# Patient Record
Sex: Female | Born: 1935 | Race: White | Hispanic: No | Smoking: Never smoker
Health system: Southern US, Community
[De-identification: ages and names within clinical notes are randomized; demographics above are authoritative.]

## PROBLEM LIST (undated history)

## (undated) DIAGNOSIS — I499 Cardiac arrhythmia, unspecified: Secondary | ICD-10-CM

## (undated) DIAGNOSIS — R778 Other specified abnormalities of plasma proteins: Secondary | ICD-10-CM

## (undated) DIAGNOSIS — Z87442 Personal history of urinary calculi: Secondary | ICD-10-CM

## (undated) DIAGNOSIS — I509 Heart failure, unspecified: Secondary | ICD-10-CM

## (undated) DIAGNOSIS — I4891 Unspecified atrial fibrillation: Principal | ICD-10-CM

## (undated) DIAGNOSIS — N289 Disorder of kidney and ureter, unspecified: Secondary | ICD-10-CM

## (undated) DIAGNOSIS — E119 Type 2 diabetes mellitus without complications: Secondary | ICD-10-CM

## (undated) DIAGNOSIS — I10 Essential (primary) hypertension: Secondary | ICD-10-CM

## (undated) DIAGNOSIS — M1990 Unspecified osteoarthritis, unspecified site: Secondary | ICD-10-CM

## (undated) DIAGNOSIS — G709 Myoneural disorder, unspecified: Secondary | ICD-10-CM

## (undated) DIAGNOSIS — R7989 Other specified abnormal findings of blood chemistry: Secondary | ICD-10-CM

## (undated) HISTORY — PX: LITHOTRIPSY: SUR834

## (undated) HISTORY — PX: PARTIAL HYSTERECTOMY: SHX80

## (undated) HISTORY — PX: CYSTOSCOPY: SHX5120

---
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
## 1995-03-23 HISTORY — PX: BACK SURGERY: SHX140

## 2000-06-20 ENCOUNTER — Inpatient Hospital Stay (HOSPITAL_COMMUNITY): Admission: EM | Admit: 2000-06-20 | Discharge: 2000-06-22 | Payer: Self-pay | Admitting: Emergency Medicine

## 2000-06-20 ENCOUNTER — Encounter: Payer: Self-pay | Admitting: Emergency Medicine

## 2000-06-22 ENCOUNTER — Encounter: Payer: Self-pay | Admitting: Internal Medicine

## 2000-07-06 ENCOUNTER — Ambulatory Visit (HOSPITAL_COMMUNITY): Admission: RE | Admit: 2000-07-06 | Discharge: 2000-07-06 | Payer: Self-pay | Admitting: Urology

## 2000-07-06 ENCOUNTER — Encounter: Payer: Self-pay | Admitting: Urology

## 2000-07-15 ENCOUNTER — Encounter: Payer: Self-pay | Admitting: Urology

## 2000-07-15 ENCOUNTER — Ambulatory Visit (HOSPITAL_COMMUNITY): Admission: RE | Admit: 2000-07-15 | Discharge: 2000-07-15 | Payer: Self-pay | Admitting: Urology

## 2000-11-22 ENCOUNTER — Ambulatory Visit (HOSPITAL_COMMUNITY): Admission: RE | Admit: 2000-11-22 | Discharge: 2000-11-22 | Payer: Self-pay | Admitting: Pulmonary Disease

## 2000-11-22 ENCOUNTER — Encounter: Payer: Self-pay | Admitting: Pulmonary Disease

## 2000-11-30 ENCOUNTER — Ambulatory Visit (HOSPITAL_COMMUNITY): Admission: RE | Admit: 2000-11-30 | Discharge: 2000-11-30 | Payer: Self-pay | Admitting: Pulmonary Disease

## 2000-11-30 ENCOUNTER — Encounter: Payer: Self-pay | Admitting: Pulmonary Disease

## 2000-12-02 ENCOUNTER — Encounter: Payer: Self-pay | Admitting: Urology

## 2000-12-02 ENCOUNTER — Encounter: Admission: RE | Admit: 2000-12-02 | Discharge: 2000-12-02 | Payer: Self-pay | Admitting: Urology

## 2000-12-07 ENCOUNTER — Ambulatory Visit (HOSPITAL_COMMUNITY): Admission: RE | Admit: 2000-12-07 | Discharge: 2000-12-07 | Payer: Self-pay | Admitting: Urology

## 2001-01-11 ENCOUNTER — Encounter: Payer: Self-pay | Admitting: Urology

## 2001-01-11 ENCOUNTER — Ambulatory Visit (HOSPITAL_COMMUNITY): Admission: RE | Admit: 2001-01-11 | Discharge: 2001-01-11 | Payer: Self-pay | Admitting: Urology

## 2001-03-28 ENCOUNTER — Encounter: Admission: RE | Admit: 2001-03-28 | Discharge: 2001-03-28 | Payer: Self-pay | Admitting: Urology

## 2001-03-28 ENCOUNTER — Encounter: Payer: Self-pay | Admitting: Urology

## 2001-04-10 ENCOUNTER — Ambulatory Visit (HOSPITAL_COMMUNITY): Admission: RE | Admit: 2001-04-10 | Discharge: 2001-04-10 | Payer: Self-pay | Admitting: Pulmonary Disease

## 2001-04-10 ENCOUNTER — Encounter: Payer: Self-pay | Admitting: Pulmonary Disease

## 2001-07-24 ENCOUNTER — Ambulatory Visit (HOSPITAL_COMMUNITY): Admission: RE | Admit: 2001-07-24 | Discharge: 2001-07-24 | Payer: Self-pay | Admitting: Pulmonary Disease

## 2001-07-24 ENCOUNTER — Encounter: Payer: Self-pay | Admitting: Pulmonary Disease

## 2002-01-22 ENCOUNTER — Ambulatory Visit (HOSPITAL_COMMUNITY): Admission: RE | Admit: 2002-01-22 | Discharge: 2002-01-22 | Payer: Self-pay | Admitting: Pulmonary Disease

## 2002-01-22 ENCOUNTER — Encounter: Payer: Self-pay | Admitting: Pulmonary Disease

## 2002-07-02 ENCOUNTER — Ambulatory Visit (HOSPITAL_COMMUNITY): Admission: RE | Admit: 2002-07-02 | Discharge: 2002-07-02 | Payer: Self-pay | Admitting: Pulmonary Disease

## 2002-07-02 ENCOUNTER — Encounter: Payer: Self-pay | Admitting: Pulmonary Disease

## 2005-01-13 ENCOUNTER — Ambulatory Visit: Payer: Self-pay | Admitting: Cardiology

## 2005-02-08 ENCOUNTER — Ambulatory Visit: Payer: Self-pay | Admitting: Cardiology

## 2005-06-11 ENCOUNTER — Ambulatory Visit (HOSPITAL_COMMUNITY): Admission: RE | Admit: 2005-06-11 | Discharge: 2005-06-11 | Payer: Self-pay | Admitting: Internal Medicine

## 2005-06-11 ENCOUNTER — Ambulatory Visit: Payer: Self-pay | Admitting: Internal Medicine

## 2010-07-29 ENCOUNTER — Encounter (HOSPITAL_BASED_OUTPATIENT_CLINIC_OR_DEPARTMENT_OTHER): Payer: Medicare Other | Admitting: Internal Medicine

## 2010-07-29 ENCOUNTER — Ambulatory Visit (HOSPITAL_COMMUNITY)
Admission: RE | Admit: 2010-07-29 | Discharge: 2010-07-29 | Disposition: A | Discharge status: Home / Self Care | Payer: Medicare Other | Source: Ambulatory Visit | Attending: Internal Medicine | Admitting: Internal Medicine

## 2010-07-29 ENCOUNTER — Other Ambulatory Visit (INDEPENDENT_AMBULATORY_CARE_PROVIDER_SITE_OTHER): Payer: Self-pay | Admitting: Internal Medicine

## 2010-07-29 DIAGNOSIS — D128 Benign neoplasm of rectum: Secondary | ICD-10-CM

## 2010-07-29 DIAGNOSIS — D126 Benign neoplasm of colon, unspecified: Secondary | ICD-10-CM | POA: Insufficient documentation

## 2010-07-29 DIAGNOSIS — I10 Essential (primary) hypertension: Secondary | ICD-10-CM | POA: Insufficient documentation

## 2010-07-29 DIAGNOSIS — Z79899 Other long term (current) drug therapy: Secondary | ICD-10-CM | POA: Insufficient documentation

## 2010-07-29 DIAGNOSIS — Z86010 Personal history of colonic polyps: Secondary | ICD-10-CM

## 2010-07-29 DIAGNOSIS — Z1211 Encounter for screening for malignant neoplasm of colon: Secondary | ICD-10-CM | POA: Insufficient documentation

## 2010-07-29 DIAGNOSIS — Z800 Family history of malignant neoplasm of digestive organs: Secondary | ICD-10-CM

## 2010-07-29 DIAGNOSIS — K648 Other hemorrhoids: Secondary | ICD-10-CM | POA: Insufficient documentation

## 2010-07-29 DIAGNOSIS — K5730 Diverticulosis of large intestine without perforation or abscess without bleeding: Secondary | ICD-10-CM | POA: Insufficient documentation

## 2010-07-29 DIAGNOSIS — E119 Type 2 diabetes mellitus without complications: Secondary | ICD-10-CM | POA: Insufficient documentation

## 2010-08-17 NOTE — Op Note (Signed)
  NAMETARICA, VANALST              ACCOUNT NO.:  [id number]  MEDICAL RECORD NO.:  [id number]           PATIENT TYPE:  O  LOCATION:  DAYP                          FACILITY:  [facility]  PHYSICIAN:  Hildred Laser, M.D.    DATE OF BIRTH:  1930/09/03  DATE OF PROCEDURE:  07/29/2010 DATE OF DISCHARGE:                              OPERATIVE REPORT   PROCEDURE:  Colonoscopy with snare polypectomy.  INDICATIONS:  Miranda Garcia is a 77 year old Caucasian Garcia who is undergoing high-risk screening colonoscopy.  Her last exam was just over 5 years ago.  Family history is significant for colon carcinoma in a brother who had surgery in his early 62s and died of unrelated causes at age 54. Procedure risks were reviewed with the patient and informed consent was obtained.  MEDICATIONS FOR CONSCIOUS SEDATION:  Demerol 50 mg IV, Versed 4 mg IV.  FINDINGS:  Procedure was performed in an endoscopy suite.  The patient's vital signs and O2 sats were monitored during the procedure and remained stable.  The patient was placed in the left lateral recumbent position. Rectal examination was performed.  No abnormalities were noted on external or digital exam.  Pentax videoscope was placed through the rectum and advanced under direct vision into sigmoid colon beyond. Preparation was excellent.  She had few scattered diverticula at sigmoid colon.  The scope was passed through the cecum which was identified by appendiceal orifice.  The area behind the valve was difficult to see and mucosa pulled with forceps in order to see this area.  Appendiceal orifice well seen and was normal.  As the scope was withdrawn, colonic mucosa was carefully examined.  There was a 7-mm sessile polyp at the sigmoid colon which elevated with saline injection and snared.  Residual polyp was coagulated with snare tip.  These pieces snared were retrieved for histologic examination.  Mucosa rest of the colon was normal. Rectal mucosa similarly was  normal.  Scope was retroflexed to examine anorectal junction and hemorrhoids noted above the dentate line with erythema.  Pictures taken for the record and the endoscope was withdrawn.  Withdrawal time was 12 minutes.  The patient tolerated the procedure well.  FINAL DIAGNOSES: 1. Examination performed to cecum. 2. A 7-mm sessile polyp snared from sigmoid colon. 3. Few scattered diverticula at sigmoid colon. 4. Internal/external hemorrhoids with inflammation.  RECOMMENDATIONS: 1. Standard instructions given.  No aspirin for 1 week. 2. High-fiber diet. 3. Anusol-HC Suppository 1 per rectum at bedtime daily for 2 weeks.  I will be contacting the patient with the results of biopsy.          ______________________________ Hildred Laser, M.D.     NR/MEDQ  D:  07/29/2010  T:  07/29/2010  Job:  [id number]  cc:   Dr. Woody Seller  Electronically Signed by Hildred Laser M.D. on 08/17/2010 [id number] PM

## 2011-01-05 ENCOUNTER — Encounter (HOSPITAL_COMMUNITY)
Admission: RE | Admit: 2011-01-05 | Discharge: 2011-01-05 | Disposition: A | Discharge status: Home / Self Care | Payer: Medicare Other | Source: Ambulatory Visit | Attending: Ophthalmology | Admitting: Ophthalmology

## 2011-01-05 ENCOUNTER — Encounter (HOSPITAL_COMMUNITY): Payer: Self-pay

## 2011-01-05 ENCOUNTER — Other Ambulatory Visit: Payer: Self-pay

## 2011-01-05 HISTORY — DX: Type 2 diabetes mellitus without complications: E11.9

## 2011-01-05 HISTORY — DX: Essential (primary) hypertension: I10

## 2011-01-05 HISTORY — DX: Unspecified osteoarthritis, unspecified site: M19.90

## 2011-01-05 LAB — BASIC METABOLIC PANEL
CO2: 26 mEq/L (ref 19–32)
Glucose, Bld: 68 mg/dL — ABNORMAL LOW (ref 70–99)
Potassium: 4.5 mEq/L (ref 3.5–5.1)
Sodium: 139 mEq/L (ref 135–145)

## 2011-01-05 LAB — CBC
HCT: 33.8 % — ABNORMAL LOW (ref 36.0–46.0)
Hemoglobin: 11 g/dL — ABNORMAL LOW (ref 12.0–15.0)
MCH: 30.6 pg (ref 26.0–34.0)
MCV: 94.2 fL (ref 78.0–100.0)
RBC: 3.59 MIL/uL — ABNORMAL LOW (ref 3.87–5.11)

## 2011-01-05 NOTE — Patient Instructions (Addendum)
20 Miranda Garcia  01/05/2011   Your procedure is scheduled on:  01/11/11  Report to [facility] at 07:30 AM.  Call this number if you have problems the morning of surgery: [phone]   Remember:   Do not eat food:After Midnight.  Do not drink clear liquids: After Midnight.  Take these medicines the morning of surgery with A SIP OF WATER: metoprolol   Do not wear jewelry, make-up or nail polish.  Do not wear lotions, powders, or perfumes. You may wear deodorant.  Do not shave 48 hours prior to surgery.  Do not bring valuables to the hospital.  Contacts, dentures or bridgework may not be worn into surgery.  Leave suitcase in the car. After surgery it may be brought to your room.  For patients admitted to the hospital, checkout time is 11:00 AM the day of discharge.   Patients discharged the day of surgery will not be allowed to drive home.  Name and phone number of your driver: driver  Special Instructions: Use eye drops as instructed.   Please read over the following fact sheets that you were given: Anesthesia Post-op Instructions   Monitored Anesthesia Care (MAC)  MAC stands for monitored anesthesia care. MAC usually means a tube is not put in your trachea (windpipe). MAC may also be called moderate sedation. MAC usually involves giving intravenous anesthetic drugs, oxygen, watching vital signs and standard patient monitoring procedures similar to those used during a general anesthetic. MAC can be done without going to the operating room. MAC is typically used for small procedures that cannot be done with only local anesthesia. MAC usually means lower doses of anesthetic drugs. The recovery period tends to be shorter. The drugs used cause a lower level of awareness. This means you are partially awake and your reflexes are intact. You may hear what is being said and feel some pressure, but should not feel pain. The drugs used may affect your ability to remember the procedure. If you have  depressed consciousness and lose some protective reflexes, this is called deep sedation. If you become unconscious and fall completely asleep, this is general anesthesia. In both deep sedation and general anesthesia, the caregivers must make sure that your airway remains open. During MAC, the sedation-trained caregivers will:  Give medications which may include:   Sedatives.   Analgesics.   Hypnotics.   Other medications which are needed to keep you comfortable, safe and secure.   Give local anesthetic to numb the procedural site.   Monitor your level of consciousness.   Monitor your blood pressure.   Monitor your heart rate and rhythm.   Monitor your respirations and oxygen levels.   Monitor your airway.   Monitor your level of pain.   Evaluate and treat problems which may occur.  Some of this information is from the [employer]. Document Released: 12/02/2004 Document Re-Released: 03/30/2009 [facility] Patient Information 2011 [place].

## 2011-01-11 ENCOUNTER — Ambulatory Visit (HOSPITAL_COMMUNITY): Payer: Medicare Other | Admitting: Anesthesiology

## 2011-01-11 ENCOUNTER — Encounter (HOSPITAL_COMMUNITY): Payer: Self-pay | Admitting: Ophthalmology

## 2011-01-11 ENCOUNTER — Encounter (HOSPITAL_COMMUNITY): Payer: Self-pay | Admitting: Anesthesiology

## 2011-01-11 ENCOUNTER — Encounter (HOSPITAL_COMMUNITY)
Admission: RE | Disposition: A | Discharge status: Home / Self Care | Payer: Self-pay | Source: Ambulatory Visit | Attending: Ophthalmology

## 2011-01-11 ENCOUNTER — Ambulatory Visit (HOSPITAL_COMMUNITY)
Admission: RE | Admit: 2011-01-11 | Discharge: 2011-01-11 | Disposition: A | Discharge status: Home / Self Care | Payer: Medicare Other | Source: Ambulatory Visit | Attending: Ophthalmology | Admitting: Ophthalmology

## 2011-01-11 DIAGNOSIS — E119 Type 2 diabetes mellitus without complications: Secondary | ICD-10-CM | POA: Insufficient documentation

## 2011-01-11 DIAGNOSIS — Z01810 Encounter for preprocedural cardiovascular examination: Secondary | ICD-10-CM | POA: Insufficient documentation

## 2011-01-11 DIAGNOSIS — I10 Essential (primary) hypertension: Secondary | ICD-10-CM | POA: Insufficient documentation

## 2011-01-11 DIAGNOSIS — Z01812 Encounter for preprocedural laboratory examination: Secondary | ICD-10-CM | POA: Insufficient documentation

## 2011-01-11 DIAGNOSIS — Z79899 Other long term (current) drug therapy: Secondary | ICD-10-CM | POA: Insufficient documentation

## 2011-01-11 DIAGNOSIS — H2589 Other age-related cataract: Secondary | ICD-10-CM | POA: Insufficient documentation

## 2011-01-11 HISTORY — PX: CATARACT EXTRACTION W/PHACO: SHX586

## 2011-01-11 SURGERY — PHACOEMULSIFICATION, CATARACT, WITH IOL INSERTION
Anesthesia: Monitor Anesthesia Care | Site: Eye | Laterality: Right | Wound class: Clean

## 2011-01-11 MED ORDER — LIDOCAINE HCL (PF) 1 % IJ SOLN
INTRAMUSCULAR | Status: DC | PRN
  Administered 2011-01-11: .5 mL

## 2011-01-11 MED ORDER — POVIDONE-IODINE 5 % OP SOLN
OPHTHALMIC | Status: DC | PRN
  Administered 2011-01-11: 1 via OPHTHALMIC

## 2011-01-11 MED ORDER — LIDOCAINE HCL 3.5 % OP GEL
1.0000 "application " | Freq: Once | OPHTHALMIC | Status: AC
  Administered 2011-01-11: 1 via OPHTHALMIC

## 2011-01-11 MED ORDER — MIDAZOLAM HCL 2 MG/2ML IJ SOLN
INTRAMUSCULAR | Status: AC
  Filled 2011-01-11: qty 2

## 2011-01-11 MED ORDER — NEOMYCIN-POLYMYXIN-DEXAMETH 0.1 % OP OINT
TOPICAL_OINTMENT | OPHTHALMIC | Status: DC | PRN
  Administered 2011-01-11: 1 via OPHTHALMIC

## 2011-01-11 MED ORDER — CYCLOPENTOLATE-PHENYLEPHRINE 0.2-1 % OP SOLN
OPHTHALMIC | Status: AC
  Administered 2011-01-11: 1 [drp] via OPHTHALMIC
  Filled 2011-01-11: qty 2

## 2011-01-11 MED ORDER — TETRACAINE HCL 0.5 % OP SOLN
OPHTHALMIC | Status: AC
  Administered 2011-01-11: 1 [drp] via OPHTHALMIC
  Filled 2011-01-11: qty 2

## 2011-01-11 MED ORDER — LACTATED RINGERS IV SOLN: INTRAVENOUS | Status: DC

## 2011-01-11 MED ORDER — PHENYLEPHRINE HCL 2.5 % OP SOLN
OPHTHALMIC | Status: AC
  Administered 2011-01-11: 1 [drp] via OPHTHALMIC
  Filled 2011-01-11: qty 2

## 2011-01-11 MED ORDER — EPINEPHRINE HCL 1 MG/ML IJ SOLN
INTRAOCULAR | Status: DC | PRN
  Administered 2011-01-11: 09:00:00

## 2011-01-11 MED ORDER — LACTATED RINGERS IV SOLN
INTRAVENOUS | Status: DC
  Administered 2011-01-11: 08:00:00 via INTRAVENOUS

## 2011-01-11 MED ORDER — BSS IO SOLN
INTRAOCULAR | Status: DC | PRN
  Administered 2011-01-11: 15 mL via OPHTHALMIC

## 2011-01-11 MED ORDER — LIDOCAINE HCL (PF) 1 % IJ SOLN
INTRAMUSCULAR | Status: AC
  Filled 2011-01-11: qty 2

## 2011-01-11 MED ORDER — CYCLOPENTOLATE-PHENYLEPHRINE 0.2-1 % OP SOLN
1.0000 [drp] | OPHTHALMIC | Status: AC
  Administered 2011-01-11 (×3): 1 [drp] via OPHTHALMIC

## 2011-01-11 MED ORDER — PHENYLEPHRINE HCL 2.5 % OP SOLN
1.0000 [drp] | OPHTHALMIC | Status: AC
  Administered 2011-01-11 (×3): 1 [drp] via OPHTHALMIC

## 2011-01-11 MED ORDER — TETRACAINE HCL 0.5 % OP SOLN
1.0000 [drp] | OPHTHALMIC | Status: AC
  Administered 2011-01-11 (×3): 1 [drp] via OPHTHALMIC

## 2011-01-11 MED ORDER — LIDOCAINE 3.5 % OP GEL OPTIME - NO CHARGE
OPHTHALMIC | Status: DC | PRN
  Administered 2011-01-11: 1 [drp] via OPHTHALMIC

## 2011-01-11 MED ORDER — PROVISC 10 MG/ML IO SOLN
INTRAOCULAR | Status: DC | PRN
  Administered 2011-01-11: 8.5 mg via OPHTHALMIC

## 2011-01-11 MED ORDER — MIDAZOLAM HCL 2 MG/2ML IJ SOLN
1.0000 mg | INTRAMUSCULAR | Status: DC | PRN
  Administered 2011-01-11: 2 mg via INTRAVENOUS

## 2011-01-11 MED ORDER — EPINEPHRINE HCL 1 MG/ML IJ SOLN
INTRAMUSCULAR | Status: AC
  Filled 2011-01-11: qty 1

## 2011-01-11 MED ORDER — LIDOCAINE HCL 3.5 % OP GEL
OPHTHALMIC | Status: AC
  Administered 2011-01-11: 1 via OPHTHALMIC
  Filled 2011-01-11: qty 5

## 2011-01-11 MED ORDER — NEOMYCIN-POLYMYXIN-DEXAMETH 3.5-10000-0.1 OP OINT
TOPICAL_OINTMENT | OPHTHALMIC | Status: AC
  Filled 2011-01-11: qty 3.5

## 2011-01-11 MED ORDER — FENTANYL CITRATE 0.05 MG/ML IJ SOLN: 25.0000 ug | INTRAMUSCULAR | Status: DC | PRN

## 2011-01-11 MED ORDER — ONDANSETRON HCL 4 MG/2ML IJ SOLN: 4.0000 mg | Freq: Once | INTRAMUSCULAR | Status: DC | PRN

## 2011-01-11 SURGICAL SUPPLY — 34 items
CAPSULAR TENSION RING-AMO (OPHTHALMIC RELATED) IMPLANT
CLOTH BEACON ORANGE TIMEOUT ST (SAFETY) ×1 IMPLANT
DUOVISC SYSTEM (INTRAOCULAR LENS)
EYE SHIELD UNIVERSAL CLEAR (GAUZE/BANDAGES/DRESSINGS) ×1 IMPLANT
GLOVE BIO SURGEON STRL SZ 6.5 (GLOVE) IMPLANT
GLOVE BIOGEL PI IND STRL 6.5 (GLOVE) IMPLANT
GLOVE BIOGEL PI IND STRL 7.0 (GLOVE) IMPLANT
GLOVE BIOGEL PI IND STRL 7.5 (GLOVE) IMPLANT
GLOVE BIOGEL PI INDICATOR 6.5 (GLOVE)
GLOVE BIOGEL PI INDICATOR 7.0 (GLOVE) ×1
GLOVE BIOGEL PI INDICATOR 7.5 (GLOVE)
GLOVE ECLIPSE 6.5 STRL STRAW (GLOVE) IMPLANT
GLOVE ECLIPSE 7.0 STRL STRAW (GLOVE) IMPLANT
GLOVE ECLIPSE 7.5 STRL STRAW (GLOVE) IMPLANT
GLOVE EXAM NITRILE LRG STRL (GLOVE) ×1 IMPLANT
GLOVE EXAM NITRILE MD LF STRL (GLOVE) IMPLANT
GLOVE SKINSENSE NS SZ6.5 (GLOVE)
GLOVE SKINSENSE NS SZ7.0 (GLOVE)
GLOVE SKINSENSE STRL SZ6.5 (GLOVE) IMPLANT
GLOVE SKINSENSE STRL SZ7.0 (GLOVE) IMPLANT
KIT VITRECTOMY (OPHTHALMIC RELATED) IMPLANT
PAD ARMBOARD 7.5X6 YLW CONV (MISCELLANEOUS) ×1 IMPLANT
PROC W NO LENS (INTRAOCULAR LENS)
PROC W SPEC LENS (INTRAOCULAR LENS)
PROCESS W NO LENS (INTRAOCULAR LENS) IMPLANT
PROCESS W SPEC LENS (INTRAOCULAR LENS) IMPLANT
RING MALYGIN (MISCELLANEOUS) IMPLANT
SIGHTPATH CAT PROC W REG LENS (Ophthalmic Related) ×2 IMPLANT
SYR TB 1ML LL NO SAFETY (SYRINGE) ×1 IMPLANT
SYSTEM DUOVISC (INTRAOCULAR LENS) IMPLANT
TAPE SURG TRANSPORE 1 IN (GAUZE/BANDAGES/DRESSINGS) IMPLANT
TAPE SURGICAL TRANSPORE 1 IN (GAUZE/BANDAGES/DRESSINGS) ×1
VISCOELASTIC ADDITIONAL (OPHTHALMIC RELATED) IMPLANT
WATER STERILE IRR 250ML POUR (IV SOLUTION) ×1 IMPLANT

## 2011-01-11 NOTE — H&P (Signed)
I have reviewed the H&P, the patient was re-examined, and I have identified no interval changes in medical condition and plan of care since the history and physical of record  

## 2011-01-11 NOTE — Anesthesia Preprocedure Evaluation (Addendum)
Anesthesia Evaluation  Patient identified by MRN, date of birth, ID band Patient awake  General Assessment Comment  Reviewed: Allergy & Precautions, H&P , NPO status , Patient's Chart, lab work & pertinent test results, reviewed documented beta blocker date and time   Airway Mallampati: III TM Distance: <3 FB     Dental  (+) Teeth Intact   Pulmonary  clear to auscultation        Cardiovascular hypertension, Pt. on medications and Pt. on home beta blockers Regular     Neuro/Psych    GI/Hepatic   Endo/Other  Diabetes mellitus-, Well Controlled, Type 2, Oral Hypoglycemic Agents  Renal/GU      Musculoskeletal   Abdominal   Peds  Hematology   Anesthesia Other Findings   Reproductive/Obstetrics                          Anesthesia Physical Anesthesia Plan  ASA: II  Anesthesia Plan: MAC   Post-op Pain Management:    Induction:   Airway Management Planned: Nasal Cannula  Additional Equipment:   Intra-op Plan:   Post-operative Plan:   Informed Consent: I have reviewed the patients History and Physical, chart, labs and discussed the procedure including the risks, benefits and alternatives for the proposed anesthesia with the patient or authorized representative who has indicated his/her understanding and acceptance.     Plan Discussed with:   Anesthesia Plan Comments:         Anesthesia Quick Evaluation

## 2011-01-11 NOTE — Op Note (Signed)
NAMEWANDY, READ NO.:  [id number]  MEDICAL RECORD NO.:  [id number]  LOCATION:  APPO                          FACILITY:  [facility]  PHYSICIAN:  Richardo Hanks, MD       DATE OF BIRTH:  [birth year]  DATE OF PROCEDURE:  01/11/2011 DATE OF DISCHARGE:  01/11/2011                              OPERATIVE REPORT   PREOPERATIVE DIAGNOSIS:  Combined cataract, right eye.  Diagnosis code 366.19.  POSTOPERATIVE DIAGNOSIS:  Combined cataract, right eye.  Diagnosis code 366.19.  OPERATION PERFORMED:  Phacoemulsification with posterior chamber intraocular lens implantation, right eye.  SURGEON:  Franky Macho. Raelie Lohr, MD  ANESTHESIA:  General endotracheal anesthesia.  OPERATIVE SUMMARY:  In the preoperative area, dilating drops were placed into the right eye.  The patient was then brought into the operating room where she was placed under general anesthesia.  The eye was then prepped and draped.  Beginning with a 75 blade, a paracentesis port was made at the surgeon's 2 o'clock position.  The anterior chamber was then filled with a 1% nonpreserved lidocaine solution with epinephrine.  This was followed by Viscoat to deepen the chamber.  A small fornix-based peritomy was performed superiorly.  Next, a single iris hook was placed through the limbus superiorly.  A 2.4-mm keratome blade was then used to make a clear corneal incision over the iris hook.  A bent cystotome needle and Utrata forceps were used to create a continuous tear capsulotomy.  Hydrodissection was performed using balanced salt solution on a fine cannula.  The lens nucleus was then removed using phacoemulsification in a quadrant cracking technique.  The cortical material was then removed with irrigation and aspiration.  The capsular bag and anterior chamber were refilled with Provisc.  The wound was widened to approximately 3 mm and a posterior chamber intraocular lens was placed into the capsular bag without  difficulty using an [employer] lens injecting system.  A single 10-0 nylon suture was then used to close the incision as well as stromal hydration.  The Provisc was removed from the anterior chamber and capsular bag with irrigation and aspiration.  At this point, the wounds were tested for leak, which were negative.  The anterior chamber remained deep and stable.  The patient tolerated the procedure well.  There were no operative complications, and she awoke from general anesthesia without problem.  No surgical specimens.  Prosthetic device used is a Lenstec posterior chamber lens, model Softec HD, power of 25.0, serial number is [id number].          ______________________________ Richardo Hanks, MD     KEH/MEDQ  D:  01/11/2011  T:  01/11/2011  Job:  [id number]

## 2011-01-11 NOTE — Anesthesia Postprocedure Evaluation (Signed)
  Anesthesia Post-op Note  Patient: Miranda Garcia  Procedure(s) Performed:  CATARACT EXTRACTION PHACO AND INTRAOCULAR LENS PLACEMENT (IOC) - CDE 19.32  Patient Location: PACU and Short Stay  Anesthesia Type: MAC  Level of Consciousness: awake  Airway and Oxygen Therapy: Patient Spontanous Breathing  Post-op Pain: none  Post-op Assessment: Post-op Vital signs reviewed  Post-op Vital Signs: Reviewed and stable  Complications: No apparent anesthesia complications

## 2011-01-11 NOTE — Brief Op Note (Signed)
Pre-Op Dx: Cataract OD Post-Op Dx: Cataract OD Surgeon: Hani Patnode Anesthesia: Topical with MAC Implant: Lenstec, Model Softec HD Blood Loss: None Specimen: None Complications: None 

## 2011-01-11 NOTE — Transfer of Care (Signed)
Immediate Anesthesia Transfer of Care Note  Patient: Miranda Garcia  Procedure(s) Performed:  CATARACT EXTRACTION PHACO AND INTRAOCULAR LENS PLACEMENT (IOC) - CDE 19.32  Patient Location: PACU and Short Stay  Anesthesia Type: MAC  Level of Consciousness: awake  Airway & Oxygen Therapy: Patient Spontanous Breathing  Post-op Assessment: Report given to PACU RN  Post vital signs: Reviewed and stable  Complications: No apparent anesthesia complications

## 2011-01-15 ENCOUNTER — Encounter (HOSPITAL_COMMUNITY): Payer: Self-pay | Admitting: Ophthalmology

## 2011-01-20 ENCOUNTER — Encounter (HOSPITAL_COMMUNITY)
Admission: RE | Admit: 2011-01-20 | Discharge: 2011-01-20 | Disposition: A | Discharge status: Home / Self Care | Payer: Medicare Other | Source: Ambulatory Visit | Attending: Ophthalmology | Admitting: Ophthalmology

## 2011-01-21 ENCOUNTER — Encounter (HOSPITAL_COMMUNITY): Payer: Self-pay | Admitting: Anesthesiology

## 2011-01-21 ENCOUNTER — Ambulatory Visit (HOSPITAL_COMMUNITY)
Admission: RE | Admit: 2011-01-21 | Discharge: 2011-01-21 | Disposition: A | Discharge status: Home / Self Care | Payer: Medicare Other | Source: Ambulatory Visit | Attending: Ophthalmology | Admitting: Ophthalmology

## 2011-01-21 ENCOUNTER — Encounter (HOSPITAL_COMMUNITY): Payer: Self-pay | Admitting: *Deleted

## 2011-01-21 ENCOUNTER — Ambulatory Visit (HOSPITAL_COMMUNITY): Payer: Medicare Other | Admitting: Anesthesiology

## 2011-01-21 ENCOUNTER — Encounter (HOSPITAL_COMMUNITY)
Admission: RE | Disposition: A | Discharge status: Home / Self Care | Payer: Self-pay | Source: Ambulatory Visit | Attending: Ophthalmology

## 2011-01-21 DIAGNOSIS — E119 Type 2 diabetes mellitus without complications: Secondary | ICD-10-CM | POA: Insufficient documentation

## 2011-01-21 DIAGNOSIS — H2589 Other age-related cataract: Secondary | ICD-10-CM | POA: Insufficient documentation

## 2011-01-21 DIAGNOSIS — I10 Essential (primary) hypertension: Secondary | ICD-10-CM | POA: Insufficient documentation

## 2011-01-21 DIAGNOSIS — Z79899 Other long term (current) drug therapy: Secondary | ICD-10-CM | POA: Insufficient documentation

## 2011-01-21 DIAGNOSIS — Z01812 Encounter for preprocedural laboratory examination: Secondary | ICD-10-CM | POA: Insufficient documentation

## 2011-01-21 HISTORY — PX: CATARACT EXTRACTION W/PHACO: SHX586

## 2011-01-21 LAB — GLUCOSE, CAPILLARY

## 2011-01-21 SURGERY — PHACOEMULSIFICATION, CATARACT, WITH IOL INSERTION
Anesthesia: Monitor Anesthesia Care | Site: Eye | Laterality: Left | Wound class: Clean

## 2011-01-21 MED ORDER — LIDOCAINE HCL 1 % IJ SOLN
INTRAMUSCULAR | Status: DC | PRN
  Administered 2011-01-21 (×2): 25 mg via INTRADERMAL

## 2011-01-21 MED ORDER — BSS IO SOLN
INTRAOCULAR | Status: DC | PRN
  Administered 2011-01-21: 15 mL via INTRAOCULAR

## 2011-01-21 MED ORDER — PHENYLEPHRINE HCL 2.5 % OP SOLN
1.0000 [drp] | OPHTHALMIC | Status: AC
  Administered 2011-01-21 (×3): 1 [drp] via OPHTHALMIC

## 2011-01-21 MED ORDER — PHENYLEPHRINE HCL 2.5 % OP SOLN
OPHTHALMIC | Status: AC
  Administered 2011-01-21: 1 [drp] via OPHTHALMIC
  Filled 2011-01-21: qty 2

## 2011-01-21 MED ORDER — EPINEPHRINE HCL 1 MG/ML IJ SOLN
INTRAMUSCULAR | Status: AC
  Filled 2011-01-21: qty 1

## 2011-01-21 MED ORDER — MIDAZOLAM HCL 2 MG/2ML IJ SOLN
INTRAMUSCULAR | Status: AC
  Administered 2011-01-21: 2 mg via INTRAVENOUS
  Filled 2011-01-21: qty 2

## 2011-01-21 MED ORDER — LIDOCAINE HCL 3.5 % OP GEL
OPHTHALMIC | Status: AC
  Administered 2011-01-21: 1 via OPHTHALMIC
  Filled 2011-01-21: qty 5

## 2011-01-21 MED ORDER — NEOMYCIN-POLYMYXIN-DEXAMETH 3.5-10000-0.1 OP OINT
TOPICAL_OINTMENT | OPHTHALMIC | Status: AC
  Filled 2011-01-21: qty 3.5

## 2011-01-21 MED ORDER — CYCLOPENTOLATE-PHENYLEPHRINE 0.2-1 % OP SOLN
OPHTHALMIC | Status: AC
  Administered 2011-01-21: 1 [drp] via OPHTHALMIC
  Filled 2011-01-21: qty 2

## 2011-01-21 MED ORDER — LACTATED RINGERS IV SOLN
INTRAVENOUS | Status: DC
  Administered 2011-01-21: 14:00:00 via INTRAVENOUS

## 2011-01-21 MED ORDER — LIDOCAINE HCL (PF) 1 % IJ SOLN
INTRAMUSCULAR | Status: AC
  Filled 2011-01-21: qty 2

## 2011-01-21 MED ORDER — MIDAZOLAM HCL 2 MG/2ML IJ SOLN
INTRAMUSCULAR | Status: AC
  Filled 2011-01-21: qty 2

## 2011-01-21 MED ORDER — PROVISC 10 MG/ML IO SOLN
INTRAOCULAR | Status: DC | PRN
  Administered 2011-01-21: 8.5 mg via INTRAOCULAR

## 2011-01-21 MED ORDER — TETRACAINE HCL 0.5 % OP SOLN
OPHTHALMIC | Status: AC
  Administered 2011-01-21: 1 [drp] via OPHTHALMIC
  Filled 2011-01-21: qty 2

## 2011-01-21 MED ORDER — TETRACAINE HCL 0.5 % OP SOLN
1.0000 [drp] | OPHTHALMIC | Status: AC
  Administered 2011-01-21 (×3): 1 [drp] via OPHTHALMIC

## 2011-01-21 MED ORDER — LIDOCAINE 3.5 % OP GEL OPTIME - NO CHARGE
OPHTHALMIC | Status: DC | PRN
Start: 2011-01-21 — End: 2011-01-21
  Administered 2011-01-21: 1 [drp] via OPHTHALMIC

## 2011-01-21 MED ORDER — LIDOCAINE HCL (PF) 1 % IJ SOLN
INTRAMUSCULAR | Status: AC
  Filled 2011-01-21: qty 5

## 2011-01-21 MED ORDER — MIDAZOLAM HCL 2 MG/2ML IJ SOLN
1.0000 mg | INTRAMUSCULAR | Status: DC | PRN
  Administered 2011-01-21 (×2): 2 mg via INTRAVENOUS

## 2011-01-21 MED ORDER — LIDOCAINE HCL (PF) 1 % IJ SOLN
INTRAMUSCULAR | Status: DC | PRN
Start: 2011-01-21 — End: 2011-01-21
  Administered 2011-01-21: .3 mL

## 2011-01-21 MED ORDER — EPINEPHRINE HCL 1 MG/ML IJ SOLN
INTRAOCULAR | Status: DC | PRN
  Administered 2011-01-21: 14:00:00

## 2011-01-21 MED ORDER — LIDOCAINE HCL 3.5 % OP GEL
1.0000 "application " | Freq: Once | OPHTHALMIC | Status: AC
  Administered 2011-01-21: 1 via OPHTHALMIC

## 2011-01-21 MED ORDER — CYCLOPENTOLATE-PHENYLEPHRINE 0.2-1 % OP SOLN
1.0000 [drp] | OPHTHALMIC | Status: AC
  Administered 2011-01-21 (×3): 1 [drp] via OPHTHALMIC

## 2011-01-21 MED ORDER — POVIDONE-IODINE 5 % OP SOLN
OPHTHALMIC | Status: DC | PRN
  Administered 2011-01-21: 1 via OPHTHALMIC

## 2011-01-21 SURGICAL SUPPLY — 34 items
CAPSULAR TENSION RING-AMO (OPHTHALMIC RELATED) IMPLANT
CLOTH BEACON ORANGE TIMEOUT ST (SAFETY) ×1 IMPLANT
DUOVISC SYSTEM (INTRAOCULAR LENS)
EYE SHIELD UNIVERSAL CLEAR (GAUZE/BANDAGES/DRESSINGS) ×1 IMPLANT
GLOVE BIO SURGEON STRL SZ 6.5 (GLOVE) IMPLANT
GLOVE BIOGEL PI IND STRL 6.5 (GLOVE) IMPLANT
GLOVE BIOGEL PI IND STRL 7.0 (GLOVE) IMPLANT
GLOVE BIOGEL PI IND STRL 7.5 (GLOVE) IMPLANT
GLOVE BIOGEL PI INDICATOR 6.5 (GLOVE) ×1
GLOVE BIOGEL PI INDICATOR 7.0 (GLOVE)
GLOVE BIOGEL PI INDICATOR 7.5 (GLOVE)
GLOVE ECLIPSE 6.5 STRL STRAW (GLOVE) IMPLANT
GLOVE ECLIPSE 7.0 STRL STRAW (GLOVE) IMPLANT
GLOVE ECLIPSE 7.5 STRL STRAW (GLOVE) IMPLANT
GLOVE EXAM NITRILE LRG STRL (GLOVE) ×1 IMPLANT
GLOVE EXAM NITRILE MD LF STRL (GLOVE) IMPLANT
GLOVE SKINSENSE NS SZ6.5 (GLOVE)
GLOVE SKINSENSE NS SZ7.0 (GLOVE)
GLOVE SKINSENSE STRL SZ6.5 (GLOVE) IMPLANT
GLOVE SKINSENSE STRL SZ7.0 (GLOVE) IMPLANT
KIT VITRECTOMY (OPHTHALMIC RELATED) IMPLANT
PAD ARMBOARD 7.5X6 YLW CONV (MISCELLANEOUS) ×1 IMPLANT
PROC W NO LENS (INTRAOCULAR LENS)
PROC W SPEC LENS (INTRAOCULAR LENS)
PROCESS W NO LENS (INTRAOCULAR LENS) IMPLANT
PROCESS W SPEC LENS (INTRAOCULAR LENS) IMPLANT
RING MALYGIN (MISCELLANEOUS) IMPLANT
SIGHTPATH CAT PROC W REG LENS (Ophthalmic Related) ×2 IMPLANT
SYR TB 1ML LL NO SAFETY (SYRINGE) ×1 IMPLANT
SYSTEM DUOVISC (INTRAOCULAR LENS) IMPLANT
TAPE SURG TRANSPORE 1 IN (GAUZE/BANDAGES/DRESSINGS) IMPLANT
TAPE SURGICAL TRANSPORE 1 IN (GAUZE/BANDAGES/DRESSINGS) ×1
VISCOELASTIC ADDITIONAL (OPHTHALMIC RELATED) IMPLANT
WATER STERILE IRR 250ML POUR (IV SOLUTION) ×1 IMPLANT

## 2011-01-21 NOTE — Anesthesia Postprocedure Evaluation (Signed)
  Anesthesia Post-op Note  Patient: Miranda Garcia  Procedure(s) Performed:  CATARACT EXTRACTION PHACO AND INTRAOCULAR LENS PLACEMENT (IOC) - CDE: 21.19  Patient Location: PACU and Short Stay  Anesthesia Type: MAC  Level of Consciousness: awake, alert , oriented and patient cooperative  Airway and Oxygen Therapy: Patient Spontanous Breathing  Post-op Pain: none  Post-op Assessment: Post-op Vital signs reviewed, Patient's Cardiovascular Status Stable, Respiratory Function Stable, Patent Airway and No signs of Nausea or vomiting  Post-op Vital Signs: Reviewed and stable  Complications: No apparent anesthesia complications

## 2011-01-21 NOTE — Anesthesia Preprocedure Evaluation (Addendum)
Anesthesia Evaluation  Patient identified by MRN, date of birth, ID band Patient awake  General Assessment Comment  Reviewed: Allergy & Precautions, H&P , NPO status , Patient's Chart, lab work & pertinent test results, reviewed documented beta blocker date and time   Airway Mallampati: III TM Distance: <3 FB     Dental  (+) Teeth Intact   Pulmonary  clear to auscultation        Cardiovascular hypertension, Pt. on medications and Pt. on home beta blockers Regular     Neuro/Psych    GI/Hepatic   Endo/Other  Diabetes mellitus-, Well Controlled, Type 2, Oral Hypoglycemic Agents  Renal/GU      Musculoskeletal   Abdominal   Peds  Hematology   Anesthesia Other Findings   Reproductive/Obstetrics                           Anesthesia Physical Anesthesia Plan  ASA: II  Anesthesia Plan: MAC   Post-op Pain Management:    Induction:   Airway Management Planned: Nasal Cannula  Additional Equipment:   Intra-op Plan:   Post-operative Plan:   Informed Consent: I have reviewed the patients History and Physical, chart, labs and discussed the procedure including the risks, benefits and alternatives for the proposed anesthesia with the patient or authorized representative who has indicated his/her understanding and acceptance.     Plan Discussed with:   Anesthesia Plan Comments:         Anesthesia Quick Evaluation

## 2011-01-21 NOTE — H&P (Signed)
I have reviewed the H&P, the patient was re-examined, and I have identified no interval changes in medical condition and plan of care since the history and physical of record  

## 2011-01-21 NOTE — Transfer of Care (Signed)
Immediate Anesthesia Transfer of Care Note  Patient: Miranda Garcia  Procedure(s) Performed:  CATARACT EXTRACTION PHACO AND INTRAOCULAR LENS PLACEMENT (IOC) - CDE: 21.19  Patient Location: PACU and Short Stay  Anesthesia Type: MAC  Level of Consciousness: awake, alert , oriented and patient cooperative  Airway & Oxygen Therapy: Patient Spontanous Breathing  Post-op Assessment: Report given to PACU RN, Post -op Vital signs reviewed and stable and Patient moving all extremities  Post vital signs: Reviewed and stable  Complications: No apparent anesthesia complications

## 2011-01-21 NOTE — Brief Op Note (Signed)
Pre-Op Dx: Cataract OS Post-Op Dx: Cataract OS Surgeon: Tonny Branch Anesthesia: Topical with MAC Implant: Lenstec, Model Softec HD Specimen: None Complications: None

## 2011-01-22 MED ORDER — NEOMYCIN-POLYMYXIN-DEXAMETH 0.1 % OP OINT
TOPICAL_OINTMENT | OPHTHALMIC | Status: AC | PRN
  Administered 2011-01-21: 1 via OPHTHALMIC

## 2011-01-22 NOTE — Op Note (Signed)
[name], Miranda Garcia NO.:  [id number]  MEDICAL RECORD NO.:  [id number]  LOCATION:  APPO                          FACILITY:  [facility]  PHYSICIAN:  Richardo Hanks, MD       DATE OF BIRTH:  December 05, 1930  DATE OF PROCEDURE:  01/21/2011 DATE OF DISCHARGE:  01/21/2011                              OPERATIVE REPORT   PREOPERATIVE DIAGNOSIS:  Combined cataract, left eye.  Diagnosis code 366.19.  POSTOPERATIVE DIAGNOSIS:  Combined cataract, left eye.  Diagnosis code 366.19.  OPERATION PERFORMED:  Phacoemulsification with posterior chamber intraocular lens implantation, left eye.  SURGEON:  Richardo Hanks, MD.  ANESTHESIA:  General endotracheal anesthesia.  OPERATIVE SUMMARY:  In the preoperative area, dilating drops were placed into the left eye.  The patient was then brought into the operating room where she was placed under general anesthesia.  The eye was then prepped and draped.  Beginning with a 75 blade, a paracentesis port was made at the surgeon's 2 o'clock position.  The anterior chamber was then filled with a 1% nonpreserved lidocaine solution with epinephrine.  This was followed by Viscoat to deepen the chamber.  A small fornix-based peritomy was performed superiorly.  Next, a single iris hook was placed through the limbus superiorly.  A 2.4-mm keratome blade was then used to make a clear corneal incision over the iris hook.  A bent cystotome needle and Utrata forceps were used to create a continuous tear capsulotomy.  Hydrodissection was performed using balanced salt solution on a fine cannula.  The lens nucleus was then removed using phacoemulsification in a quadrant cracking technique.  The cortical material was then removed with irrigation and aspiration.  The capsular bag and anterior chamber were refilled with Provisc.  The wound was widened to approximately 3 mm and a posterior chamber intraocular lens was placed into the capsular bag without difficulty  using an [employer] lens injecting system.  A single 10-0 nylon suture was then used to close the incision as well as stromal hydration.  The Provisc was removed from the anterior chamber and capsular bag with irrigation and aspiration.  At this point, the wounds were tested for leak, which were negative.  The anterior chamber remained deep and stable.  The patient tolerated the procedure well.  There were no operative complications, and she awoke from general anesthesia without problem.  No surgical specimens.  Prosthetic device used is a Lenstec posterior chamber lens, model Softec HD, power of 25.5, serial number is [id number].          ______________________________ Richardo Hanks, MD     KEH/MEDQ  D:  01/21/2011  T:  01/22/2011  Job:  [id number]

## 2011-01-27 ENCOUNTER — Encounter (HOSPITAL_COMMUNITY): Payer: Self-pay | Admitting: Ophthalmology

## 2012-02-25 ENCOUNTER — Inpatient Hospital Stay (HOSPITAL_COMMUNITY)
Admission: EM | Admit: 2012-02-25 | Discharge: 2012-02-29 | DRG: 281 | Disposition: A | Discharge status: Home / Self Care | Payer: Medicare Other | Attending: Internal Medicine | Admitting: Internal Medicine

## 2012-02-25 ENCOUNTER — Emergency Department (HOSPITAL_COMMUNITY): Payer: Medicare Other

## 2012-02-25 ENCOUNTER — Encounter (HOSPITAL_COMMUNITY): Payer: Self-pay | Admitting: Emergency Medicine

## 2012-02-25 DIAGNOSIS — N179 Acute kidney failure, unspecified: Secondary | ICD-10-CM

## 2012-02-25 DIAGNOSIS — R7989 Other specified abnormal findings of blood chemistry: Secondary | ICD-10-CM

## 2012-02-25 DIAGNOSIS — E119 Type 2 diabetes mellitus without complications: Secondary | ICD-10-CM

## 2012-02-25 DIAGNOSIS — R079 Chest pain, unspecified: Secondary | ICD-10-CM

## 2012-02-25 DIAGNOSIS — I5032 Chronic diastolic (congestive) heart failure: Secondary | ICD-10-CM

## 2012-02-25 DIAGNOSIS — I509 Heart failure, unspecified: Secondary | ICD-10-CM

## 2012-02-25 DIAGNOSIS — D72829 Elevated white blood cell count, unspecified: Secondary | ICD-10-CM

## 2012-02-25 DIAGNOSIS — I214 Non-ST elevation (NSTEMI) myocardial infarction: Secondary | ICD-10-CM | POA: Diagnosis present

## 2012-02-25 DIAGNOSIS — I4891 Unspecified atrial fibrillation: Principal | ICD-10-CM

## 2012-02-25 DIAGNOSIS — D649 Anemia, unspecified: Secondary | ICD-10-CM | POA: Diagnosis present

## 2012-02-25 DIAGNOSIS — N183 Chronic kidney disease, stage 3 unspecified: Secondary | ICD-10-CM

## 2012-02-25 DIAGNOSIS — N189 Chronic kidney disease, unspecified: Secondary | ICD-10-CM

## 2012-02-25 DIAGNOSIS — D638 Anemia in other chronic diseases classified elsewhere: Secondary | ICD-10-CM

## 2012-02-25 DIAGNOSIS — N184 Chronic kidney disease, stage 4 (severe): Secondary | ICD-10-CM | POA: Diagnosis present

## 2012-02-25 HISTORY — DX: Other specified abnormalities of plasma proteins: R77.8

## 2012-02-25 HISTORY — DX: Other specified abnormal findings of blood chemistry: R79.89

## 2012-02-25 HISTORY — DX: Unspecified atrial fibrillation: I48.91

## 2012-02-25 LAB — CBC WITH DIFFERENTIAL/PLATELET
Eosinophils Relative: 0 % (ref 0–5)
HCT: 32.7 % — ABNORMAL LOW (ref 36.0–46.0)
Hemoglobin: 11 g/dL — ABNORMAL LOW (ref 12.0–15.0)
Lymphocytes Relative: 12 % (ref 12–46)
MCHC: 33.6 g/dL (ref 30.0–36.0)
MCV: 92.1 fL (ref 78.0–100.0)
Monocytes Absolute: 1.2 10*3/uL — ABNORMAL HIGH (ref 0.1–1.0)
Monocytes Relative: 10 % (ref 3–12)
Neutro Abs: 9.4 10*3/uL — ABNORMAL HIGH (ref 1.7–7.7)
RDW: 13.6 % (ref 11.5–15.5)
WBC: 12 10*3/uL — ABNORMAL HIGH (ref 4.0–10.5)

## 2012-02-25 LAB — BASIC METABOLIC PANEL
BUN: 32 mg/dL — ABNORMAL HIGH (ref 6–23)
CO2: 23 mEq/L (ref 19–32)
Chloride: 100 mEq/L (ref 96–112)
Creatinine, Ser: 1.64 mg/dL — ABNORMAL HIGH (ref 0.50–1.10)
GFR calc Af Amer: 33 mL/min — ABNORMAL LOW (ref >90)
Potassium: 4.3 mEq/L (ref 3.5–5.1)

## 2012-02-25 MED ORDER — METOPROLOL TARTRATE 50 MG PO TABS
50.0000 mg | ORAL_TABLET | Freq: Two times a day (BID) | ORAL | Status: DC
  Administered 2012-02-26 – 2012-02-29 (×8): 50 mg via ORAL
  Filled 2012-02-25 (×10): qty 1

## 2012-02-25 MED ORDER — HEPARIN BOLUS VIA INFUSION
4000.0000 [IU] | Freq: Once | INTRAVENOUS | Status: AC
  Administered 2012-02-25: 4000 [IU] via INTRAVENOUS

## 2012-02-25 MED ORDER — HEPARIN (PORCINE) IN NACL 100-0.45 UNIT/ML-% IJ SOLN
1450.0000 [IU]/h | INTRAMUSCULAR | Status: DC
  Administered 2012-02-25: 1550 [IU]/h via INTRAVENOUS
  Administered 2012-02-26 – 2012-02-29 (×5): 1450 [IU]/h via INTRAVENOUS
  Filled 2012-02-25 (×8): qty 250

## 2012-02-25 MED ORDER — HYDROCODONE-ACETAMINOPHEN 5-325 MG PO TABS: 1.0000 | ORAL_TABLET | ORAL | Status: DC | PRN

## 2012-02-25 MED ORDER — ONDANSETRON HCL 4 MG/2ML IJ SOLN: 4.0000 mg | Freq: Four times a day (QID) | INTRAMUSCULAR | Status: DC | PRN

## 2012-02-25 MED ORDER — SODIUM CHLORIDE 0.9 % IV SOLN
INTRAVENOUS | Status: AC
  Administered 2012-02-26: via INTRAVENOUS

## 2012-02-25 MED ORDER — DILTIAZEM HCL 25 MG/5ML IV SOLN
20.0000 mg | Freq: Once | INTRAVENOUS | Status: AC
  Administered 2012-02-25: 20 mg via INTRAVENOUS
  Filled 2012-02-25: qty 5

## 2012-02-25 MED ORDER — ONDANSETRON HCL 4 MG PO TABS: 4.0000 mg | ORAL_TABLET | Freq: Four times a day (QID) | ORAL | Status: DC | PRN

## 2012-02-25 MED ORDER — LINAGLIPTIN 5 MG PO TABS
5.0000 mg | ORAL_TABLET | Freq: Every day | ORAL | Status: DC
  Administered 2012-02-26 – 2012-02-29 (×4): 5 mg via ORAL
  Filled 2012-02-25 (×4): qty 1

## 2012-02-25 MED ORDER — ONDANSETRON HCL 4 MG/2ML IJ SOLN: 4.0000 mg | Freq: Three times a day (TID) | INTRAMUSCULAR | Status: DC | PRN

## 2012-02-25 MED ORDER — SODIUM CHLORIDE 0.9 % IV BOLUS (SEPSIS)
1000.0000 mL | Freq: Once | INTRAVENOUS | Status: AC
  Administered 2012-02-25: 1000 mL via INTRAVENOUS

## 2012-02-25 MED ORDER — GLIMEPIRIDE 4 MG PO TABS
4.0000 mg | ORAL_TABLET | Freq: Two times a day (BID) | ORAL | Status: DC
  Administered 2012-02-26 – 2012-02-29 (×8): 4 mg via ORAL
  Filled 2012-02-25 (×10): qty 1

## 2012-02-25 MED ORDER — DILTIAZEM HCL 100 MG IV SOLR
5.0000 mg/h | Freq: Once | INTRAVENOUS | Status: AC
  Administered 2012-02-25: 5 mg/h via INTRAVENOUS
  Filled 2012-02-25: qty 100

## 2012-02-25 MED ORDER — PIOGLITAZONE HCL 15 MG PO TABS
15.0000 mg | ORAL_TABLET | Freq: Every day | ORAL | Status: DC
  Administered 2012-02-26 – 2012-02-28 (×3): 15 mg via ORAL
  Filled 2012-02-25 (×3): qty 1

## 2012-02-25 MED ORDER — SODIUM CHLORIDE 0.9 % IJ SOLN: 3.0000 mL | Freq: Two times a day (BID) | INTRAMUSCULAR | Status: DC

## 2012-02-25 NOTE — ED Notes (Signed)
Dr. Nanavati at bedside 

## 2012-02-25 NOTE — Progress Notes (Signed)
ANTICOAGULATION CONSULT NOTE - Initial Consult  Pharmacy Consult for Heparin  Indication: atrial fibrillation  Allergies  Allergen Reactions  . Penicillins Rash    Patient Measurements: Height: 5\' 3"  (160 cm) Weight: 208 lb (94.348 kg) IBW/kg (Calculated) : 52.4  Heparin Dosing Weight  Vital Signs: Temp: 98.7 F (37.1 C) (12/06 2140) Temp src: Oral (12/06 2140) BP: 123/61 mmHg (12/06 2215) Pulse Rate: 96  (12/06 2215)  Labs:  [facility] 02/25/12 2130  HGB 11.0*  HCT 32.7*  PLT 144*  APTT --  LABPROT --  INR --  HEPARINUNFRC --  CREATININE 1.64*  CKTOTAL --  CKMB --  TROPONINI --    Estimated Creatinine Clearance: 30.4 ml/min (by C-G formula based on Cr of 1.64).   Medical History: Past Medical History  Diagnosis Date  . Diabetes mellitus type II, controlled   . Hypertension   . Arthritis   . Hemorrhoids    Assessment: Miranda Garcia is a 78 year old female presenting to the hospital in atrial fibrillation to start heparin. Her cbc consistent with anemia and plts slightly low at 144. Patient is not on any anticoagulant medications prior to admission.   Goal of Therapy:  Heparin level 0.3-0.7 units/ml Monitor platelets by anticoagulation protocol: Yes   Plan:  Heparin 4000 unit IV bolus  Then begin heparin drip at 1550 units/hr F/u with 8 hour HL, timed collect Daily HL and CBC  Thank you,  Francesca Jewett, PharmD, BCPS 02/25/2012 10:34 PM

## 2012-02-25 NOTE — ED Notes (Signed)
Per EMS: pt was at doctors office today and was sent home; when she got home she made it inside and started to have central chest pain that was stabbing pain. Pt states she ate too much at dinner last night and was nausea and vomited once. Pt has no hx of a fib. Pt became hypertensive walking to stretcher. Pt thought indigestion and took tums, pain was getting worse. Pt not in any distress upon arrival. Elevated HR upon arrival. Hx of cath. Diabetes controlled with PO meds.

## 2012-02-25 NOTE — ED Notes (Signed)
Dr. Kathrynn Humble given EKG and told about pts HR and chest pain level.

## 2012-02-25 NOTE — ED Notes (Signed)
Dr. Kathrynn Humble notified of pts HR after bolus of dlitiazem and drip beginning. Pt denies nausea currently.

## 2012-02-25 NOTE — ED Notes (Signed)
Report given to floor nurse, Floy Sabina, RN. Nurse has no further questions upon report. Pt being prepared for transport to floor.

## 2012-02-25 NOTE — ED Notes (Signed)
Heparin verified with Eme, RN

## 2012-02-25 NOTE — ED Provider Notes (Addendum)
History     CSN: [id number]  Arrival date & time 02/25/12  2120   First MD Initiated Contact with Patient 02/25/12 2124      Chief Complaint  Patient presents with  . Atrial Fibrillation  . Chest Pain    (Consider location/radiation/quality/duration/timing/severity/associated sxs/prior treatment) HPI Comments: Pt with hx of HTN, DM comes in with cc of irregular heart beat. Pt started having some palpitations earlier this afternoon. Pt has no sob, but she does have slight mid sternal chest discomfort. No hx of afib, CHF, and she had no syncope, or near syncope like sx. Denies any new meds, denies taking any stimulants.   Patient is a 78 y.o. female presenting with atrial fibrillation and chest pain. The history is provided by the patient.  Atrial Fibrillation Associated symptoms include chest pain. Pertinent negatives include no abdominal pain, no headaches and no shortness of breath.  Chest Pain Pertinent negatives for primary symptoms include no fever, no fatigue, no shortness of breath, no cough, no wheezing, no abdominal pain, no nausea and no vomiting.     Past Medical History  Diagnosis Date  . Diabetes mellitus type II, controlled   . Hypertension   . Arthritis   . Hemorrhoids     Past Surgical History  Procedure Date  . Partial hysterectomy   . Back surgery 1997    [facility]  . Cystoscopy     [name]  . Lithotripsy     [facility]  . Cataract extraction w/phaco 01/11/2011    Procedure: CATARACT EXTRACTION PHACO AND INTRAOCULAR LENS PLACEMENT (IOC);  Surgeon: Tonny Branch;  Location: [facility];  Service: Ophthalmology;  Laterality: Right;  CDE 19.32  . Cataract extraction w/phaco 01/21/2011    Procedure: CATARACT EXTRACTION PHACO AND INTRAOCULAR LENS PLACEMENT (IOC);  Surgeon: Tonny Branch;  Location: [facility];  Service: Ophthalmology;  Laterality: Left;  CDE: 21.19    History reviewed. No pertinent family history.  History  Substance Use Topics  . Smoking status: Never  Smoker   . Smokeless tobacco: Not on file  . Alcohol Use: No    OB History    Grav Para Term Preterm Abortions TAB SAB Ect Mult Living                  Review of Systems  Constitutional: Positive for activity change. Negative for fever and fatigue.  HENT: Negative for facial swelling and neck pain.   Respiratory: Negative for cough, shortness of breath and wheezing.   Cardiovascular: Positive for chest pain.  Gastrointestinal: Negative for nausea, vomiting, abdominal pain, diarrhea, constipation, blood in stool and abdominal distention.  Genitourinary: Negative for dysuria, hematuria and difficulty urinating.  Skin: Negative for color change.  Neurological: Negative for speech difficulty and headaches.  Hematological: Does not bruise/bleed easily.  Psychiatric/Behavioral: Negative for confusion.    Allergies  Penicillins  Home Medications   Current Outpatient Rx  Name  Route  Sig  Dispense  Refill  . CALCIUM CITRATE-VITAMIN D 315-200 MG-UNIT PO TABS   Oral   Take 1 tablet by mouth 2 (two) times daily.           Marland Kitchen GLIMEPIRIDE 4 MG PO TABS   Oral   Take 4 mg by mouth 2 (two) times daily.           Marland Kitchen GLUCOSAMINE HCL 1000 MG PO TABS   Oral   Take 2 tablets by mouth daily.           Marland Kitchen  METOPROLOL TARTRATE 50 MG PO TABS   Oral   Take 50 mg by mouth 2 (two) times daily.           . ADULT MULTIVITAMIN W/MINERALS CH   Oral   Take 1 tablet by mouth daily.         Marland Kitchen PIOGLITAZONE HCL 15 MG PO TABS   Oral   Take 15 mg by mouth daily.           Marland Kitchen SAXAGLIPTIN HCL 5 MG PO TABS   Oral   Take 5 mg by mouth daily.           Marland Kitchen VITAMIN E PO   Oral   Take 1 capsule by mouth daily.           BP 132/100  Pulse 115  Temp 98.7 F (37.1 C) (Oral)  Resp 31  Ht 5\' 3"  (1.6 m)  Wt 208 lb (94.348 kg)  BMI 36.85 kg/m2  SpO2 98%  Physical Exam  Nursing note and vitals reviewed. Constitutional: She is oriented to person, place, and time. She appears  well-developed and well-nourished.  HENT:  Head: Normocephalic and atraumatic.  Eyes: EOM are normal. Pupils are equal, round, and reactive to light.  Neck: Neck supple. No JVD present.  Cardiovascular: Normal heart sounds.   No murmur heard.      Irregularly irregular, tachycardia  Pulmonary/Chest: Effort normal. No respiratory distress.  Abdominal: Soft. She exhibits no distension. There is no tenderness. There is no rebound and no guarding.  Musculoskeletal: Normal range of motion. She exhibits no edema.  Neurological: She is alert and oriented to person, place, and time.  Skin: Skin is warm and dry.    ED Course  Procedures (including critical care time)  Labs Reviewed  BASIC METABOLIC PANEL - Abnormal; Notable for the following:    Glucose, Bld 215 (*)     BUN 32 (*)     Creatinine, Ser 1.64 (*)     GFR calc non Af Amer 29 (*)     GFR calc Af Amer 33 (*)     All other components within normal limits  CBC WITH DIFFERENTIAL - Abnormal; Notable for the following:    WBC 12.0 (*)     RBC 3.55 (*)     Hemoglobin 11.0 (*)     HCT 32.7 (*)     Platelets 144 (*)     Neutrophils Relative 78 (*)     Neutro Abs 9.4 (*)     Monocytes Absolute 1.2 (*)     All other components within normal limits  POCT I-STAT TROPONIN I  HEPARIN LEVEL (UNFRACTIONATED)  PHOSPHORUS  MAGNESIUM  TSH  BASIC METABOLIC PANEL   Dg Chest Portable 1 View  02/25/2012  *RADIOLOGY REPORT*  Clinical Data: Chest pain, hypertension.  PORTABLE CHEST - 1 VIEW  Comparison: 09/08/2011  Findings: Mild cardiomegaly.  Low lung volumes.  No confluent airspace opacities or effusions.  No overt edema.  No acute bony abnormality.  IMPRESSION: Cardiomegaly.  Low lung volumes.  No acute findings.   Original Report Authenticated By: Rolm Baptise, M.D.      1. Atrial fibrillation with RVR   2. Chest pain   3. Acute on chronic renal failure   4. Anemia due to chronic illness   5. Atrial fibrillation       MDM    Date: 02/25/2012  Rate: 161  Rhythm: atrial fibrillation  QRS Axis: left  Intervals: normal  ST/T Wave abnormalities: nonspecific ST/T changes  Conduction Disutrbances:none  Narrative Interpretation:   Old EKG Reviewed: changes noted - new afib Pt has ST changes over the AvR - elevation and depression in v5, v6.   Date: 02/25/2012 - post diltiazem  Rate:111  Rhythm: atrial fibrillation  QRS Axis: left  Intervals: normal  ST/T Wave abnormalities: nonspecific ST/T changes  Conduction Disutrbances:none  Narrative Interpretation:   Old EKG Reviewed: changes noted - no ST changes in avr as before  Pt comes in with palpitations, noted to be in afib with rvr. Hemodynamically stable. She has some chest pain - but i dont think we need to do electric cardioversion at this time.  No known etiology for the afib with rvr - as hx not indicative of infections, stimulant intake or CHF, COPD. Will initiate rate control. Her arrival EKG has some ST changes to avR and lateral leads, but we will get a repeat EKG - if the findings persists, will fax the email to cards as non definite EKG.     Varney Biles, MD 02/25/12 2346  11:46 PM Repeat EKG is better. Has mild chest discomfort. ASA given, will give SL nitro. Goal of < 110 achieved with intact BP.  CRITICAL CARE Performed by: Varney Biles   Total critical care time: 45 minutes  Critical care time was exclusive of separately billable procedures and treating other patients.  Critical care was necessary to treat or prevent imminent or life-threatening deterioration.  Critical care was time spent personally by me on the following activities: development of treatment plan with patient and/or surrogate as well as nursing, discussions with consultants, evaluation of patient's response to treatment, examination of patient, obtaining history from patient or surrogate, ordering and performing treatments and interventions, ordering and review  of laboratory studies, ordering and review of radiographic studies, pulse oximetry and re-evaluation of patient's condition.   Varney Biles, MD 02/25/12 [phone]

## 2012-02-25 NOTE — ED Notes (Signed)
Chest Xray at bedside 

## 2012-02-25 NOTE — ED Notes (Signed)
Pt denies nausea currently; pt denies feeling fluttering in chest anymore; pt denies shortness of breath; pt mentating appropriately.

## 2012-02-25 NOTE — ED Notes (Signed)
Dr. Myers at bedside.

## 2012-02-25 NOTE — ED Notes (Signed)
500cc bolus completed pr Dr. Andree Elk order, starting diltiazem

## 2012-02-25 NOTE — H&P (Signed)
[facility] History and Physical  Miranda Garcia [id number] DOB: [birth year] DOA: 02/25/2012  Referring physician: ED physician PCP: Glenda Chroman., MD   Chief Complaint: Chest pain  HPI:  Pt is 77 yo female who presents to [facility] ED with main concern of sudden onset chest discomfort that started several hours prior to admission, dull and pressure like in nature, intermittent and 7/10 in severity when present, non radiating but associated with palpitations. Pt denies fevers, chills, abdominal or urinary concerns, no similar events in the past, no shortness of breath. Pt also denies specific focal weakness, dizziness, or visual changes.   ED EVENTS: pt found to be in atrial fibrillation and was started on Heparin drip as CHADS score is 4, pt reported pain improved with pain medications given in ED, pt was also given Diltiazem 20 mg IV x 1 dose  Assessment and Plan:  Principal Problem:  *Chest pain - in the setting of new onset a-fib - pt started on heparin drip per pharmacy and will need transition to Coumadin - follow up on cardiac enzymes, TSH - continue metoprolol 50 mg BID PO Active Problems:  Acute on chronic renal failure - last BMP in 2012 indicative of Cr ~ 1.8 and if that is the baseline than pt is actually much better at this point - will continue to monitor for now and will obtain BMP in AM  Leukocytosis - this is potentially related to acute stress and demargination in the setting of new Afib - I am not able to find clear infectious source as pt denies any urinary and abdominal symptoms, CXR unremarkable for infectious process - CBC in AM  Anemia due to chronic illness - Hg and Hct appear to be stable and at baseline - CBC in AM  Diabetes - will check A1C - continue Glipizide and Actos as per home medication regimen  Atrial fibrillation - new onset with CHADS 4 - will continue Heparin drip and will need transition to Coumadin - continue Metoprolol 50 mg BI PO  for now - follow up on cardiac enzymes and TSH  Code Status: Full Family Communication: Pt at bedside Disposition Plan:Admit to telemetry floor    Review of Systems:  Constitutional: Negative for fever, chills and malaise/fatigue. Negative for diaphoresis.  HENT: Negative for hearing loss, ear pain, nosebleeds, congestion, sore throat, neck pain, tinnitus and ear discharge.   Eyes: Negative for blurred vision, double vision, photophobia, pain, discharge and redness.  Respiratory: Negative for cough, hemoptysis, sputum production, shortness of breath, wheezing and stridor.   Cardiovascular: Positive for chest pain, palpitations, negative for orthopnea, claudication and leg swelling.  Gastrointestinal: Negative for nausea, vomiting and abdominal pain. Negative for heartburn, constipation, blood in stool and melena.  Genitourinary: Negative for dysuria, urgency, frequency, hematuria and flank pain.  Musculoskeletal: Negative for myalgias, back pain, joint pain and falls.  Skin: Negative for itching and rash.  Neurological: Negative for dizziness and weakness. Negative for tingling, tremors, sensory change, speech change, focal weakness, loss of consciousness and headaches.  Endo/Heme/Allergies: Negative for environmental allergies and polydipsia. Does not bruise/bleed easily.  Psychiatric/Behavioral: Negative for suicidal ideas. The patient is not nervous/anxious.      Past Medical History  Diagnosis Date  . Diabetes mellitus type II, controlled   . Hypertension   . Arthritis   . Hemorrhoids     Past Surgical History  Procedure Date  . Partial hysterectomy   . Back surgery 1997    [facility]  . Cystoscopy  [name]  . Lithotripsy     [facility]  . Cataract extraction w/phaco 01/11/2011    Procedure: CATARACT EXTRACTION PHACO AND INTRAOCULAR LENS PLACEMENT (IOC);  Surgeon: Tonny Branch;  Location: [facility];  Service: Ophthalmology;  Laterality: Right;  CDE 19.32  . Cataract extraction  w/phaco 01/21/2011    Procedure: CATARACT EXTRACTION PHACO AND INTRAOCULAR LENS PLACEMENT (IOC);  Surgeon: Tonny Branch;  Location: [facility];  Service: Ophthalmology;  Laterality: Left;  CDE: 21.19    Social History:  reports that she has never smoked. She does not have any smokeless tobacco history on file. She reports that she does not drink alcohol or use illicit drugs.  Allergies  Allergen Reactions  . Penicillins Rash    History reviewed. No pertinent family history.  Prior to Admission medications   Medication Sig Start Date End Date Taking? Authorizing Provider  calcium citrate-vitamin D (CITRACAL+D) 315-200 MG-UNIT per tablet Take 1 tablet by mouth 2 (two) times daily.     Yes Historical Provider, MD  glimepiride (AMARYL) 4 MG tablet Take 4 mg by mouth 2 (two) times daily.     Yes Historical Provider, MD  Glucosamine HCl 1000 MG TABS Take 2 tablets by mouth daily.     Yes Historical Provider, MD  metoprolol (LOPRESSOR) 50 MG tablet Take 50 mg by mouth 2 (two) times daily.     Yes Historical Provider, MD  Multiple Vitamin (MULTIVITAMIN WITH MINERALS) TABS Take 1 tablet by mouth daily.   Yes Historical Provider, MD  pioglitazone (ACTOS) 15 MG tablet Take 15 mg by mouth daily.     Yes Historical Provider, MD  saxagliptin HCl (ONGLYZA) 5 MG TABS tablet Take 5 mg by mouth daily.     Yes Historical Provider, MD  VITAMIN E PO Take 1 capsule by mouth daily.   Yes Historical Provider, MD    Physical Exam: Filed Vitals:   02/25/12 2200 02/25/12 2215 02/25/12 2217 02/25/12 2230  BP: 126/85 123/61  110/55  Pulse: 156 96  97  Temp:      TempSrc:      Resp: 21 20  21   Height:   5\' 3"  (1.6 m)   Weight:   94.348 kg (208 lb)   SpO2: 99% 98%  98%    Physical Exam  Constitutional: Appears well-developed and well-nourished. No distress.  HENT: Normocephalic. External right and left ear normal. Oropharynx is clear and moist.  Eyes: Conjunctivae and EOM are normal. PERRLA, no scleral icterus.   Neck: Normal ROM. Neck supple. No JVD. No tracheal deviation. No thyromegaly.  CVS: IRRR, no gallops, no carotid bruit.  Pulmonary: Effort and breath sounds normal, decreased breath sounds at bases Abdominal: Soft. BS +,  no distension, tenderness, rebound or guarding.  Musculoskeletal: Normal range of motion. No edema and no tenderness.  Lymphadenopathy: No lymphadenopathy noted, cervical, inguinal. Neuro: Alert. Normal reflexes, muscle tone coordination. No cranial nerve deficit. Skin: Skin is warm and dry. No rash noted. Not diaphoretic. No erythema. No pallor.  Psychiatric: Normal mood and affect. Behavior, judgment, thought content normal.   Labs on Admission:  Basic Metabolic Panel:  Lab 99991111 2130  NA 135  K 4.3  CL 100  CO2 23  GLUCOSE 215*  BUN 32*  CREATININE 1.64*  CALCIUM 10.4  MG --  PHOS --   Liver Function Tests: CBC:  Lab 02/25/12 2130  WBC 12.0*  NEUTROABS 9.4*  HGB 11.0*  HCT 32.7*  MCV 92.1  PLT 144*  Cardiac Enzymes:  Radiological Exams on Admission: Dg Chest Portable 1 View  02/25/2012  *RADIOLOGY REPORT*  Clinical Data: Chest pain, hypertension.  PORTABLE CHEST - 1 VIEW  Comparison: 09/08/2011  Findings: Mild cardiomegaly.  Low lung volumes.  No confluent airspace opacities or effusions.  No overt edema.  No acute bony abnormality.  IMPRESSION: Cardiomegaly.  Low lung volumes.  No acute findings.   Original Report Authenticated By: Rolm Baptise, M.D.     EKG: Normal sinus rhythm, no ST/T wave changes  Faye Ramsay, MD  [facility] Pager [phone]  If 7PM-7AM, please contact night-coverage [email or web address] Password [email or web address] 02/25/2012, 11:08 PM

## 2012-02-26 ENCOUNTER — Encounter (HOSPITAL_COMMUNITY): Payer: Self-pay | Admitting: Internal Medicine

## 2012-02-26 DIAGNOSIS — R079 Chest pain, unspecified: Secondary | ICD-10-CM

## 2012-02-26 DIAGNOSIS — I509 Heart failure, unspecified: Secondary | ICD-10-CM | POA: Insufficient documentation

## 2012-02-26 DIAGNOSIS — E119 Type 2 diabetes mellitus without complications: Secondary | ICD-10-CM

## 2012-02-26 DIAGNOSIS — I4891 Unspecified atrial fibrillation: Principal | ICD-10-CM

## 2012-02-26 DIAGNOSIS — R7989 Other specified abnormal findings of blood chemistry: Secondary | ICD-10-CM

## 2012-02-26 LAB — TSH: TSH: 0.671 u[IU]/mL (ref 0.350–4.500)

## 2012-02-26 LAB — HEPARIN LEVEL (UNFRACTIONATED): Heparin Unfractionated: 0.77 IU/mL — ABNORMAL HIGH (ref 0.30–0.70)

## 2012-02-26 LAB — MAGNESIUM: Magnesium: 1.2 mg/dL — ABNORMAL LOW (ref 1.5–2.5)

## 2012-02-26 LAB — BASIC METABOLIC PANEL
BUN: 33 mg/dL — ABNORMAL HIGH (ref 6–23)
CO2: 23 mEq/L (ref 19–32)
Calcium: 9.3 mg/dL (ref 8.4–10.5)
Creatinine, Ser: 1.67 mg/dL — ABNORMAL HIGH (ref 0.50–1.10)

## 2012-02-26 LAB — GLUCOSE, CAPILLARY
Glucose-Capillary: 88 mg/dL (ref 70–99)
Glucose-Capillary: 134 mg/dL — ABNORMAL HIGH (ref 70–99)

## 2012-02-26 LAB — TROPONIN I: Troponin I: <0.3 ng/mL (ref <0.30)

## 2012-02-26 MED ORDER — DEXTROSE 5 % IV SOLN
5.0000 mg/h | INTRAVENOUS | Status: DC
  Administered 2012-02-26: 10 mg/h via INTRAVENOUS
  Filled 2012-02-26: qty 100

## 2012-02-26 MED ORDER — MAGNESIUM SULFATE 40 MG/ML IJ SOLN
2.0000 g | Freq: Once | INTRAMUSCULAR | Status: AC
  Administered 2012-02-26: 2 g via INTRAVENOUS
  Filled 2012-02-26: qty 50

## 2012-02-26 MED ORDER — INSULIN ASPART 100 UNIT/ML ~~LOC~~ SOLN: 4.0000 [IU] | Freq: Three times a day (TID) | SUBCUTANEOUS | Status: DC

## 2012-02-26 MED ORDER — INSULIN ASPART 100 UNIT/ML ~~LOC~~ SOLN: 0.0000 [IU] | Freq: Three times a day (TID) | SUBCUTANEOUS | Status: DC

## 2012-02-26 MED ORDER — DILTIAZEM HCL 60 MG PO TABS
60.0000 mg | ORAL_TABLET | Freq: Three times a day (TID) | ORAL | Status: DC
  Administered 2012-02-26 – 2012-02-29 (×9): 60 mg via ORAL
  Filled 2012-02-26 (×12): qty 1

## 2012-02-26 MED ORDER — OFF THE BEAT BOOK
Freq: Once | Status: AC
  Administered 2012-02-26: 01:00:00
  Filled 2012-02-26: qty 1

## 2012-02-26 MED ORDER — FUROSEMIDE 10 MG/ML IJ SOLN
40.0000 mg | Freq: Two times a day (BID) | INTRAMUSCULAR | Status: AC
  Administered 2012-02-26 – 2012-02-27 (×2): 40 mg via INTRAVENOUS
  Filled 2012-02-26 (×2): qty 4

## 2012-02-26 NOTE — Progress Notes (Signed)
  Echocardiogram 2D Echocardiogram has been performed.  Keron Koffman 02/26/2012, 2:45 PM

## 2012-02-26 NOTE — Consult Note (Signed)
CARDIOLOGY CONSULT NOTE  Patient ID: Miranda Garcia, MRN: [id number], DOB/AGE: [birth year] 78 y.o. Admit date: 02/25/2012 Date of Consult: 02/26/2012  Primary Physician: Glenda Chroman., MD Primary Cardiologist: [email or web address]  Chief Complaint: atrial fibrillation    HPI Miranda Garcia is a 78 y.o. female  seen at the request of the hospitalists because of newly identified atrial fibrillation. She presented to the emergency room with chest discomfort she is found to be in atrial fibrillation. She received diltiazem and heparin. She resolved into sinus rhythm overnight.  She does not recall having similar palpitations before that she noted last night when the heart rate was very fast.  The chest discomfort she had last night she describes as a squeezing. It was associated with shortness of breath and accompanied by indigestion. She has noted this discomfort previously while walking and is relieved by rest. It has been increasing in frequency over recent months since its inception of about 12 months ago. The discomfort lasted only however was also accompanied by pain with deep breathing.  The night before she had been up to the [place] and then had nausea and vomiting. There is no accompanying chest discomfort.  CHADS2 score is 3 notable for hypertension age and diabetes. She denies prior stroke. Her CHADS-VASc score is 5  She says her hemoglobin A1c was 6.4 and her lipids were in good shape  She was noted to be markedly hypomagnesemic on arrival  Past Medical History  Diagnosis Date  . Diabetes mellitus type II, controlled   . Hypertension   . Arthritis   . Hemorrhoids       Surgical History:  Past Surgical History  Procedure Date  . Partial hysterectomy   . Back surgery 1997    [facility]  . Cystoscopy     [name]  . Lithotripsy     [facility]  . Cataract extraction w/phaco 01/11/2011    Procedure: CATARACT EXTRACTION PHACO AND INTRAOCULAR LENS PLACEMENT (IOC);  Surgeon: Tonny Branch;   Location: [facility];  Service: Ophthalmology;  Laterality: Right;  CDE 19.32  . Cataract extraction w/phaco 01/21/2011    Procedure: CATARACT EXTRACTION PHACO AND INTRAOCULAR LENS PLACEMENT (IOC);  Surgeon: Tonny Branch;  Location: [facility];  Service: Ophthalmology;  Laterality: Left;  CDE: 21.19     Home Meds: Prior to Admission medications   Medication Sig Start Date End Date Taking? Authorizing Provider  calcium citrate-vitamin D (CITRACAL+D) 315-200 MG-UNIT per tablet Take 1 tablet by mouth 2 (two) times daily.     Yes Historical Provider, MD  glimepiride (AMARYL) 4 MG tablet Take 4 mg by mouth 2 (two) times daily.     Yes Historical Provider, MD  Glucosamine HCl 1000 MG TABS Take 2 tablets by mouth daily.     Yes Historical Provider, MD  metoprolol (LOPRESSOR) 50 MG tablet Take 50 mg by mouth 2 (two) times daily.     Yes Historical Provider, MD  Multiple Vitamin (MULTIVITAMIN WITH MINERALS) TABS Take 1 tablet by mouth daily.   Yes Historical Provider, MD  pioglitazone (ACTOS) 15 MG tablet Take 15 mg by mouth daily.     Yes Historical Provider, MD  saxagliptin HCl (ONGLYZA) 5 MG TABS tablet Take 5 mg by mouth daily.     Yes Historical Provider, MD  VITAMIN E PO Take 1 capsule by mouth daily.   Yes Historical Provider, MD    Inpatient Medications:    . [COMPLETED] sodium chloride   Intravenous STAT  . [id number]  diltiazem (CARDIZEM) infusion  5-15 mg/hr Intravenous Once  . [COMPLETED] diltiazem  20 mg Intravenous Once  . diltiazem  60 mg Oral Q8H  . glimepiride  4 mg Oral BID  . [COMPLETED] heparin  4,000 Units Intravenous Once  . insulin aspart  0-15 Units Subcutaneous TID WC  . insulin aspart  4 Units Subcutaneous TID WC  . linagliptin  5 mg Oral Daily  . [COMPLETED] magnesium sulfate 1 - 4 g bolus IVPB  2 g Intravenous Once  . metoprolol  50 mg Oral BID  . [COMPLETED] off the beat book   Does not apply Once  . pioglitazone  15 mg Oral Daily  . [COMPLETED] sodium chloride  1,000 mL  Intravenous Once  . sodium chloride  3 mL Intravenous Q12H    Allergies:  Allergies  Allergen Reactions  . Penicillins Rash    History   Social History  . Marital Status: Widowed    Spouse Name: N/A    Number of Children: N/A  . Years of Education: N/A   Occupational History  . Not on file.   Social History Main Topics  . Smoking status: Never Smoker   . Smokeless tobacco: Not on file  . Alcohol Use: No  . Drug Use: No  . Sexually Active:    Other Topics Concern  . Not on file   Social History Narrative  . No narrative on file     History reviewed. No pertinent family history.   ROS:  Please see the history of present illness.   Negative except Nephrolithiasis with subsequent loss of function of the kidney, cataract surgery bilaterally, mild arthritis, some GE reflux disease  All other systems reviewed and negative.    Physical Exam:  Blood pressure 128/75, pulse 80, temperature 99 F (37.2 C), temperature source Oral, resp. rate 25, height 5\' 3"  (1.6 m), weight 205 lb 8 oz (93.214 kg), SpO2 98.00%. General: Well developed, well nourished obese Caucasian age appearing female in no acute distress. Head: Normocephalic, atraumatic, sclera non-icteric, no xanthomas, nares are without discharge. Lymph Nodes:  none Neck: Negative for carotid bruits. JVD GREATER than 10 cm  Back:without scoliosis kyphosis  Lungs: Clear bilaterally to auscultation without wheezes, rales, or rhonchi. Breathing is unlabored. Heart: RRR with S1 S2. No  murmur . No rubs, positive S4 Abdomen: Soft, non-tender, non-distended with normoactive bowel sounds. No hepatomegaly. No rebound/guarding. No obvious abdominal masses. Msk:  Strength and tone appear normal for age. Extremities: No clubbing or cyanosis. +2 edema.  Distal pedal pulses are 2+ and equal bilaterally. Skin: Warm and Dry Neuro: Alert and oriented X 3. CN III-XII intact Grossly normal sensory and motor function . Psych:  Responds to  questions appropriately with a normal affect.      Labs: Cardiac Enzymes  Basename 02/26/12 0449 02/26/12 0014  CKTOTAL -- --  CKMB -- --  TROPONINI 0.68* <0.30   CBC Lab Results  Component Value Date   WBC 12.0* 02/25/2012   HGB 11.0* 02/25/2012   HCT 32.7* 02/25/2012   MCV 92.1 02/25/2012   PLT 144* 02/25/2012   PROTIME: No results found for this basename: LABPROT:3,INR:3 in the last 72 hours Chemistry  Lab 02/26/12 0449  NA 137  K 4.2  CL 103  CO2 23  BUN 33*  CREATININE 1.67*  CALCIUM 9.3  PROT --  BILITOT --  ALKPHOS --  ALT --  AST --  GLUCOSE 207*   Lipids No results found for this  basename: CHOL, HDL, LDLCALC, TRIG   BNP No results found for this basename: probnp   Miscellaneous No results found for this basename: DDIMER    Radiology/Studies:  Dg Chest Portable 1 View  02/25/2012  *RADIOLOGY REPORT*  Clinical Data: Chest pain, hypertension.  PORTABLE CHEST - 1 VIEW  Comparison: 09/08/2011  Findings: Mild cardiomegaly.  Low lung volumes.  No confluent airspace opacities or effusions.  No overt edema.  No acute bony abnormality.  IMPRESSION: Cardiomegaly.  Low lung volumes.  No acute findings.   Original Report Authenticated By: Rolm Baptise, M.D.     EKG: 12/7 demonstrated sinus rhythm at 65 with normal intervals and no ST T changes  Echocardiogram with atrial fibrillation 12/6 demonstrated rate of 1:15 with minor ST segment changes Assessment and Plan:  Patient Active Hospital Problem List: Chest pain (02/25/2012)   Atrial fibrillation (02/25/2012)   CKD (chronic kidney disease) stage 3, GFR 30-59 ml/min (02/25/2012)   Leukocytosis (02/25/2012)   Anemia due to chronic illness (02/25/2012)   Diabetes (02/25/2012)   Hypomagnesemia (02/26/2012)   Elevated troponin (02/26/2012)  Congestive heart failure    The patient presents with chest pain consistent with angina previously identified with exertion. Troponins were elevated in the setting of atrial  fibrillation and could either represent a type II injury or the primary inciting event. With her antecedent symptoms, I suspect it is a latter. She also manifests congestive heart failure but currently we don't know whether she has normal systolic function or not.  She has reverted to sinus rhythm.  1) I would plan to discontinue her diltiazem. 2) I would continue her heparin. 3) anticipate catheterization on Monday; it would be coronaries only given her renal dysfunction and she'll need rehydration 4) gentle diuresis as of now 5) would anticipate the use of an ACE inhibitor for renal protection 6) will be long-term anticoagulation probably with a NOAC 7) would probably benefit from a sleep study note 8) Y. in the world was she still hypomagnesemic      Virl Axe

## 2012-02-26 NOTE — Progress Notes (Signed)
ANTICOAGULATION CONSULT NOTE - Initial Consult  Pharmacy Consult for Heparin  Indication: atrial fibrillation  Allergies  Allergen Reactions  . Penicillins Rash   Patient Measurements: Height: 5\' 3"  (160 cm) Weight: 205 lb 8 oz (93.214 kg) IBW/kg (Calculated) : 52.4  Heparin Dosing Weight = 64 kg  Vital Signs: Temp: 99 F (37.2 C) (12/07 0500) Temp src: Oral (12/07 0500) BP: 99/54 mmHg (12/07 0500) Pulse Rate: 78  (12/07 0500)  Labs:  Basename 02/26/12 0449 02/26/12 0014 02/25/12 2130  HGB -- -- 11.0*  HCT -- -- 32.7*  PLT -- -- 144*  APTT -- -- --  LABPROT -- -- --  INR -- -- --  HEPARINUNFRC 0.77* -- --  CREATININE 1.67* -- 1.64*  CKTOTAL -- -- --  CKMB -- -- --  TROPONINI 0.68* <0.30 --   Estimated Creatinine Clearance: 29.6 ml/min (by C-G formula based on Cr of 1.67).  Medical History: Past Medical History  Diagnosis Date  . Diabetes mellitus type II, controlled   . Hypertension   . Arthritis   . Hemorrhoids    Assessment: 78 year old female presenting to the hospital in atrial fibrillation.  She was started on IV heparin. Her troponin is elevated this morning at 0.68.  Her heparin level is slightly above goal range of 0.3-0.7 at 0.77 this morning.  She is without noted bleeding complications.  Her CBC was stable yesterday with platelets just below goal of > 150.   Spoke with nurse, he has not noted any bleeding with her anticoagulation.  She has converted to NSR per RN.  Will decrease rate slightly to maintain her within range.  Goal of Therapy:  Heparin level 0.3-0.7 units/ml Monitor platelets by anticoagulation protocol: Yes   Plan:   Decrease IV heparin drip to 1450 units/hr  F/U AM heparin level and adjust as needed.  Will she be started on Xarelto (15mg  daily) or Warfarin?  Rober Minion, PharmD., MS Clinical Pharmacist Pager:  [phone] Thank you for allowing pharmacy to be part of this patients care team. 02/26/2012 8:02 AM

## 2012-02-26 NOTE — Progress Notes (Signed)
Dr. Jerilee Hoh called and updated pt converting from A fib to SR. Also had updated MD on elevated troponin level at 0.68. No new orders received at this time. Ekg done as per protocol. Carlynn Spry RN

## 2012-02-26 NOTE — Progress Notes (Signed)
[facility]             Progress Note   Subjective: No complaints. Has returned to NSR.  Objective: Vital signs in last 24 hours: Temp:  [98.7 F (37.1 C)-99 F (37.2 C)] 99 F (37.2 C) (12/07 0500) Pulse Rate:  [78-167] 78  (12/07 0500) Resp:  [20-31] 25  (12/06 2315) BP: (99-132)/(54-100) 99/54 mmHg (12/07 0500) SpO2:  [98 %-99 %] 98 % (12/07 0500) Weight:  [93.214 kg (205 lb 8 oz)-94.348 kg (208 lb)] 93.214 kg (205 lb 8 oz) (12/06 2345) Weight change:  Last BM Date: 02/25/12  Intake/Output from previous day:       Physical Exam: General: Alert, awake, oriented x3, in no acute distress. HEENT: No bruits, no goiter. Heart: Regular rate and rhythm, without murmurs, rubs, gallops. Lungs: Clear to auscultation bilaterally. Abdomen: Soft, nontender, nondistended, positive bowel sounds. Extremities: 1+ bilateral pitting edema with positive pedal pulses. Neuro: Grossly intact, nonfocal.    Lab Results: Basic Metabolic Panel:  Basename 02/26/12 0449 02/25/12 2322 02/25/12 2130  NA 137 -- 135  K 4.2 -- 4.3  CL 103 -- 100  CO2 23 -- 23  GLUCOSE 207* -- 215*  BUN 33* -- 32*  CREATININE 1.67* -- 1.64*  CALCIUM 9.3 -- 10.4  MG -- 1.2* --  PHOS -- 3.3 --   CBC:  Basename 02/25/12 2130  WBC 12.0*  NEUTROABS 9.4*  HGB 11.0*  HCT 32.7*  MCV 92.1  PLT 144*   Cardiac Enzymes:  Basename 02/26/12 0449 02/26/12 0014  CKTOTAL -- --  CKMB -- --  CKMBINDEX -- --  TROPONINI 0.68* <0.30   CBG:  Basename 02/26/12 0003  GLUCAP 194*    Studies/Results: Dg Chest Portable 1 View  02/25/2012  *RADIOLOGY REPORT*  Clinical Data: Chest pain, hypertension.  PORTABLE CHEST - 1 VIEW  Comparison: 09/08/2011  Findings: Mild cardiomegaly.  Low lung volumes.  No confluent airspace opacities or effusions.  No overt edema.  No acute bony abnormality.  IMPRESSION: Cardiomegaly.  Low lung volumes.  No acute findings.   Original Report Authenticated By: Rolm Baptise,  M.D.     Medications: Scheduled Meds:    . sodium chloride   Intravenous STAT  . [COMPLETED] diltiazem (CARDIZEM) infusion  5-15 mg/hr Intravenous Once  . [COMPLETED] diltiazem  20 mg Intravenous Once  . glimepiride  4 mg Oral BID  . [COMPLETED] heparin  4,000 Units Intravenous Once  . linagliptin  5 mg Oral Daily  . magnesium sulfate 1 - 4 g bolus IVPB  2 g Intravenous Once  . metoprolol  50 mg Oral BID  . [COMPLETED] off the beat book   Does not apply Once  . pioglitazone  15 mg Oral Daily  . [COMPLETED] sodium chloride  1,000 mL Intravenous Once  . sodium chloride  3 mL Intravenous Q12H   Continuous Infusions:    . diltiazem (CARDIZEM) infusion 10 mg/hr (02/26/12 0653)  . heparin 1,450 Units/hr (02/26/12 0835)   PRN Meds:.HYDROcodone-acetaminophen, ondansetron (ZOFRAN) IV, ondansetron, [DISCONTINUED] ondansetron (ZOFRAN) IV  Assessment/Plan:  Principal Problem:  *Chest pain Active Problems:  Atrial fibrillation  CKD (chronic kidney disease) stage 3, GFR 30-59 ml/min  Leukocytosis  Anemia due to chronic illness  Diabetes  Hypomagnesemia  Elevated troponin   New Onset A FIB -Has converted back to NSR. -Will start PO cardizem and wean IV drip. -Has a CHADS score of at least 3. -Has been started on a heparin drip. -Will order 2D  ECHO. -If non-valvular afib then we may consider one of the new anticoagulants to avoid the hassles of coumadin dosing. -Mg is 1.2. Will replete. K ok.  Elevated Troponins -Could have been from the a fib. -By history she had CP before she felt the "fluttering" in her chest, so wonder if a cardiac event precipitated the a fib. -Troponin is 0.68 and had been negative previously. -She does have some CAD risk factors. -Has not had any workup that I can find in her chart. -Have asked [facility] cards to see her.  Hypomagnesemia -Replete IV.  DM II -CBGs elevated. -Start SSI.  DVT Prophylaxis -On heparin drip.  CKD Stage III -At baseline  of around 1.6.  Time spent coordinating care: 35 minutes.   LOS: 1 day   [facility] Pager: [phone] 02/26/2012, 10:26 AM

## 2012-02-27 LAB — GLUCOSE, CAPILLARY: Glucose-Capillary: 73 mg/dL (ref 70–99)

## 2012-02-27 LAB — BASIC METABOLIC PANEL
CO2: 26 mEq/L (ref 19–32)
Calcium: 8.6 mg/dL (ref 8.4–10.5)
GFR calc Af Amer: 29 mL/min — ABNORMAL LOW (ref >90)
GFR calc non Af Amer: 25 mL/min — ABNORMAL LOW (ref >90)
Sodium: 141 mEq/L (ref 135–145)

## 2012-02-27 LAB — CBC
Platelets: 127 10*3/uL — ABNORMAL LOW (ref 150–400)
RBC: 3.14 MIL/uL — ABNORMAL LOW (ref 3.87–5.11)
RDW: 14 % (ref 11.5–15.5)
WBC: 7.4 10*3/uL (ref 4.0–10.5)

## 2012-02-27 LAB — HEPARIN LEVEL (UNFRACTIONATED): Heparin Unfractionated: 0.6 IU/mL (ref 0.30–0.70)

## 2012-02-27 LAB — MAGNESIUM: Magnesium: 1.5 mg/dL (ref 1.5–2.5)

## 2012-02-27 NOTE — Progress Notes (Signed)
ANTICOAGULATION CONSULT NOTE - FOLLOW UP  Pharmacy Consult for Heparin  Indication: atrial fibrillation  Allergies  Allergen Reactions  . Penicillins Rash   Patient Measurements: Height: 5\' 3"  (160 cm) Weight: 208 lb 1.6 oz (94.394 kg) IBW/kg (Calculated) : 52.4  Heparin Dosing Weight = 64 kg  Vital Signs: Temp: 98.7 F (37.1 C) (12/08 0600) BP: 129/40 mmHg (12/08 0600) Pulse Rate: 70  (12/08 0600)  Labs:  Basename 02/27/12 0515 02/26/12 1232 02/26/12 0449 02/26/12 0014 02/25/12 2130  HGB 9.5* -- -- -- 11.0*  HCT 28.9* -- -- -- 32.7*  PLT 127* -- -- -- 144*  APTT -- -- -- -- --  LABPROT -- -- -- -- --  INR -- -- -- -- --  HEPARINUNFRC 0.60 -- 0.77* -- --  CREATININE 1.83* -- 1.67* -- 1.64*  CKTOTAL -- -- -- -- --  CKMB -- -- -- -- --  TROPONINI -- 0.65* 0.68* <0.30 --   Estimated Creatinine Clearance: 27.2 ml/min (by C-G formula based on Cr of 1.83).  Medical History: Past Medical History  Diagnosis Date  . Diabetes mellitus type II, controlled   . Hypertension   . Arthritis   . Hemorrhoids   . Atrial fibrillation   . Elevated troponin    Assessment: 78 year old female presenting to the hospital in atrial fibrillation.  She was started on IV heparin. Her troponin was elevated on admit to a high of 0.68.  Her heparin level is therapeutic at 0.6IU/ml on IV heparin rate of 1450 units/hr.  She is without noted bleeding complications.  Her CBC today reveals a drop in her H/H (11.0>>9.5) and platelets are trending down as well (144>>127).  She is now in NSR per EKG yesterday.  Plans for cardiac cath on Monday noted.  Goal of Therapy:  Heparin level 0.3-0.7 units/ml Monitor platelets by anticoagulation protocol: Yes   Plan:   Continue IV heparin at 1450 units/hr  F/U AM heparin level and adjust as needed.  Monitor CBC and s/s of bleeding complications with the drop in her H/H and platelets.  Rober Minion, PharmD., MS Clinical Pharmacist Pager:   [phone] Thank you for allowing pharmacy to be part of this patients care team. 02/27/2012 7:50 AM

## 2012-02-27 NOTE — Progress Notes (Signed)
Utilization review completed.  

## 2012-02-27 NOTE — Progress Notes (Signed)
  Patient Name: Miranda Garcia      SUBJECTIVE: seen yesterday for AFib assoc + tn but with antecedent history of exertional chest pain; CKD  Past Medical History  Diagnosis Date  . Diabetes mellitus type II, controlled   . Hypertension   . Arthritis   . Hemorrhoids   . Atrial fibrillation   . Elevated troponin     PHYSICAL EXAM Filed Vitals:   02/26/12 1106 02/26/12 1359 02/26/12 2100 02/27/12 0600  BP: 128/75 130/70 170/63 129/40  Pulse: 80  73 70  Temp:   98.2 F (36.8 C) 98.7 F (37.1 C)  TempSrc:      Resp:   22 16  Height:      Weight:    208 lb 1.6 oz (94.394 kg)  SpO2:   99% 98%    Well developed and nourished in no acute distress HENT normal Neck supple with JVP-flat Carotids brisk and full without bruits Clear Regular rate and rhythm, no murmurs or gallops Abd-soft with active BS without hepatomegaly No Clubbing cyanosis edema Skin-warm and dry A & Oriented  Grossly normal sensory and motor function   TELEMETRY: Reviewed telemetry pt in:NSR    Intake/Output Summary (Last 24 hours) at 02/27/12 0811 Last data filed at 02/27/12 0700  Gross per 24 hour  Intake      3 ml  Output   1850 ml  Net  -1847 ml    LABS: Basic Metabolic Panel:  Lab [id number] 0515 02/26/12 0449 02/25/12 2322 02/25/12 2130  NA 141 137 -- 135  K 3.5 4.2 -- 4.3  CL 106 103 -- 100  CO2 26 23 -- 23  GLUCOSE 88 207* -- 215*  BUN 36* 33* -- 32*  CREATININE 1.83* 1.67* -- 1.64*  CALCIUM 8.6 9.3 -- --  MG 1.5 -- 1.2* --  PHOS -- -- 3.3 --   Cardiac Enzymes:  Basename 02/26/12 1232 02/26/12 0449 02/26/12 0014  CKTOTAL -- -- --  CKMB -- -- --  CKMBINDEX -- -- --  TROPONINI 0.65* 0.68* <0.30   CBC:  Lab 02/27/12 0515 02/25/12 2130  WBC 7.4 12.0*  NEUTROABS -- 9.4*  HGB 9.5* 11.0*  HCT 28.9* 32.7*  MCV 92.0 92.1  PLT 127* 144*     ASSESSMENT AND PLAN:  Patient Active Hospital Problem List: Chest pain (02/25/2012)  Atrial fibrillation -paroxysmal  CKD  (chronic kidney disease) stage 4  -single kidney Diabetes (02/25/2012)  Hypomagnesemia (02/26/2012)  Elevated troponin (02/26/2012)  Congestive heart failure (02/26/2012)   With elevated( and climbing Cr) it may be safer in this older diabetic woman to undertake myoview to stratify risk as opposed to cath. Cr 1 yr ago was 1.88   Cr Cl < 30 so metformin not an option, actos has been assoc with edema and weight gain  Echo P  Her PPCP has been averse to using ACR/ARB  may be most effective to have renal weigh in-- i would  favor use of ACE for renal protection   Signed, Virl Axe MD  02/27/2012

## 2012-02-27 NOTE — Progress Notes (Signed)
[facility]             Progress Note   Subjective: No complaints. Has returned to NSR.  Objective: Vital signs in last 24 hours: Temp:  [98.2 F (36.8 C)-98.7 F (37.1 C)] 98.7 F (37.1 C) (12/08 0600) Pulse Rate:  [70-80] 70  (12/08 1003) Resp:  [16-22] 16  (12/08 0600) BP: (120-170)/(40-75) 120/70 mmHg (12/08 1003) SpO2:  [98 %-99 %] 98 % (12/08 0600) Weight:  [94.394 kg (208 lb 1.6 oz)] 94.394 kg (208 lb 1.6 oz) (12/08 0600) Weight change: 0.045 kg (1.6 oz) Last BM Date: 02/26/12  Intake/Output from previous day: 12/07 0701 - 12/08 0700 In: 3 [I.V.:3] Out: 1850 [Urine:1850]     Physical Exam: General: Alert, awake, oriented x3, in no acute distress. HEENT: No bruits, no goiter. Heart: Regular rate and rhythm, without murmurs, rubs, gallops. Lungs: Clear to auscultation bilaterally. Abdomen: Soft, nontender, nondistended, positive bowel sounds. Extremities: 1+ bilateral pitting edema with positive pedal pulses. Neuro: Grossly intact, nonfocal.    Lab Results: Basic Metabolic Panel:  Basename 02/27/12 0515 02/26/12 0449 02/25/12 2322  NA 141 137 --  K 3.5 4.2 --  CL 106 103 --  CO2 26 23 --  GLUCOSE 88 207* --  BUN 36* 33* --  CREATININE 1.83* 1.67* --  CALCIUM 8.6 9.3 --  MG 1.5 -- 1.2*  PHOS -- -- 3.3   CBC:  Basename 02/27/12 0515 02/25/12 2130  WBC 7.4 12.0*  NEUTROABS -- 9.4*  HGB 9.5* 11.0*  HCT 28.9* 32.7*  MCV 92.0 92.1  PLT 127* 144*   Cardiac Enzymes:  Basename 02/26/12 1232 02/26/12 0449 02/26/12 0014  CKTOTAL -- -- --  CKMB -- -- --  CKMBINDEX -- -- --  TROPONINI 0.65* 0.68* <0.30   CBG:  Basename 02/27/12 0738 02/26/12 2108 02/26/12 1712 02/26/12 0003  GLUCAP 73 134* 88 194*    Studies/Results: Dg Chest Portable 1 View  02/25/2012  *RADIOLOGY REPORT*  Clinical Data: Chest pain, hypertension.  PORTABLE CHEST - 1 VIEW  Comparison: 09/08/2011  Findings: Mild cardiomegaly.  Low lung volumes.  No confluent  airspace opacities or effusions.  No overt edema.  No acute bony abnormality.  IMPRESSION: Cardiomegaly.  Low lung volumes.  No acute findings.   Original Report Authenticated By: Rolm Baptise, M.D.     Medications: Scheduled Meds:    . [COMPLETED] sodium chloride   Intravenous STAT  . diltiazem  60 mg Oral Q8H  . [COMPLETED] furosemide  40 mg Intravenous BID  . glimepiride  4 mg Oral BID  . insulin aspart  0-15 Units Subcutaneous TID WC  . insulin aspart  4 Units Subcutaneous TID WC  . linagliptin  5 mg Oral Daily  . [COMPLETED] magnesium sulfate 1 - 4 g bolus IVPB  2 g Intravenous Once  . metoprolol  50 mg Oral BID  . pioglitazone  15 mg Oral Daily  . sodium chloride  3 mL Intravenous Q12H   Continuous Infusions:    . heparin 1,450 Units/hr (02/26/12 1423)  . [DISCONTINUED] diltiazem (CARDIZEM) infusion 10 mg/hr (02/26/12 0653)   PRN Meds:.HYDROcodone-acetaminophen, ondansetron (ZOFRAN) IV, ondansetron  Assessment/Plan:  Principal Problem:  *Chest pain Active Problems:  Atrial fibrillation  CKD (chronic kidney disease) stage 3, GFR 30-59 ml/min  Leukocytosis  Anemia due to chronic illness  Diabetes  Hypomagnesemia  Elevated troponin  Congestive heart failure   New Onset A FIB -Has converted back to NSR. -Has a CHADS score of at  least 3. -Has been started on a heparin drip. -Will order 2D ECHO. -If non-valvular afib then we may consider one of the new anticoagulants to avoid the hassles of coumadin dosing. -Mg is 1.2. Will replete. K ok.  Elevated Troponins -Cards is planning on a stress myoview for the morning.  Hypomagnesemia -Repleted.  DM II -Well controlled.  DVT Prophylaxis -On heparin drip.  CKD Stage III -At baseline of around 1.6-1.8. -Cr in 2012 was 1.88. -ACE-I/ARB are contraindicated with this degree of renal insufficiency.  Time spent coordinating care: 35 minutes.   LOS: 2 days   [facility] Pager:  [phone] 02/27/2012, 10:25 AM

## 2012-02-28 ENCOUNTER — Observation Stay (HOSPITAL_COMMUNITY): Payer: Medicare Other

## 2012-02-28 DIAGNOSIS — I5032 Chronic diastolic (congestive) heart failure: Secondary | ICD-10-CM

## 2012-02-28 DIAGNOSIS — I509 Heart failure, unspecified: Secondary | ICD-10-CM

## 2012-02-28 DIAGNOSIS — R072 Precordial pain: Secondary | ICD-10-CM

## 2012-02-28 LAB — GLUCOSE, CAPILLARY
Glucose-Capillary: 101 mg/dL — ABNORMAL HIGH (ref 70–99)
Glucose-Capillary: 135 mg/dL — ABNORMAL HIGH (ref 70–99)

## 2012-02-28 LAB — CBC
HCT: 33.8 % — ABNORMAL LOW (ref 36.0–46.0)
Hemoglobin: 11.2 g/dL — ABNORMAL LOW (ref 12.0–15.0)
MCV: 92.6 fL (ref 78.0–100.0)
RBC: 3.65 MIL/uL — ABNORMAL LOW (ref 3.87–5.11)
WBC: 9.1 10*3/uL (ref 4.0–10.5)

## 2012-02-28 LAB — BASIC METABOLIC PANEL
CO2: 28 mEq/L (ref 19–32)
Calcium: 8.4 mg/dL (ref 8.4–10.5)
Chloride: 102 mEq/L (ref 96–112)
Creatinine, Ser: 1.74 mg/dL — ABNORMAL HIGH (ref 0.50–1.10)
Glucose, Bld: 155 mg/dL — ABNORMAL HIGH (ref 70–99)

## 2012-02-28 MED ORDER — REGADENOSON 0.4 MG/5ML IV SOLN
INTRAVENOUS | Status: AC
  Administered 2012-02-28: 0.4 mg via INTRAVENOUS
  Filled 2012-02-28: qty 5

## 2012-02-28 MED ORDER — ATORVASTATIN CALCIUM 80 MG PO TABS
80.0000 mg | ORAL_TABLET | Freq: Every day | ORAL | Status: DC
  Filled 2012-02-28 (×2): qty 1

## 2012-02-28 MED ORDER — TECHNETIUM TC 99M SESTAMIBI GENERIC - CARDIOLITE
10.0000 | Freq: Once | INTRAVENOUS | Status: AC | PRN
  Administered 2012-02-28: 10 via INTRAVENOUS

## 2012-02-28 MED ORDER — REGADENOSON 0.4 MG/5ML IV SOLN
0.4000 mg | Freq: Once | INTRAVENOUS | Status: AC
  Administered 2012-02-28: 0.4 mg via INTRAVENOUS

## 2012-02-28 MED ORDER — TECHNETIUM TC 99M SESTAMIBI GENERIC - CARDIOLITE
30.0000 | Freq: Once | INTRAVENOUS | Status: AC | PRN
  Administered 2012-02-28: 30 via INTRAVENOUS

## 2012-02-28 MED ORDER — ASPIRIN 81 MG PO CHEW
81.0000 mg | CHEWABLE_TABLET | Freq: Every day | ORAL | Status: DC
  Administered 2012-02-28: 81 mg via ORAL
  Filled 2012-02-28 (×2): qty 1

## 2012-02-28 NOTE — Progress Notes (Addendum)
ANTICOAGULATION CONSULT NOTE - FOLLOW UP  Pharmacy Consult for Heparin  Indication: atrial fibrillation  Patient Measurements: Height: 5\' 3"  (160 cm) Weight: 208 lb 5.4 oz (94.5 kg) IBW/kg (Calculated) : 52.4  Heparin Dosing Weight = 64 kg  Vital Signs: Temp: 98.5 F (36.9 C) (12/09 0600) BP: 141/52 mmHg (12/09 0921) Pulse Rate: 75  (12/09 0600)  Labs:  Basename 02/28/12 1153 02/27/12 0515 02/26/12 1232 02/26/12 0449 02/26/12 0014 02/25/12 2130  HGB -- 9.5* -- -- -- 11.0*  HCT -- 28.9* -- -- -- 32.7*  PLT -- 127* -- -- -- 144*  APTT -- -- -- -- -- --  LABPROT -- -- -- -- -- --  INR -- -- -- -- -- --  HEPARINUNFRC 0.44 0.60 -- 0.77* -- --  CREATININE 1.74* 1.83* -- 1.67* -- --  CKTOTAL -- -- -- -- -- --  CKMB -- -- -- -- -- --  TROPONINI -- -- 0.65* 0.68* <0.30 --   Estimated Creatinine Clearance: 28.6 ml/min (by C-G formula based on Cr of 1.74).  Assessment: 78 yo F therapeutic on heparin for afib.  No CBC drawn this am.  No bleeding complications noted.  Goal of Therapy:  Heparin level 0.3-0.7 units/ml Monitor platelets by anticoagulation protocol: Yes   Plan - Continue IV heparin at 1450 units/hr - Check daily heparin level and CBC - Order stat CBC for now - F/u results of myoview, guiac stools, long-term AC plans  Kinta Martis L. Amada Jupiter, PharmD, [place] Clinical Pharmacist Pager: [phone] Pharmacy: [phone] 02/28/2012 1:15 PM    Addendum: CBC stable hgb: 11.2, plt 135.  F/u cbc in am.

## 2012-02-28 NOTE — Progress Notes (Signed)
Pt doesn't want to take Lipitor until explained reason, pt states had cholesterol checked at PCP last week and he told her it was fine and didn't need to be on anything. Carroll Kinds RN

## 2012-02-28 NOTE — Progress Notes (Signed)
Patient Name: Miranda Garcia Date of Encounter: 02/28/2012  Principal Problem:  *Chest pain Active Problems:  Atrial fibrillation  Diabetes  Elevated troponin  Chronic diastolic CHF (congestive heart failure)  CKD (chronic kidney disease) stage 3, GFR 30-59 ml/min  Leukocytosis  Anemia due to chronic illness  Hypomagnesemia   SUBJECTIVE  No chest pain or sob.  Maintaining sinus.  For MV today.  CURRENT MEDS    . diltiazem  60 mg Oral Q8H  . [COMPLETED] furosemide  40 mg Intravenous BID  . glimepiride  4 mg Oral BID  . insulin aspart  0-15 Units Subcutaneous TID WC  . insulin aspart  4 Units Subcutaneous TID WC  . linagliptin  5 mg Oral Daily  . metoprolol  50 mg Oral BID  . pioglitazone  15 mg Oral Daily  . sodium chloride  3 mL Intravenous Q12H   OBJECTIVE  Filed Vitals:   02/27/12 1003 02/27/12 1413 02/27/12 2100 02/28/12 0600  BP: 120/70 132/64 140/50 128/48  Pulse: 70 76 77 75  Temp:  98 F (36.7 C) 98.5 F (36.9 C) 98.5 F (36.9 C)  TempSrc:      Resp:  20 16 16   Height:      Weight:    208 lb 5.4 oz (94.5 kg)  SpO2:  98% 99% 97%    Intake/Output Summary (Last 24 hours) at 02/28/12 0754 Last data filed at 02/27/12 1300  Gross per 24 hour  Intake    240 ml  Output   1300 ml  Net  -1060 ml   Filed Weights   02/25/12 2345 02/27/12 0600 02/28/12 0600  Weight: 205 lb 8 oz (93.214 kg) 208 lb 1.6 oz (94.394 kg) 208 lb 5.4 oz (94.5 kg)    PHYSICAL EXAM  General: Pleasant, NAD. Neuro: Alert and oriented X 3. Moves all extremities spontaneously. Psych: Normal affect. HEENT:  Normal  Neck: Supple without bruits or JVD. Lungs:  Resp regular and unlabored, CTA. Heart: RRR no s3, s4, or murmurs. Abdomen: Soft, non-tender, non-distended, BS + x 4.  Extremities: No clubbing, cyanosis or trace to 1+ bilat LE edema. DP/PT/Radials 2+ and equal bilaterally.  Accessory Clinical Findings  CBC  Basename 02/27/12 0515 02/25/12 2130  WBC 7.4 12.0*    NEUTROABS -- 9.4*  HGB 9.5* 11.0*  HCT 28.9* 32.7*  MCV 92.0 92.1  PLT 127* 123456*   Basic Metabolic Panel  Basename [id number] 0515 02/26/12 0449 02/25/12 2322  NA 141 137 --  K 3.5 4.2 --  CL 106 103 --  CO2 26 23 --  GLUCOSE 88 207* --  BUN 36* 33* --  CREATININE 1.83* 1.67* --  CALCIUM 8.6 9.3 --  MG 1.5 -- 1.2*  PHOS -- -- 3.3   Cardiac Enzymes  Basename 02/26/12 1232 02/26/12 0449 02/26/12 0014  CKTOTAL -- -- --  CKMB -- -- --  CKMBINDEX -- -- --  TROPONINI 0.65* 0.68* <0.30   Thyroid Function Tests  Basename 02/25/12 2322  TSH 0.671  T4TOTAL --  T3FREE --  THYROIDAB --   TELE  rsr.  2D Echocardiogram 02/26/2012  Study Conclusions  - Left ventricle: The cavity size was normal. Wall thickness was increased in a pattern of mild LVH. Systolic function was normal. The estimated ejection fraction was in the range of 60% to 65%. Wall motion was normal; there were no regional wall motion abnormalities. Features are consistent with a pseudonormal left ventricular filling pattern, with concomitant abnormal relaxation and  increased filling pressure (grade 2 diastolic dysfunction). - Aortic valve: There was no stenosis. - Mitral valve: Moderately calcified annulus. Trivial regurgitation. - Left atrium: The atrium was moderately dilated. - Right ventricle: The cavity size was normal. Systolic function was normal. - Pulmonary arteries: No complete TR doppler jet so unable to estimate PA systolic pressure. - Systemic veins: IVC measured 2.3 cm with some respirophasic variation, suggesting RApressure 10 mmHg.   Radiology/Studies  Dg Chest Portable 1 View  02/25/2012  *RADIOLOGY REPORT*  Clinical Data: Chest pain, hypertension.  PORTABLE CHEST - 1 VIEW  Comparison: 09/08/2011  Findings: Mild cardiomegaly.  Low lung volumes.  No confluent airspace opacities or effusions.  No overt edema.  No acute bony abnormality.  IMPRESSION: Cardiomegaly.  Low lung volumes.   No acute findings.   Original Report Authenticated By: Rolm Baptise, M.D.    ASSESSMENT AND PLAN  1.  Acute Atrial fibrillation:  Resolved - maintaining sinus rhythm.  Cont bb/dilt (consolidate).  CHADS2 = 3-4 (pressures borderline).  Currently on heparin with plan for oral anticoagulation once ischemic w/u complete.  If NOAT chosen, would have to be renally dosed.  2.  Acute Type II NSTEMI:  In setting of afib rvr.  Given CKD III, plan for risk stratification today with myoview.  Would only pursue cath if high risk.  Echo shows nl LV fxn.  Cont bb.  Add asa and statin.  Check lipids/lft's.  3.  DM:  Per IM.  Consider switching off of actos given propensity to diast chf.  Pt says this was started by Dr. Woody Seller (PCP in [place]) and she gained 30 lbs shortly thereafter with significant edema.  He then placed her on HCTZ prn ankle swelling.  4.  Acute on chronic diastolic CHF:  Since being placed on actos, she reports 30 lb weight gain (wt previously in the 170's) and has had significant LEE.  She is 208 lbs today.  She does have ankle edema and has received lasix x 2.  Creat has been rising.  She would likely benefit from additional diuresis.  5.  Acute on chronic normocytic anemia:  H/H down slightly.  Guaiac stools.  Signed, Murray Hodgkins NP As above, patient seen and examined; no chest pain; for myoview today for elevated troponin; plan medical therapy (ASA, statin and lopressor) if normal or low risk given renal insufficieny. Patient remains in sinus; continue metoprolol and cardizem; if myoview negative, would anticoagulate (given renal insufficiency, would favor coumadin). Will leave therapy for DM to primary care but may be better to avoid actos. Aaron Edelman Crenshaw 10:14 AM

## 2012-02-28 NOTE — Progress Notes (Addendum)
[facility]             Progress Note   Subjective: No complaints. Has stress myoview earlier today.  Objective: Vital signs in last 24 hours: Temp:  [98 F (36.7 C)-98.5 F (36.9 C)] 98.5 F (36.9 C) (12/09 0600) Pulse Rate:  [75-77] 75  (12/09 0600) Resp:  [16-20] 16  (12/09 0600) BP: (128-154)/(37-64) 141/52 mmHg (12/09 0921) SpO2:  [97 %-99 %] 97 % (12/09 0600) Weight:  [94.5 kg (208 lb 5.4 oz)] 94.5 kg (208 lb 5.4 oz) (12/09 0600) Weight change: 0.107 kg (3.8 oz) Last BM Date: 02/26/12  Intake/Output from previous day: 12/08 0701 - 12/09 0700 In: 414 [P.O.:240; I.V.:174] Out: 2700 [Urine:2700]     Physical Exam: General: Alert, awake, oriented x3, in no acute distress. HEENT: No bruits, no goiter. Heart: Regular rate and rhythm, without murmurs, rubs, gallops. Lungs: Clear to auscultation bilaterally. Abdomen: Soft, nontender, nondistended, positive bowel sounds. Extremities: 1+ bilateral pitting edema with positive pedal pulses. Neuro: Grossly intact, nonfocal.    Lab Results: Basic Metabolic Panel:  Basename 02/28/12 1153 02/27/12 0515 02/25/12 2322  NA 139 141 --  K 3.6 3.5 --  CL 102 106 --  CO2 28 26 --  GLUCOSE 155* 88 --  BUN 33* 36* --  CREATININE 1.74* 1.83* --  CALCIUM 8.4 8.6 --  MG -- 1.5 1.2*  PHOS -- -- 3.3   CBC:  Basename 02/27/12 0515 02/25/12 2130  WBC 7.4 12.0*  NEUTROABS -- 9.4*  HGB 9.5* 11.0*  HCT 28.9* 32.7*  MCV 92.0 92.1  PLT 127* 144*   Cardiac Enzymes:  Basename 02/26/12 1232 02/26/12 0449 02/26/12 0014  CKTOTAL -- -- --  CKMB -- -- --  CKMBINDEX -- -- --  TROPONINI 0.65* 0.68* <0.30   CBG:  Basename 02/28/12 1128 02/28/12 0722 02/27/12 2049 02/27/12 1656 02/27/12 1136 02/27/12 0738  GLUCAP 154* 101* 177* 139* 168* 73    Studies/Results: No results found.  Medications: Scheduled Meds:    . aspirin  81 mg Oral Daily  . atorvastatin  80 mg Oral q1800  . diltiazem  60 mg Oral Q8H  .  glimepiride  4 mg Oral BID  . insulin aspart  0-15 Units Subcutaneous TID WC  . insulin aspart  4 Units Subcutaneous TID WC  . linagliptin  5 mg Oral Daily  . metoprolol  50 mg Oral BID  . pioglitazone  15 mg Oral Daily  . [COMPLETED] regadenoson  0.4 mg Intravenous Once  . sodium chloride  3 mL Intravenous Q12H   Continuous Infusions:    . heparin 1,450 Units/hr (02/27/12 1521)   PRN Meds:.HYDROcodone-acetaminophen, ondansetron (ZOFRAN) IV, ondansetron, [COMPLETED] technetium sestamibi generic, [COMPLETED] technetium sestamibi generic  Assessment/Plan:  Principal Problem:  *Chest pain Active Problems:  Atrial fibrillation  CKD (chronic kidney disease) stage 3, GFR 30-59 ml/min  Leukocytosis  Anemia due to chronic illness  Diabetes  Hypomagnesemia  Elevated troponin  Chronic diastolic CHF (congestive heart failure)   New Onset A FIB -Has converted back to NSR. -Has a CHADS score of at least 3. -Has been started on a heparin drip. -Will order 2D ECHO. -Will need long-term anticoagulation (await results of stress test, if no plan to proceed to cath, we can start coumadin). -Repleted.  Elevated Troponins -Await results of myoview.  Hypomagnesemia -Repleted.  DM II -Well controlled. -Will DC actos given history of fluid retention and significant diastolic dysfunction on ECHO (Black Box Warning).  DVT Prophylaxis -  On heparin drip.  CKD Stage III -At baseline of around 1.6-1.8. -Cr in 2012 was 1.88. -ACE-I/ARB are contraindicated with this degree of renal insufficiency.  Disposition -Await results of myoview. -If no plans to proceed with cath, will start coumadin and bridge with lovenox to facilitate DC home in am with close monitoring of INR.  Time spent coordinating care: 35 minutes.   LOS: 3 days   [facility] Pager: [phone] 02/28/2012, 1:25 PM

## 2012-02-29 LAB — COMPREHENSIVE METABOLIC PANEL
ALT: 12 U/L (ref 0–35)
AST: 19 U/L (ref 0–37)
CO2: 27 mEq/L (ref 19–32)
Chloride: 106 mEq/L (ref 96–112)
GFR calc non Af Amer: 30 mL/min — ABNORMAL LOW (ref >90)
Potassium: 4.2 mEq/L (ref 3.5–5.1)
Sodium: 141 mEq/L (ref 135–145)
Total Bilirubin: 0.5 mg/dL (ref 0.3–1.2)

## 2012-02-29 LAB — CBC
Hemoglobin: 10.5 g/dL — ABNORMAL LOW (ref 12.0–15.0)
MCH: 31 pg (ref 26.0–34.0)
MCHC: 33.4 g/dL (ref 30.0–36.0)
MCV: 92.6 fL (ref 78.0–100.0)
Platelets: 146 10*3/uL — ABNORMAL LOW (ref 150–400)

## 2012-02-29 LAB — LIPID PANEL
LDL Cholesterol: 74 mg/dL (ref 0–99)
Total CHOL/HDL Ratio: 3.3 RATIO

## 2012-02-29 LAB — GLUCOSE, CAPILLARY: Glucose-Capillary: 103 mg/dL — ABNORMAL HIGH (ref 70–99)

## 2012-02-29 LAB — HEPARIN LEVEL (UNFRACTIONATED): Heparin Unfractionated: 0.46 IU/mL (ref 0.30–0.70)

## 2012-02-29 MED ORDER — PATIENT'S GUIDE TO USING COUMADIN BOOK
Freq: Once | Status: DC
  Filled 2012-02-29: qty 1

## 2012-02-29 MED ORDER — DILTIAZEM HCL ER COATED BEADS 180 MG PO CP24
180.0000 mg | ORAL_CAPSULE | Freq: Every day | ORAL | Status: DC
  Administered 2012-02-29: 180 mg via ORAL
  Filled 2012-02-29: qty 1

## 2012-02-29 MED ORDER — DILTIAZEM HCL ER COATED BEADS 180 MG PO CP24: 180.0000 mg | ORAL_CAPSULE | Freq: Every day | ORAL | Status: DC

## 2012-02-29 MED ORDER — WARFARIN SODIUM 2.5 MG PO TABS: 2.5000 mg | ORAL_TABLET | Freq: Every day | ORAL | Status: DC

## 2012-02-29 MED ORDER — ATORVASTATIN CALCIUM 20 MG PO TABS
20.0000 mg | ORAL_TABLET | Freq: Every day | ORAL | Status: DC
  Administered 2012-02-29: 20 mg via ORAL
  Filled 2012-02-29: qty 1

## 2012-02-29 MED ORDER — WARFARIN VIDEO: Freq: Once | Status: DC

## 2012-02-29 MED ORDER — ATORVASTATIN CALCIUM 20 MG PO TABS: 20.0000 mg | ORAL_TABLET | Freq: Every day | ORAL | Status: DC

## 2012-02-29 MED ORDER — WARFARIN SODIUM 5 MG PO TABS
5.0000 mg | ORAL_TABLET | Freq: Once | ORAL | Status: DC
  Filled 2012-02-29: qty 1

## 2012-02-29 MED ORDER — WARFARIN SODIUM 2.5 MG PO TABS
2.5000 mg | ORAL_TABLET | Freq: Once | ORAL | Status: DC
  Filled 2012-02-29: qty 1

## 2012-02-29 MED ORDER — WARFARIN - PHARMACIST DOSING INPATIENT: Freq: Every day | Status: DC

## 2012-02-29 NOTE — Progress Notes (Addendum)
ANTICOAGULATION CONSULT NOTE - FOLLOW UP  Pharmacy Consult for Heparin  Indication: atrial fibrillation  Patient Measurements: Height: 5\' 3"  (160 cm) Weight: 208 lb 5.4 oz (94.5 kg) IBW/kg (Calculated) : 52.4  Heparin Dosing Weight = 64 kg  Vital Signs: Temp: 98.3 F (36.8 C) (12/10 0500) Temp src: Oral (12/10 0500) BP: 146/53 mmHg (12/10 0500) Pulse Rate: 71  (12/10 0500)  Labs:  Basename 02/29/12 0427 02/28/12 1406 02/28/12 1153 02/27/12 0515 02/26/12 1232  HGB 10.5* 11.2* -- -- --  HCT 31.4* 33.8* -- 28.9* --  PLT 146* 135* -- 127* --  APTT -- -- -- -- --  LABPROT -- -- -- -- --  INR -- -- -- -- --  HEPARINUNFRC 0.46 -- 0.44 0.60 --  CREATININE 1.60* -- 1.74* 1.83* --  CKTOTAL -- -- -- -- --  CKMB -- -- -- -- --  TROPONINI -- -- -- -- 0.65*   Estimated Creatinine Clearance: 31.1 ml/min (by C-G formula based on Cr of 1.6).  Assessment: 78 yo F therapeutic on heparin for afib.  No bleeding complications noted.  Noted orders to d/c heparin and start Coumadin for d/c today.  Goal of Therapy:  INR 2-3 Monitor platelets by anticoagulation protocol: Yes   Plan - Coumadin 2.5 mg po x1 today at 1800 (spoke with pt, will take this dose if here at 6pm today otherwise, will start with outpt rx tonight) - Daily INR while in patient - Suggest INR by end of week if d/c'd today - Provide Coumadin education  Bryson Ha L. Amada Jupiter, PharmD, [place] Clinical Pharmacist Pager: [phone] Pharmacy: [phone] 02/29/2012 10:44 AM

## 2012-02-29 NOTE — Progress Notes (Signed)
Patient Name: Miranda Garcia Date of Encounter: 02/29/2012  Principal Problem:  *Chest pain Active Problems:  Atrial fibrillation  CKD (chronic kidney disease) stage 3, GFR 30-59 ml/min  Leukocytosis  Anemia due to chronic illness  Diabetes  Hypomagnesemia  Elevated troponin  Chronic diastolic CHF (congestive heart failure)   SUBJECTIVE  No chest pain or sob.  Maintaining sinus.    CURRENT MEDS    . aspirin  81 mg Oral Daily  . atorvastatin  80 mg Oral q1800  . diltiazem  60 mg Oral Q8H  . glimepiride  4 mg Oral BID  . insulin aspart  0-15 Units Subcutaneous TID WC  . insulin aspart  4 Units Subcutaneous TID WC  . linagliptin  5 mg Oral Daily  . metoprolol  50 mg Oral BID  . sodium chloride  3 mL Intravenous Q12H  . [DISCONTINUED] pioglitazone  15 mg Oral Daily   OBJECTIVE  Filed Vitals:   02/28/12 0921 02/28/12 1401 02/28/12 2100 02/29/12 0500  BP: 141/52 130/70 143/44 146/53  Pulse:  72 72 71  Temp:  98.1 F (36.7 C) 98.3 F (36.8 C) 98.3 F (36.8 C)  TempSrc:  Oral Oral Oral  Resp:  20    Height:      Weight:      SpO2:  94% 98% 97%    Intake/Output Summary (Last 24 hours) at 02/29/12 0935 Last data filed at 02/29/12 0838  Gross per 24 hour  Intake    565 ml  Output   2050 ml  Net  -1485 ml   Filed Weights   02/25/12 2345 02/27/12 0600 02/28/12 0600  Weight: 205 lb 8 oz (93.214 kg) 208 lb 1.6 oz (94.394 kg) 208 lb 5.4 oz (94.5 kg)    PHYSICAL EXAM  General: Pleasant, NAD. Neuro: Alert and oriented X 3. Moves all extremities spontaneously. HEENT:  Normal  Neck: Supple  Lungs:  Resp regular and unlabored, CTA. Heart: RRR no s3, s4, or murmurs. Abdomen: Soft, non-tender, non-distended Extremities: No edema  Accessory Clinical Findings  CBC  Basename 02/29/12 0427 02/28/12 1406  WBC 7.1 9.1  NEUTROABS -- --  HGB 10.5* 11.2*  HCT 31.4* 33.8*  MCV 92.6 92.6  PLT 146* [id number]*   Basic Metabolic Panel  Basename 0000000 0427 02/28/12  1153 02/27/12 0515  NA 141 139 --  K 4.2 3.6 --  CL 106 102 --  CO2 27 28 --  GLUCOSE 95 155* --  BUN 30* 33* --  CREATININE 1.60* 1.74* --  CALCIUM 8.0* 8.4 --  MG -- -- 1.5  PHOS -- -- --   Cardiac Enzymes  Basename 02/26/12 1232  CKTOTAL --  CKMB --  CKMBINDEX --  TROPONINI 0.65*     2D Echocardiogram 02/26/2012  Study Conclusions  - Left ventricle: The cavity size was normal. Wall thickness was increased in a pattern of mild LVH. Systolic function was normal. The estimated ejection fraction was in the range of 60% to 65%. Wall motion was normal; there were no regional wall motion abnormalities. Features are consistent with a pseudonormal left ventricular filling pattern, with concomitant abnormal relaxation and increased filling pressure (grade 2 diastolic dysfunction). - Aortic valve: There was no stenosis. - Mitral valve: Moderately calcified annulus. Trivial regurgitation. - Left atrium: The atrium was moderately dilated. - Right ventricle: The cavity size was normal. Systolic function was normal. - Pulmonary arteries: No complete TR doppler jet so unable to estimate PA systolic pressure. - Systemic  veins: IVC measured 2.3 cm with some respirophasic variation, suggesting RApressure 10 mmHg.   Radiology/Studies  Dg Chest Portable 1 View  02/25/2012  *RADIOLOGY REPORT*  Clinical Data: Chest pain, hypertension.  PORTABLE CHEST - 1 VIEW  Comparison: 09/08/2011  Findings: Mild cardiomegaly.  Low lung volumes.  No confluent airspace opacities or effusions.  No overt edema.  No acute bony abnormality.  IMPRESSION: Cardiomegaly.  Low lung volumes.  No acute findings.   Original Report Authenticated By: Rolm Baptise, M.D.    ASSESSMENT AND PLAN  1.  Acute Atrial fibrillation:  Resolved - maintaining sinus rhythm.  Cont bb/dilt (change to CD).  CHADS2 = 3-4 (pressures borderline).  Given higher CHADS score, will need coumadin (will avoid NOAC given renal  insufficiency). Begin coumadin 2.5 mg po daily; check INR in [facility] office Friday 03/03/12. Will DC asa given need for coumadin.  2.  Acute Type II NSTEMI:  In setting of afib rvr. Echo shows nl LV fxn.  Myoview shows mild lateral ischemia. I do not think it is a high risk study. Given renal insufficiency and risk of cotrast nephropathy, plan medical therapy unless symptoms worsen. Cont bb and statin (change lipitor to 20 mg po daily and check lipids and liver in six weeks).    3.  DM:  Per IM.   4.  Acute on chronic normocytic anemia:  H/H down slightly.  Guaiac stools.  Patient will need fu in our [place] office in 4-6 weeks; check INR in [place] office on 03/03/12.  Kirk Ruths 9:35 AM

## 2012-02-29 NOTE — Discharge Summary (Addendum)
Physician Discharge Summary  Patient ID: Miranda Garcia MRN: [id number] DOB/AGE: 1930-04-05 78 y.o.  Admit date: 02/25/2012 Discharge date: 02/29/2012  Primary Care Physician:  Glenda Chroman., MD   Discharge Diagnoses:    Principal Problem:  *Chest pain Active Problems:  Atrial fibrillation  CKD (chronic kidney disease) stage 3, GFR 30-59 ml/min  Leukocytosis  Anemia due to chronic illness  Diabetes  Hypomagnesemia  Elevated troponin  Chronic diastolic CHF (congestive heart failure)      Medication List     As of 02/29/2012 11:00 AM    STOP taking these medications         pioglitazone 15 MG tablet   Commonly known as: ACTOS      TAKE these medications         atorvastatin 20 MG tablet   Commonly known as: LIPITOR   Take 1 tablet (20 mg total) by mouth daily at 6 PM.      calcium citrate-vitamin D 315-200 MG-UNIT per tablet   Commonly known as: CITRACAL+D   Take 1 tablet by mouth 2 (two) times daily.      diltiazem 180 MG 24 hr capsule   Commonly known as: CARDIZEM CD   Take 1 capsule (180 mg total) by mouth daily.      glimepiride 4 MG tablet   Commonly known as: AMARYL   Take 4 mg by mouth 2 (two) times daily.      Glucosamine HCl 1000 MG Tabs   Take 2 tablets by mouth daily.      metoprolol 50 MG tablet   Commonly known as: LOPRESSOR   Take 50 mg by mouth 2 (two) times daily.      multivitamin with minerals Tabs   Take 1 tablet by mouth daily.      ONGLYZA 5 MG Tabs tablet   Generic drug: saxagliptin HCl   Take 5 mg by mouth daily.      VITAMIN E PO   Take 1 capsule by mouth daily.      warfarin 2.5 MG tablet   Commonly known as: COUMADIN   Take 1 tablet (2.5 mg total) by mouth daily.          Disposition and Follow-up:  Will be discharged home today in stable and improved condition. Has been started on coumadin and should follow up with [facility] coumadin clinic in [place] on Friday 12/14 for her INR check and further coumadin  dosing.  Consults:  Cardiology, Dr. Stanford Breed.   Significant Diagnostic Studies:  Nm Myocar Multi W/spect W/wall Motion / Ef  02/28/2012  78 year old female with diabetes presenting with atrial fibrillation and chest discomfort.  This study is performed to exclude ischemia.  This is a same day rest stress protocol.  30 mCi of Myoview were used for the stress images and 10 mCi of Myoview were used for the rest images.  Lexiscan was administered in the typical fashion.  The patients resting heart rate was 67 and blood pressure 154/57. Following infusion the patient's heart rate was 83 with a blood pressure of 141/52.  There was no chest pain during the study. There were no ST changes.  The study was terminated per protocol.  Scintigraphic results: the images were reconstructed in the short axis as well as the vertical and horizontal long axis.  The stress images reveal a small defect in the distal anterior wall which was fixed.  There is also a defect in the inferolateral wall predominantly towards the base.  There is reversibility in this distribution.  The gated ejection fraction was 80% and the wall motion was normal.  End-systolic volume 16 ml.  End-diastolic volume 78 ml.  T I D - 0.96.  Final interpretation:  Abnormal Lexiscan Myoview with no diagnostic electrocardiographic changes.  The scintigraphic results show breast attenuation.  There is also mild ischemia in the mid and basal inferolateral wall.  The gated ejection fraction was 80% and the wall motion was normal.   Original Report Authenticated By: Kirk Ruths     Brief H and P: For complete details please refer to admission H and P, but in brief patient is a 78 yo female who presents to [facility] ED with main concern of sudden onset chest discomfort that started several hours prior to admission, dull and pressure like in nature, intermittent and 7/10 in severity when present, non radiating but associated with palpitations. Pt denies fevers, chills,  abdominal or urinary concerns, no similar events in the past, no shortness of breath. Pt also denies specific focal weakness, dizziness, or visual changes.  ED EVENTS: pt found to be in atrial fibrillation and was started on Heparin drip as CHADS score is 4, pt reported pain improved with pain medications given in ED, pt was also given Diltiazem 20 mg IV x 1 dose. We were asked to admit her for further evaluation and management.     Hospital Course:  Principal Problem:  *Chest pain Active Problems:  Atrial fibrillation  CKD (chronic kidney disease) stage 3, GFR 30-59 ml/min  Leukocytosis  Anemia due to chronic illness  Diabetes  Hypomagnesemia  Elevated troponin  Chronic diastolic CHF (congestive heart failure)   New Onset A FIB  -Has converted back to NSR.  -Has a CHADS score of at least 3.  -Has been started on a heparin drip while in the hospital.  -Will be started on coumadin as newer anticoagulants are contraindicated with her degree of renal insufficiency. -Will go on coumadin 2.5 mg daily until Friday when she will follow up for her INR check. -Does not need a heparin bridge. -Has also been started on cardizem.  NSTEMI -Myoview shows mild ischemia in the inferolateral wall. -Patient and cardiology have decided to do medical management. -On BB/statin. -We have discontinued ASA as we will start coumadin.  Hypomagnesemia  -Repleted.   DM II  -Well controlled.  -Will DC actos given history of fluid retention and significant diastolic dysfunction on ECHO (Black Box Warning).   Chronic Diastolic CHF -New diagnosis as per ECHO.  DVT Prophylaxis  -On heparin drip.   CKD Stage III  -At baseline of around 1.6-1.8.  -Cr in 2012 was 1.88.  -ACE-I/ARB are contraindicated with this degree of renal insufficiency.    Time spent on Discharge: Greater than 30 minutes.  SignedLelon Frohlich [facility] Pager: [phone] 02/29/2012, 11:00 AM

## 2012-02-29 NOTE — Care Management Note (Unsigned)
    Page 1 of 1   02/29/2012     10:15:06 AM   CARE MANAGEMENT NOTE 02/29/2012  Patient:  Miranda Garcia, Miranda Garcia   Account Number:  [id number]  Date Initiated:  02/29/2012  Documentation initiated by:  GRAVES-BIGELOW,Byron Tipping  Subjective/Objective Assessment:   Pt admitted with cp and found to be in afib.     Action/Plan:   CM will continue to monitor for disposition needs.   Anticipated DC Date:  03/02/2012   Anticipated DC Plan:  [place]  CM consult      Choice offered to / List presented to:             Status of service:  In process, will continue to follow Medicare Important Message given?   (If response is "NO", the following Medicare IM given date fields will be blank) Date Medicare IM given:   Date Additional Medicare IM given:    Discharge Disposition:    Per UR Regulation:  Reviewed for med. necessity/level of care/duration of stay  If discussed at [place] of Stay Meetings, dates discussed:    Comments:

## 2012-03-02 ENCOUNTER — Telehealth: Payer: Self-pay | Admitting: Internal Medicine

## 2012-03-02 NOTE — Telephone Encounter (Signed)
Advised pt that [facility] should be making an appt.for her at her coumadin appt tomorrow.

## 2012-03-02 NOTE — Telephone Encounter (Signed)
New problem:   Seen by Dr. Caryl Comes in the hospital . C/O heart racing last about an hour.

## 2012-03-03 ENCOUNTER — Ambulatory Visit (INDEPENDENT_AMBULATORY_CARE_PROVIDER_SITE_OTHER): Payer: Medicare Other | Admitting: *Deleted

## 2012-03-03 DIAGNOSIS — I4891 Unspecified atrial fibrillation: Secondary | ICD-10-CM

## 2012-03-03 DIAGNOSIS — Z7901 Long term (current) use of anticoagulants: Secondary | ICD-10-CM

## 2012-03-07 ENCOUNTER — Ambulatory Visit (INDEPENDENT_AMBULATORY_CARE_PROVIDER_SITE_OTHER): Payer: Medicare Other | Admitting: *Deleted

## 2012-03-07 DIAGNOSIS — Z7901 Long term (current) use of anticoagulants: Secondary | ICD-10-CM

## 2012-03-07 DIAGNOSIS — I4891 Unspecified atrial fibrillation: Secondary | ICD-10-CM

## 2012-03-07 LAB — POCT INR: INR: 1.3

## 2012-03-07 MED ORDER — WARFARIN SODIUM 5 MG PO TABS: 5.0000 mg | ORAL_TABLET | Freq: Every day | ORAL | Status: DC

## 2012-03-17 ENCOUNTER — Ambulatory Visit (INDEPENDENT_AMBULATORY_CARE_PROVIDER_SITE_OTHER): Payer: Medicare Other | Admitting: *Deleted

## 2012-03-17 DIAGNOSIS — I4891 Unspecified atrial fibrillation: Secondary | ICD-10-CM

## 2012-03-17 DIAGNOSIS — Z7901 Long term (current) use of anticoagulants: Secondary | ICD-10-CM

## 2012-03-31 ENCOUNTER — Ambulatory Visit (INDEPENDENT_AMBULATORY_CARE_PROVIDER_SITE_OTHER): Payer: Medicare Other | Admitting: *Deleted

## 2012-03-31 DIAGNOSIS — Z7901 Long term (current) use of anticoagulants: Secondary | ICD-10-CM

## 2012-03-31 DIAGNOSIS — I4891 Unspecified atrial fibrillation: Secondary | ICD-10-CM

## 2012-04-12 ENCOUNTER — Ambulatory Visit (INDEPENDENT_AMBULATORY_CARE_PROVIDER_SITE_OTHER): Payer: Medicare Other | Admitting: Cardiology

## 2012-04-12 ENCOUNTER — Encounter: Payer: Self-pay | Admitting: Cardiology

## 2012-04-12 VITALS — BP 152/81 | HR 73 | Ht 63.0 in | Wt 200.0 lb

## 2012-04-12 DIAGNOSIS — I509 Heart failure, unspecified: Secondary | ICD-10-CM

## 2012-04-12 DIAGNOSIS — E119 Type 2 diabetes mellitus without complications: Secondary | ICD-10-CM

## 2012-04-12 DIAGNOSIS — I5032 Chronic diastolic (congestive) heart failure: Secondary | ICD-10-CM

## 2012-04-12 DIAGNOSIS — I4891 Unspecified atrial fibrillation: Secondary | ICD-10-CM

## 2012-04-12 NOTE — Progress Notes (Signed)
Patient ID: Miranda Garcia, female   DOB: 01-30-1931, 79 y.o.   MRN: [id number] PCP: Dr. Woody Seller  79 yo with history of HTN and diabetes was admitted in 12/13 with chest pain for several hours along with dyspnea and tachypalpitations.  She was found to be in atrial fibrillation with RVR.  Mild increase in troponin to 0.68.  She spontaneously converted to NSR.  She was noted to be volume overloaded.  She was diuresed in the hospital.  Given mild TnI increased, Lexiscan Sestamibi was done.  This showed mild inferolateral ischemia.  It was overall a low risk study and catheterization was not undertaken given CKD.  Echo showed normal EF.    Since discharge, she has had no further chest pain.  No tachypalpitations.  She is in NSR today.  She is watching her diet and weight has been going down.  She was not sent home on Lasix.  She is mildly short of breath walking up a hill or up a long flight of steps but no dyspnea walking on flat ground or doing housework.  No orthopnea or PND.    ECG: NSR, incomplete LBBB, LAFB  Labs (12/13): K 4.2, creatinine 1.6, LDL 74, HDL 45  PMH: 1. HTN 2. Type II diabetes 3. Paroxysmal atrial fibrillation: CHADSVASc 5. First noted in 12/13.   4. Arthritis 5. H/o back surgery 6. CKD 7. Diastolic CHF: Echo (123456) with EF 60-65%, grade II diastolic dysfunction, normal RV size and systolic function.  Volume overload in setting of afib/RVR and Actos use.  8. Nephrolithiasis. 9. CAD: Lexiscan Sestamibi 12/13 with EF 80%, mild basal to mid inferolateral ischemia.  Low risk study, managed medically given CKD.   SH: Widow, lives in [place].  Nonsmoker.    FH: Multiple family member with cirrhosis.   ROS: All systems reviewed and negative except as per HPI.   Current Outpatient Prescriptions  Medication Sig Dispense Refill  . atorvastatin (LIPITOR) 20 MG tablet Take 1 tablet (20 mg total) by mouth daily at 6 PM.  30 tablet  1  . calcium citrate-vitamin D (CITRACAL+D)  315-200 MG-UNIT per tablet Take 1 tablet by mouth 2 (two) times daily.        . cholecalciferol (VITAMIN D) 1000 UNITS tablet Take 2,000 Units by mouth daily.      Marland Kitchen diltiazem (CARDIZEM CD) 180 MG 24 hr capsule Take 1 capsule (180 mg total) by mouth daily.  30 capsule  1  . glimepiride (AMARYL) 4 MG tablet Take 4 mg by mouth 2 (two) times daily.        . metoprolol (LOPRESSOR) 50 MG tablet Take 50 mg by mouth 2 (two) times daily.        . Multiple Vitamin (MULTIVITAMIN WITH MINERALS) TABS Take 1 tablet by mouth daily.      . saxagliptin HCl (ONGLYZA) 5 MG TABS tablet Take 5 mg by mouth daily.        Marland Kitchen warfarin (COUMADIN) 5 MG tablet Take 1 tablet (5 mg total) by mouth daily. Or as directed  45 tablet  3   No current facility-administered medications for this visit.   Facility-Administered Medications Ordered in Other Visits  Medication Dose Route Frequency Provider Last Rate Last Dose  . neomycin-polymyxin-dexameth (MAXITROL) 0.1 % ophth ointment    PRN Tonny Branch, MD   1 application at [id number]    BP 152/81  Pulse 73  Ht 5\' 3"  (1.6 m)  Wt 200 lb (90.719 kg)  BMI 35.43 kg/m2 General: NAD, obese Neck: JVP not elevated, no thyromegaly or thyroid nodule.  Lungs: Clear to auscultation bilaterally with normal respiratory effort. CV: Nondisplaced PMI.  Heart regular S1/S2, no S3/S4, no murmur.  1+ edema 1/3 up lower legs bilaterally.  No carotid bruit.  Normal pedal pulses.  Abdomen: Soft, nontender, no hepatosplenomegaly, no distention.  Neurologic: Alert and oriented x 3.  Psych: Normal affect. Extremities: No clubbing or cyanosis.   Assessment/Plan: 1. Atrial fibrillation: Patient remains in NSR.  No symptoms referable to atrial fibrillation since hospital discharge.   - Given CKD, would continue on coumadin rather than NOAC at this time.  Wants to have INR checks at PCP's office as less expensive, that would be fine.  - Continue metoprolol and diltiazem CD. 2. Diastolic CHF: She  does not appear volume overloaded on exam.  She was not sent home from the hospital on Lasix.  Hopefully, if she stays out of atrial fibrillation and off Actos, she can keep her volume stable without diuretic.   3. HTN: BP elevated today but has been lower when she has checked at home.  I will have her check her BP every other day at home and record.  We will call her in 2 wks to see what her numbers are running.  4. Diabetes: Would stay off Actos given its propensity for volume retention (had acute on chronic diastolic CHF when in the hospital in 12/13).   Loralie Champagne 04/12/2012 11:23 AM

## 2012-04-12 NOTE — Patient Instructions (Signed)
Continue all current medications. Please call office in 2 weeks with BP readings Follow up in  2 months

## 2012-04-14 ENCOUNTER — Ambulatory Visit (INDEPENDENT_AMBULATORY_CARE_PROVIDER_SITE_OTHER): Payer: Medicare Other | Admitting: *Deleted

## 2012-04-14 DIAGNOSIS — I4891 Unspecified atrial fibrillation: Secondary | ICD-10-CM

## 2012-04-14 DIAGNOSIS — Z7901 Long term (current) use of anticoagulants: Secondary | ICD-10-CM

## 2012-05-30 ENCOUNTER — Other Ambulatory Visit: Payer: Self-pay | Admitting: *Deleted

## 2012-05-30 MED ORDER — DILTIAZEM HCL ER COATED BEADS 180 MG PO CP24: 180.0000 mg | ORAL_CAPSULE | Freq: Every day | ORAL | Status: DC

## 2012-05-30 MED ORDER — ATORVASTATIN CALCIUM 20 MG PO TABS: 20.0000 mg | ORAL_TABLET | Freq: Every day | ORAL | Status: DC

## 2012-06-07 ENCOUNTER — Ambulatory Visit: Payer: Medicare Other | Admitting: Cardiology

## 2012-06-26 ENCOUNTER — Telehealth: Payer: Self-pay | Admitting: Cardiology

## 2012-06-26 NOTE — Telephone Encounter (Signed)
Verified information and spoke with [facility] Heart Failure Program Case Manager, Harl Bowie, RN. She will be faxing [place] a request for recent office notes.

## 2012-06-26 NOTE — Telephone Encounter (Signed)
New problem   Miranda Garcia/[facility] Failure [facility] called and want to confirm if pt has Heart Failure. Please call in concerning this matter.

## 2012-06-27 NOTE — Telephone Encounter (Signed)
This is a Nurse, mental health office patient.

## 2012-07-26 ENCOUNTER — Ambulatory Visit (INDEPENDENT_AMBULATORY_CARE_PROVIDER_SITE_OTHER): Payer: Medicare Other | Admitting: Cardiology

## 2012-07-26 ENCOUNTER — Encounter: Payer: Self-pay | Admitting: Cardiology

## 2012-07-26 VITALS — BP 138/71 | HR 67 | Ht 63.0 in | Wt 194.0 lb

## 2012-07-26 DIAGNOSIS — I4891 Unspecified atrial fibrillation: Secondary | ICD-10-CM

## 2012-07-26 DIAGNOSIS — I509 Heart failure, unspecified: Secondary | ICD-10-CM

## 2012-07-26 DIAGNOSIS — I5032 Chronic diastolic (congestive) heart failure: Secondary | ICD-10-CM

## 2012-07-26 DIAGNOSIS — N183 Chronic kidney disease, stage 3 unspecified: Secondary | ICD-10-CM

## 2012-07-26 NOTE — Patient Instructions (Signed)
Continue all current medications. Your physician wants you to follow up in: 6 months.  You will receive a reminder letter in the mail one-two months in advance.  If you don't receive a letter, please call our office to schedule the follow up appointment   

## 2012-07-28 ENCOUNTER — Other Ambulatory Visit: Payer: Self-pay | Admitting: *Deleted

## 2012-07-28 MED ORDER — ATORVASTATIN CALCIUM 20 MG PO TABS: 20.0000 mg | ORAL_TABLET | Freq: Every day | ORAL | Status: DC

## 2012-07-28 MED ORDER — DILTIAZEM HCL ER COATED BEADS 180 MG PO CP24: 180.0000 mg | ORAL_CAPSULE | Freq: Every day | ORAL | Status: DC

## 2012-07-28 NOTE — Progress Notes (Signed)
Patient ID: Miranda Garcia, female   DOB: June 05, 1930, 79 y.o.   MRN: [id number] PCP: Dr. Woody Seller  79 yo with history of HTN and diabetes was admitted in 12/13 with chest pain for several hours along with dyspnea and tachypalpitations.  She was found to be in atrial fibrillation with RVR.  Mild increase in troponin to 0.68.  She spontaneously converted to NSR.  She was noted to be volume overloaded.  She was diuresed in the hospital.  Given mild TnI increased, Lexiscan Sestamibi was done.  This showed mild inferolateral ischemia.  It was overall a low risk study and catheterization was not undertaken given CKD.  Echo showed normal EF.    Since discharge, she has had no further chest pain.  No tachypalpitations.  She is in NSR today.  She is watching her diet and weight has been going down (she has lost another 6 lbs).  She was not sent home on Lasix.  She is mildly short of breath walking up a hill or up a long flight of steps but no dyspnea walking on flat ground or doing housework.  She has been walking for exercise.  No orthopnea or PND.    ECG: NSR, LAFB  Labs (12/13): K 4.2, creatinine 1.6, LDL 74, HDL 45  PMH: 1. HTN 2. Type II diabetes 3. Paroxysmal atrial fibrillation: CHADSVASc 5. First noted in 12/13.   4. Arthritis 5. H/o back surgery 6. CKD 7. Diastolic CHF: Echo (123456) with EF 60-65%, grade II diastolic dysfunction, normal RV size and systolic function.  Volume overload in setting of afib/RVR and Actos use.  8. Nephrolithiasis. 9. CAD: Lexiscan Sestamibi 12/13 with EF 80%, mild basal to mid inferolateral ischemia.  Low risk study, managed medically given CKD.   SH: Widow, lives in [place].  Nonsmoker.    FH: Multiple family member with cirrhosis.   Current Outpatient Prescriptions  Medication Sig Dispense Refill  . calcium citrate-vitamin D (CITRACAL+D) 315-200 MG-UNIT per tablet Take 1 tablet by mouth 2 (two) times daily.        . cholecalciferol (VITAMIN D) 1000 UNITS  tablet Take 2,000 Units by mouth daily.      Marland Kitchen glimepiride (AMARYL) 4 MG tablet Take 4 mg by mouth 2 (two) times daily.        . metoprolol (LOPRESSOR) 50 MG tablet Take 50 mg by mouth 2 (two) times daily.        . Multiple Vitamin (MULTIVITAMIN WITH MINERALS) TABS Take 1 tablet by mouth daily.      . saxagliptin HCl (ONGLYZA) 5 MG TABS tablet Take 5 mg by mouth daily.        Marland Kitchen warfarin (COUMADIN) 5 MG tablet Take 1 tablet (5 mg total) by mouth daily. Or as directed  45 tablet  3  . atorvastatin (LIPITOR) 20 MG tablet Take 1 tablet (20 mg total) by mouth daily at 6 PM.  30 tablet  6  . diltiazem (CARDIZEM CD) 180 MG 24 hr capsule Take 1 capsule (180 mg total) by mouth daily.  30 capsule  6   No current facility-administered medications for this visit.   Facility-Administered Medications Ordered in Other Visits  Medication Dose Route Frequency Provider Last Rate Last Dose  . neomycin-polymyxin-dexameth (MAXITROL) 0.1 % ophth ointment    PRN Tonny Branch, MD   1 application at [id number]    BP 138/71  Pulse 67  Ht 5\' 3"  (1.6 m)  Wt 194 lb (87.998 kg)  BMI  34.37 kg/m2  SpO2 97% General: NAD, obese Neck: JVP not elevated, no thyromegaly or thyroid nodule.  Lungs: Clear to auscultation bilaterally with normal respiratory effort. CV: Nondisplaced PMI.  Heart regular S1/S2, no S3/S4, no murmur.  Trace ankle edema bilaterally.  No carotid bruit.  Normal pedal pulses.  Abdomen: Soft, nontender, no hepatosplenomegaly, no distention.  Neurologic: Alert and oriented x 3.  Psych: Normal affect. Extremities: No clubbing or cyanosis.   Assessment/Plan: 1. Atrial fibrillation: Patient remains in NSR.  No symptoms referable to atrial fibrillation since hospital discharge.   - Given CKD, would continue on coumadin rather than NOAC at this time.  - Continue metoprolol and diltiazem CD. 2. Diastolic CHF: She does not appear volume overloaded on exam.  She was not sent home from the hospital on Lasix.   Hopefully, if she stays out of atrial fibrillation, she can keep her volume stable without diuretic.   3. HTN: BP currently controlled. 4. Diabetes: Would stay off Actos given its propensity for volume retention (had acute on chronic diastolic CHF when in the hospital in 12/13).   Miranda Garcia 07/28/2012 10:57 AM

## 2012-08-10 ENCOUNTER — Telehealth: Payer: Self-pay | Admitting: Internal Medicine

## 2012-08-10 NOTE — Telephone Encounter (Signed)
Patient states that she is having swelling in her left foot.  Wants to know if she can be called in a fluid pill to [facility] in [place]. / tgs

## 2012-08-10 NOTE — Telephone Encounter (Signed)
Left message to return call 

## 2012-08-16 NOTE — Telephone Encounter (Signed)
Left message to return call 

## 2012-08-22 NOTE — Telephone Encounter (Signed)
No return call to date.

## 2013-01-26 ENCOUNTER — Ambulatory Visit: Payer: Medicare Other | Admitting: Cardiology

## 2013-02-06 ENCOUNTER — Ambulatory Visit (INDEPENDENT_AMBULATORY_CARE_PROVIDER_SITE_OTHER): Payer: Medicare Other | Admitting: Cardiology

## 2013-02-06 ENCOUNTER — Encounter: Payer: Self-pay | Admitting: Cardiology

## 2013-02-06 VITALS — BP 150/75 | HR 70 | Ht 63.0 in | Wt 193.0 lb

## 2013-02-06 DIAGNOSIS — I4891 Unspecified atrial fibrillation: Secondary | ICD-10-CM

## 2013-02-06 DIAGNOSIS — I10 Essential (primary) hypertension: Secondary | ICD-10-CM

## 2013-02-06 NOTE — Patient Instructions (Signed)
Your physician recommends that you schedule a follow-up appointment in: 1 year with Dr. Harl Bowie. You should receive a letter in the mail in 10 months. If you do not receive this letter by September 2015 call our office to schedule this appointment.   Your physician has recommended you make the following change in your medication:  Start: Aspirin 81 MG once daily.   Continue all other medications the same.

## 2013-02-06 NOTE — Progress Notes (Signed)
Clinical Summary Miranda Garcia is a 79 y.o.female last seen by Dr Aundra Dubin, this is our first visit. She was seen for the following problems.   1. Afib - denies any significant palpitations - on rate control with diltiazem and metoprolol, also on coumadin. No troubles on coumadin, INR followed by Dr Woody Seller  2. Chest pain - episode of chest pain during prior admission in 02/2012, Lexiscan showed mild inferolateral ischemia, low risk and managed medically. Did not have cath due to CKD.  - denies any significant chest pain recently. Does note some mild DOE walking up hill which is stable.  - compliant with meds: atorva, metoprolol,  - does mild aerobic excercises regularly without troubles.   3. HTN - checks at home occasionally, typically 110s/60s - compliant with meds. Not on ACE-I, I presume because of Stage IV CKD.  4. Hyperlipidemia - followed by Dr Woody Seller, compliant with statin - no recent panel in our system  Past Medical History  Diagnosis Date  . Diabetes mellitus type II, controlled   . Hypertension   . Arthritis   . Hemorrhoids   . Atrial fibrillation   . Elevated troponin      Allergies  Allergen Reactions  . Penicillins Rash     Current Outpatient Prescriptions  Medication Sig Dispense Refill  . atorvastatin (LIPITOR) 20 MG tablet Take 1 tablet (20 mg total) by mouth daily at 6 PM.  30 tablet  6  . calcium citrate-vitamin D (CITRACAL+D) 315-200 MG-UNIT per tablet Take 1 tablet by mouth 2 (two) times daily.        . cholecalciferol (VITAMIN D) 1000 UNITS tablet Take 2,000 Units by mouth daily.      Marland Kitchen diltiazem (CARDIZEM CD) 180 MG 24 hr capsule Take 1 capsule (180 mg total) by mouth daily.  30 capsule  6  . glimepiride (AMARYL) 4 MG tablet Take 4 mg by mouth 2 (two) times daily.        . metoprolol (LOPRESSOR) 50 MG tablet Take 50 mg by mouth 2 (two) times daily.        . Multiple Vitamin (MULTIVITAMIN WITH MINERALS) TABS Take 1 tablet by mouth daily.      .  saxagliptin HCl (ONGLYZA) 5 MG TABS tablet Take 5 mg by mouth daily.        Marland Kitchen warfarin (COUMADIN) 5 MG tablet Take 1 tablet (5 mg total) by mouth daily. Or as directed  45 tablet  3   No current facility-administered medications for this visit.   Facility-Administered Medications Ordered in Other Visits  Medication Dose Route Frequency Provider Last Rate Last Dose  . neomycin-polymyxin-dexameth (MAXITROL) 0.1 % ophth ointment    PRN Tonny Jess Toney, MD   1 application at [id number]     Past Surgical History  Procedure Laterality Date  . Partial hysterectomy    . Back surgery  1997    [facility]  . Cystoscopy      [name]  . Lithotripsy      [facility]  . Cataract extraction w/phaco  01/11/2011    Procedure: CATARACT EXTRACTION PHACO AND INTRAOCULAR LENS PLACEMENT (IOC);  Surgeon: Tonny Shakeitha Umbaugh;  Location: [facility];  Service: Ophthalmology;  Laterality: Right;  CDE 19.32  . Cataract extraction w/phaco  01/21/2011    Procedure: CATARACT EXTRACTION PHACO AND INTRAOCULAR LENS PLACEMENT (IOC);  Surgeon: Tonny Ryenn Howeth;  Location: [facility];  Service: Ophthalmology;  Laterality: Left;  CDE: 21.19     Allergies  Allergen  Reactions  . Penicillins Rash      No family history on file.   Social History Miranda Garcia reports that she has never smoked. She does not have any smokeless tobacco history on file. Miranda Garcia reports that she does not drink alcohol.   Review of Systems CONSTITUTIONAL: No weight loss, fever, chills, weakness or fatigue.  HEENT: Eyes: No visual loss, blurred vision, double vision or yellow sclerae.No hearing loss, sneezing, congestion, runny nose or sore throat.  SKIN: No rash or itching.  CARDIOVASCULAR: per HPI RESPIRATORY: per HPI  GASTROINTESTINAL: No anorexia, nausea, vomiting or diarrhea. No abdominal pain or blood.  GENITOURINARY: No burning on urination, no polyuria NEUROLOGICAL: No headache, dizziness, syncope, paralysis, ataxia, numbness or tingling in the  extremities. No change in bowel or bladder control.  MUSCULOSKELETAL: No muscle, back pain, joint pain or stiffness.  LYMPHATICS: No enlarged nodes. No history of splenectomy.  PSYCHIATRIC: No history of depression or anxiety.  ENDOCRINOLOGIC: No reports of sweating, cold or heat intolerance. No polyuria or polydipsia.  Marland Kitchen   Physical Examination p 70 bp 150/70 Wt 193 lbs BMI 34 Gen: resting comfortably, no acute distress HEENT: no scleral icterus, pupils equal round and reactive, no palptable cervical adenopathy,  CV: RRR, no m/r/g, no JVD, no carotid bruit Resp: Clear to auscultation bilaterally GI: abdomen is soft, non-tender, non-distended, normal bowel sounds, no hepatosplenomegaly MSK: extremities are warm, no edema.  Skin: warm, no rash Neuro:  no focal deficits Psych: appropriate affect   Diagnostic Studies 02/2012 Echo: LVEF 60-65%, mild LVH, no WMAs, grade II diastolic dysfunction, mode LAE.  02/2012 MPI:  Abnormal Lexiscan Myoview with no diagnostic electrocardiographic changes. The scintigraphic results show breast attenuation. There is also mild ischemia in the mid and basal inferolateral wall. The gated ejection fraction was 80% and the wall motion was normal.   Assessment and Plan  1. Afib - no current symptoms, continue current medications  2. Chest pain - prior abnormal MPI low risk, being medically managed. No current symptoms, continue current meds  3. HTN - elevated in clinic, home values are at goal - continue current meds  4. Hyperlipidemia - continue current statin, she will continue to follow with her PCP      Arnoldo Lenis, M.D., F.A.C.C.

## 2013-02-23 ENCOUNTER — Other Ambulatory Visit: Payer: Self-pay | Admitting: Cardiology

## 2013-02-23 MED ORDER — DILTIAZEM HCL ER COATED BEADS 180 MG PO CP24: 180.0000 mg | ORAL_CAPSULE | Freq: Every day | ORAL | Status: DC

## 2013-02-23 MED ORDER — ATORVASTATIN CALCIUM 20 MG PO TABS: 20.0000 mg | ORAL_TABLET | Freq: Every day | ORAL | Status: DC

## 2013-08-10 ENCOUNTER — Encounter (INDEPENDENT_AMBULATORY_CARE_PROVIDER_SITE_OTHER): Payer: Self-pay | Admitting: *Deleted

## 2013-08-22 ENCOUNTER — Other Ambulatory Visit (INDEPENDENT_AMBULATORY_CARE_PROVIDER_SITE_OTHER): Payer: Self-pay | Admitting: *Deleted

## 2013-08-22 ENCOUNTER — Ambulatory Visit (INDEPENDENT_AMBULATORY_CARE_PROVIDER_SITE_OTHER): Payer: Medicare HMO | Admitting: Internal Medicine

## 2013-08-22 ENCOUNTER — Encounter (INDEPENDENT_AMBULATORY_CARE_PROVIDER_SITE_OTHER): Payer: Self-pay | Admitting: Internal Medicine

## 2013-08-22 ENCOUNTER — Telehealth (INDEPENDENT_AMBULATORY_CARE_PROVIDER_SITE_OTHER): Payer: Self-pay | Admitting: *Deleted

## 2013-08-22 VITALS — BP 142/78 | HR 68 | Temp 97.8°F | Ht 63.0 in | Wt 197.7 lb

## 2013-08-22 DIAGNOSIS — R195 Other fecal abnormalities: Secondary | ICD-10-CM

## 2013-08-22 DIAGNOSIS — Z1211 Encounter for screening for malignant neoplasm of colon: Secondary | ICD-10-CM

## 2013-08-22 NOTE — Telephone Encounter (Signed)
Patient needs movi prep 

## 2013-08-22 NOTE — Patient Instructions (Signed)
Colonoscopy with Dr. [name]. The risks and benefits such as perforation, bleeding, and infection were reviewed with the patient and is agreeable. 

## 2013-08-22 NOTE — Progress Notes (Signed)
Subjective:     Patient ID: Miranda Garcia, female   DOB: 17-Sep-1930, 80 y.o.   MRN: [id number]  HPI Referred to our office by [facility] (Dr. Woody Seller) 3 positive stool cards. She had a physical  in May. Chronic coumadin therapy for atrial fib. She denies seeing any blood in her stools. Her stools are yellow to brown in color. She has not seen any melena. She has a BM daily and sometimes more than once. No change in her stools. Sometimes she has urgency. Appetite is good. No weight loss. No acid reflux. Her last colonoscopy 3 yrs ago by Dr. Laural Golden. 07/29/2010 Colonoscopy with polypectomy:  FINAL DIAGNOSES:  1. Examination performed to cecum.  2. A 7-mm sessile polyp snared from sigmoid colon.  3. Few scattered diverticula at sigmoid colon.  4. Internal/external hemorrhoids with inflammation. Biopsy: tubular adenoma. No high grade dysplasia or malignancy identified.   07/17/2013 H and H 12.0 and 35.8.   Review of Systems Past Medical History  Diagnosis Date  . Diabetes mellitus type II, controlled     x 15 yrs  . Hypertension   . Arthritis   . Hemorrhoids   . Atrial fibrillation   . Elevated troponin     Past Surgical History  Procedure Laterality Date  . Partial hysterectomy    . Back surgery  1997    [facility]  . Cystoscopy      [name]  . Lithotripsy      [facility]  . Cataract extraction w/phaco  01/11/2011    Procedure: CATARACT EXTRACTION PHACO AND INTRAOCULAR LENS PLACEMENT (IOC);  Surgeon: Tonny Branch;  Location: [facility];  Service: Ophthalmology;  Laterality: Right;  CDE 19.32  . Cataract extraction w/phaco  01/21/2011    Procedure: CATARACT EXTRACTION PHACO AND INTRAOCULAR LENS PLACEMENT (IOC);  Surgeon: Tonny Branch;  Location: [facility];  Service: Ophthalmology;  Laterality: Left;  CDE: 21.19    Allergies  Allergen Reactions  . Penicillins Rash    Current Outpatient Prescriptions on File Prior to Visit  Medication Sig Dispense Refill  . aspirin 81 MG tablet Take  81 mg by mouth daily.      Marland Kitchen atorvastatin (LIPITOR) 20 MG tablet Take 1 tablet (20 mg total) by mouth daily at 6 PM.  30 tablet  6  . calcium citrate-vitamin D (CITRACAL+D) 315-200 MG-UNIT per tablet Take 1 tablet by mouth 2 (two) times daily.        . cholecalciferol (VITAMIN D) 1000 UNITS tablet Take 1,000 Units by mouth daily.       Marland Kitchen diltiazem (CARDIZEM CD) 180 MG 24 hr capsule Take 1 capsule (180 mg total) by mouth daily.  30 capsule  6  . metoprolol (LOPRESSOR) 50 MG tablet Take 50 mg by mouth 2 (two) times daily.        . Multiple Vitamin (MULTIVITAMIN WITH MINERALS) TABS Take 1 tablet by mouth daily.      Marland Kitchen warfarin (COUMADIN) 5 MG tablet Take 1 tablet (5 mg total) by mouth daily. Or as directed  45 tablet  3   Current Facility-Administered Medications on File Prior to Visit  Medication Dose Route Frequency Provider Last Rate Last Dose  . neomycin-polymyxin-dexameth (MAXITROL) 0.1 % ophth ointment    PRN Tonny Branch, MD   1 application at [id number]        Objective:   Physical Exam  Filed Vitals:   08/22/13 1035  BP: 142/78  Pulse: 68  Temp: 97.8 F (  36.6 C)  Height: 5\' 3"  (1.6 m)  Weight: 197 lb 11.2 oz (89.676 kg)  Alert and oriented. Skin warm and dry. Oral mucosa is moist.   . Sclera anicteric, conjunctivae is pink. Thyroid not enlarged. No cervical lymphadenopathy. Lungs clear. Heart regular rate and rhythm.  Abdomen is soft. Bowel sounds are positive. No hepatomegaly. No abdominal masses felt. No tenderness.  No edema to lower extremities.        Assessment:    Positive stool cards x 3 . Colonic neoplasm needs to be ruled out.     Plan:   Colonoscopy

## 2013-08-23 DIAGNOSIS — R195 Other fecal abnormalities: Secondary | ICD-10-CM | POA: Insufficient documentation

## 2013-08-23 MED ORDER — PEG-KCL-NACL-NASULF-NA ASC-C 100 G PO SOLR: 1.0000 | Freq: Once | ORAL | Status: DC

## 2013-09-05 ENCOUNTER — Encounter (HOSPITAL_COMMUNITY): Payer: Self-pay | Admitting: Pharmacy Technician

## 2013-09-12 ENCOUNTER — Encounter (HOSPITAL_COMMUNITY): Payer: Self-pay | Admitting: *Deleted

## 2013-09-12 ENCOUNTER — Encounter (HOSPITAL_COMMUNITY)
Admission: RE | Disposition: A | Discharge status: Home / Self Care | Payer: Self-pay | Source: Ambulatory Visit | Attending: Internal Medicine

## 2013-09-12 ENCOUNTER — Ambulatory Visit (HOSPITAL_COMMUNITY)
Admission: RE | Admit: 2013-09-12 | Discharge: 2013-09-12 | Disposition: A | Discharge status: Home / Self Care | Payer: Medicare HMO | Source: Ambulatory Visit | Attending: Internal Medicine | Admitting: Internal Medicine

## 2013-09-12 DIAGNOSIS — I4891 Unspecified atrial fibrillation: Secondary | ICD-10-CM | POA: Insufficient documentation

## 2013-09-12 DIAGNOSIS — K5730 Diverticulosis of large intestine without perforation or abscess without bleeding: Secondary | ICD-10-CM | POA: Insufficient documentation

## 2013-09-12 DIAGNOSIS — K648 Other hemorrhoids: Secondary | ICD-10-CM | POA: Insufficient documentation

## 2013-09-12 DIAGNOSIS — E119 Type 2 diabetes mellitus without complications: Secondary | ICD-10-CM | POA: Insufficient documentation

## 2013-09-12 DIAGNOSIS — K644 Residual hemorrhoidal skin tags: Secondary | ICD-10-CM | POA: Insufficient documentation

## 2013-09-12 DIAGNOSIS — Z800 Family history of malignant neoplasm of digestive organs: Secondary | ICD-10-CM

## 2013-09-12 DIAGNOSIS — K5521 Angiodysplasia of colon with hemorrhage: Secondary | ICD-10-CM | POA: Insufficient documentation

## 2013-09-12 DIAGNOSIS — Z860100 Personal history of colon polyps, unspecified: Secondary | ICD-10-CM | POA: Insufficient documentation

## 2013-09-12 DIAGNOSIS — D126 Benign neoplasm of colon, unspecified: Secondary | ICD-10-CM | POA: Insufficient documentation

## 2013-09-12 DIAGNOSIS — R195 Other fecal abnormalities: Secondary | ICD-10-CM

## 2013-09-12 DIAGNOSIS — Z09 Encounter for follow-up examination after completed treatment for conditions other than malignant neoplasm: Secondary | ICD-10-CM | POA: Insufficient documentation

## 2013-09-12 DIAGNOSIS — Q2733 Arteriovenous malformation of digestive system vessel: Secondary | ICD-10-CM

## 2013-09-12 DIAGNOSIS — Z7901 Long term (current) use of anticoagulants: Secondary | ICD-10-CM | POA: Insufficient documentation

## 2013-09-12 DIAGNOSIS — I10 Essential (primary) hypertension: Secondary | ICD-10-CM | POA: Insufficient documentation

## 2013-09-12 DIAGNOSIS — Z7982 Long term (current) use of aspirin: Secondary | ICD-10-CM | POA: Insufficient documentation

## 2013-09-12 HISTORY — PX: COLONOSCOPY: SHX5424

## 2013-09-12 LAB — GLUCOSE, CAPILLARY: GLUCOSE-CAPILLARY: 189 mg/dL — AB (ref 70–99)

## 2013-09-12 SURGERY — COLONOSCOPY
Anesthesia: Moderate Sedation

## 2013-09-12 MED ORDER — SODIUM CHLORIDE 0.9 % IV SOLN
INTRAVENOUS | Status: DC
  Administered 2013-09-12: 13:00:00 via INTRAVENOUS

## 2013-09-12 MED ORDER — MIDAZOLAM HCL 5 MG/5ML IJ SOLN
INTRAMUSCULAR | Status: DC | PRN
  Administered 2013-09-12: 2 mg via INTRAVENOUS
  Administered 2013-09-12 (×2): 1 mg via INTRAVENOUS
  Administered 2013-09-12: 2 mg via INTRAVENOUS

## 2013-09-12 MED ORDER — MEPERIDINE HCL 50 MG/ML IJ SOLN
INTRAMUSCULAR | Status: DC | PRN
  Administered 2013-09-12 (×2): 25 mg via INTRAVENOUS

## 2013-09-12 MED ORDER — MEPERIDINE HCL 50 MG/ML IJ SOLN
INTRAMUSCULAR | Status: AC
  Filled 2013-09-12: qty 1

## 2013-09-12 MED ORDER — MIDAZOLAM HCL 5 MG/5ML IJ SOLN
INTRAMUSCULAR | Status: AC
  Filled 2013-09-12: qty 10

## 2013-09-12 NOTE — H&P (Signed)
Miranda Garcia is an 80 y.o. female.   Chief Complaint: Patient is here for colonoscopy. HPI: Patient is an 79 year old Caucasian female with history of colonic adenomas who was found to have 3 out of 3 Hemoccults on routine testing. Patient denies abdominal pain change in her bowel habits rectal bleeding or melena. She is on low-dose aspirin and warfarin. Nations hemoglobin was 12 on 07/17/2013. Family history is significant for CRC and brother who was diagnosed at age 72 and died of unrelated causes.  Past Medical History  Diagnosis Date  . Diabetes mellitus type II, controlled     x 15 yrs  . Hypertension   . Arthritis   . Hemorrhoids   . Atrial fibrillation   . Elevated troponin     Past Surgical History  Procedure Laterality Date  . Partial hysterectomy    . Back surgery  1997    [facility]  . Cystoscopy      [name]  . Lithotripsy      [facility]  . Cataract extraction w/phaco  01/11/2011    Procedure: CATARACT EXTRACTION PHACO AND INTRAOCULAR LENS PLACEMENT (IOC);  Surgeon: Tonny Branch;  Location: [facility];  Service: Ophthalmology;  Laterality: Right;  CDE 19.32  . Cataract extraction w/phaco  01/21/2011    Procedure: CATARACT EXTRACTION PHACO AND INTRAOCULAR LENS PLACEMENT (IOC);  Surgeon: Tonny Branch;  Location: [facility];  Service: Ophthalmology;  Laterality: Left;  CDE: 21.19    Family History  Problem Relation Age of Onset  . Colon cancer Brother    Social History:  reports that she has never smoked. She has never used smokeless tobacco. She reports that she does not drink alcohol or use illicit drugs.  Allergies:  Allergies  Allergen Reactions  . Penicillins Rash    Medications Prior to Admission  Medication Sig Dispense Refill  . aspirin 81 MG tablet Take 81 mg by mouth daily.      Marland Kitchen atorvastatin (LIPITOR) 20 MG tablet Take 1 tablet (20 mg total) by mouth daily at 6 PM.  30 tablet  6  . calcium citrate-vitamin D (CITRACAL+D) 315-200 MG-UNIT per tablet Take 1 tablet by  mouth 2 (two) times daily.        . cholecalciferol (VITAMIN D) 1000 UNITS tablet Take 1,000 Units by mouth daily.       Marland Kitchen diltiazem (CARDIZEM CD) 180 MG 24 hr capsule Take 1 capsule (180 mg total) by mouth daily.  30 capsule  6  . furosemide (LASIX) 20 MG tablet Take 20 mg by mouth.      . Insulin Aspart Prot & Aspart (NOVOLOG MIX 70/30 [place]) Inject into the skin. 30 in morning and 15 at night.      . metoprolol (LOPRESSOR) 50 MG tablet Take 50 mg by mouth 2 (two) times daily.        . Multiple Vitamin (MULTIVITAMIN WITH MINERALS) TABS Take 1 tablet by mouth daily.      Marland Kitchen warfarin (COUMADIN) 5 MG tablet Take 1 tablet (5 mg total) by mouth daily. Or as directed  45 tablet  3    Results for orders placed during the hospital encounter of 09/12/13 (from the past 48 hour(s))  GLUCOSE, CAPILLARY     Status: Abnormal   Collection Time    09/12/13 12:27 PM      Result Value Ref Range   Glucose-Capillary 189 (*) 70 - 99 mg/dL   No results found.  ROS  Blood pressure 152/67, pulse 76, temperature  98.3 F (36.8 C), temperature source Oral, resp. rate 18, height 5\' 3"  (1.6 m), weight 197 lb (89.359 kg), SpO2 98.00%. Physical Exam  Constitutional: She appears well-developed and well-nourished.  HENT:  Mouth/Throat: Oropharynx is clear and moist.  Eyes: Conjunctivae are normal. No scleral icterus.  Neck: No thyromegaly present.  Cardiovascular: Normal rate, regular rhythm and normal heart sounds.   No murmur heard. Respiratory: Effort normal and breath sounds normal.  GI: Soft. She exhibits no distension and no mass. There is no tenderness.  Musculoskeletal: She exhibits no edema.  Lymphadenopathy:    She has no cervical adenopathy.  Neurological: She is alert.  Skin: Skin is warm and dry.     Assessment/Plan Heme positive stools. History of colonic adenomas and family history of colon carcinoma in brother at age 26. Last colonoscopy was in May 2012. Diagnostic  colonoscopy.  Garcia,Miranda U 09/12/2013, 1:25 PM

## 2013-09-12 NOTE — Discharge Instructions (Signed)
Resume aspirin on 09/15/2013 and warfarin starting tomorrow evening. Resume other medications as before. High fiber diet. No driving for 24 hours. Physician was called with biopsy results. Please get INR checked after you have been on warfarin for 7-10 days.  Colonoscopy, Care After Refer to this sheet in the next few weeks. These instructions provide you with information on caring for yourself after your procedure. Your health care provider may also give you more specific instructions. Your treatment has been planned according to current medical practices, but problems sometimes occur. Call your health care provider if you have any problems or questions after your procedure. WHAT TO EXPECT AFTER THE PROCEDURE  After your procedure, it is typical to have the following:  A small amount of blood in your stool.  Moderate amounts of gas and mild abdominal cramping or bloating. HOME CARE INSTRUCTIONS  Do not drive, operate machinery, or sign important documents for 24 hours.  You may shower and resume your regular physical activities, but move at a slower pace for the first 24 hours.  Take frequent rest periods for the first 24 hours.  Walk around or put a warm pack on your abdomen to help reduce abdominal cramping and bloating.  Drink enough fluids to keep your urine clear or pale yellow.  You may resume your normal diet as instructed by your health care provider. Avoid heavy or fried foods that are hard to digest.  Avoid drinking alcohol for 24 hours or as instructed by your health care provider.  Only take over-the-counter or prescription medicines as directed by your health care provider.  If a tissue sample (biopsy) was taken during your procedure:  Do not take aspirin or blood thinners for 7 days, or as instructed by your health care provider.  Do not drink alcohol for 7 days, or as instructed by your health care provider.  Eat soft foods for the first 24 hours. SEEK MEDICAL  CARE IF: You have persistent spotting of blood in your stool 2-3 days after the procedure. SEEK IMMEDIATE MEDICAL CARE IF:  You have more than a small spotting of blood in your stool.  You pass large blood clots in your stool.  Your abdomen is swollen (distended).  You have nausea or vomiting.  You have a fever.  You have increasing abdominal pain that is not relieved with medicine.   High-Fiber Diet Fiber is found in fruits, vegetables, and grains. A high-fiber diet encourages the addition of more whole grains, legumes, fruits, and vegetables in your diet. The recommended amount of fiber for adult males is 38 g per day. For adult females, it is 25 g per day. Pregnant and lactating women should get 28 g of fiber per day. If you have a digestive or bowel problem, ask your caregiver for advice before adding high-fiber foods to your diet. Eat a variety of high-fiber foods instead of only a select few type of foods.  PURPOSE To increase stool bulk. To make bowel movements more regular to prevent constipation. To lower cholesterol. To prevent overeating. WHEN IS THIS DIET USED? It may be used if you have constipation and hemorrhoids. It may be used if you have uncomplicated diverticulosis (intestine condition) and irritable bowel syndrome. It may be used if you need help with weight management. It may be used if you want to add it to your diet as a protective measure against atherosclerosis, diabetes, and cancer. SOURCES OF FIBER Whole-grain breads and cereals. Fruits, such as apples, oranges, bananas, berries,  prunes, and pears. Vegetables, such as green peas, carrots, sweet potatoes, beets, broccoli, cabbage, spinach, and artichokes. Legumes, such split peas, soy, lentils. Almonds. FIBER CONTENT IN FOODS Starches and Grains / Dietary Fiber (g) Cheerios, 1 cup / 3 g Corn Flakes cereal, 1 cup / 0.7 g Rice crispy treat cereal, 1 cup / 0.3 g Instant oatmeal (cooked),  cup / 2  g Frosted wheat cereal, 1 cup / 5.1 g Brown, long-grain rice (cooked), 1 cup / 3.5 g White, long-grain rice (cooked), 1 cup / 0.6 g Enriched macaroni (cooked), 1 cup / 2.5 g Legumes / Dietary Fiber (g) Baked beans (canned, plain, or vegetarian),  cup / 5.2 g Kidney beans (canned),  cup / 6.8 g Pinto beans (cooked),  cup / 5.5 g Breads and Crackers / Dietary Fiber (g) Plain or honey graham crackers, 2 squares / 0.7 g Saltine crackers, 3 squares / 0.3 g Plain, salted pretzels, 10 pieces / 1.8 g Whole-wheat bread, 1 slice / 1.9 g White bread, 1 slice / 0.7 g Raisin bread, 1 slice / 1.2 g Plain bagel, 3 oz / 2 g Flour tortilla, 1 oz / 0.9 g Corn tortilla, 1 small / 1.5 g Hamburger or hotdog bun, 1 small / 0.9 g Fruits / Dietary Fiber (g) Apple with skin, 1 medium / 4.4 g Sweetened applesauce,  cup / 1.5 g Banana,  medium / 1.5 g Grapes, 10 grapes / 0.4 g Orange, 1 small / 2.3 g Raisin, 1.5 oz / 1.6 g Melon, 1 cup / 1.4 g Vegetables / Dietary Fiber (g) Green beans (canned),  cup / 1.3 g Carrots (cooked),  cup / 2.3 g Broccoli (cooked),  cup / 2.8 g Peas (cooked),  cup / 4.4 g Mashed potatoes,  cup / 1.6 g Lettuce, 1 cup / 0.5 g Corn (canned),  cup / 1.6 g Tomato,  cup / 1.1 g

## 2013-09-12 NOTE — Op Note (Signed)
COLONOSCOPY PROCEDURE REPORT  PATIENT:  Miranda Garcia  MR#:  [id number] Birthdate:  Jul 05, 1930, 80 y.o., female Endoscopist:  Dr. Rogene Houston, MD Referred By:  Dr. Glenda Chroman, MD Procedure Date: 09/12/2013  Procedure:   Colonoscopy  Indications:  Patient is a-year-old Caucasian female with history of colonic adenomas and family history of colon carcinoma in one sibling noted to have heme positive stools. She is on warfarin and aspirin held for this procedure. She has no GI symptoms.  Informed Consent:  The procedure and risks were reviewed with the patient and informed consent was obtained.  Medications:  Demerol 50 mg IV Versed 6 mg IV  Description of procedure:  After a digital rectal exam was performed, that colonoscope was advanced from the anus through the rectum and colon to the area of the cecum, ileocecal valve and appendiceal orifice. The cecum was deeply intubated. These structures were well-seen and photographed for the record. From the level of the cecum and ileocecal valve, the scope was slowly and cautiously withdrawn. The mucosal surfaces were carefully surveyed utilizing scope tip to flexion to facilitate fold flattening as needed. The scope was pulled down into the rectum where a thorough exam including retroflexion was performed. Immanent ileum was also examined.  Findings:   Prep excellent. Normal terminal ileum. Small cecal polyp located close to appendiceal orifice; it was ablated via cold biopsy. Streaks of fresh blood at cecum traced to four small AV malformations. All of these lesions were ablated with argon plasma coagulator. Scattered diverticula at descending and sigmoid colon. Normal rectal mucosa. Hemorrhoids above and below the dentate line. Patient also has soft anal skin tags.   Therapeutic/Diagnostic Maneuvers Performed:  See above  Complications:  None  Cecal Withdrawal Time:  14 minutes  Impression:  Normal mucosa of terminal  ileum. Four small cecal AVMs with stigmata of bleed(oozing). These lesions were ablated with argon plasma coagulator. 3 mm cecal polyp ablated via cold biopsy. Left-sided colonic diverticulosis. Prominent external hemorrhoids with small internal hemorrhoids.    Recommendations:  Standard instructions given. Patient will resume warfarin starting tomorrow evening and get INR checked in 10 days. Resume aspirin on 09/15/2013. I will contact patient with biopsy results and further recommendations.  Adia Crammer U  09/12/2013 2:15 PM  CC: Dr. Glenda Chroman., MD & Dr. Rayne Du ref. provider found

## 2013-09-13 ENCOUNTER — Encounter (INDEPENDENT_AMBULATORY_CARE_PROVIDER_SITE_OTHER): Payer: Self-pay

## 2013-09-14 ENCOUNTER — Encounter (HOSPITAL_COMMUNITY): Payer: Self-pay | Admitting: Internal Medicine

## 2013-09-21 ENCOUNTER — Other Ambulatory Visit: Payer: Self-pay | Admitting: Cardiology

## 2013-10-01 ENCOUNTER — Encounter (INDEPENDENT_AMBULATORY_CARE_PROVIDER_SITE_OTHER): Payer: Self-pay | Admitting: *Deleted

## 2014-01-15 ENCOUNTER — Other Ambulatory Visit: Payer: Self-pay | Admitting: Cardiology

## 2014-02-07 ENCOUNTER — Ambulatory Visit (INDEPENDENT_AMBULATORY_CARE_PROVIDER_SITE_OTHER): Payer: Medicare HMO | Admitting: Cardiology

## 2014-02-07 ENCOUNTER — Encounter: Payer: Self-pay | Admitting: Cardiology

## 2014-02-07 VITALS — BP 123/72 | HR 64 | Ht 63.0 in | Wt 206.0 lb

## 2014-02-07 DIAGNOSIS — I4891 Unspecified atrial fibrillation: Secondary | ICD-10-CM

## 2014-02-07 DIAGNOSIS — E785 Hyperlipidemia, unspecified: Secondary | ICD-10-CM

## 2014-02-07 DIAGNOSIS — I5032 Chronic diastolic (congestive) heart failure: Secondary | ICD-10-CM

## 2014-02-07 DIAGNOSIS — I10 Essential (primary) hypertension: Secondary | ICD-10-CM

## 2014-02-07 MED ORDER — FUROSEMIDE 20 MG PO TABS: 20.0000 mg | ORAL_TABLET | Freq: Every day | ORAL | Status: DC | PRN

## 2014-02-07 NOTE — Patient Instructions (Signed)
   Continue Lasix 20mg  daily as needed swelling - refill sent to pharm Continue all other medications.   Your physician wants you to follow up in: 6 months.  You will receive a reminder letter in the mail one-two months in advance.  If you don't receive a letter, please call our office to schedule the follow up appointment

## 2014-02-07 NOTE — Progress Notes (Signed)
Clinical Summary Miranda Garcia is a 80 y.o.female seen today for follow up of the following medical problems.   1. Afib - denies any significant palpitations - on rate control with diltiazem and metoprolol, also on coumadin. No troubles on coumadin, INR followed by Dr Woody Seller   2. HTN - checks at home occasionally, typically 120s/70s - compliant with meds. Not on ACE-I, I presume because of Stage IV CKD.  3. Hyperlipidemia - followed by Dr Woody Seller, compliant with statin - last lipid panel 06/2013 TC 106 TG 110 HDL 48 LDL 36  4. Chronic diastolic HF - has had some increased SOB over the last few months. DOE with walking up a flight of stairs.Has noted some increased LE edema. Takes lasix only prn.  Past Medical History  Diagnosis Date  . Diabetes mellitus type II, controlled     x 15 yrs  . Hypertension   . Arthritis   . Hemorrhoids   . Atrial fibrillation   . Elevated troponin      Allergies  Allergen Reactions  . Penicillins Rash     Current Outpatient Prescriptions  Medication Sig Dispense Refill  . atorvastatin (LIPITOR) 20 MG tablet TAKE 1 TABLET DAILY AT 6PM 30 tablet 4  . calcium citrate-vitamin D (CITRACAL+D) 315-200 MG-UNIT per tablet Take 1 tablet by mouth 2 (two) times daily.      . cholecalciferol (VITAMIN D) 1000 UNITS tablet Take 1,000 Units by mouth daily.     Marland Kitchen diltiazem (CARDIZEM CD) 180 MG 24 hr capsule TAKE ONE (1) CAPSULE EACH DAY 30 capsule 6  . furosemide (LASIX) 20 MG tablet Take 20 mg by mouth.    . Insulin Aspart Prot & Aspart (NOVOLOG MIX 70/30 [place]) Inject into the skin. 30 in morning and 15 at night.    . metoprolol (LOPRESSOR) 50 MG tablet Take 50 mg by mouth 2 (two) times daily.      . Multiple Vitamin (MULTIVITAMIN WITH MINERALS) TABS Take 1 tablet by mouth daily.    Marland Kitchen warfarin (COUMADIN) 5 MG tablet Take 1 tablet (5 mg total) by mouth daily. Or as directed 45 tablet 3   No current facility-administered medications for this visit.    Facility-Administered Medications Ordered in Other Visits  Medication Dose Route Frequency Provider Last Rate Last Dose  . neomycin-polymyxin-dexameth (MAXITROL) 0.1 % ophth ointment    PRN Tonny Yehudit Fulginiti, MD   1 application at [id number]     Past Surgical History  Procedure Laterality Date  . Partial hysterectomy    . Back surgery  1997    [facility]  . Cystoscopy      [name]  . Lithotripsy      [facility]  . Cataract extraction w/phaco  01/11/2011    Procedure: CATARACT EXTRACTION PHACO AND INTRAOCULAR LENS PLACEMENT (IOC);  Surgeon: Tonny Hussein Macdougal;  Location: [facility];  Service: Ophthalmology;  Laterality: Right;  CDE 19.32  . Cataract extraction w/phaco  01/21/2011    Procedure: CATARACT EXTRACTION PHACO AND INTRAOCULAR LENS PLACEMENT (IOC);  Surgeon: Tonny Antione Obar;  Location: [facility];  Service: Ophthalmology;  Laterality: Left;  CDE: 21.19  . Colonoscopy N/A 09/12/2013    Procedure: COLONOSCOPY;  Surgeon: Rogene Houston, MD;  Location: [facility];  Service: Endoscopy;  Laterality: N/A;  125     Allergies  Allergen Reactions  . Penicillins Rash      Family History  Problem Relation Age of Onset  . Colon cancer Brother  Social History [name] reports that she has never smoked. She has never used smokeless tobacco. [name] reports that she does not drink alcohol.   Review of Systems CONSTITUTIONAL: No weight loss, fever, chills, weakness or fatigue.  HEENT: Eyes: No visual loss, blurred vision, double vision or yellow sclerae.No hearing loss, sneezing, congestion, runny nose or sore throat.  SKIN: No rash or itching.  CARDIOVASCULAR: per HPI RESPIRATORY: No cough or sputum.  GASTROINTESTINAL: No anorexia, nausea, vomiting or diarrhea. No abdominal pain or blood.  GENITOURINARY: No burning on urination, no polyuria NEUROLOGICAL: No headache, dizziness, syncope, paralysis, ataxia, numbness or tingling in the extremities. No change in bowel or bladder control.   MUSCULOSKELETAL: No muscle, back pain, joint pain or stiffness.  LYMPHATICS: No enlarged nodes. No history of splenectomy.  PSYCHIATRIC: No history of depression or anxiety.  ENDOCRINOLOGIC: No reports of sweating, cold or heat intolerance. No polyuria or polydipsia.  Marland Kitchen   Physical Examination p 64 bp 123/72 Wt 206 lbs BMI 37 Gen: resting comfortably, no acute distress HEENT: no scleral icterus, pupils equal round and reactive, no palptable cervical adenopathy,  CV: RRR, 2/6 systolic murmur RUSB, no JVD, no carotid bruits Resp: Clear to auscultation bilaterally GI: abdomen is soft, non-tender, non-distended, normal bowel sounds, no hepatosplenomegaly MSK: extremities are warm, 1+ bilateral edema Skin: warm, no rash Neuro:  no focal deficits Psych: appropriate affect   Diagnostic Studies 02/2012 Echo: LVEF 60-65%, mild LVH, no WMAs, grade II diastolic dysfunction, mode LAE.  02/2012 MPI:  Abnormal Lexiscan Myoview with no diagnostic electrocardiographic changes. The scintigraphic results show breast attenuation. There is also mild ischemia in the mid and basal inferolateral wall. The gated ejection fraction was 80% and the wall motion was normal.    Assessment and Plan  1. Afib - no current symptoms, continue current medications  2. HTN - at goal, continue current meds  3. Hyperlipidemia - at goal, continue current statin  4. Chronic diastolic HF - reports some increased symptoms, encouraged to take her lasix more regularly, currently only taking every few days.     Arnoldo Lenis, M.D.

## 2014-02-22 ENCOUNTER — Other Ambulatory Visit: Payer: Self-pay | Admitting: Cardiology

## 2014-07-22 ENCOUNTER — Encounter: Payer: Self-pay | Admitting: *Deleted

## 2014-07-22 ENCOUNTER — Encounter: Payer: Self-pay | Admitting: Cardiology

## 2014-07-22 ENCOUNTER — Ambulatory Visit (INDEPENDENT_AMBULATORY_CARE_PROVIDER_SITE_OTHER): Payer: Medicare HMO | Admitting: Cardiology

## 2014-07-22 VITALS — BP 138/82 | HR 61 | Ht 63.0 in | Wt 203.0 lb

## 2014-07-22 DIAGNOSIS — I5032 Chronic diastolic (congestive) heart failure: Secondary | ICD-10-CM | POA: Diagnosis not present

## 2014-07-22 DIAGNOSIS — E785 Hyperlipidemia, unspecified: Secondary | ICD-10-CM | POA: Diagnosis not present

## 2014-07-22 DIAGNOSIS — I4891 Unspecified atrial fibrillation: Secondary | ICD-10-CM | POA: Diagnosis not present

## 2014-07-22 DIAGNOSIS — I10 Essential (primary) hypertension: Secondary | ICD-10-CM | POA: Diagnosis not present

## 2014-07-22 NOTE — Patient Instructions (Signed)
Your physician wants you to follow-up in: 6 months with Dr. Bryna Colander will receive a reminder letter in the mail two months in advance. If you don't receive a letter, please call our office to schedule the follow-up appointment.  Your physician recommends that you continue on your current medications as directed. Please refer to the Current Medication list given to you today.  WE WILL REQUEST LABS FROM DR. VYAS  Thank you for choosing [facility]!!

## 2014-07-22 NOTE — Progress Notes (Signed)
Clinical Summary Miranda Garcia is a 81 y.o.female seen today for follow up of the following medical problems.  1. Afib -one episode of palpitations since our last visit that occurred  this morning. BP 120/56 and heart rate 92. Lasted approximately 1 hour. Had not taken meds at that time.  - denies any bleeding issues on coumadin, compliant. INR followed by Dr Woody Seller. Was told by another provider she was not a candidate for NOACs due to renal function.   2. HTN - checks at home occasionally, typically 130s/70s - compliant with meds. Not on ACE-I, I presume because of Stage IV CKD.  3. Hyperlipidemia - followed by Dr Woody Seller, compliant with statin - last lipid panel 06/2013 TC 106 TG 110 HDL 48 LDL 36  4. Chronic diastolic HF - denies SOB, no DOE or LE edema   Past Medical History  Diagnosis Date  . Diabetes mellitus type II, controlled     x 15 yrs  . Hypertension   . Arthritis   . Hemorrhoids   . Atrial fibrillation   . Elevated troponin      Allergies  Allergen Reactions  . Penicillins Rash     Current Outpatient Prescriptions  Medication Sig Dispense Refill  . atorvastatin (LIPITOR) 20 MG tablet TAKE 1 TABLET DAILY AT 6PM 30 tablet 5  . calcium citrate-vitamin D (CITRACAL+D) 315-200 MG-UNIT per tablet Take 1 tablet by mouth 2 (two) times daily.      . cholecalciferol (VITAMIN D) 1000 UNITS tablet Take 1,000 Units by mouth daily.     Marland Kitchen diltiazem (CARDIZEM CD) 180 MG 24 hr capsule TAKE ONE (1) CAPSULE EACH DAY 30 capsule 6  . furosemide (LASIX) 20 MG tablet Take 1 tablet (20 mg total) by mouth daily as needed for edema (swelling). 30 tablet 6  . Insulin Aspart Prot & Aspart (NOVOLOG MIX 70/30 [place]) Inject into the skin. 30 in morning and 15 at night.    . metoprolol (LOPRESSOR) 50 MG tablet Take 50 mg by mouth 2 (two) times daily.      . Multiple Vitamin (MULTIVITAMIN WITH MINERALS) TABS Take 1 tablet by mouth daily.    Marland Kitchen warfarin (COUMADIN) 5 MG tablet Take 1 tablet  (5 mg total) by mouth daily. Or as directed 45 tablet 3   No current facility-administered medications for this visit.   Facility-Administered Medications Ordered in Other Visits  Medication Dose Route Frequency Provider Last Rate Last Dose  . neomycin-polymyxin-dexameth (MAXITROL) 0.1 % ophth ointment    PRN Tonny Branch, MD   1 application at [id number]     Past Surgical History  Procedure Laterality Date  . Partial hysterectomy    . Back surgery  1997    [facility]  . Cystoscopy      [name]  . Lithotripsy      [facility]  . Cataract extraction w/phaco  01/11/2011    Procedure: CATARACT EXTRACTION PHACO AND INTRAOCULAR LENS PLACEMENT (IOC);  Surgeon: Tonny Branch;  Location: [facility];  Service: Ophthalmology;  Laterality: Right;  CDE 19.32  . Cataract extraction w/phaco  01/21/2011    Procedure: CATARACT EXTRACTION PHACO AND INTRAOCULAR LENS PLACEMENT (IOC);  Surgeon: Tonny Branch;  Location: [facility];  Service: Ophthalmology;  Laterality: Left;  CDE: 21.19  . Colonoscopy N/A 09/12/2013    Procedure: COLONOSCOPY;  Surgeon: Rogene Houston, MD;  Location: [facility];  Service: Endoscopy;  Laterality: N/A;  125     Allergies  Allergen Reactions  .  Penicillins Rash      Family History  Problem Relation Age of Onset  . Colon cancer Brother      Social History Ms. Baz reports that she has never smoked. She has never used smokeless tobacco. Ms. Lizalde reports that she does not drink alcohol.   Review of Systems CONSTITUTIONAL: No weight loss, fever, chills, weakness or fatigue.  HEENT: Eyes: No visual loss, blurred vision, double vision or yellow sclerae.No hearing loss, sneezing, congestion, runny nose or sore throat.  SKIN: No rash or itching.  CARDIOVASCULAR: per HPI RESPIRATORY: No shortness of breath, cough or sputum.  GASTROINTESTINAL: No anorexia, nausea, vomiting or diarrhea. No abdominal pain or blood.  GENITOURINARY: No burning on urination, no  polyuria NEUROLOGICAL: No headache, dizziness, syncope, paralysis, ataxia, numbness or tingling in the extremities. No change in bowel or bladder control.  MUSCULOSKELETAL: No muscle, back pain, joint pain or stiffness.  LYMPHATICS: No enlarged nodes. No history of splenectomy.  PSYCHIATRIC: No history of depression or anxiety.  ENDOCRINOLOGIC: No reports of sweating, cold or heat intolerance. No polyuria or polydipsia.  Marland Kitchen   Physical Examination p 61 bp 138/82 Wt 203 lbs BMI 36  Gen: resting comfortably, no acute distress HEENT: no scleral icterus, pupils equal round and reactive, no palptable cervical adenopathy,  CV: RRR, no m/r/g, no JVD, no carotid bruits Resp: Clear to auscultation bilaterally GI: abdomen is soft, non-tender, non-distended, normal bowel sounds, no hepatosplenomegaly MSK: extremities are warm, no edema.  Skin: warm, no rash Neuro:  no focal deficits Psych: appropriate affect   Diagnostic Studies  02/2012 Echo: LVEF 60-65%, mild LVH, no WMAs, grade II diastolic dysfunction, mode LAE.  02/2012 MPI:  Abnormal Lexiscan Myoview with no diagnostic electrocardiographic changes. The scintigraphic results show breast attenuation. There is also mild ischemia in the mid and basal inferolateral wall. The gated ejection fraction was 80% and the wall motion was normal.   Assessment and Plan   1. Afib - one episode of palpitations since our last visit. Will continue to monitor, if progress will titrate up meds. Counseled to take extra 25mg  of lopressor is she is having symptoms. Continue coumadin, will request most recent labs from pcp. She may be a NOAC candidate pending renal function, she has some interest.   2. HTN - at goal, continue current meds  3. Hyperlipidemia - at goal, continue current statin  4. Chronic diastolic HF - appears euvolemic, continue current meds   F/u 6 months     Arnoldo Lenis, M.D.

## 2014-08-15 ENCOUNTER — Other Ambulatory Visit: Payer: Self-pay | Admitting: Cardiology

## 2014-08-22 ENCOUNTER — Other Ambulatory Visit: Payer: Self-pay | Admitting: Cardiology

## 2015-02-03 ENCOUNTER — Ambulatory Visit (INDEPENDENT_AMBULATORY_CARE_PROVIDER_SITE_OTHER): Payer: Medicare HMO | Admitting: Cardiology

## 2015-02-03 ENCOUNTER — Encounter: Payer: Self-pay | Admitting: Cardiology

## 2015-02-03 ENCOUNTER — Encounter: Payer: Self-pay | Admitting: *Deleted

## 2015-02-03 VITALS — BP 146/79 | HR 71 | Ht 63.0 in | Wt 210.0 lb

## 2015-02-03 DIAGNOSIS — I4891 Unspecified atrial fibrillation: Secondary | ICD-10-CM

## 2015-02-03 DIAGNOSIS — E785 Hyperlipidemia, unspecified: Secondary | ICD-10-CM

## 2015-02-03 DIAGNOSIS — I10 Essential (primary) hypertension: Secondary | ICD-10-CM

## 2015-02-03 NOTE — Progress Notes (Signed)
Patient ID: Miranda Garcia, female   DOB: [birth year], 81 y.o.   MRN: [id number]     Clinical Summary Ms. Miranda Garcia is a 81 y.o.female seen today for follow up of the following medical problems.   1. Afib - denies any palpitations. - compliant with coumadin  2. HTN - checks at home occasionally, typically 130s/70s - compliant with meds. Not on ACE-I, I presume because of Stage IV CKD.  3. Hyperlipidemia - followed by Dr Miranda Garcia, compliant with statin  4. Chronic diastolic HF - denies SOB, no DOE or LE edema  Past Medical History  Diagnosis Date  . Diabetes mellitus type II, controlled     x 15 yrs  . Hypertension   . Arthritis   . Hemorrhoids   . Atrial fibrillation   . Elevated troponin      Allergies  Allergen Reactions  . Penicillins Rash     Current Outpatient Prescriptions  Medication Sig Dispense Refill  . atorvastatin (LIPITOR) 20 MG tablet TAKE 1 TABLET DAILY AT 6PM 30 tablet 6  . calcium citrate-vitamin D (CITRACAL+D) 315-200 MG-UNIT per tablet Take 1 tablet by mouth 2 (two) times daily.      . cholecalciferol (VITAMIN D) 1000 UNITS tablet Take 5,000 Units by mouth daily.     Marland Kitchen diltiazem (CARDIZEM CD) 180 MG 24 hr capsule TAKE ONE (1) CAPSULE EACH DAY 30 capsule 6  . furosemide (LASIX) 20 MG tablet Take 1 tablet (20 mg total) by mouth daily as needed for edema (swelling). 30 tablet 6  . Insulin Aspart Prot & Aspart (NOVOLOG MIX 70/30 [place]) Inject into the skin. 30 in morning and 15 at night.    . metoprolol (LOPRESSOR) 50 MG tablet Take 50 mg by mouth 2 (two) times daily.      . Multiple Vitamin (MULTIVITAMIN WITH MINERALS) TABS Take 1 tablet by mouth daily.    Marland Kitchen warfarin (COUMADIN) 5 MG tablet Take 1 tablet (5 mg total) by mouth daily. Or as directed 45 tablet 3   No current facility-administered medications for this visit.   Facility-Administered Medications Ordered in Other Visits  Medication Dose Route Frequency Provider Last Rate Last Dose  .  neomycin-polymyxin-dexameth (MAXITROL) 0.1 % ophth ointment    PRN Miranda Rebekka Lobello, MD   1 application at [id number]     Past Surgical History  Procedure Laterality Date  . Partial hysterectomy    . Back surgery  1997    [facility]  . Cystoscopy      Miranda Garcia  . Lithotripsy      [facility]  . Cataract extraction w/phaco  01/11/2011    Procedure: CATARACT EXTRACTION PHACO AND INTRAOCULAR LENS PLACEMENT (IOC);  Surgeon: Miranda Garcia;  Location: [facility];  Service: Ophthalmology;  Laterality: Right;  CDE 19.32  . Cataract extraction w/phaco  01/21/2011    Procedure: CATARACT EXTRACTION PHACO AND INTRAOCULAR LENS PLACEMENT (IOC);  Surgeon: Miranda Garcia;  Location: [facility] OR[facility];  Service: Ophthalmology;  Laterality: Left;  CDE: 21.19  . Colonoscopy N/A 09/12/2013    Procedure: COLONOSCOPY;  Surgeon: Miranda Houston, MD;  Location: [facility];  Service: Endoscopy;  Laterality: N/A;  125     Allergies  Allergen Reactions  . Penicillins Rash      Family History  Problem Relation Age of Onset  . Colon cancer Brother      Social History Ms. Miranda Garcia reports that she has never smoked. She has never used smokeless tobacco. Ms. Miranda Garcia reports that she does  not drink alcohol.   Review of Systems CONSTITUTIONAL: No weight loss, fever, chills, weakness or fatigue.  HEENT: Eyes: No visual loss, blurred vision, double vision or yellow sclerae.No hearing loss, sneezing, congestion, runny nose or sore throat.  SKIN: No rash or itching.  CARDIOVASCULAR: per hpi RESPIRATORY: No shortness of breath, cough or sputum.  GASTROINTESTINAL: No anorexia, nausea, vomiting or diarrhea. No abdominal pain or blood.  GENITOURINARY: No burning on urination, no polyuria NEUROLOGICAL: No headache, dizziness, syncope, paralysis, ataxia, numbness or tingling in the extremities. No change in bowel or bladder control.  MUSCULOSKELETAL: No muscle, back pain, joint pain or stiffness.  LYMPHATICS: No enlarged nodes. No history of  splenectomy.  PSYCHIATRIC: No history of depression or anxiety.  ENDOCRINOLOGIC: No reports of sweating, cold or heat intolerance. No polyuria or polydipsia.  Marland Kitchen   Physical Examination Filed Vitals:   02/03/15 1111  BP: 146/79  Pulse: 71   Filed Vitals:   02/03/15 1111  Height: 5\' 3"  (1.6 m)  Weight: 210 lb (95.255 kg)    Gen: resting comfortably, no acute distress HEENT: no scleral icterus, pupils equal round and reactive, no palptable cervical adenopathy,  CV: RRR, no mrg, no jvd Resp: Clear to auscultation bilaterally GI: abdomen is soft, non-tender, non-distended, normal bowel sounds, no hepatosplenomegaly MSK: extremities are warm, no edema.  Skin: warm, no rash Neuro:  no focal deficits Psych: appropriate affect   Diagnostic Studies 02/2012 Echo: LVEF 60-65%, mild LVH, no WMAs, grade II diastolic dysfunction, mode LAE.  02/2012 MPI:  Abnormal Lexiscan Myoview with no diagnostic electrocardiographic changes. The scintigraphic results show breast attenuation. There is also mild ischemia in the mid and basal inferolateral wall. The gated ejection fraction was 80% and the wall motion was normal.    Assessment and Plan   1. Afib - no current symptoms - she is interested in changing to eliquis if the cost is ok. Will check with her pharmacy, will also need updated renal function prior to dosing   2. HTN - at goal, continue current meds  3. Hyperlipidemia - at goal, continue current statin  4. Chronic diastolic HF - appears euvolemic, continue current meds  F/u 6 months   Arnoldo Lenis, M.D.

## 2015-02-03 NOTE — Patient Instructions (Signed)

## 2015-03-14 ENCOUNTER — Other Ambulatory Visit: Payer: Self-pay | Admitting: Cardiology

## 2015-08-07 ENCOUNTER — Encounter: Payer: Self-pay | Admitting: Cardiology

## 2015-08-07 ENCOUNTER — Ambulatory Visit (INDEPENDENT_AMBULATORY_CARE_PROVIDER_SITE_OTHER): Payer: Medicare HMO | Admitting: Cardiology

## 2015-08-07 VITALS — BP 137/77 | HR 70 | Ht 63.0 in | Wt 208.0 lb

## 2015-08-07 DIAGNOSIS — E785 Hyperlipidemia, unspecified: Secondary | ICD-10-CM

## 2015-08-07 DIAGNOSIS — I5032 Chronic diastolic (congestive) heart failure: Secondary | ICD-10-CM

## 2015-08-07 DIAGNOSIS — I10 Essential (primary) hypertension: Secondary | ICD-10-CM | POA: Diagnosis not present

## 2015-08-07 DIAGNOSIS — I4891 Unspecified atrial fibrillation: Secondary | ICD-10-CM

## 2015-08-07 NOTE — Patient Instructions (Signed)
Your physician wants you to follow-up in: [place] [address] BRANCH You will receive a reminder letter in the mail two months in advance. If you don't receive a letter, please call our office to schedule the follow-up appointment.  Your physician has recommended you make the following change in your medication:   STOP ASPIRIN   Thank you for choosing [facility]!!

## 2015-08-07 NOTE — Progress Notes (Signed)
Patient ID: Miranda Garcia, female   DOB: [birth year], 82 y.o.   MRN: [id number]     Clinical Summary Miranda Garcia is a 82 y.o.female seen today for follow up of the following medical problems.  1. Afib - isolated episode of palpitations, otherwise doing well. No recent bleeding troubles on coumadin.    2. HTN - checks at home occasionally, typically 120-140s/60-70s - compliant with meds.  3. Hyperlipidemia - followed by Dr Woody Seller, compliant with statin - 07/2015: TC 114 TG 104 HDL 56 LDL 37   4. Chronic diastolic HF - no recent LE edema. No recent SOB or DOE.     SH: spends free time sewing, baking.  Past Medical History  Diagnosis Date  . Diabetes mellitus type II, controlled ([place])     x 15 yrs  . Hypertension   . Arthritis   . Hemorrhoids   . Atrial fibrillation ([place])   . Elevated troponin      Allergies  Allergen Reactions  . Penicillins Rash     Current Outpatient Prescriptions  Medication Sig Dispense Refill  . atorvastatin (LIPITOR) 20 MG tablet TAKE ONE (1) TABLET EACH DAY 6PM 30 tablet 6  . calcium citrate-vitamin D (CITRACAL+D) 315-200 MG-UNIT per tablet Take 1 tablet by mouth 2 (two) times daily.      . cholecalciferol (VITAMIN D) 1000 UNITS tablet Take 5,000 Units by mouth daily.     Marland Kitchen diltiazem (CARDIZEM CD) 180 MG 24 hr capsule TAKE ONE (1) CAPSULE EACH DAY 30 capsule 6  . furosemide (LASIX) 20 MG tablet Take 1 tablet (20 mg total) by mouth daily as needed for edema (swelling). 30 tablet 6  . Insulin Aspart Prot & Aspart (NOVOLOG MIX 70/30 [place]) Inject into the skin. 30 in morning and 15 at night.    . metoprolol (LOPRESSOR) 50 MG tablet Take 50 mg by mouth 2 (two) times daily.      . Multiple Vitamin (MULTIVITAMIN WITH MINERALS) TABS Take 1 tablet by mouth daily.    . Multiple Vitamins-Minerals (PRESERVISION AREDS) TABS Take 1 tablet by mouth 2 (two) times daily.    Marland Kitchen warfarin (COUMADIN) 5 MG tablet Take 1 tablet (5 mg total) by mouth daily. Or as  directed 45 tablet 3   No current facility-administered medications for this visit.   Facility-Administered Medications Ordered in Other Visits  Medication Dose Route Frequency Provider Last Rate Last Dose  . neomycin-polymyxin-dexameth (MAXITROL) 0.1 % ophth ointment    PRN Tonny Bodey Frizell, MD   1 application at [id number]     Past Surgical History  Procedure Laterality Date  . Partial hysterectomy    . Back surgery  1997    [facility]  . Cystoscopy      [name]  . Lithotripsy      [facility]  . Cataract extraction w/phaco  01/11/2011    Procedure: CATARACT EXTRACTION PHACO AND INTRAOCULAR LENS PLACEMENT (IOC);  Surgeon: Tonny Ocie Tino;  Location: [facility];  Service: Ophthalmology;  Laterality: Right;  CDE 19.32  . Cataract extraction w/phaco  01/21/2011    Procedure: CATARACT EXTRACTION PHACO AND INTRAOCULAR LENS PLACEMENT (IOC);  Surgeon: Tonny Daryan Cagley;  Location: [facility];  Service: Ophthalmology;  Laterality: Left;  CDE: 21.19  . Colonoscopy N/A 09/12/2013    Procedure: COLONOSCOPY;  Surgeon: Rogene Houston, MD;  Location: [facility];  Service: Endoscopy;  Laterality: N/A;  125     Allergies  Allergen Reactions  . Penicillins Rash  Family History  Problem Relation Age of Onset  . Colon cancer Brother      Social History Miranda Garcia reports that she has never smoked. She has never used smokeless tobacco. Miranda Garcia reports that she does not drink alcohol.   Review of Systems CONSTITUTIONAL: No weight loss, fever, chills, weakness or fatigue.  HEENT: Eyes: No visual loss, blurred vision, double vision or yellow sclerae.No hearing loss, sneezing, congestion, runny nose or sore throat.  SKIN: No rash or itching.  CARDIOVASCULAR: per HPI RESPIRATORY: No shortness of breath, cough or sputum.  GASTROINTESTINAL: No anorexia, nausea, vomiting or diarrhea. No abdominal pain or blood.  GENITOURINARY: No burning on urination, no polyuria NEUROLOGICAL: No headache, dizziness,  syncope, paralysis, ataxia, numbness or tingling in the extremities. No change in bowel or bladder control.  MUSCULOSKELETAL: No muscle, back pain, joint pain or stiffness.  LYMPHATICS: No enlarged nodes. No history of splenectomy.  PSYCHIATRIC: No history of depression or anxiety.  ENDOCRINOLOGIC: No reports of sweating, cold or heat intolerance. No polyuria or polydipsia.  Marland Kitchen   Physical Examination Filed Vitals:   08/07/15 1116  BP: 137/77  Pulse: 70   Filed Vitals:   08/07/15 1116  Height: 5\' 3"  (1.6 m)  Weight: 208 lb (94.348 kg)    Gen: resting comfortably, no acute distress HEENT: no scleral icterus, pupils equal round and reactive, no palptable cervical adenopathy,  CV: RRR, 2/6 systolic at apex, no jvd Resp: Clear to auscultation bilaterally GI: abdomen is soft, non-tender, non-distended, normal bowel sounds, no hepatosplenomegaly MSK: extremities are warm, no edema.  Skin: warm, no rash Neuro:  no focal deficits Psych: appropriate affect   Diagnostic Studies 02/2012 Echo: LVEF 60-65%, mild LVH, no WMAs, grade II diastolic dysfunction, mode LAE.  02/2012 MPI:  Abnormal Lexiscan Myoview with no diagnostic electrocardiographic changes. The scintigraphic results show breast attenuation. There is also mild ischemia in the mid and basal inferolateral wall. The gated ejection fraction was 80% and the wall motion was normal.    Assessment and Plan   1. Afib  - no current symptoms  - CHADS2VASC score is 4, continue coumadin. With heavy bruising we will stop ASA.   2. HTN  - at goal, we will continue her current meds   3. Hyperlipidemia  - at goal, we will continue current statin   4. Chronic diastolic HF  - appears euvolemic, no current symptoms - continue current meds   F/u 6 months   Arnoldo Lenis, M.D.

## 2015-08-20 ENCOUNTER — Telehealth: Payer: Self-pay | Admitting: Internal Medicine

## 2015-08-20 NOTE — Telephone Encounter (Signed)
I received a call from Miranda Garcia grandson who states that patient has not been feeling well since the morning. She has had elevated heart rates and blood pressure all throughout the morning. Her blood pressure is ranging from 140s to 160s and her heart rate has been in 110s. She has mild shortness of breath and generalized fatigue but otherwise feel well. She denies any chest pain, chest pressure, orthopnea. Reviewing her history, patient has history of paroxysmal atrial fibrillation. Her baseline heart rate is 70s 80s. I suspect she is likely in A. fib. Recommended extra dose of metoprolol tartrate 50 mg 1. Also stated to bring the patient over to the emergency department if she does not feel better.

## 2015-10-14 ENCOUNTER — Other Ambulatory Visit: Payer: Self-pay | Admitting: *Deleted

## 2015-10-14 MED ORDER — DILTIAZEM HCL ER COATED BEADS 180 MG PO CP24: ORAL_CAPSULE | ORAL | 6 refills | Status: DC

## 2015-12-14 ENCOUNTER — Telehealth: Payer: Self-pay | Admitting: Student

## 2015-12-14 NOTE — Telephone Encounter (Signed)
  Patient's grandson called regarding patient's HR being elevated. HR was 120 this AM. Rechecked and 114. Denies any chest discomfort, palpitations, or dyspnea.   BP is currently 158/94. Advised her to take an extra Cardizem CD 180mg . Continue with regular dosing of Lopressor and Coumadin.  If she develops symptoms or HR sustains > 130, advised to come to the ED.  He voiced understanding of this and was appreciative of the call.  Signed, Erma Heritage, PA-C 12/14/2015, 10:45 AM Pager: [phone]

## 2015-12-30 ENCOUNTER — Ambulatory Visit (INDEPENDENT_AMBULATORY_CARE_PROVIDER_SITE_OTHER): Payer: Medicare HMO | Admitting: *Deleted

## 2015-12-30 DIAGNOSIS — Z23 Encounter for immunization: Secondary | ICD-10-CM | POA: Diagnosis not present

## 2016-03-05 ENCOUNTER — Encounter: Payer: Self-pay | Admitting: *Deleted

## 2016-03-08 ENCOUNTER — Encounter: Payer: Self-pay | Admitting: Cardiology

## 2016-03-08 ENCOUNTER — Ambulatory Visit (INDEPENDENT_AMBULATORY_CARE_PROVIDER_SITE_OTHER): Payer: Medicare HMO | Admitting: Cardiology

## 2016-03-08 VITALS — BP 140/73 | HR 62 | Ht 63.0 in | Wt 214.0 lb

## 2016-03-08 DIAGNOSIS — I10 Essential (primary) hypertension: Secondary | ICD-10-CM | POA: Diagnosis not present

## 2016-03-08 DIAGNOSIS — E782 Mixed hyperlipidemia: Secondary | ICD-10-CM

## 2016-03-08 DIAGNOSIS — I4891 Unspecified atrial fibrillation: Secondary | ICD-10-CM

## 2016-03-08 DIAGNOSIS — I5033 Acute on chronic diastolic (congestive) heart failure: Secondary | ICD-10-CM | POA: Diagnosis not present

## 2016-03-08 MED ORDER — FUROSEMIDE 40 MG PO TABS: 40.0000 mg | ORAL_TABLET | Freq: Every day | ORAL | 6 refills | Status: DC | PRN

## 2016-03-08 NOTE — Patient Instructions (Signed)
Medication Instructions:   Increase Lasix to 40mg  daily as needed for edema / swelling.  Continue all other medications.    Labwork:  BMET, Magnesium - due in 2 weeks, around 03/22/2016 - orders given today.  Office will contact with results via phone or letter.    Testing/Procedures: none  Follow-Up: 1 month   Any Other Special Instructions Will Be Listed Below (If Applicable).  If you need a refill on your cardiac medications before your next appointment, please call your pharmacy.

## 2016-03-08 NOTE — Progress Notes (Signed)
Clinical Summary Miranda Garcia is a 82 y.o.female  seen today for follow up of the following medical problems.  1. Afib - no recent palpitations since last vsit. No bleeding issues on coumadin. Has not been interested NOACs.     2. HTN - checks at home occasionally, typically 140s/60-70s - compliant with meds.  3. Hyperlipidemia - compliant with statin - 07/2015: TC 114 TG 104 HDL 56 LDL 37   4. Chronic diastolic HF - increased LE edema, increased SOB. Taking lasix 20mg  daily. Weight is up 208 to 214 lbs.      SH: spends free time sewing, baking. Making 22 cakes over Christmas   Past Medical History:  Diagnosis Date  . Arthritis   . Atrial fibrillation ([place])   . Diabetes mellitus type II, controlled ([place])    x 15 yrs  . Elevated troponin   . Hemorrhoids   . Hypertension      Allergies  Allergen Reactions  . Penicillins Rash     Current Outpatient Prescriptions  Medication Sig Dispense Refill  . atorvastatin (LIPITOR) 20 MG tablet TAKE ONE (1) TABLET EACH DAY 6PM 30 tablet 6  . calcium citrate-vitamin D (CITRACAL+D) 315-200 MG-UNIT per tablet Take 1 tablet by mouth 2 (two) times daily.      . cholecalciferol (VITAMIN D) 1000 UNITS tablet Take 5,000 Units by mouth daily.     Marland Kitchen diltiazem (CARDIZEM CD) 180 MG 24 hr capsule TAKE ONE (1) CAPSULE EACH DAY 30 capsule 6  . furosemide (LASIX) 20 MG tablet Take 1 tablet (20 mg total) by mouth daily as needed for edema (swelling). 30 tablet 6  . Insulin Aspart Prot & Aspart (NOVOLOG MIX 70/30 [place]) Inject into the skin. 30 in morning and 15 at night.    . losartan (COZAAR) 50 MG tablet Take 1 tablet by mouth daily.    . metoprolol (LOPRESSOR) 50 MG tablet Take 50 mg by mouth 2 (two) times daily.      . Multiple Vitamin (MULTIVITAMIN WITH MINERALS) TABS Take 1 tablet by mouth daily.    . Multiple Vitamins-Minerals (PRESERVISION AREDS) TABS Take 1 tablet by mouth 2 (two) times daily.    Marland Kitchen warfarin (COUMADIN) 5  MG tablet Take 1 tablet (5 mg total) by mouth daily. Or as directed 45 tablet 3   No current facility-administered medications for this visit.    Facility-Administered Medications Ordered in Other Visits  Medication Dose Route Frequency Provider Last Rate Last Dose  . neomycin-polymyxin-dexameth (MAXITROL) 0.1 % ophth ointment    PRN Tonny Amayiah Gosnell, MD   1 application at 59/74/16 1427     Past Surgical History:  Procedure Laterality Date  . [place]   [facility]  . CATARACT EXTRACTION W/PHACO  01/11/2011   Procedure: CATARACT EXTRACTION PHACO AND INTRAOCULAR LENS PLACEMENT (IOC);  Surgeon: Tonny Quita Mcgrory;  Location: [facility];  Service: Ophthalmology;  Laterality: Right;  CDE 19.32  . CATARACT EXTRACTION W/PHACO  01/21/2011   Procedure: CATARACT EXTRACTION PHACO AND INTRAOCULAR LENS PLACEMENT (IOC);  Surgeon: Tonny Bellamy Judson;  Location: [facility];  Service: Ophthalmology;  Laterality: Left;  CDE: 21.19  . COLONOSCOPY N/A 09/12/2013   Procedure: COLONOSCOPY;  Surgeon: Rogene Houston, MD;  Location: [facility];  Service: Endoscopy;  Laterality: N/A;  125  . CYSTOSCOPY     [name]  . LITHOTRIPSY     [facility]  . PARTIAL HYSTERECTOMY       Allergies  Allergen Reactions  .  Penicillins Rash      Family History  Problem Relation Age of Onset  . Colon cancer Brother      Social History Miranda Garcia reports that she has never smoked. She has never used smokeless tobacco. Miranda Garcia reports that she does not drink alcohol.   Review of Systems CONSTITUTIONAL: No weight loss, fever, chills, weakness or fatigue.  HEENT: Eyes: No visual loss, blurred vision, double vision or yellow sclerae.No hearing loss, sneezing, congestion, runny nose or sore throat.  SKIN: No rash or itching.  CARDIOVASCULAR: per HPI RESPIRATORY: per HPI GASTROINTESTINAL: No anorexia, nausea, vomiting or diarrhea. No abdominal pain or blood.  GENITOURINARY: No burning on urination, no polyuria NEUROLOGICAL: No  headache, dizziness, syncope, paralysis, ataxia, numbness or tingling in the extremities. No change in bowel or bladder control.  MUSCULOSKELETAL: No muscle, back pain, joint pain or stiffness.  LYMPHATICS: No enlarged nodes. No history of splenectomy.  PSYCHIATRIC: No history of depression or anxiety.  ENDOCRINOLOGIC: No reports of sweating, cold or heat intolerance. No polyuria or polydipsia.  Marland Kitchen   Physical Examination Vitals:   03/08/16 1409  BP: 140/73  Pulse: 62   Vitals:   03/08/16 1409  Weight: 214 lb (97.1 kg)  Height: 5\' 3"  (1.6 m)    Gen: resting comfortably, no acute distress HEENT: no scleral icterus, pupils equal round and reactive, no palptable cervical adenopathy,  CV: RRR, no m/r/g, no jvd Resp: Clear to auscultation bilaterally GI: abdomen is soft, non-tender, non-distended, normal bowel sounds, no hepatosplenomegaly MSK: extremities are warm, no edema.  Skin: warm, no rash Neuro:  no focal deficits Psych: appropriate affect   Diagnostic Studies 02/2012 Echo: LVEF 60-65%, mild LVH, no WMAs, grade II diastolic dysfunction, mode LAE.  02/2012 MPI:  Abnormal Lexiscan Myoview with no diagnostic electrocardiographic changes. The scintigraphic results show breast attenuation. There is also mild ischemia in the mid and basal inferolateral wall. The gated ejection fraction was 80% and the wall motion was normal.    Assessment and Plan   1. Afib  - no current symptoms. EKG in clinic shows SR - CHADS2VASC score is 4, she will continue coumadin.   2. HTN  - she remains at goal, we will continue her current meds   3. Hyperlipidemia  - she is at goal, ontinue current statin   4. Acute on chronic diastolic HF  - recent weight gain and edema, increased SOB - she will increase lasix to 40mg  qday prn swelling, check BMET/Mg in 2 weeks.    F/u 3-4 weeks.      Arnoldo Lenis, M.D

## 2016-03-24 DIAGNOSIS — I5032 Chronic diastolic (congestive) heart failure: Secondary | ICD-10-CM | POA: Diagnosis not present

## 2016-03-31 ENCOUNTER — Telehealth: Payer: Self-pay | Admitting: *Deleted

## 2016-03-31 DIAGNOSIS — I5032 Chronic diastolic (congestive) heart failure: Secondary | ICD-10-CM

## 2016-03-31 NOTE — Telephone Encounter (Signed)
Pt says she is taking lasix 20 mg daily as needed (perDr Woody Seller) - developed cellulitis and was given keflex 500 mg tid (has now finished) - denies swelling in legs now - pt wanted to clarify if should remain on lasix 20 mg or resume 40 mg daily as needed - pt has f/u 1/30 - mailed pt lab orders

## 2016-03-31 NOTE — Telephone Encounter (Signed)
Pt aware - update medication list

## 2016-03-31 NOTE — Telephone Encounter (Signed)
I would stay on 20mg  as needed. If that does not work would be ok to take 40 mg for up to 2-3 days   Zandra Abts MD

## 2016-03-31 NOTE — Telephone Encounter (Signed)
-----   Message from Arnoldo Lenis, MD sent at 03/29/2016 11:07 AM EST ----- Mild decrease in kidney function compared to last check. Please clarify how often she has been taking her lasix. Repeat BMET/Mg in 1 month  Zandra Abts MD

## 2016-04-14 DIAGNOSIS — K589 Irritable bowel syndrome without diarrhea: Secondary | ICD-10-CM | POA: Diagnosis not present

## 2016-04-14 DIAGNOSIS — I4891 Unspecified atrial fibrillation: Secondary | ICD-10-CM | POA: Diagnosis not present

## 2016-04-20 ENCOUNTER — Encounter: Payer: Self-pay | Admitting: Cardiology

## 2016-04-20 ENCOUNTER — Ambulatory Visit (INDEPENDENT_AMBULATORY_CARE_PROVIDER_SITE_OTHER): Payer: Medicare HMO | Admitting: Cardiology

## 2016-04-20 VITALS — BP 115/66 | HR 66 | Ht 63.0 in | Wt 213.6 lb

## 2016-04-20 DIAGNOSIS — I5032 Chronic diastolic (congestive) heart failure: Secondary | ICD-10-CM

## 2016-04-20 NOTE — Progress Notes (Signed)
Clinical Summary Miranda Garcia is a 83 y.o.female seen today for follow up of the following medical problems. This is a focused visit on recent leg edema.   1. Chronic diastolic HF - last visit we increased her lasix to 40mg  prn. She took higher dose for several days, now back to 20mg  prn.  - swellng has now resolved. Reports recently treated for leg cellulitis, which signifincantly improved swelling.    SH: spends free time sewing, baking. Making 22 cakes over Christmas Past Medical History:  Diagnosis Date  . Arthritis   . Atrial fibrillation (Miranda Garcia)   . Diabetes mellitus type II, controlled ([place])    x 15 yrs  . Elevated troponin   . Hemorrhoids   . Hypertension      Allergies  Allergen Reactions  . Penicillins Rash     Current Outpatient Prescriptions  Medication Sig Dispense Refill  . atorvastatin (LIPITOR) 20 MG tablet TAKE ONE (1) TABLET EACH DAY 6PM 30 tablet 6  . calcium citrate-vitamin D (CITRACAL+D) 315-200 MG-UNIT per tablet Take 1 tablet by mouth daily.     Miranda Garcia diltiazem (CARDIZEM CD) 180 MG 24 hr capsule TAKE ONE (1) CAPSULE EACH DAY 30 capsule 6  . furosemide (LASIX) 20 MG tablet Take 20 mg by mouth daily as needed.    . Insulin Aspart Prot & Aspart (NOVOLOG MIX 70/30 [place]) Inject into the skin. 30 in morning and 15 at night.    . losartan (COZAAR) 100 MG tablet Take 1 tablet by mouth daily.    . metoprolol (LOPRESSOR) 50 MG tablet Take 50 mg by mouth 2 (two) times daily.      . Multiple Vitamin (MULTIVITAMIN WITH MINERALS) TABS Take 1 tablet by mouth daily.    . Multiple Vitamins-Minerals (PRESERVISION AREDS) TABS Take 1 tablet by mouth 2 (two) times daily.    . potassium chloride (K-DUR) 10 MEQ tablet Take 1 tablet by mouth daily as needed.    . vitamin E 400 UNIT capsule Take 400 Units by mouth daily.    Miranda Garcia warfarin (COUMADIN) 5 MG tablet Take 1 tablet (5 mg total) by mouth daily. Or as directed 45 tablet 3   No current facility-administered  medications for this visit.    Facility-Administered Medications Ordered in Other Visits  Medication Dose Route Frequency Provider Last Rate Last Dose  . neomycin-polymyxin-dexameth (MAXITROL) 0.1 % ophth ointment    PRN Miranda Reanna Scoggin, MD   1 application at 24/40/10 1427     Past Surgical History:  Procedure Laterality Date  . [place]   [facility]  . CATARACT EXTRACTION W/PHACO  01/11/2011   Procedure: CATARACT EXTRACTION PHACO AND INTRAOCULAR LENS PLACEMENT (IOC);  Surgeon: Miranda Garcia;  Location: [facility] ORS;  Service: Ophthalmology;  Laterality: Right;  CDE 19.32  . CATARACT EXTRACTION W/PHACO  01/21/2011   Procedure: CATARACT EXTRACTION PHACO AND INTRAOCULAR LENS PLACEMENT (IOC);  Surgeon: Miranda Garcia;  Location: [facility] ORS;  Service: Ophthalmology;  Laterality: Left;  CDE: 21.19  . COLONOSCOPY N/A 09/12/2013   Procedure: COLONOSCOPY;  Surgeon: Miranda Houston, MD;  Location: [facility] ENDO SUITE;  Service: Endoscopy;  Laterality: N/A;  125  . CYSTOSCOPY     Miranda Garcia  . LITHOTRIPSY     [facility]  . PARTIAL HYSTERECTOMY       Allergies  Allergen Reactions  . Penicillins Rash      Family History  Problem Relation Age of Onset  . Colon cancer Brother  Social History Miranda Garcia reports that she has never smoked. She has never used smokeless tobacco. Miranda Garcia reports that she does not drink alcohol.   Review of Systems CONSTITUTIONAL: No weight loss, fever, chills, weakness or fatigue.  HEENT: Eyes: No visual loss, blurred vision, double vision or yellow sclerae.No hearing loss, sneezing, congestion, runny nose or sore throat.  SKIN: No rash or itching.  CARDIOVASCULAR: per hpi RESPIRATORY: No shortness of breath, cough or sputum.  GASTROINTESTINAL: No anorexia, nausea, vomiting or diarrhea. No abdominal pain or blood.  GENITOURINARY: No burning on urination, no polyuria NEUROLOGICAL: No headache, dizziness, syncope, paralysis, ataxia, numbness or tingling in the  extremities. No change in bowel or bladder control.  MUSCULOSKELETAL: No muscle, back pain, joint pain or stiffness.  LYMPHATICS: No enlarged nodes. No history of splenectomy.  PSYCHIATRIC: No history of depression or anxiety.  ENDOCRINOLOGIC: No reports of sweating, cold or heat intolerance. No polyuria or polydipsia.  Miranda Garcia   Physical Examination Vitals:   04/20/16 1516  BP: 115/66  Pulse: 66   Vitals:   04/20/16 1516  Weight: 213 lb 9.6 oz (96.9 kg)  Height: 5\' 3"  (1.6 m)    Gen: resting comfortably, no acute distress HEENT: no scleral icterus, pupils equal round and reactive, no palptable cervical adenopathy,  CV: RRR, no m/r/g, no jvd Resp: Clear to auscultation bilaterally GI: abdomen is soft, non-tender, non-distended, normal bowel sounds, no hepatosplenomegaly MSK: extremities are warm, no edema.  Skin: warm, no rash Neuro:  no focal deficits Psych: appropriate affect   Diagnostic Studies  02/2012 Echo: LVEF 60-65%, mild LVH, no WMAs, grade II diastolic dysfunction, mode LAE.  02/2012 MPI:  Abnormal Lexiscan Myoview with no diagnostic electrocardiographic changes. The scintigraphic results show breast attenuation. There is also mild ischemia in the mid and basal inferolateral wall. The gated ejection fraction was 80% and the wall motion was normal.   Assessment and Plan   1. Chronic diastolic HF  - recent LE edema has resolved. She will continue her prn lasix.     Arnoldo Lenis, M.D.,

## 2016-04-20 NOTE — Patient Instructions (Signed)

## 2016-04-29 DIAGNOSIS — I10 Essential (primary) hypertension: Secondary | ICD-10-CM | POA: Diagnosis not present

## 2016-04-29 DIAGNOSIS — I4891 Unspecified atrial fibrillation: Secondary | ICD-10-CM | POA: Diagnosis not present

## 2016-04-29 DIAGNOSIS — I5032 Chronic diastolic (congestive) heart failure: Secondary | ICD-10-CM | POA: Diagnosis not present

## 2016-04-29 DIAGNOSIS — E119 Type 2 diabetes mellitus without complications: Secondary | ICD-10-CM | POA: Diagnosis not present

## 2016-05-05 ENCOUNTER — Telehealth: Payer: Self-pay | Admitting: *Deleted

## 2016-05-05 DIAGNOSIS — E7800 Pure hypercholesterolemia, unspecified: Secondary | ICD-10-CM | POA: Diagnosis not present

## 2016-05-05 DIAGNOSIS — N183 Chronic kidney disease, stage 3 unspecified: Secondary | ICD-10-CM

## 2016-05-05 DIAGNOSIS — E1122 Type 2 diabetes mellitus with diabetic chronic kidney disease: Secondary | ICD-10-CM | POA: Diagnosis not present

## 2016-05-05 DIAGNOSIS — I10 Essential (primary) hypertension: Secondary | ICD-10-CM | POA: Diagnosis not present

## 2016-05-05 DIAGNOSIS — Z299 Encounter for prophylactic measures, unspecified: Secondary | ICD-10-CM | POA: Diagnosis not present

## 2016-05-05 DIAGNOSIS — I4891 Unspecified atrial fibrillation: Secondary | ICD-10-CM | POA: Diagnosis not present

## 2016-05-05 NOTE — Telephone Encounter (Signed)
-----   Message from Arnoldo Lenis, MD sent at 05/03/2016  2:51 PM EST ----- Mild decrease in renal function. Clarify how often she is taking her lasix and what dose? Is she taking a lot of ibuprofen or motrin? Is she staying well hydrated, drinking lots of water daily (at least 4-6 glasses). For her water intake is ok, just want to limit her salt intake  J BrancH MD

## 2016-05-05 NOTE — Telephone Encounter (Signed)
Pt says she has been taking lasix 20 mg daily - says if she skips a day both feet swell - denies taking any ibuprofen - says she is taking in around 5 glasses of water daily and limiting salt intake - says she only has one kidney (was on yearly f/u with Dr. Rana Snare in [facility] nephrologist and was told her kidney function would always be decreased.

## 2016-05-05 NOTE — Telephone Encounter (Signed)
Pt voiced understanding - updated medication list and will mail pt lab orders

## 2016-05-05 NOTE — Addendum Note (Signed)
Addended by: Julian Hy T on: 05/05/2016 01:12 PM   Modules accepted: Orders

## 2016-05-05 NOTE — Telephone Encounter (Signed)
Her kidney function over the last few checks has gotten mildly worst. I'd like to lower her losartan to 50mg  daily and continue to monitor. Repeat BMET in 2 months   Carlyle Dolly MD

## 2016-05-12 ENCOUNTER — Other Ambulatory Visit: Payer: Self-pay | Admitting: Cardiology

## 2016-05-31 DIAGNOSIS — E119 Type 2 diabetes mellitus without complications: Secondary | ICD-10-CM | POA: Diagnosis not present

## 2016-05-31 DIAGNOSIS — I10 Essential (primary) hypertension: Secondary | ICD-10-CM | POA: Diagnosis not present

## 2016-05-31 DIAGNOSIS — I4891 Unspecified atrial fibrillation: Secondary | ICD-10-CM | POA: Diagnosis not present

## 2016-06-02 DIAGNOSIS — I4891 Unspecified atrial fibrillation: Secondary | ICD-10-CM | POA: Diagnosis not present

## 2016-06-02 DIAGNOSIS — G5600 Carpal tunnel syndrome, unspecified upper limb: Secondary | ICD-10-CM | POA: Diagnosis not present

## 2016-06-02 DIAGNOSIS — Z6837 Body mass index (BMI) 37.0-37.9, adult: Secondary | ICD-10-CM | POA: Diagnosis not present

## 2016-06-02 DIAGNOSIS — Z299 Encounter for prophylactic measures, unspecified: Secondary | ICD-10-CM | POA: Diagnosis not present

## 2016-06-02 DIAGNOSIS — Z713 Dietary counseling and surveillance: Secondary | ICD-10-CM | POA: Diagnosis not present

## 2016-06-02 DIAGNOSIS — E1122 Type 2 diabetes mellitus with diabetic chronic kidney disease: Secondary | ICD-10-CM | POA: Diagnosis not present

## 2016-06-10 ENCOUNTER — Other Ambulatory Visit: Payer: Self-pay | Admitting: Cardiology

## 2016-06-23 DIAGNOSIS — R69 Illness, unspecified: Secondary | ICD-10-CM | POA: Diagnosis not present

## 2016-06-30 DIAGNOSIS — Z6837 Body mass index (BMI) 37.0-37.9, adult: Secondary | ICD-10-CM | POA: Diagnosis not present

## 2016-06-30 DIAGNOSIS — Z713 Dietary counseling and surveillance: Secondary | ICD-10-CM | POA: Diagnosis not present

## 2016-06-30 DIAGNOSIS — N183 Chronic kidney disease, stage 3 (moderate): Secondary | ICD-10-CM | POA: Diagnosis not present

## 2016-06-30 DIAGNOSIS — E1122 Type 2 diabetes mellitus with diabetic chronic kidney disease: Secondary | ICD-10-CM | POA: Diagnosis not present

## 2016-06-30 DIAGNOSIS — Z299 Encounter for prophylactic measures, unspecified: Secondary | ICD-10-CM | POA: Diagnosis not present

## 2016-06-30 DIAGNOSIS — E7800 Pure hypercholesterolemia, unspecified: Secondary | ICD-10-CM | POA: Diagnosis not present

## 2016-06-30 DIAGNOSIS — G5600 Carpal tunnel syndrome, unspecified upper limb: Secondary | ICD-10-CM | POA: Diagnosis not present

## 2016-06-30 DIAGNOSIS — I4891 Unspecified atrial fibrillation: Secondary | ICD-10-CM | POA: Diagnosis not present

## 2016-06-30 DIAGNOSIS — I10 Essential (primary) hypertension: Secondary | ICD-10-CM | POA: Diagnosis not present

## 2016-07-30 DIAGNOSIS — E119 Type 2 diabetes mellitus without complications: Secondary | ICD-10-CM | POA: Diagnosis not present

## 2016-07-30 DIAGNOSIS — I4891 Unspecified atrial fibrillation: Secondary | ICD-10-CM | POA: Diagnosis not present

## 2016-07-30 DIAGNOSIS — I10 Essential (primary) hypertension: Secondary | ICD-10-CM | POA: Diagnosis not present

## 2016-08-02 DIAGNOSIS — Z1389 Encounter for screening for other disorder: Secondary | ICD-10-CM | POA: Diagnosis not present

## 2016-08-02 DIAGNOSIS — R710 Precipitous drop in hematocrit: Secondary | ICD-10-CM | POA: Diagnosis not present

## 2016-08-02 DIAGNOSIS — G5600 Carpal tunnel syndrome, unspecified upper limb: Secondary | ICD-10-CM | POA: Diagnosis not present

## 2016-08-02 DIAGNOSIS — Z1211 Encounter for screening for malignant neoplasm of colon: Secondary | ICD-10-CM | POA: Diagnosis not present

## 2016-08-02 DIAGNOSIS — E7800 Pure hypercholesterolemia, unspecified: Secondary | ICD-10-CM | POA: Diagnosis not present

## 2016-08-02 DIAGNOSIS — Z79899 Other long term (current) drug therapy: Secondary | ICD-10-CM | POA: Diagnosis not present

## 2016-08-02 DIAGNOSIS — Z299 Encounter for prophylactic measures, unspecified: Secondary | ICD-10-CM | POA: Diagnosis not present

## 2016-08-02 DIAGNOSIS — Z0000 Encounter for general adult medical examination without abnormal findings: Secondary | ICD-10-CM | POA: Diagnosis not present

## 2016-08-02 DIAGNOSIS — R718 Other abnormality of red blood cells: Secondary | ICD-10-CM | POA: Diagnosis not present

## 2016-08-02 DIAGNOSIS — N183 Chronic kidney disease, stage 3 (moderate): Secondary | ICD-10-CM | POA: Diagnosis not present

## 2016-08-02 DIAGNOSIS — Z7189 Other specified counseling: Secondary | ICD-10-CM | POA: Diagnosis not present

## 2016-08-02 DIAGNOSIS — I4891 Unspecified atrial fibrillation: Secondary | ICD-10-CM | POA: Diagnosis not present

## 2016-08-04 DIAGNOSIS — R69 Illness, unspecified: Secondary | ICD-10-CM | POA: Diagnosis not present

## 2016-08-05 ENCOUNTER — Other Ambulatory Visit: Payer: Self-pay | Admitting: Cardiology

## 2016-08-05 DIAGNOSIS — Z713 Dietary counseling and surveillance: Secondary | ICD-10-CM | POA: Diagnosis not present

## 2016-08-05 DIAGNOSIS — E1122 Type 2 diabetes mellitus with diabetic chronic kidney disease: Secondary | ICD-10-CM | POA: Diagnosis not present

## 2016-08-05 DIAGNOSIS — Z299 Encounter for prophylactic measures, unspecified: Secondary | ICD-10-CM | POA: Diagnosis not present

## 2016-08-05 DIAGNOSIS — I4891 Unspecified atrial fibrillation: Secondary | ICD-10-CM | POA: Diagnosis not present

## 2016-08-05 DIAGNOSIS — I10 Essential (primary) hypertension: Secondary | ICD-10-CM | POA: Diagnosis not present

## 2016-08-05 DIAGNOSIS — Z6837 Body mass index (BMI) 37.0-37.9, adult: Secondary | ICD-10-CM | POA: Diagnosis not present

## 2016-08-05 DIAGNOSIS — N184 Chronic kidney disease, stage 4 (severe): Secondary | ICD-10-CM | POA: Diagnosis not present

## 2016-08-05 DIAGNOSIS — G5600 Carpal tunnel syndrome, unspecified upper limb: Secondary | ICD-10-CM | POA: Diagnosis not present

## 2016-08-05 DIAGNOSIS — E7800 Pure hypercholesterolemia, unspecified: Secondary | ICD-10-CM | POA: Diagnosis not present

## 2016-08-17 DIAGNOSIS — I10 Essential (primary) hypertension: Secondary | ICD-10-CM | POA: Diagnosis not present

## 2016-08-17 DIAGNOSIS — I4891 Unspecified atrial fibrillation: Secondary | ICD-10-CM | POA: Diagnosis not present

## 2016-08-17 DIAGNOSIS — G5600 Carpal tunnel syndrome, unspecified upper limb: Secondary | ICD-10-CM | POA: Diagnosis not present

## 2016-08-17 DIAGNOSIS — Z6837 Body mass index (BMI) 37.0-37.9, adult: Secondary | ICD-10-CM | POA: Diagnosis not present

## 2016-08-17 DIAGNOSIS — E1122 Type 2 diabetes mellitus with diabetic chronic kidney disease: Secondary | ICD-10-CM | POA: Diagnosis not present

## 2016-08-17 DIAGNOSIS — E7800 Pure hypercholesterolemia, unspecified: Secondary | ICD-10-CM | POA: Diagnosis not present

## 2016-08-17 DIAGNOSIS — Z299 Encounter for prophylactic measures, unspecified: Secondary | ICD-10-CM | POA: Diagnosis not present

## 2016-08-17 DIAGNOSIS — N184 Chronic kidney disease, stage 4 (severe): Secondary | ICD-10-CM | POA: Diagnosis not present

## 2016-08-17 DIAGNOSIS — Z713 Dietary counseling and surveillance: Secondary | ICD-10-CM | POA: Diagnosis not present

## 2016-08-25 DIAGNOSIS — I10 Essential (primary) hypertension: Secondary | ICD-10-CM | POA: Diagnosis not present

## 2016-08-25 DIAGNOSIS — E119 Type 2 diabetes mellitus without complications: Secondary | ICD-10-CM | POA: Diagnosis not present

## 2016-08-25 DIAGNOSIS — I4891 Unspecified atrial fibrillation: Secondary | ICD-10-CM | POA: Diagnosis not present

## 2016-08-26 ENCOUNTER — Telehealth: Payer: Self-pay | Admitting: *Deleted

## 2016-08-26 DIAGNOSIS — Z79899 Other long term (current) drug therapy: Secondary | ICD-10-CM | POA: Diagnosis not present

## 2016-08-26 NOTE — Telephone Encounter (Signed)
-----   Message from Arnoldo Lenis, MD sent at 08/25/2016 10:59 AM EDT ----- Kidney function remains decreased, though fairly stable compared to prior visit. I would try to limit her fluid pill as much as she can, I would tolerate a little leg swelling. We will continue to monitor.   J BrancH MD

## 2016-08-26 NOTE — Telephone Encounter (Signed)
Pt voiced understanding - routed to pcp  

## 2016-09-10 DIAGNOSIS — H35371 Puckering of macula, right eye: Secondary | ICD-10-CM | POA: Diagnosis not present

## 2016-09-10 DIAGNOSIS — H00022 Hordeolum internum right lower eyelid: Secondary | ICD-10-CM | POA: Diagnosis not present

## 2016-09-10 DIAGNOSIS — N184 Chronic kidney disease, stage 4 (severe): Secondary | ICD-10-CM | POA: Diagnosis not present

## 2016-09-10 DIAGNOSIS — G5600 Carpal tunnel syndrome, unspecified upper limb: Secondary | ICD-10-CM | POA: Diagnosis not present

## 2016-09-10 DIAGNOSIS — Z299 Encounter for prophylactic measures, unspecified: Secondary | ICD-10-CM | POA: Diagnosis not present

## 2016-09-10 DIAGNOSIS — E1122 Type 2 diabetes mellitus with diabetic chronic kidney disease: Secondary | ICD-10-CM | POA: Diagnosis not present

## 2016-09-10 DIAGNOSIS — I10 Essential (primary) hypertension: Secondary | ICD-10-CM | POA: Diagnosis not present

## 2016-09-10 DIAGNOSIS — Z6837 Body mass index (BMI) 37.0-37.9, adult: Secondary | ICD-10-CM | POA: Diagnosis not present

## 2016-09-10 DIAGNOSIS — E781 Pure hyperglyceridemia: Secondary | ICD-10-CM | POA: Diagnosis not present

## 2016-09-10 DIAGNOSIS — R5383 Other fatigue: Secondary | ICD-10-CM | POA: Diagnosis not present

## 2016-09-10 DIAGNOSIS — H353131 Nonexudative age-related macular degeneration, bilateral, early dry stage: Secondary | ICD-10-CM | POA: Diagnosis not present

## 2016-09-10 DIAGNOSIS — I4891 Unspecified atrial fibrillation: Secondary | ICD-10-CM | POA: Diagnosis not present

## 2016-09-10 DIAGNOSIS — H00029 Hordeolum internum unspecified eye, unspecified eyelid: Secondary | ICD-10-CM | POA: Diagnosis not present

## 2016-09-10 DIAGNOSIS — Z713 Dietary counseling and surveillance: Secondary | ICD-10-CM | POA: Diagnosis not present

## 2016-09-27 DIAGNOSIS — I4891 Unspecified atrial fibrillation: Secondary | ICD-10-CM | POA: Diagnosis not present

## 2016-09-27 DIAGNOSIS — E119 Type 2 diabetes mellitus without complications: Secondary | ICD-10-CM | POA: Diagnosis not present

## 2016-09-27 DIAGNOSIS — I10 Essential (primary) hypertension: Secondary | ICD-10-CM | POA: Diagnosis not present

## 2016-10-02 DIAGNOSIS — M2242 Chondromalacia patellae, left knee: Secondary | ICD-10-CM | POA: Diagnosis not present

## 2016-10-08 ENCOUNTER — Telehealth: Payer: Self-pay | Admitting: Cardiology

## 2016-10-08 ENCOUNTER — Ambulatory Visit (INDEPENDENT_AMBULATORY_CARE_PROVIDER_SITE_OTHER): Payer: Medicare HMO | Admitting: Cardiology

## 2016-10-08 ENCOUNTER — Encounter: Payer: Self-pay | Admitting: Cardiology

## 2016-10-08 VITALS — BP 127/74 | HR 66 | Ht 63.0 in | Wt 206.0 lb

## 2016-10-08 DIAGNOSIS — R011 Cardiac murmur, unspecified: Secondary | ICD-10-CM

## 2016-10-08 DIAGNOSIS — I10 Essential (primary) hypertension: Secondary | ICD-10-CM

## 2016-10-08 DIAGNOSIS — I5032 Chronic diastolic (congestive) heart failure: Secondary | ICD-10-CM | POA: Diagnosis not present

## 2016-10-08 DIAGNOSIS — E782 Mixed hyperlipidemia: Secondary | ICD-10-CM

## 2016-10-08 DIAGNOSIS — N184 Chronic kidney disease, stage 4 (severe): Secondary | ICD-10-CM | POA: Diagnosis not present

## 2016-10-08 DIAGNOSIS — E1122 Type 2 diabetes mellitus with diabetic chronic kidney disease: Secondary | ICD-10-CM | POA: Diagnosis not present

## 2016-10-08 DIAGNOSIS — G5600 Carpal tunnel syndrome, unspecified upper limb: Secondary | ICD-10-CM | POA: Diagnosis not present

## 2016-10-08 DIAGNOSIS — R0989 Other specified symptoms and signs involving the circulatory and respiratory systems: Secondary | ICD-10-CM | POA: Diagnosis not present

## 2016-10-08 DIAGNOSIS — I4891 Unspecified atrial fibrillation: Secondary | ICD-10-CM | POA: Diagnosis not present

## 2016-10-08 DIAGNOSIS — Z6837 Body mass index (BMI) 37.0-37.9, adult: Secondary | ICD-10-CM | POA: Diagnosis not present

## 2016-10-08 DIAGNOSIS — E781 Pure hyperglyceridemia: Secondary | ICD-10-CM | POA: Diagnosis not present

## 2016-10-08 DIAGNOSIS — Z299 Encounter for prophylactic measures, unspecified: Secondary | ICD-10-CM | POA: Diagnosis not present

## 2016-10-08 NOTE — Progress Notes (Signed)
Clinical Summary Ms. Fregeau is a 83 y.o.female seen today for follow up of the following medical problems.   1. Chronic diastolic HF - working to limit diuretic due to her renal dysfunction - taking lasix 20mg  prn - limiting sodium intake. Has compression stockings.  2. Afib - no recent palpitations. No bleeding on coumadin, she has not had interest in NOACs   3. HTN - checks at home, typically around 120-140s/70s  4. Hyperlipidemia - compliant with statin - 07/2015: TC 114 TG 104 HDL 56 LDL 37  SH: spends free time sewing, baking. Past Medical History:  Diagnosis Date  . Arthritis   . Atrial fibrillation ([place])   . Diabetes mellitus type II, controlled ([place])    x 15 yrs  . Elevated troponin   . Hemorrhoids   . Hypertension      Allergies  Allergen Reactions  . Penicillins Rash     Current Outpatient Prescriptions  Medication Sig Dispense Refill  . atorvastatin (LIPITOR) 20 MG tablet TAKE ONE (1) TABLET EACH DAY 6PM 30 tablet 6  . calcium citrate-vitamin D (CITRACAL+D) 315-200 MG-UNIT per tablet Take 1 tablet by mouth daily.     Marland Kitchen diltiazem (CARDIZEM CD) 180 MG 24 hr capsule TAKE ONE (1) CAPSULE EACH DAY 90 capsule 3  . furosemide (LASIX) 20 MG tablet Take 20 mg by mouth daily as needed.    . furosemide (LASIX) 40 MG tablet TAKE 1 TABLET DAILY AS NEEDED FOR SWELLING 90 tablet 1  . Insulin Aspart Prot & Aspart (NOVOLOG MIX 70/30 [place]) Inject into the skin. 30 in morning and 15 at night.    . losartan (COZAAR) 50 MG tablet Take 50 mg by mouth daily.    . metoprolol (LOPRESSOR) 50 MG tablet Take 50 mg by mouth 2 (two) times daily.      . Multiple Vitamin (MULTIVITAMIN WITH MINERALS) TABS Take 1 tablet by mouth daily.    . Multiple Vitamins-Minerals (PRESERVISION AREDS) TABS Take 1 tablet by mouth 2 (two) times daily.    . potassium chloride (K-DUR) 10 MEQ tablet Take 1 tablet by mouth daily as needed.    . vitamin E 400 UNIT capsule Take 400 Units by  mouth daily.    Marland Kitchen warfarin (COUMADIN) 5 MG tablet Take 1 tablet (5 mg total) by mouth daily. Or as directed 45 tablet 3   No current facility-administered medications for this visit.    Facility-Administered Medications Ordered in Other Visits  Medication Dose Route Frequency Provider Last Rate Last Dose  . neomycin-polymyxin-dexameth (MAXITROL) 0.1 % ophth ointment    PRN Tonny Tarsha Blando, MD   1 application at 76/19/50 1427     Past Surgical History:  Procedure Laterality Date  . [place]   [facility]  . CATARACT EXTRACTION W/PHACO  01/11/2011   Procedure: CATARACT EXTRACTION PHACO AND INTRAOCULAR LENS PLACEMENT (IOC);  Surgeon: Tonny Danish Ruffins;  Location: [facility] OR[facility];  Service: Ophthalmology;  Laterality: Right;  CDE 19.32  . CATARACT EXTRACTION W/PHACO  01/21/2011   Procedure: CATARACT EXTRACTION PHACO AND INTRAOCULAR LENS PLACEMENT (IOC);  Surgeon: Tonny Addam Goeller;  Location: [facility] ORS;  Service: Ophthalmology;  Laterality: Left;  CDE: 21.19  . COLONOSCOPY N/A 09/12/2013   Procedure: COLONOSCOPY;  Surgeon: Rogene Houston, MD;  Location: [facility];  Service: Endoscopy;  Laterality: N/A;  125  . CYSTOSCOPY     [name]  . LITHOTRIPSY     [facility]  . PARTIAL HYSTERECTOMY  Allergies  Allergen Reactions  . Penicillins Rash      Family History  Problem Relation Age of Onset  . Colon cancer Brother      Social History Ms. Watters reports that she has never smoked. She has never used smokeless tobacco. Ms. Weichel reports that she does not drink alcohol.   Review of Systems CONSTITUTIONAL: No weight loss, fever, chills, weakness or fatigue.  HEENT: Eyes: No visual loss, blurred vision, double vision or yellow sclerae.No hearing loss, sneezing, congestion, runny nose or sore throat.  SKIN: No rash or itching.  CARDIOVASCULAR: per hpi RESPIRATORY: No shortness of breath, cough or sputum.  GASTROINTESTINAL: No anorexia, nausea, vomiting or diarrhea. No abdominal pain or blood.    GENITOURINARY: No burning on urination, no polyuria NEUROLOGICAL: No headache, dizziness, syncope, paralysis, ataxia, numbness or tingling in the extremities. No change in bowel or bladder control.  MUSCULOSKELETAL: No muscle, back pain, joint pain or stiffness.  LYMPHATICS: No enlarged nodes. No history of splenectomy.  PSYCHIATRIC: No history of depression or anxiety.  ENDOCRINOLOGIC: No reports of sweating, cold or heat intolerance. No polyuria or polydipsia.  Marland Kitchen   Physical Examination Vitals:   10/08/16 0955  BP: 127/74  Pulse: 66   Vitals:   10/08/16 0955  Weight: 206 lb (93.4 kg)  Height: 5\' 3"  (1.6 m)    Gen: resting comfortably, no acute distress HEENT: no scleral icterus, pupils equal round and reactive, no palptable cervical adenopathy,  CV: RRR, 2/6 systolic murmur at apex, no jvd. Bilateral carotid bruits Resp: Clear to auscultation bilaterally GI: abdomen is soft, non-tender, non-distended, normal bowel sounds, no hepatosplenomegaly MSK: extremities are warm, no edema.  Skin: warm, no rash Neuro:  no focal deficits Psych: appropriate affect   Diagnostic Studies 02/2012 Echo: LVEF 60-65%, mild LVH, no WMAs, grade II diastolic dysfunction, mode LAE.  02/2012 MPI:  Abnormal Lexiscan Myoview with no diagnostic electrocardiographic changes. The scintigraphic results show breast attenuation. There is also mild ischemia in the mid and basal inferolateral wall. The gated ejection fraction was 80% and the wall motion was normal.     Assessment and Plan  1. Chronic diastolic HF  - doing well on current diuretic, she will continue  2. Afib  - no current symptoms. - CHADS2VASC score is 4,  continue coumadin.   3. HTN  - bp is at goal, continue current meds  4. Hyperlipidemia  - lipids at goal, contineu statin. Request most recent labs from pcp  5. Heart murmur - obtain echo  6. Carotid bruits - obtain carotid US   F/u 6  months     Arnoldo Lenis, M.D., F.A.C.C.

## 2016-10-08 NOTE — Patient Instructions (Signed)
Your physician wants you to follow-up in: [place] will receive a reminder letter in the mail two months in advance. If you don't receive a letter, please call our office to schedule the follow-up appointment.  Your physician recommends that you continue on your current medications as directed. Please refer to the Current Medication list given to you today.  Your physician has requested that you have an echocardiogram. Echocardiography is a painless test that uses sound waves to create images of your heart. It provides your doctor with information about the size and shape of your heart and how well your heart's chambers and valves are working. This procedure takes approximately one hour. There are no restrictions for this procedure.  Your physician has requested that you have a carotid duplex. This test is an ultrasound of the carotid arteries in your neck. It looks at blood flow through these arteries that supply the brain with blood. Allow one hour for this exam. There are no restrictions or special instructions.  Thank you for choosing [facility]!!

## 2016-10-08 NOTE — Telephone Encounter (Signed)
Pre-cert Verification for the following procedure   Echo & Cartoid Studies scheduled for 11/10/16.

## 2016-11-10 ENCOUNTER — Other Ambulatory Visit: Payer: Self-pay

## 2016-11-10 ENCOUNTER — Ambulatory Visit (INDEPENDENT_AMBULATORY_CARE_PROVIDER_SITE_OTHER): Payer: Medicare HMO

## 2016-11-10 ENCOUNTER — Ambulatory Visit: Payer: Medicare HMO

## 2016-11-10 DIAGNOSIS — R011 Cardiac murmur, unspecified: Secondary | ICD-10-CM

## 2016-11-10 DIAGNOSIS — Z6837 Body mass index (BMI) 37.0-37.9, adult: Secondary | ICD-10-CM | POA: Diagnosis not present

## 2016-11-10 DIAGNOSIS — I4891 Unspecified atrial fibrillation: Secondary | ICD-10-CM | POA: Diagnosis not present

## 2016-11-10 DIAGNOSIS — Z299 Encounter for prophylactic measures, unspecified: Secondary | ICD-10-CM | POA: Diagnosis not present

## 2016-11-10 DIAGNOSIS — R0989 Other specified symptoms and signs involving the circulatory and respiratory systems: Secondary | ICD-10-CM | POA: Diagnosis not present

## 2016-11-10 DIAGNOSIS — Z713 Dietary counseling and surveillance: Secondary | ICD-10-CM | POA: Diagnosis not present

## 2016-11-10 LAB — VAS US CAROTID
LCCADDIAS: -20 cm/s
LEFT ECA DIAS: 0 cm/s
LEFT VERTEBRAL DIAS: -15 cm/s
Left CCA dist sys: -97 cm/s
Left CCA prox dias: 9 cm/s
Left CCA prox sys: 73 cm/s
Left ICA dist dias: -32 cm/s
Left ICA dist sys: -143 cm/s
Left ICA prox dias: 11 cm/s
Left ICA prox sys: 100 cm/s
RCCADSYS: -148 cm/s
RCCAPDIAS: 17 cm/s
RIGHT ECA DIAS: 0 cm/s
RIGHT VERTEBRAL DIAS: -11 cm/s
Right CCA prox sys: 77 cm/s

## 2016-11-17 ENCOUNTER — Telehealth: Payer: Self-pay | Admitting: *Deleted

## 2016-11-17 NOTE — Telephone Encounter (Signed)
-----   Message from Arnoldo Lenis, MD sent at 11/15/2016  3:53 PM EDT ----- Echo looks good, normal heart function  J BrancH MD

## 2016-11-17 NOTE — Telephone Encounter (Signed)
  Pt aware - routed to pcp      Carotid US shows only very mild blockags, we will continue to monitor, not a concern at this time   J BrancH MD

## 2016-11-23 DIAGNOSIS — E781 Pure hyperglyceridemia: Secondary | ICD-10-CM | POA: Diagnosis not present

## 2016-11-23 DIAGNOSIS — I10 Essential (primary) hypertension: Secondary | ICD-10-CM | POA: Diagnosis not present

## 2016-11-23 DIAGNOSIS — Z6837 Body mass index (BMI) 37.0-37.9, adult: Secondary | ICD-10-CM | POA: Diagnosis not present

## 2016-11-23 DIAGNOSIS — N184 Chronic kidney disease, stage 4 (severe): Secondary | ICD-10-CM | POA: Diagnosis not present

## 2016-11-23 DIAGNOSIS — Z789 Other specified health status: Secondary | ICD-10-CM | POA: Diagnosis not present

## 2016-11-23 DIAGNOSIS — Z299 Encounter for prophylactic measures, unspecified: Secondary | ICD-10-CM | POA: Diagnosis not present

## 2016-11-23 DIAGNOSIS — E1165 Type 2 diabetes mellitus with hyperglycemia: Secondary | ICD-10-CM | POA: Diagnosis not present

## 2016-11-23 DIAGNOSIS — E1122 Type 2 diabetes mellitus with diabetic chronic kidney disease: Secondary | ICD-10-CM | POA: Diagnosis not present

## 2016-11-23 DIAGNOSIS — E7800 Pure hypercholesterolemia, unspecified: Secondary | ICD-10-CM | POA: Diagnosis not present

## 2016-11-23 DIAGNOSIS — I4891 Unspecified atrial fibrillation: Secondary | ICD-10-CM | POA: Diagnosis not present

## 2016-11-24 DIAGNOSIS — I4891 Unspecified atrial fibrillation: Secondary | ICD-10-CM | POA: Diagnosis not present

## 2016-11-24 DIAGNOSIS — I10 Essential (primary) hypertension: Secondary | ICD-10-CM | POA: Diagnosis not present

## 2016-11-24 DIAGNOSIS — E119 Type 2 diabetes mellitus without complications: Secondary | ICD-10-CM | POA: Diagnosis not present

## 2016-12-13 DIAGNOSIS — Z6837 Body mass index (BMI) 37.0-37.9, adult: Secondary | ICD-10-CM | POA: Diagnosis not present

## 2016-12-13 DIAGNOSIS — I4891 Unspecified atrial fibrillation: Secondary | ICD-10-CM | POA: Diagnosis not present

## 2016-12-13 DIAGNOSIS — Z299 Encounter for prophylactic measures, unspecified: Secondary | ICD-10-CM | POA: Diagnosis not present

## 2016-12-13 DIAGNOSIS — Z713 Dietary counseling and surveillance: Secondary | ICD-10-CM | POA: Diagnosis not present

## 2016-12-22 DIAGNOSIS — I4891 Unspecified atrial fibrillation: Secondary | ICD-10-CM | POA: Diagnosis not present

## 2016-12-22 DIAGNOSIS — I10 Essential (primary) hypertension: Secondary | ICD-10-CM | POA: Diagnosis not present

## 2016-12-22 DIAGNOSIS — E119 Type 2 diabetes mellitus without complications: Secondary | ICD-10-CM | POA: Diagnosis not present

## 2017-01-10 DIAGNOSIS — N184 Chronic kidney disease, stage 4 (severe): Secondary | ICD-10-CM | POA: Diagnosis not present

## 2017-01-10 DIAGNOSIS — E1165 Type 2 diabetes mellitus with hyperglycemia: Secondary | ICD-10-CM | POA: Diagnosis not present

## 2017-01-10 DIAGNOSIS — N183 Chronic kidney disease, stage 3 (moderate): Secondary | ICD-10-CM | POA: Diagnosis not present

## 2017-01-10 DIAGNOSIS — Z299 Encounter for prophylactic measures, unspecified: Secondary | ICD-10-CM | POA: Diagnosis not present

## 2017-01-10 DIAGNOSIS — Z6837 Body mass index (BMI) 37.0-37.9, adult: Secondary | ICD-10-CM | POA: Diagnosis not present

## 2017-01-10 DIAGNOSIS — I4891 Unspecified atrial fibrillation: Secondary | ICD-10-CM | POA: Diagnosis not present

## 2017-01-10 DIAGNOSIS — E7800 Pure hypercholesterolemia, unspecified: Secondary | ICD-10-CM | POA: Diagnosis not present

## 2017-01-10 DIAGNOSIS — E1122 Type 2 diabetes mellitus with diabetic chronic kidney disease: Secondary | ICD-10-CM | POA: Diagnosis not present

## 2017-01-10 DIAGNOSIS — I10 Essential (primary) hypertension: Secondary | ICD-10-CM | POA: Diagnosis not present

## 2017-01-13 DIAGNOSIS — R69 Illness, unspecified: Secondary | ICD-10-CM | POA: Diagnosis not present

## 2017-01-18 DIAGNOSIS — R69 Illness, unspecified: Secondary | ICD-10-CM | POA: Diagnosis not present

## 2017-01-31 DIAGNOSIS — R69 Illness, unspecified: Secondary | ICD-10-CM | POA: Diagnosis not present

## 2017-02-07 DIAGNOSIS — E1165 Type 2 diabetes mellitus with hyperglycemia: Secondary | ICD-10-CM | POA: Diagnosis not present

## 2017-02-07 DIAGNOSIS — E1122 Type 2 diabetes mellitus with diabetic chronic kidney disease: Secondary | ICD-10-CM | POA: Diagnosis not present

## 2017-02-07 DIAGNOSIS — I4891 Unspecified atrial fibrillation: Secondary | ICD-10-CM | POA: Diagnosis not present

## 2017-02-07 DIAGNOSIS — Z6838 Body mass index (BMI) 38.0-38.9, adult: Secondary | ICD-10-CM | POA: Diagnosis not present

## 2017-02-07 DIAGNOSIS — Z299 Encounter for prophylactic measures, unspecified: Secondary | ICD-10-CM | POA: Diagnosis not present

## 2017-02-07 DIAGNOSIS — R69 Illness, unspecified: Secondary | ICD-10-CM | POA: Diagnosis not present

## 2017-02-09 DIAGNOSIS — E119 Type 2 diabetes mellitus without complications: Secondary | ICD-10-CM | POA: Diagnosis not present

## 2017-02-09 DIAGNOSIS — I10 Essential (primary) hypertension: Secondary | ICD-10-CM | POA: Diagnosis not present

## 2017-02-09 DIAGNOSIS — I4891 Unspecified atrial fibrillation: Secondary | ICD-10-CM | POA: Diagnosis not present

## 2017-02-24 DIAGNOSIS — Z6837 Body mass index (BMI) 37.0-37.9, adult: Secondary | ICD-10-CM | POA: Diagnosis not present

## 2017-02-24 DIAGNOSIS — Z713 Dietary counseling and surveillance: Secondary | ICD-10-CM | POA: Diagnosis not present

## 2017-02-24 DIAGNOSIS — Z789 Other specified health status: Secondary | ICD-10-CM | POA: Diagnosis not present

## 2017-02-24 DIAGNOSIS — J329 Chronic sinusitis, unspecified: Secondary | ICD-10-CM | POA: Diagnosis not present

## 2017-02-24 DIAGNOSIS — Z299 Encounter for prophylactic measures, unspecified: Secondary | ICD-10-CM | POA: Diagnosis not present

## 2017-03-03 DIAGNOSIS — I4891 Unspecified atrial fibrillation: Secondary | ICD-10-CM | POA: Diagnosis not present

## 2017-03-03 DIAGNOSIS — Z6836 Body mass index (BMI) 36.0-36.9, adult: Secondary | ICD-10-CM | POA: Diagnosis not present

## 2017-03-03 DIAGNOSIS — E1165 Type 2 diabetes mellitus with hyperglycemia: Secondary | ICD-10-CM | POA: Diagnosis not present

## 2017-03-03 DIAGNOSIS — N184 Chronic kidney disease, stage 4 (severe): Secondary | ICD-10-CM | POA: Diagnosis not present

## 2017-03-03 DIAGNOSIS — Z299 Encounter for prophylactic measures, unspecified: Secondary | ICD-10-CM | POA: Diagnosis not present

## 2017-03-03 DIAGNOSIS — E1122 Type 2 diabetes mellitus with diabetic chronic kidney disease: Secondary | ICD-10-CM | POA: Diagnosis not present

## 2017-03-07 DIAGNOSIS — I4891 Unspecified atrial fibrillation: Secondary | ICD-10-CM | POA: Diagnosis not present

## 2017-03-07 DIAGNOSIS — E119 Type 2 diabetes mellitus without complications: Secondary | ICD-10-CM | POA: Diagnosis not present

## 2017-03-07 DIAGNOSIS — I10 Essential (primary) hypertension: Secondary | ICD-10-CM | POA: Diagnosis not present

## 2017-03-10 DIAGNOSIS — R69 Illness, unspecified: Secondary | ICD-10-CM | POA: Diagnosis not present

## 2017-03-18 DIAGNOSIS — I10 Essential (primary) hypertension: Secondary | ICD-10-CM | POA: Diagnosis not present

## 2017-03-18 DIAGNOSIS — Z299 Encounter for prophylactic measures, unspecified: Secondary | ICD-10-CM | POA: Diagnosis not present

## 2017-03-18 DIAGNOSIS — R05 Cough: Secondary | ICD-10-CM | POA: Diagnosis not present

## 2017-03-18 DIAGNOSIS — J209 Acute bronchitis, unspecified: Secondary | ICD-10-CM | POA: Diagnosis not present

## 2017-03-18 DIAGNOSIS — I517 Cardiomegaly: Secondary | ICD-10-CM | POA: Diagnosis not present

## 2017-03-18 DIAGNOSIS — Z6836 Body mass index (BMI) 36.0-36.9, adult: Secondary | ICD-10-CM | POA: Diagnosis not present

## 2017-03-18 DIAGNOSIS — N184 Chronic kidney disease, stage 4 (severe): Secondary | ICD-10-CM | POA: Diagnosis not present

## 2017-03-18 DIAGNOSIS — I4891 Unspecified atrial fibrillation: Secondary | ICD-10-CM | POA: Diagnosis not present

## 2017-03-21 ENCOUNTER — Encounter (HOSPITAL_COMMUNITY): Payer: Self-pay | Admitting: Emergency Medicine

## 2017-03-21 ENCOUNTER — Emergency Department (HOSPITAL_COMMUNITY): Payer: Medicare HMO

## 2017-03-21 ENCOUNTER — Emergency Department (HOSPITAL_COMMUNITY)
Admission: EM | Admit: 2017-03-21 | Discharge: 2017-03-21 | Disposition: A | Discharge status: Home / Self Care | Payer: Medicare HMO | Attending: Emergency Medicine | Admitting: Emergency Medicine

## 2017-03-21 DIAGNOSIS — D631 Anemia in chronic kidney disease: Secondary | ICD-10-CM | POA: Insufficient documentation

## 2017-03-21 DIAGNOSIS — I4891 Unspecified atrial fibrillation: Secondary | ICD-10-CM | POA: Diagnosis not present

## 2017-03-21 DIAGNOSIS — N183 Chronic kidney disease, stage 3 (moderate): Secondary | ICD-10-CM | POA: Diagnosis not present

## 2017-03-21 DIAGNOSIS — J181 Lobar pneumonia, unspecified organism: Secondary | ICD-10-CM | POA: Diagnosis not present

## 2017-03-21 DIAGNOSIS — R062 Wheezing: Secondary | ICD-10-CM | POA: Diagnosis not present

## 2017-03-21 DIAGNOSIS — J189 Pneumonia, unspecified organism: Secondary | ICD-10-CM

## 2017-03-21 DIAGNOSIS — Z7901 Long term (current) use of anticoagulants: Secondary | ICD-10-CM | POA: Diagnosis not present

## 2017-03-21 DIAGNOSIS — E1122 Type 2 diabetes mellitus with diabetic chronic kidney disease: Secondary | ICD-10-CM | POA: Diagnosis not present

## 2017-03-21 DIAGNOSIS — Z79899 Other long term (current) drug therapy: Secondary | ICD-10-CM | POA: Diagnosis not present

## 2017-03-21 DIAGNOSIS — I5032 Chronic diastolic (congestive) heart failure: Secondary | ICD-10-CM | POA: Insufficient documentation

## 2017-03-21 DIAGNOSIS — I130 Hypertensive heart and chronic kidney disease with heart failure and stage 1 through stage 4 chronic kidney disease, or unspecified chronic kidney disease: Secondary | ICD-10-CM | POA: Diagnosis not present

## 2017-03-21 DIAGNOSIS — R05 Cough: Secondary | ICD-10-CM | POA: Diagnosis not present

## 2017-03-21 LAB — BASIC METABOLIC PANEL
Anion gap: 11 (ref 5–15)
BUN: 35 mg/dL — ABNORMAL HIGH (ref 6–20)
CALCIUM: 8.7 mg/dL — AB (ref 8.9–10.3)
CO2: 21 mmol/L — AB (ref 22–32)
CREATININE: 1.7 mg/dL — AB (ref 0.44–1.00)
Chloride: 106 mmol/L (ref 101–111)
GFR, EST AFRICAN AMERICAN: 31 mL/min — AB (ref >60)
GFR, EST NON AFRICAN AMERICAN: 26 mL/min — AB (ref >60)
GLUCOSE: 178 mg/dL — AB (ref 65–99)
Potassium: 4.7 mmol/L (ref 3.5–5.1)
Sodium: 138 mmol/L (ref 135–145)

## 2017-03-21 LAB — CBC
HCT: 37.6 % (ref 36.0–46.0)
Hemoglobin: 11.6 g/dL — ABNORMAL LOW (ref 12.0–15.0)
MCH: 29.3 pg (ref 26.0–34.0)
MCHC: 30.9 g/dL (ref 30.0–36.0)
MCV: 94.9 fL (ref 78.0–100.0)
PLATELETS: 138 10*3/uL — AB (ref 150–400)
RBC: 3.96 MIL/uL (ref 3.87–5.11)
RDW: 14.3 % (ref 11.5–15.5)
WBC: 5.4 10*3/uL (ref 4.0–10.5)

## 2017-03-21 LAB — TROPONIN I

## 2017-03-21 MED ORDER — ALBUTEROL SULFATE HFA 108 (90 BASE) MCG/ACT IN AERS
2.0000 | INHALATION_SPRAY | Freq: Once | RESPIRATORY_TRACT | Status: AC
  Administered 2017-03-21: 2 via RESPIRATORY_TRACT
  Filled 2017-03-21: qty 6.7

## 2017-03-21 MED ORDER — PREDNISONE 10 MG PO TABS: 10.0000 mg | ORAL_TABLET | Freq: Every day | ORAL | 0 refills | Status: DC

## 2017-03-21 MED ORDER — IPRATROPIUM-ALBUTEROL 0.5-2.5 (3) MG/3ML IN SOLN
3.0000 mL | Freq: Once | RESPIRATORY_TRACT | Status: AC
Start: 2017-03-21 — End: 2017-03-21
  Administered 2017-03-21: 3 mL via RESPIRATORY_TRACT
  Filled 2017-03-21: qty 3

## 2017-03-21 MED ORDER — METHYLPREDNISOLONE SODIUM SUCC 125 MG IJ SOLR
60.0000 mg | Freq: Once | INTRAMUSCULAR | Status: AC
  Administered 2017-03-21: 60 mg via INTRAVENOUS
  Filled 2017-03-21: qty 2

## 2017-03-21 MED ORDER — HYDROCOD POLST-CPM POLST ER 10-8 MG/5ML PO SUER: 5.0000 mL | Freq: Two times a day (BID) | ORAL | 0 refills | Status: DC | PRN

## 2017-03-21 MED ORDER — LEVOFLOXACIN 500 MG PO TABS: 500.0000 mg | ORAL_TABLET | Freq: Every day | ORAL | 0 refills | Status: DC

## 2017-03-21 MED ORDER — SODIUM CHLORIDE 0.9 % IV BOLUS (SEPSIS)
500.0000 mL | Freq: Once | INTRAVENOUS | Status: AC
  Administered 2017-03-21: 500 mL via INTRAVENOUS

## 2017-03-21 NOTE — ED Triage Notes (Signed)
Pt reports shortness of breath with productive cough worsening for 3 weeks.  Has taken 2 different antibiotics and on Friday saw pcp and was started on Levaquin and given a steroid.

## 2017-03-21 NOTE — ED Provider Notes (Signed)
[facility] EMERGENCY DEPARTMENT Provider Note   CSN: [id number] Arrival date & time: 03/21/17  1128     History   Chief Complaint Chief Complaint  Patient presents with  . Shortness of Breath  . Cough    HPI Miranda Garcia is a 83 y.o. female.  Persistent cough and wheezing c yellow sputum for approximately 3 weeks.  Patient has been seen by her primary care doctor on 2 different occasions and prescribed different antibiotics and IM steroids.  She is presently on Levaquin.  No anterior chest pain, fever, sweats, chills, rusty sputum.  Past medical history includes atrial fibrillation.  Severity of symptoms is moderate.  Exertion makes symptoms worse.      Past Medical History:  Diagnosis Date  . Arthritis   . Atrial fibrillation ([place])   . Diabetes mellitus type II, controlled ([place])    x 15 yrs  . Elevated troponin   . Hemorrhoids   . Hypertension     Patient Active Problem List   Diagnosis Date Noted  . Heme positive stool 08/23/2013  . Long term (current) use of anticoagulants 03/03/2012  . Chronic diastolic CHF (congestive heart failure) ([place]) 02/28/2012  . Hypomagnesemia 02/26/2012  . Elevated troponin 02/26/2012  . Congestive heart failure ([place]) 02/26/2012  . Atrial fibrillation ([place]) 02/25/2012  . Chest pain 02/25/2012  . CKD (chronic kidney disease) stage 3, GFR 30-59 ml/min ([facility]) 02/25/2012  . Leukocytosis 02/25/2012  . Anemia due to chronic illness 02/25/2012  . Diabetes ([place]) 02/25/2012    Past Surgical History:  Procedure Laterality Date  . [place]   [facility]  . CATARACT EXTRACTION W/PHACO  01/11/2011   Procedure: CATARACT EXTRACTION PHACO AND INTRAOCULAR LENS PLACEMENT (IOC);  Surgeon: Tonny Branch;  Location: [facility] ORS;  Service: Ophthalmology;  Laterality: Right;  CDE 19.32  . CATARACT EXTRACTION W/PHACO  01/21/2011   Procedure: CATARACT EXTRACTION PHACO AND INTRAOCULAR LENS PLACEMENT (IOC);  Surgeon: Tonny Branch;  Location: [facility] ORS;   Service: Ophthalmology;  Laterality: Left;  CDE: 21.19  . COLONOSCOPY N/A 09/12/2013   Procedure: COLONOSCOPY;  Surgeon: Rogene Houston, MD;  Location: [facility] ENDO SU[facility];  Service: Endoscopy;  Laterality: N/A;  125  . CYSTOSCOPY     [name]  . LITHOTRIPSY     [facility]  . PARTIAL HYSTERECTOMY      OB History    No data available       Home Medications    Prior to Admission medications   Medication Sig Start Date End Date Taking? Authorizing Provider  atorvastatin (LIPITOR) 20 MG tablet TAKE ONE (1) TABLET EACH DAY 6PM 03/14/15   Arnoldo Lenis, MD  calcium citrate-vitamin D (CITRACAL+D) 315-200 MG-UNIT per tablet Take 1 tablet by mouth daily.     [provider]  chlorpheniramine-HYDROcodone (TUSSIONEX PENNKINETIC ER) 10-8 MG/5ML SUER Take 5 mLs by mouth every 12 (twelve) hours as needed for cough. 03/21/17   Nat Christen, MD  diltiazem [facility] CD) 180 MG 24 hr capsule TAKE ONE (1) CAPSULE EACH DAY 06/10/16   Arnoldo Lenis, MD  furosemide (LASIX) 20 MG tablet Take 20 mg by mouth daily as needed.    [provider]  Insulin Aspart Prot & Aspart (NOVOLOG MIX 70/30 [place]) Inject into the skin. 30 in morning and 15 at night.    [provider]  levofloxacin (LEVAQUIN) 500 MG tablet Take 1 tablet (500 mg total) by mouth daily. 03/21/17   Nat Christen, MD  losartan (  COZAAR) 50 MG tablet Take 50 mg by mouth daily.    [provider]  metoprolol (LOPRESSOR) 50 MG tablet Take 50 mg by mouth 2 (two) times daily.      [provider]  Multiple Vitamin (MULTIVITAMIN WITH MINERALS) TABS Take 1 tablet by mouth daily.    [provider]  Multiple Vitamins-Minerals (PRESERVISION AREDS) TABS Take 1 tablet by mouth 2 (two) times daily.    [provider]  potassium chloride (K-DUR) 10 MEQ tablet Take 1 tablet by mouth daily as needed. 02/20/16   [provider]  predniSONE (DELTASONE) 10 MG tablet Take 1 tablet (10 mg total) by mouth  daily with breakfast. 2 tablets daily for 4 days; 1 tablet daily for 4 days 03/21/17   Nat Christen, MD  vitamin E 400 UNIT capsule Take 400 Units by mouth daily.    [provider]  warfarin (COUMADIN) 5 MG tablet Take 1 tablet (5 mg total) by mouth daily. Or as directed 03/07/12   Larey Dresser, MD    Family History Family History  Problem Relation Age of Onset  . Colon cancer Brother     Social History Social History   Tobacco Use  . Smoking status: Never Smoker  . Smokeless tobacco: Never Used  Substance Use Topics  . Alcohol use: No    Alcohol/week: 0.0 oz  . Drug use: No     Allergies   Penicillins   Review of Systems Review of Systems  All other systems reviewed and are negative.    Physical Exam Updated Vital Signs BP (!) 142/80   Pulse 85   Temp 98.7 F (37.1 C) (Oral)   Resp (!) 22   Ht 5\' 3"  (1.6 m)   Wt 92.5 kg (204 lb)   SpO2 95%   BMI 36.14 kg/m   Physical Exam  Constitutional: She is oriented to person, place, and time. She appears well-developed and well-nourished.  nad  HENT:  Head: Normocephalic and atraumatic.  Eyes: Conjunctivae are normal.  Neck: Neck supple.  Cardiovascular: Normal rate and regular rhythm.  Pulmonary/Chest: Effort normal.  Bilateral expiratory wheezes  Abdominal: Soft. Bowel sounds are normal.  Musculoskeletal: Normal range of motion.  Neurological: She is alert and oriented to person, place, and time.  Skin: Skin is warm and dry.  Psychiatric: She has a normal mood and affect. Her behavior is normal.  Nursing note and vitals reviewed.    ED Treatments / Results  Labs (all labs ordered are listed, but only abnormal results are displayed) Labs Reviewed  BASIC METABOLIC PANEL - Abnormal; Notable for the following components:      Result Value   CO2 21 (*)    Glucose, Bld 178 (*)    BUN 35 (*)    Creatinine, Ser 1.70 (*)    Calcium 8.7 (*)    GFR calc non Af Amer 26 (*)    GFR calc Af Amer 31  (*)    All other components within normal limits  CBC - Abnormal; Notable for the following components:   Hemoglobin 11.6 (*)    Platelets 138 (*)    All other components within normal limits  TROPONIN I    EKG  EKG Interpretation  Date/Time:  Monday March 21 2017 12:11:13 EST Ventricular Rate:  105 PR Interval:    QRS Duration: 94 QT Interval:  296 QTC Calculation: 391 R Axis:   -36 Text Interpretation:  Atrial fibrillation with rapid ventricular response  Left axis deviation Left ventricular hypertrophy Abnormal ECG Confirmed by Nat Christen [phone]) on 03/21/2017 1:39:56 PM       Radiology Dg Chest 2 View  Result Date: 03/21/2017 CLINICAL DATA:  Productive cough times several weeks. EXAM: CHEST  2 VIEW COMPARISON:  03/18/2017 FINDINGS: Stable cardiomegaly with aortic atherosclerosis at the arch. Peribronchial thickening with subtle airspace opacities are noted at the lung bases, more so on the right. Findings are suspicious for bronchitic change and potentially pneumonia. No effusion or pneumothorax. No acute osseous abnormality. IMPRESSION: 1. Peribronchial thickening especially at the right lung base with subtle airspace opacities suspicious for bronchitic change and potentially superimposed pneumonia. 2. Stable cardiomegaly and aortic atherosclerosis. Electronically Signed   By: Ashley Royalty M.D.   On: 03/21/2017 13:44    Procedures Procedures (including critical care time)  Medications Ordered in ED Medications  albuterol (PROVENTIL HFA;VENTOLIN HFA) 108 (90 Base) MCG/ACT inhaler 2 puff (not administered)  sodium chloride 0.9 % bolus 500 mL (500 mLs Intravenous New Bag/Given 03/21/17 1315)  ipratropium-albuterol (DUONEB) 0.5-2.5 (3) MG/3ML nebulizer solution 3 mL (3 mLs Nebulization Given 03/21/17 1300)  methylPREDNISolone sodium succinate (SOLU-MEDROL) 125 mg/2 mL injection 60 mg (60 mg Intravenous Given 03/21/17 1435)     Initial Impression / Assessment and Plan /  ED Course  I have reviewed the triage vital signs and the nursing notes.  Pertinent labs & imaging results that were available during my care of the patient were reviewed by me and considered in my medical decision making (see chart for details).     Patient is hemodynamically stable.  Chest x-ray suggests an early pneumonia in the right lung base.  Patient was given a nebulizer treatment and IV steroids with IV fluids.  She feels much better after the treatment.  We will continue the Levaquin as an outpatient.  Will also Rx albuterol inhaler, prednisone, Tussionex.  Final Clinical Impressions(s) / ED Diagnoses   Final diagnoses:  Community acquired pneumonia of right lower lobe of lung [facility])    ED Discharge Orders        Ordered    predniSONE (DELTASONE) 10 MG tablet  Daily with breakfast     03/21/17 1441    levofloxacin (LEVAQUIN) 500 MG tablet  Daily     03/21/17 1441    chlorpheniramine-HYDROcodone (TUSSIONEX PENNKINETIC ER) 10-8 MG/5ML SUER  Every 12 hours PRN     03/21/17 1441       Nat Christen, MD 03/21/17 1455

## 2017-03-21 NOTE — Discharge Instructions (Signed)
Chest x-ray showed a small amount of pneumonia in your right lung.  Continue your antibiotic.  Prescription for inhaler, cough syrup, prednisone, continuation of the antibiotic.  Follow-up your primary care doctor.

## 2017-03-24 DIAGNOSIS — Z713 Dietary counseling and surveillance: Secondary | ICD-10-CM | POA: Diagnosis not present

## 2017-03-24 DIAGNOSIS — J189 Pneumonia, unspecified organism: Secondary | ICD-10-CM | POA: Diagnosis not present

## 2017-03-24 DIAGNOSIS — Z299 Encounter for prophylactic measures, unspecified: Secondary | ICD-10-CM | POA: Diagnosis not present

## 2017-03-24 DIAGNOSIS — Z6837 Body mass index (BMI) 37.0-37.9, adult: Secondary | ICD-10-CM | POA: Diagnosis not present

## 2017-04-01 DIAGNOSIS — Z6836 Body mass index (BMI) 36.0-36.9, adult: Secondary | ICD-10-CM | POA: Diagnosis not present

## 2017-04-01 DIAGNOSIS — I4891 Unspecified atrial fibrillation: Secondary | ICD-10-CM | POA: Diagnosis not present

## 2017-04-01 DIAGNOSIS — N184 Chronic kidney disease, stage 4 (severe): Secondary | ICD-10-CM | POA: Diagnosis not present

## 2017-04-01 DIAGNOSIS — J209 Acute bronchitis, unspecified: Secondary | ICD-10-CM | POA: Diagnosis not present

## 2017-04-01 DIAGNOSIS — E1122 Type 2 diabetes mellitus with diabetic chronic kidney disease: Secondary | ICD-10-CM | POA: Diagnosis not present

## 2017-04-01 DIAGNOSIS — Z299 Encounter for prophylactic measures, unspecified: Secondary | ICD-10-CM | POA: Diagnosis not present

## 2017-04-05 DIAGNOSIS — I10 Essential (primary) hypertension: Secondary | ICD-10-CM | POA: Diagnosis not present

## 2017-04-05 DIAGNOSIS — E119 Type 2 diabetes mellitus without complications: Secondary | ICD-10-CM | POA: Diagnosis not present

## 2017-04-05 DIAGNOSIS — I4891 Unspecified atrial fibrillation: Secondary | ICD-10-CM | POA: Diagnosis not present

## 2017-04-07 ENCOUNTER — Ambulatory Visit: Payer: Medicare HMO | Admitting: Cardiology

## 2017-04-18 DIAGNOSIS — Z6836 Body mass index (BMI) 36.0-36.9, adult: Secondary | ICD-10-CM | POA: Diagnosis not present

## 2017-04-18 DIAGNOSIS — Z299 Encounter for prophylactic measures, unspecified: Secondary | ICD-10-CM | POA: Diagnosis not present

## 2017-04-18 DIAGNOSIS — Z713 Dietary counseling and surveillance: Secondary | ICD-10-CM | POA: Diagnosis not present

## 2017-04-18 DIAGNOSIS — I4891 Unspecified atrial fibrillation: Secondary | ICD-10-CM | POA: Diagnosis not present

## 2017-04-18 DIAGNOSIS — J189 Pneumonia, unspecified organism: Secondary | ICD-10-CM | POA: Diagnosis not present

## 2017-05-05 DIAGNOSIS — R0602 Shortness of breath: Secondary | ICD-10-CM | POA: Diagnosis not present

## 2017-05-05 DIAGNOSIS — R05 Cough: Secondary | ICD-10-CM | POA: Diagnosis not present

## 2017-05-09 DIAGNOSIS — I10 Essential (primary) hypertension: Secondary | ICD-10-CM | POA: Diagnosis not present

## 2017-05-09 DIAGNOSIS — I4891 Unspecified atrial fibrillation: Secondary | ICD-10-CM | POA: Diagnosis not present

## 2017-05-09 DIAGNOSIS — E119 Type 2 diabetes mellitus without complications: Secondary | ICD-10-CM | POA: Diagnosis not present

## 2017-05-10 ENCOUNTER — Encounter: Payer: Self-pay | Admitting: Cardiology

## 2017-05-10 ENCOUNTER — Encounter: Payer: Self-pay | Admitting: *Deleted

## 2017-05-10 ENCOUNTER — Ambulatory Visit: Payer: Medicare HMO | Admitting: Cardiology

## 2017-05-10 ENCOUNTER — Other Ambulatory Visit: Payer: Self-pay

## 2017-05-10 VITALS — BP 178/90 | HR 64 | Ht 63.0 in | Wt 204.0 lb

## 2017-05-10 DIAGNOSIS — E782 Mixed hyperlipidemia: Secondary | ICD-10-CM

## 2017-05-10 DIAGNOSIS — I5032 Chronic diastolic (congestive) heart failure: Secondary | ICD-10-CM | POA: Diagnosis not present

## 2017-05-10 DIAGNOSIS — E1122 Type 2 diabetes mellitus with diabetic chronic kidney disease: Secondary | ICD-10-CM | POA: Diagnosis not present

## 2017-05-10 DIAGNOSIS — N184 Chronic kidney disease, stage 4 (severe): Secondary | ICD-10-CM | POA: Diagnosis not present

## 2017-05-10 DIAGNOSIS — Z6837 Body mass index (BMI) 37.0-37.9, adult: Secondary | ICD-10-CM | POA: Diagnosis not present

## 2017-05-10 DIAGNOSIS — E7800 Pure hypercholesterolemia, unspecified: Secondary | ICD-10-CM | POA: Diagnosis not present

## 2017-05-10 DIAGNOSIS — I10 Essential (primary) hypertension: Secondary | ICD-10-CM | POA: Diagnosis not present

## 2017-05-10 DIAGNOSIS — I4891 Unspecified atrial fibrillation: Secondary | ICD-10-CM | POA: Diagnosis not present

## 2017-05-10 DIAGNOSIS — Z299 Encounter for prophylactic measures, unspecified: Secondary | ICD-10-CM | POA: Diagnosis not present

## 2017-05-10 NOTE — Progress Notes (Signed)
Clinical Summary Miranda Garcia is a 84 y.o.female  seen today for follow up of the following medical problems.   1. Chronic diastolic HF - working to limit diuretic due to her renal dysfunction  - baseline SOB is stable. No recent edema. Has not required prn lasix   2. Afib -she has not had interest in NOACs  - just occasional palpitations.  - no bleeding on anticoag.   3. HTN  - home bp's typically around 130-140s/70s. Earlier today at pcp 130s/70s  4. Hyperlipidemia - 07/2015: TC 114 TG 104 HDL 56 LDL 37 - she is complaint w meds  SH: spends free time sewing, baking.   Past Medical History:  Diagnosis Date  . Arthritis   . Atrial fibrillation ([place])   . Diabetes mellitus type II, controlled ([place])    x 15 yrs  . Elevated troponin   . Hemorrhoids   . Hypertension      Allergies  Allergen Reactions  . Penicillins Rash     Current Outpatient Medications  Medication Sig Dispense Refill  . atorvastatin (LIPITOR) 20 MG tablet TAKE ONE (1) TABLET EACH DAY 6PM 30 tablet 6  . calcium citrate-vitamin D (CITRACAL+D) 315-200 MG-UNIT per tablet Take 1 tablet by mouth daily.     . chlorpheniramine-HYDROcodone (TUSSIONEX PENNKINETIC ER) 10-8 MG/5ML SUER Take 5 mLs by mouth every 12 (twelve) hours as needed for cough. 140 mL 0  . diltiazem (CARDIZEM CD) 180 MG 24 hr capsule TAKE ONE (1) CAPSULE EACH DAY 90 capsule 3  . furosemide (LASIX) 20 MG tablet Take 20 mg by mouth daily as needed.    . Insulin Aspart Prot & Aspart (NOVOLOG MIX 70/30 [place]) Inject into the skin. 30 in morning and 15 at night.    Marland Kitchen levofloxacin (LEVAQUIN) 500 MG tablet Take 1 tablet (500 mg total) by mouth daily. 7 tablet 0  . losartan (COZAAR) 50 MG tablet Take 50 mg by mouth daily.    . metoprolol (LOPRESSOR) 50 MG tablet Take 50 mg by mouth 2 (two) times daily.      . Multiple Vitamin (MULTIVITAMIN WITH MINERALS) TABS Take 1 tablet by mouth daily.    . Multiple Vitamins-Minerals (PRESERVISION  AREDS) TABS Take 1 tablet by mouth 2 (two) times daily.    . potassium chloride (K-DUR) 10 MEQ tablet Take 1 tablet by mouth daily as needed.    . predniSONE (DELTASONE) 10 MG tablet Take 1 tablet (10 mg total) by mouth daily with breakfast. 2 tablets daily for 4 days; 1 tablet daily for 4 days 12 tablet 0  . vitamin E 400 UNIT capsule Take 400 Units by mouth daily.    Marland Kitchen warfarin (COUMADIN) 5 MG tablet Take 1 tablet (5 mg total) by mouth daily. Or as directed 45 tablet 3   No current facility-administered medications for this visit.    Facility-Administered Medications Ordered in Other Visits  Medication Dose Route Frequency Provider Last Rate Last Dose  . neomycin-polymyxin-dexameth (MAXITROL) 0.1 % ophth ointment    PRN Tonny Branch, MD   1 application at 41/93/79 1427     Past Surgical History:  Procedure Laterality Date  . [place]   [facility]  . CATARACT EXTRACTION W/PHACO  01/11/2011   Procedure: CATARACT EXTRACTION PHACO AND INTRAOCULAR LENS PLACEMENT (IOC);  Surgeon: Tonny Branch;  Location: [facility] ORS;  Service: Ophthalmology;  Laterality: Right;  CDE 19.32  . CATARACT EXTRACTION W/PHACO  01/21/2011   Procedure: CATARACT EXTRACTION  PHACO AND INTRAOCULAR LENS PLACEMENT (IOC);  Surgeon: Tonny Branch;  Location: [facility] ORS;  Service: Ophthalmology;  Laterality: Left;  CDE: 21.19  . COLONOSCOPY N/A 09/12/2013   Procedure: COLONOSCOPY;  Surgeon: Rogene Houston, MD;  Location: [facility] END[facility] SUITE;  Service: Endoscopy;  Laterality: N/A;  125  . CYSTOSCOPY     [name]  . LITHOTRIPSY     [facility]  . PARTIAL HYSTERECTOMY       Allergies  Allergen Reactions  . Penicillins Rash      Family History  Problem Relation Age of Onset  . Colon cancer Brother      Social History Miranda Garcia reports that  has never smoked. she has never used smokeless tobacco. Miranda Garcia reports that she does not drink alcohol.   Review of Systems CONSTITUTIONAL: No weight loss, fever, chills, weakness or  fatigue.  HEENT: Eyes: No visual loss, blurred vision, double vision or yellow sclerae.No hearing loss, sneezing, congestion, runny nose or sore throat.  SKIN: No rash or itching.  CARDIOVASCULAR: per hpi RESPIRATORY: No shortness of breath, cough or sputum.  GASTROINTESTINAL: No anorexia, nausea, vomiting or diarrhea. No abdominal pain or blood.  GENITOURINARY: No burning on urination, no polyuria NEUROLOGICAL: No headache, dizziness, syncope, paralysis, ataxia, numbness or tingling in the extremities. No change in bowel or bladder control.  MUSCULOSKELETAL: No muscle, back pain, joint pain or stiffness.  LYMPHATICS: No enlarged nodes. No history of splenectomy.  PSYCHIATRIC: No history of depression or anxiety.  ENDOCRINOLOGIC: No reports of sweating, cold or heat intolerance. No polyuria or polydipsia.  Marland Kitchen   Physical Examination  Gen: resting comfortably, no acute distress HEENT: no scleral icterus, pupils equal round and reactive, no palptable cervical adenopathy,  CV: RRR, 2/6 systolic murmur rusb, bilateral carotid bruits Resp: Clear to auscultation bilaterally GI: abdomen is soft, non-tender, non-distended, normal bowel sounds, no hepatosplenomegaly MSK: extremities are warm, no edema.  Skin: warm, no rash Neuro:  no focal deficits Psych: appropriate affect   Diagnostic Studies  02/2012 Echo: LVEF 60-65%, mild LVH, no WMAs, grade II diastolic dysfunction, mode LAE.  02/2012 MPI:  Abnormal Lexiscan Myoview with no diagnostic electrocardiographic changes. The scintigraphic results show breast attenuation. There is also mild ischemia in the mid and basal inferolateral wall. The gated ejection fraction was 80% and the wall motion was normal.  10/2016 carotid US 1-39% bilateral disease  10/2016 echo ------------------------------------------------------------------- Study Conclusions  - Left ventricle: The cavity size was normal. Wall thickness was   increased in a  pattern of mild LVH. Systolic function was   vigorous. The estimated ejection fraction was in the range of 65%   to 70%. Wall motion was normal; there were no regional wall   motion abnormalities. Doppler parameters are consistent with   abnormal left ventricular relaxation (grade 1 diastolic   dysfunction). Doppler parameters are consistent with high   ventricular filling pressure. - Aortic valve: Mildly calcified annulus. Trileaflet; mildly   thickened leaflets. Valve area (VTI): 1.56 cm^2. Valve area   (Vmax): 1.7 cm^2. - Mitral valve: Mildly calcified annulus. Mildly thickened leaflets   . - Left atrium: The atrium was moderately dilated. - Technically adequate study.   Assessment and Plan   1. Chronic diastolic HF  - no symptoms, euvolemic. Continue current meds  2. Afib  - overall doing well, limited palpitations. Continue current meds - CHADS2VASC score is 4,  continue coumadin.   3. HTN  - elevated here, but home numbers and at  recent pcp visit at goal. Continue current meds  4. Hyperlipidemia  - has been at goal, request labs from pcp - continue statin       Arnoldo Lenis, M.D.

## 2017-05-10 NOTE — Patient Instructions (Signed)

## 2017-05-11 ENCOUNTER — Encounter: Payer: Self-pay | Admitting: Cardiology

## 2017-05-13 DIAGNOSIS — R69 Illness, unspecified: Secondary | ICD-10-CM | POA: Diagnosis not present

## 2017-05-21 DIAGNOSIS — R69 Illness, unspecified: Secondary | ICD-10-CM | POA: Diagnosis not present

## 2017-06-01 DIAGNOSIS — Z299 Encounter for prophylactic measures, unspecified: Secondary | ICD-10-CM | POA: Diagnosis not present

## 2017-06-01 DIAGNOSIS — E1165 Type 2 diabetes mellitus with hyperglycemia: Secondary | ICD-10-CM | POA: Diagnosis not present

## 2017-06-01 DIAGNOSIS — N184 Chronic kidney disease, stage 4 (severe): Secondary | ICD-10-CM | POA: Diagnosis not present

## 2017-06-01 DIAGNOSIS — I4891 Unspecified atrial fibrillation: Secondary | ICD-10-CM | POA: Diagnosis not present

## 2017-06-01 DIAGNOSIS — E1122 Type 2 diabetes mellitus with diabetic chronic kidney disease: Secondary | ICD-10-CM | POA: Diagnosis not present

## 2017-06-01 DIAGNOSIS — Z6837 Body mass index (BMI) 37.0-37.9, adult: Secondary | ICD-10-CM | POA: Diagnosis not present

## 2017-06-08 ENCOUNTER — Other Ambulatory Visit: Payer: Self-pay | Admitting: Cardiology

## 2017-06-17 DIAGNOSIS — Z299 Encounter for prophylactic measures, unspecified: Secondary | ICD-10-CM | POA: Diagnosis not present

## 2017-06-17 DIAGNOSIS — N184 Chronic kidney disease, stage 4 (severe): Secondary | ICD-10-CM | POA: Diagnosis not present

## 2017-06-17 DIAGNOSIS — Z6837 Body mass index (BMI) 37.0-37.9, adult: Secondary | ICD-10-CM | POA: Diagnosis not present

## 2017-06-17 DIAGNOSIS — E1165 Type 2 diabetes mellitus with hyperglycemia: Secondary | ICD-10-CM | POA: Diagnosis not present

## 2017-06-17 DIAGNOSIS — I10 Essential (primary) hypertension: Secondary | ICD-10-CM | POA: Diagnosis not present

## 2017-06-17 DIAGNOSIS — I4891 Unspecified atrial fibrillation: Secondary | ICD-10-CM | POA: Diagnosis not present

## 2017-06-17 DIAGNOSIS — E1122 Type 2 diabetes mellitus with diabetic chronic kidney disease: Secondary | ICD-10-CM | POA: Diagnosis not present

## 2017-06-30 DIAGNOSIS — Z6837 Body mass index (BMI) 37.0-37.9, adult: Secondary | ICD-10-CM | POA: Diagnosis not present

## 2017-06-30 DIAGNOSIS — Z299 Encounter for prophylactic measures, unspecified: Secondary | ICD-10-CM | POA: Diagnosis not present

## 2017-06-30 DIAGNOSIS — Z713 Dietary counseling and surveillance: Secondary | ICD-10-CM | POA: Diagnosis not present

## 2017-06-30 DIAGNOSIS — I4891 Unspecified atrial fibrillation: Secondary | ICD-10-CM | POA: Diagnosis not present

## 2017-07-15 DIAGNOSIS — E119 Type 2 diabetes mellitus without complications: Secondary | ICD-10-CM | POA: Diagnosis not present

## 2017-07-15 DIAGNOSIS — I4891 Unspecified atrial fibrillation: Secondary | ICD-10-CM | POA: Diagnosis not present

## 2017-07-15 DIAGNOSIS — I10 Essential (primary) hypertension: Secondary | ICD-10-CM | POA: Diagnosis not present

## 2017-08-05 DIAGNOSIS — R69 Illness, unspecified: Secondary | ICD-10-CM | POA: Diagnosis not present

## 2017-08-08 ENCOUNTER — Encounter: Payer: Self-pay | Admitting: Cardiology

## 2017-08-08 DIAGNOSIS — Z79899 Other long term (current) drug therapy: Secondary | ICD-10-CM | POA: Diagnosis not present

## 2017-08-08 DIAGNOSIS — Z1331 Encounter for screening for depression: Secondary | ICD-10-CM | POA: Diagnosis not present

## 2017-08-08 DIAGNOSIS — E559 Vitamin D deficiency, unspecified: Secondary | ICD-10-CM | POA: Diagnosis not present

## 2017-08-08 DIAGNOSIS — I4891 Unspecified atrial fibrillation: Secondary | ICD-10-CM | POA: Diagnosis not present

## 2017-08-08 DIAGNOSIS — E7800 Pure hypercholesterolemia, unspecified: Secondary | ICD-10-CM | POA: Diagnosis not present

## 2017-08-08 DIAGNOSIS — Z1339 Encounter for screening examination for other mental health and behavioral disorders: Secondary | ICD-10-CM | POA: Diagnosis not present

## 2017-08-08 DIAGNOSIS — Z7189 Other specified counseling: Secondary | ICD-10-CM | POA: Diagnosis not present

## 2017-08-08 DIAGNOSIS — Z1211 Encounter for screening for malignant neoplasm of colon: Secondary | ICD-10-CM | POA: Diagnosis not present

## 2017-08-08 DIAGNOSIS — R5383 Other fatigue: Secondary | ICD-10-CM | POA: Diagnosis not present

## 2017-08-08 DIAGNOSIS — Z6838 Body mass index (BMI) 38.0-38.9, adult: Secondary | ICD-10-CM | POA: Diagnosis not present

## 2017-08-08 DIAGNOSIS — Z0000 Encounter for general adult medical examination without abnormal findings: Secondary | ICD-10-CM | POA: Diagnosis not present

## 2017-08-09 DIAGNOSIS — I10 Essential (primary) hypertension: Secondary | ICD-10-CM | POA: Diagnosis not present

## 2017-08-09 DIAGNOSIS — E119 Type 2 diabetes mellitus without complications: Secondary | ICD-10-CM | POA: Diagnosis not present

## 2017-08-09 DIAGNOSIS — I4891 Unspecified atrial fibrillation: Secondary | ICD-10-CM | POA: Diagnosis not present

## 2017-08-10 DIAGNOSIS — Z0100 Encounter for examination of eyes and vision without abnormal findings: Secondary | ICD-10-CM | POA: Diagnosis not present

## 2017-08-10 DIAGNOSIS — I10 Essential (primary) hypertension: Secondary | ICD-10-CM | POA: Diagnosis not present

## 2017-08-10 DIAGNOSIS — E109 Type 1 diabetes mellitus without complications: Secondary | ICD-10-CM | POA: Diagnosis not present

## 2017-08-14 DIAGNOSIS — L400 Psoriasis vulgaris: Secondary | ICD-10-CM | POA: Diagnosis not present

## 2017-08-16 DIAGNOSIS — R69 Illness, unspecified: Secondary | ICD-10-CM | POA: Diagnosis not present

## 2017-08-23 DIAGNOSIS — R69 Illness, unspecified: Secondary | ICD-10-CM | POA: Diagnosis not present

## 2017-09-05 DIAGNOSIS — Z6838 Body mass index (BMI) 38.0-38.9, adult: Secondary | ICD-10-CM | POA: Diagnosis not present

## 2017-09-05 DIAGNOSIS — Z299 Encounter for prophylactic measures, unspecified: Secondary | ICD-10-CM | POA: Diagnosis not present

## 2017-09-05 DIAGNOSIS — E875 Hyperkalemia: Secondary | ICD-10-CM | POA: Diagnosis not present

## 2017-09-05 DIAGNOSIS — N183 Chronic kidney disease, stage 3 (moderate): Secondary | ICD-10-CM | POA: Diagnosis not present

## 2017-09-05 DIAGNOSIS — I4891 Unspecified atrial fibrillation: Secondary | ICD-10-CM | POA: Diagnosis not present

## 2017-09-09 ENCOUNTER — Telehealth: Payer: Self-pay | Admitting: Cardiology

## 2017-09-09 NOTE — Telephone Encounter (Signed)
Patient called stating that she has been feeling bad for several weeks now.  States that she had physical 3 weeks ago with Dr. Woody Seller.  Her Potassium was too high. States that her heart has been fluttering. Dr. Woody Seller has made changes to her medications.  Please call [phone].

## 2017-09-12 ENCOUNTER — Encounter: Payer: Self-pay | Admitting: *Deleted

## 2017-09-14 NOTE — Telephone Encounter (Signed)
Please let me know once we have been able to contact her. Can we get a full history of any symptoms she has been having.   Zandra Abts MD

## 2017-09-14 NOTE — Telephone Encounter (Signed)
Received labs - potassium 5.5 - no answer no VM when tried to reach pt - will f/u and forward to provider as well

## 2017-09-15 NOTE — Telephone Encounter (Signed)
For the last several weeks c/o weakness/SOB - says HR increases when she gets up and has to lay back down - also having palpitations - says she gives out of breath just walking around her home - Dr Woody Seller had her stop Valsartan and will repeat labs on this coming Tuesday - pt has f/u with Dr Harl Bowie in September

## 2017-09-15 NOTE — Telephone Encounter (Signed)
Patient is returning telephone call to [facility]. Will forward

## 2017-09-15 NOTE — Telephone Encounter (Signed)
LM on VM and also on daughters Miranda Garcia) VM to return call

## 2017-09-16 DIAGNOSIS — L401 Generalized pustular psoriasis: Secondary | ICD-10-CM | POA: Diagnosis not present

## 2017-09-16 NOTE — Telephone Encounter (Signed)
I can see her Wed at [place]

## 2017-09-16 NOTE — Telephone Encounter (Signed)
Added to schedule.

## 2017-09-16 NOTE — Telephone Encounter (Signed)
Pt aware and agreeable.  

## 2017-09-20 DIAGNOSIS — Z6838 Body mass index (BMI) 38.0-38.9, adult: Secondary | ICD-10-CM | POA: Diagnosis not present

## 2017-09-20 DIAGNOSIS — Z299 Encounter for prophylactic measures, unspecified: Secondary | ICD-10-CM | POA: Diagnosis not present

## 2017-09-20 DIAGNOSIS — I4891 Unspecified atrial fibrillation: Secondary | ICD-10-CM | POA: Diagnosis not present

## 2017-09-20 DIAGNOSIS — E875 Hyperkalemia: Secondary | ICD-10-CM | POA: Diagnosis not present

## 2017-09-20 DIAGNOSIS — I10 Essential (primary) hypertension: Secondary | ICD-10-CM | POA: Diagnosis not present

## 2017-09-20 DIAGNOSIS — Z79899 Other long term (current) drug therapy: Secondary | ICD-10-CM | POA: Diagnosis not present

## 2017-09-21 ENCOUNTER — Other Ambulatory Visit: Payer: Self-pay

## 2017-09-21 ENCOUNTER — Encounter: Payer: Self-pay | Admitting: *Deleted

## 2017-09-21 ENCOUNTER — Ambulatory Visit (INDEPENDENT_AMBULATORY_CARE_PROVIDER_SITE_OTHER): Payer: Medicare HMO | Admitting: Cardiology

## 2017-09-21 ENCOUNTER — Encounter: Payer: Self-pay | Admitting: Cardiology

## 2017-09-21 VITALS — BP 142/70 | HR 83 | Ht 63.0 in | Wt 209.0 lb

## 2017-09-21 DIAGNOSIS — I5032 Chronic diastolic (congestive) heart failure: Secondary | ICD-10-CM | POA: Diagnosis not present

## 2017-09-21 DIAGNOSIS — E782 Mixed hyperlipidemia: Secondary | ICD-10-CM

## 2017-09-21 DIAGNOSIS — I10 Essential (primary) hypertension: Secondary | ICD-10-CM

## 2017-09-21 DIAGNOSIS — I4891 Unspecified atrial fibrillation: Secondary | ICD-10-CM

## 2017-09-21 NOTE — Progress Notes (Signed)
Yes

## 2017-09-21 NOTE — Progress Notes (Signed)
Clinical Summary Miranda Garcia is a 84 y.o.female seen today for follow up of the following medical problems.  1. Chronic diastolic HF - working to limit diuretic due to her renal dysfunction   - some recent SOB, has had some LE edema, abdominal distension.  - home weights around 208    2. Afib  - recent palpitations for 4-5 weeks - symptoms of palpitations, fatigue. SOB. - palpiations with activities. PCP increased lopressot to 75mg  bid recently      3. HTN - compliant with meds   4. Hyperlipidemia - compliant with meds  5.Hyperklaemia - recent elevated K, pcp had her stop her valsartan     SH: spends free time sewing, baking.    Past Medical History:  Diagnosis Date  . Arthritis   . Atrial fibrillation ([place])   . Diabetes mellitus type II, controlled ([place])    x 15 yrs  . Elevated troponin   . Hemorrhoids   . Hypertension      Allergies  Allergen Reactions  . Penicillins Rash     Current Outpatient Medications  Medication Sig Dispense Refill  . atorvastatin (LIPITOR) 20 MG tablet TAKE ONE (1) TABLET EACH DAY 6PM 30 tablet 6  . calcium citrate-vitamin D (CITRACAL+D) 315-200 MG-UNIT per tablet Take 1 tablet by mouth daily.     . chlorpheniramine-HYDROcodone (TUSSIONEX PENNKINETIC ER) 10-8 MG/5ML SUER Take 5 mLs by mouth every 12 (twelve) hours as needed for cough. 140 mL 0  . diltiazem (CARDIZEM CD) 180 MG 24 hr capsule TAKE ONE (1) CAPSULE EACH DAY 90 capsule 1  . furosemide (LASIX) 20 MG tablet Take 20 mg by mouth daily as needed.    . Insulin Aspart Prot & Aspart (NOVOLOG MIX 70/30 [place]) Inject into the skin. 30 in morning and 15 at night.    Marland Kitchen levofloxacin (LEVAQUIN) 500 MG tablet Take 1 tablet (500 mg total) by mouth daily. 7 tablet 0  . metoprolol (LOPRESSOR) 50 MG tablet Take 50 mg by mouth 2 (two) times daily.      . Multiple Vitamin (MULTIVITAMIN WITH MINERALS) TABS Take 1 tablet by mouth daily.    . Multiple Vitamins-Minerals  (PRESERVISION AREDS) TABS Take 1 tablet by mouth 2 (two) times daily.    . potassium chloride (K-DUR) 10 MEQ tablet Take 1 tablet by mouth daily as needed.    . predniSONE (DELTASONE) 10 MG tablet Take 1 tablet (10 mg total) by mouth daily with breakfast. 2 tablets daily for 4 days; 1 tablet daily for 4 days 12 tablet 0  . valsartan (DIOVAN) 160 MG tablet     . vitamin E 400 UNIT capsule Take 400 Units by mouth daily.    Marland Kitchen warfarin (COUMADIN) 5 MG tablet Take 1 tablet (5 mg total) by mouth daily. Or as directed 45 tablet 3   No current facility-administered medications for this visit.    Facility-Administered Medications Ordered in Other Visits  Medication Dose Route Frequency Provider Last Rate Last Dose  . neomycin-polymyxin-dexameth (MAXITROL) 0.1 % ophth ointment    PRN Tonny Bradely Rudin, MD   1 application at 19/62/22 1427     Past Surgical History:  Procedure Laterality Date  . [place]   [facility]  . CATARACT EXTRACTION W/PHACO  01/11/2011   Procedure: CATARACT EXTRACTION PHACO AND INTRAOCULAR LENS PLACEMENT (IOC);  Surgeon: Tonny Treshon Stannard;  Location: [facility];  Service: Ophthalmology;  Laterality: Right;  CDE 19.32  . CATARACT EXTRACTION W/PHACO  01/21/2011  Procedure: CATARACT EXTRACTION PHACO AND INTRAOCULAR LENS PLACEMENT (IOC);  Surgeon: Tonny Chantilly Linskey;  Location: [facility];  Service: Ophthalmology;  Laterality: Left;  CDE: 21.19  . COLONOSCOPY N/A 09/12/2013   Procedure: COLONOSCOPY;  Surgeon: Rogene Houston, MD;  Location: [facility];  Service: Endoscopy;  Laterality: N/A;  125  . CYSTOSCOPY     [name]  . LITHOTRIPSY     [facility]  . PARTIAL HYSTERECTOMY       Allergies  Allergen Reactions  . Penicillins Rash      Family History  Problem Relation Age of Onset  . Colon cancer Brother      Social History Miranda Garcia reports that she has never smoked. She has never used smokeless tobacco. Miranda Garcia reports that she does not drink alcohol.   Review of  Systems CONSTITUTIONAL: No weight loss, fever, chills, weakness or fatigue.  HEENT: Eyes: No visual loss, blurred vision, double vision or yellow sclerae.No hearing loss, sneezing, congestion, runny nose or sore throat.  SKIN: No rash or itching.  CARDIOVASCULAR: per hpi RESPIRATORY: No shortness of breath, cough or sputum.  GASTROINTESTINAL: No anorexia, nausea, vomiting or diarrhea. No abdominal pain or blood.  GENITOURINARY: No burning on urination, no polyuria NEUROLOGICAL: No headache, dizziness, syncope, paralysis, ataxia, numbness or tingling in the extremities. No change in bowel or bladder control.  MUSCULOSKELETAL: No muscle, back pain, joint pain or stiffness.  LYMPHATICS: No enlarged nodes. No history of splenectomy.  PSYCHIATRIC: No history of depression or anxiety.  ENDOCRINOLOGIC: No reports of sweating, cold or heat intolerance. No polyuria or polydipsia.  Marland Kitchen   Physical Examination Vitals:   09/21/17 1041  BP: (!) 142/70  Pulse: 83  SpO2: 97%   Vitals:   09/21/17 1041  Weight: 209 lb (94.8 kg)  Height: 5\' 3"  (1.6 m)    Gen: resting comfortably, no acute distress HEENT: no scleral icterus, pupils equal round and reactive, no palptable cervical adenopathy,  CV: RRR, no m/rg, no jvd Resp: Clear to auscultation bilaterally GI: abdomen is soft, non-tender, non-distended, normal bowel sounds, no hepatosplenomegaly MSK: extremities are warm, no edema.  Skin: warm, no rash Neuro:  no focal deficits Psych: appropriate affect   Diagnostic Studies 02/2012 Echo: LVEF 60-65%, mild LVH, no WMAs, grade II diastolic dysfunction, mode LAE.  02/2012 MPI:  Abnormal Lexiscan Myoview with no diagnostic electrocardiographic changes. The scintigraphic results show breast attenuation. There is also mild ischemia in the mid and basal inferolateral wall. The gated ejection fraction was 80% and the wall motion was normal.  10/2016 carotid US 1-39% bilateral disease  10/2016  echo ------------------------------------------------------------------- Study Conclusions  - Left ventricle: The cavity size was normal. Wall thickness was increased in a pattern of mild LVH. Systolic function was vigorous. The estimated ejection fraction was in the range of 65% to 70%. Wall motion was normal; there were no regional wall motion abnormalities. Doppler parameters are consistent with abnormal left ventricular relaxation (grade 1 diastolic dysfunction). Doppler parameters are consistent with high ventricular filling pressure. - Aortic valve: Mildly calcified annulus. Trileaflet; mildly thickened leaflets. Valve area (VTI): 1.56 cm^2. Valve area (Vmax): 1.7 cm^2. - Mitral valve: Mildly calcified annulus. Mildly thickened leaflets . - Left atrium: The atrium was moderately dilated. - Technically adequate study.      Assessment and Plan  1. Chronic diastolic HF  - some recent symptoms, she will take her lasix more regularly and follow symptoms.   2. Afib  - recent palpiutatins, pcp increased  beta blocker. Continue to monitor at this time.  - CHADS2VASC score is 4, continue coumadin.   3. HTN - elevated in clinic, home numbers at goal. conitnue current meds  4. Hyperlipidemia -request labs from pcp, continue current meds       Arnoldo Lenis, M.D.

## 2017-09-21 NOTE — Patient Instructions (Signed)
Your physician recommends that you schedule a follow-up appointment in: AS SCHEDULED IN September   Your physician recommends that you continue on your current medications as directed. Please refer to the Current Medication list given to you today.  Thank you for choosing [facility]!!

## 2017-09-30 DIAGNOSIS — L401 Generalized pustular psoriasis: Secondary | ICD-10-CM | POA: Diagnosis not present

## 2017-10-01 ENCOUNTER — Encounter: Payer: Self-pay | Admitting: Cardiology

## 2017-10-06 DIAGNOSIS — E1122 Type 2 diabetes mellitus with diabetic chronic kidney disease: Secondary | ICD-10-CM | POA: Diagnosis not present

## 2017-10-06 DIAGNOSIS — N183 Chronic kidney disease, stage 3 (moderate): Secondary | ICD-10-CM | POA: Diagnosis not present

## 2017-10-06 DIAGNOSIS — E1165 Type 2 diabetes mellitus with hyperglycemia: Secondary | ICD-10-CM | POA: Diagnosis not present

## 2017-10-06 DIAGNOSIS — Z299 Encounter for prophylactic measures, unspecified: Secondary | ICD-10-CM | POA: Diagnosis not present

## 2017-10-06 DIAGNOSIS — Z6838 Body mass index (BMI) 38.0-38.9, adult: Secondary | ICD-10-CM | POA: Diagnosis not present

## 2017-10-06 DIAGNOSIS — I4891 Unspecified atrial fibrillation: Secondary | ICD-10-CM | POA: Diagnosis not present

## 2017-10-07 DIAGNOSIS — R69 Illness, unspecified: Secondary | ICD-10-CM | POA: Diagnosis not present

## 2017-10-14 DIAGNOSIS — E119 Type 2 diabetes mellitus without complications: Secondary | ICD-10-CM | POA: Diagnosis not present

## 2017-10-14 DIAGNOSIS — I4891 Unspecified atrial fibrillation: Secondary | ICD-10-CM | POA: Diagnosis not present

## 2017-10-14 DIAGNOSIS — I10 Essential (primary) hypertension: Secondary | ICD-10-CM | POA: Diagnosis not present

## 2017-11-07 DIAGNOSIS — Z713 Dietary counseling and surveillance: Secondary | ICD-10-CM | POA: Diagnosis not present

## 2017-11-07 DIAGNOSIS — Z299 Encounter for prophylactic measures, unspecified: Secondary | ICD-10-CM | POA: Diagnosis not present

## 2017-11-07 DIAGNOSIS — I10 Essential (primary) hypertension: Secondary | ICD-10-CM | POA: Diagnosis not present

## 2017-11-07 DIAGNOSIS — I4891 Unspecified atrial fibrillation: Secondary | ICD-10-CM | POA: Diagnosis not present

## 2017-11-07 DIAGNOSIS — Z6837 Body mass index (BMI) 37.0-37.9, adult: Secondary | ICD-10-CM | POA: Diagnosis not present

## 2017-11-11 DIAGNOSIS — E119 Type 2 diabetes mellitus without complications: Secondary | ICD-10-CM | POA: Diagnosis not present

## 2017-11-11 DIAGNOSIS — I4891 Unspecified atrial fibrillation: Secondary | ICD-10-CM | POA: Diagnosis not present

## 2017-11-11 DIAGNOSIS — I10 Essential (primary) hypertension: Secondary | ICD-10-CM | POA: Diagnosis not present

## 2017-11-22 ENCOUNTER — Ambulatory Visit: Payer: Medicare HMO | Admitting: Cardiology

## 2017-11-25 DIAGNOSIS — Z6837 Body mass index (BMI) 37.0-37.9, adult: Secondary | ICD-10-CM | POA: Diagnosis not present

## 2017-11-25 DIAGNOSIS — Z299 Encounter for prophylactic measures, unspecified: Secondary | ICD-10-CM | POA: Diagnosis not present

## 2017-11-25 DIAGNOSIS — I10 Essential (primary) hypertension: Secondary | ICD-10-CM | POA: Diagnosis not present

## 2017-11-25 DIAGNOSIS — M7711 Lateral epicondylitis, right elbow: Secondary | ICD-10-CM | POA: Diagnosis not present

## 2017-12-05 DIAGNOSIS — I4891 Unspecified atrial fibrillation: Secondary | ICD-10-CM | POA: Diagnosis not present

## 2017-12-05 DIAGNOSIS — I10 Essential (primary) hypertension: Secondary | ICD-10-CM | POA: Diagnosis not present

## 2017-12-05 DIAGNOSIS — Z713 Dietary counseling and surveillance: Secondary | ICD-10-CM | POA: Diagnosis not present

## 2017-12-05 DIAGNOSIS — Z299 Encounter for prophylactic measures, unspecified: Secondary | ICD-10-CM | POA: Diagnosis not present

## 2017-12-05 DIAGNOSIS — Z6837 Body mass index (BMI) 37.0-37.9, adult: Secondary | ICD-10-CM | POA: Diagnosis not present

## 2017-12-07 ENCOUNTER — Other Ambulatory Visit: Payer: Self-pay | Admitting: Cardiology

## 2017-12-13 ENCOUNTER — Encounter: Payer: Self-pay | Admitting: *Deleted

## 2017-12-13 ENCOUNTER — Ambulatory Visit: Payer: Medicare HMO | Admitting: Cardiology

## 2017-12-13 ENCOUNTER — Encounter: Payer: Self-pay | Admitting: Cardiology

## 2017-12-13 ENCOUNTER — Encounter

## 2017-12-13 VITALS — BP 139/80 | HR 80 | Ht 63.0 in | Wt 204.6 lb

## 2017-12-13 DIAGNOSIS — I5032 Chronic diastolic (congestive) heart failure: Secondary | ICD-10-CM | POA: Diagnosis not present

## 2017-12-13 DIAGNOSIS — N183 Chronic kidney disease, stage 3 unspecified: Secondary | ICD-10-CM

## 2017-12-13 DIAGNOSIS — I4891 Unspecified atrial fibrillation: Secondary | ICD-10-CM

## 2017-12-13 NOTE — Patient Instructions (Signed)
Your physician wants you to follow-up in: 6 MONTHS WITH DR BRANCH You will receive a reminder letter in the mail two months in advance. If you don't receive a letter, please call our office to schedule the follow-up appointment.  Your physician recommends that you continue on your current medications as directed. Please refer to the Current Medication list given to you today.  Your physician recommends that you return for lab work BMP/MG  Thank you for choosing [facility]!!    

## 2017-12-13 NOTE — Progress Notes (Signed)
Clinical Summary Ms. Ferrante is a 84 y.o.female seen today for follow up of the following medical problems.  1. Chronic diastolic HF - working to limit diuretic due to her renal dysfunction   - last visit had some symptoms of SOB, LE edema. Weight at home up to 208 lbs.  She was to start taking her lasix more regularly.  -taking lasix more regulary. Home weights around 202, down from 208 lbs. Not much improving in breathing. Tolerates her regular activities well, moderate levels cause SOB/DOE.   2. Afib - no recent symptms.  - not interested NOACs due to cost.  - no bleeding on coumadin. INR followed by pcp Dr Woody Seller.    3. HTN - she is compliant with meds   4. Hyperlipidemia - compliant with statin.   5.Hyperklaemia - recent elevated K, pcp had her stop her valsartan     SH: spends free time sewing, baking.   Past Medical History:  Diagnosis Date  . Arthritis   . Atrial fibrillation ([place])   . Diabetes mellitus type II, controlled ([place])    x 15 yrs  . Elevated troponin   . Hemorrhoids   . Hypertension      Allergies  Allergen Reactions  . Penicillins Rash     Current Outpatient Medications  Medication Sig Dispense Refill  . atorvastatin (LIPITOR) 20 MG tablet TAKE ONE (1) TABLET EACH DAY 6PM 30 tablet 6  . calcium citrate-vitamin D (CITRACAL+D) 315-200 MG-UNIT per tablet Take 1 tablet by mouth daily.     Marland Kitchen diltiazem (CARDIZEM CD) 180 MG 24 hr capsule TAKE ONE (1) CAPSULE EACH DAY 90 capsule 1  . furosemide (LASIX) 20 MG tablet Take 20 mg by mouth daily as needed.    . Insulin Aspart Prot & Aspart (NOVOLOG MIX 70/30 [place]) Inject into the skin. 30 in morning and 15 at night.    . metoprolol tartrate (LOPRESSOR) 50 MG tablet Take 75 mg by mouth 2 (two) times daily.    . Multiple Vitamins-Minerals (PRESERVISION AREDS) TABS Take 1 tablet by mouth 2 (two) times daily.    Marland Kitchen warfarin (COUMADIN) 5 MG tablet Take 1 tablet (5 mg total) by mouth  daily. Or as directed 45 tablet 3   No current facility-administered medications for this visit.    Facility-Administered Medications Ordered in Other Visits  Medication Dose Route Frequency Provider Last Rate Last Dose  . neomycin-polymyxin-dexameth (MAXITROL) 0.1 % ophth ointment    PRN Tonny Anoop Hemmer, MD   1 application at 57/01/77 1427     Past Surgical History:  Procedure Laterality Date  . [place]   [facility]  . CATARACT EXTRACTION W/PHACO  01/11/2011   Procedure: CATARACT EXTRACTION PHACO AND INTRAOCULAR LENS PLACEMENT (IOC);  Surgeon: Tonny Toshiye Kever;  Location: [facility];  Service: Ophthalmology;  Laterality: Right;  CDE 19.32  . CATARACT EXTRACTION W/PHACO  01/21/2011   Procedure: CATARACT EXTRACTION PHACO AND INTRAOCULAR LENS PLACEMENT (IOC);  Surgeon: Tonny Shermika Balthaser;  Location: [facility];  Service: Ophthalmology;  Laterality: Left;  CDE: 21.19  . COLONOSCOPY N/A 09/12/2013   Procedure: COLONOSCOPY;  Surgeon: Rogene Houston, MD;  Location: [facility];  Service: Endoscopy;  Laterality: N/A;  125  . CYSTOSCOPY     [name]  . LITHOTRIPSY     [facility]  . PARTIAL HYSTERECTOMY       Allergies  Allergen Reactions  . Penicillins Rash      Family History  Problem Relation  Age of Onset  . Colon cancer Brother      Social History Ms. Petrucci reports that she has never smoked. She has never used smokeless tobacco. Ms. Volk reports that she does not drink alcohol.   Review of Systems CONSTITUTIONAL: No weight loss, fever, chills, weakness or fatigue.  HEENT: Eyes: No visual loss, blurred vision, double vision or yellow sclerae.No hearing loss, sneezing, congestion, runny nose or sore throat.  SKIN: No rash or itching.  CARDIOVASCULAR: per hpi RESPIRATORY: No shortness of breath, cough or sputum.  GASTROINTESTINAL: No anorexia, nausea, vomiting or diarrhea. No abdominal pain or blood.  GENITOURINARY: No burning on urination, no polyuria NEUROLOGICAL: No headache,  dizziness, syncope, paralysis, ataxia, numbness or tingling in the extremities. No change in bowel or bladder control.  MUSCULOSKELETAL: No muscle, back pain, joint pain or stiffness.  LYMPHATICS: No enlarged nodes. No history of splenectomy.  PSYCHIATRIC: No history of depression or anxiety.  ENDOCRINOLOGIC: No reports of sweating, cold or heat intolerance. No polyuria or polydipsia.  Marland Kitchen   Physical Examination Vitals:   12/13/17 1117  BP: 139/80  Pulse: 80   Vitals:   12/13/17 1117  Weight: 204 lb 9.6 oz (92.8 kg)  Height: 5\' 3"  (1.6 m)    Gen: resting comfortably, no acute distress HEENT: no scleral icterus, pupils equal round and reactive, no palptable cervical adenopathy,  CV: irreg, 2/6 systolic murmur rusb, no jvd Resp: Clear to auscultation bilaterally GI: abdomen is soft, non-tender, non-distended, normal bowel sounds, no hepatosplenomegaly MSK: extremities are warm, no edema.  Skin: warm, no rash Neuro:  no focal deficits Psych: appropriate affect   Diagnostic Studies 02/2012 Echo: LVEF 60-65%, mild LVH, no WMAs, grade II diastolic dysfunction, mode LAE.  02/2012 MPI:  Abnormal Lexiscan Myoview with no diagnostic electrocardiographic changes. The scintigraphic results show breast attenuation. There is also mild ischemia in the mid and basal inferolateral wall. The gated ejection fraction was 80% and the wall motion was normal.  10/2016 carotid US 1-39% bilateral disease  10/2016 echo ------------------------------------------------------------------- Study Conclusions  - Left ventricle: The cavity size was normal. Wall thickness was increased in a pattern of mild LVH. Systolic function was vigorous. The estimated ejection fraction was in the range of 65% to 70%. Wall motion was normal; there were no regional wall motion abnormalities. Doppler parameters are consistent with abnormal left ventricular relaxation (grade 1  diastolic dysfunction). Doppler parameters are consistent with high ventricular filling pressure. - Aortic valve: Mildly calcified annulus. Trileaflet; mildly thickened leaflets. Valve area (VTI): 1.56 cm^2. Valve area (Vmax): 1.7 cm^2. - Mitral valve: Mildly calcified annulus. Mildly thickened leaflets . - Left atrium: The atrium was moderately dilated. - Technically adequate study.     Assessment and Plan   1. Chronic diastolic HF - weight down about 5 lbs since taking lasix more regularly, LE edema has resolved.  - some ongoing SOB I think is more related to deconditioning - continue current diuretic regimen. Repeat BMET/Mg.   2. Afib  - CHADS2VASC score is 4, continue coumadin.  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      Arnoldo Lenis, M.D.

## 2017-12-16 ENCOUNTER — Telehealth: Payer: Self-pay | Admitting: *Deleted

## 2017-12-16 NOTE — Telephone Encounter (Signed)
Pt aware - routed to pcp  

## 2017-12-16 NOTE — Telephone Encounter (Signed)
-----   Message from Arnoldo Lenis, MD sent at 12/15/2017 12:33 PM EDT ----- Labs look good, kidney function remains decreased but improved from last check  Zandra Abts MD

## 2017-12-20 DIAGNOSIS — R69 Illness, unspecified: Secondary | ICD-10-CM | POA: Diagnosis not present

## 2018-01-02 DIAGNOSIS — I4891 Unspecified atrial fibrillation: Secondary | ICD-10-CM | POA: Diagnosis not present

## 2018-01-02 DIAGNOSIS — I10 Essential (primary) hypertension: Secondary | ICD-10-CM | POA: Diagnosis not present

## 2018-01-02 DIAGNOSIS — Z6837 Body mass index (BMI) 37.0-37.9, adult: Secondary | ICD-10-CM | POA: Diagnosis not present

## 2018-01-02 DIAGNOSIS — Z299 Encounter for prophylactic measures, unspecified: Secondary | ICD-10-CM | POA: Diagnosis not present

## 2018-01-02 DIAGNOSIS — R0602 Shortness of breath: Secondary | ICD-10-CM | POA: Diagnosis not present

## 2018-01-04 DIAGNOSIS — I10 Essential (primary) hypertension: Secondary | ICD-10-CM | POA: Diagnosis not present

## 2018-01-04 DIAGNOSIS — E119 Type 2 diabetes mellitus without complications: Secondary | ICD-10-CM | POA: Diagnosis not present

## 2018-01-04 DIAGNOSIS — I4891 Unspecified atrial fibrillation: Secondary | ICD-10-CM | POA: Diagnosis not present

## 2018-01-12 ENCOUNTER — Ambulatory Visit: Payer: Medicare HMO | Admitting: Cardiology

## 2018-01-13 DIAGNOSIS — Z299 Encounter for prophylactic measures, unspecified: Secondary | ICD-10-CM | POA: Diagnosis not present

## 2018-01-13 DIAGNOSIS — I10 Essential (primary) hypertension: Secondary | ICD-10-CM | POA: Diagnosis not present

## 2018-01-13 DIAGNOSIS — E1122 Type 2 diabetes mellitus with diabetic chronic kidney disease: Secondary | ICD-10-CM | POA: Diagnosis not present

## 2018-01-13 DIAGNOSIS — E1165 Type 2 diabetes mellitus with hyperglycemia: Secondary | ICD-10-CM | POA: Diagnosis not present

## 2018-01-13 DIAGNOSIS — Z6837 Body mass index (BMI) 37.0-37.9, adult: Secondary | ICD-10-CM | POA: Diagnosis not present

## 2018-01-13 DIAGNOSIS — N183 Chronic kidney disease, stage 3 (moderate): Secondary | ICD-10-CM | POA: Diagnosis not present

## 2018-01-26 DIAGNOSIS — R0602 Shortness of breath: Secondary | ICD-10-CM | POA: Diagnosis not present

## 2018-02-01 DIAGNOSIS — Z6837 Body mass index (BMI) 37.0-37.9, adult: Secondary | ICD-10-CM | POA: Diagnosis not present

## 2018-02-01 DIAGNOSIS — Z299 Encounter for prophylactic measures, unspecified: Secondary | ICD-10-CM | POA: Diagnosis not present

## 2018-02-01 DIAGNOSIS — I10 Essential (primary) hypertension: Secondary | ICD-10-CM | POA: Diagnosis not present

## 2018-02-01 DIAGNOSIS — I4891 Unspecified atrial fibrillation: Secondary | ICD-10-CM | POA: Diagnosis not present

## 2018-02-01 DIAGNOSIS — J449 Chronic obstructive pulmonary disease, unspecified: Secondary | ICD-10-CM | POA: Diagnosis not present

## 2018-02-22 DIAGNOSIS — R05 Cough: Secondary | ICD-10-CM | POA: Diagnosis not present

## 2018-02-22 DIAGNOSIS — J440 Chronic obstructive pulmonary disease with acute lower respiratory infection: Secondary | ICD-10-CM | POA: Diagnosis not present

## 2018-02-22 DIAGNOSIS — R0602 Shortness of breath: Secondary | ICD-10-CM | POA: Diagnosis not present

## 2018-02-22 DIAGNOSIS — J209 Acute bronchitis, unspecified: Secondary | ICD-10-CM | POA: Diagnosis not present

## 2018-02-22 DIAGNOSIS — Z6837 Body mass index (BMI) 37.0-37.9, adult: Secondary | ICD-10-CM | POA: Diagnosis not present

## 2018-02-22 DIAGNOSIS — I517 Cardiomegaly: Secondary | ICD-10-CM | POA: Diagnosis not present

## 2018-02-22 DIAGNOSIS — I10 Essential (primary) hypertension: Secondary | ICD-10-CM | POA: Diagnosis not present

## 2018-02-22 DIAGNOSIS — Z299 Encounter for prophylactic measures, unspecified: Secondary | ICD-10-CM | POA: Diagnosis not present

## 2018-02-22 DIAGNOSIS — J449 Chronic obstructive pulmonary disease, unspecified: Secondary | ICD-10-CM | POA: Diagnosis not present

## 2018-02-22 DIAGNOSIS — I4891 Unspecified atrial fibrillation: Secondary | ICD-10-CM | POA: Diagnosis not present

## 2018-02-24 DIAGNOSIS — R69 Illness, unspecified: Secondary | ICD-10-CM | POA: Diagnosis not present

## 2018-03-02 DIAGNOSIS — I4891 Unspecified atrial fibrillation: Secondary | ICD-10-CM | POA: Diagnosis not present

## 2018-03-02 DIAGNOSIS — Z299 Encounter for prophylactic measures, unspecified: Secondary | ICD-10-CM | POA: Diagnosis not present

## 2018-03-02 DIAGNOSIS — N184 Chronic kidney disease, stage 4 (severe): Secondary | ICD-10-CM | POA: Diagnosis not present

## 2018-03-02 DIAGNOSIS — Z6836 Body mass index (BMI) 36.0-36.9, adult: Secondary | ICD-10-CM | POA: Diagnosis not present

## 2018-03-02 DIAGNOSIS — E1122 Type 2 diabetes mellitus with diabetic chronic kidney disease: Secondary | ICD-10-CM | POA: Diagnosis not present

## 2018-03-02 DIAGNOSIS — I10 Essential (primary) hypertension: Secondary | ICD-10-CM | POA: Diagnosis not present

## 2018-03-02 DIAGNOSIS — E1165 Type 2 diabetes mellitus with hyperglycemia: Secondary | ICD-10-CM | POA: Diagnosis not present

## 2018-03-04 ENCOUNTER — Other Ambulatory Visit: Payer: Self-pay | Admitting: Cardiology

## 2018-03-10 DIAGNOSIS — I517 Cardiomegaly: Secondary | ICD-10-CM | POA: Diagnosis not present

## 2018-03-10 DIAGNOSIS — J449 Chronic obstructive pulmonary disease, unspecified: Secondary | ICD-10-CM | POA: Diagnosis not present

## 2018-03-10 DIAGNOSIS — R69 Illness, unspecified: Secondary | ICD-10-CM | POA: Diagnosis not present

## 2018-03-10 DIAGNOSIS — R918 Other nonspecific abnormal finding of lung field: Secondary | ICD-10-CM | POA: Diagnosis not present

## 2018-03-10 DIAGNOSIS — I700 Atherosclerosis of aorta: Secondary | ICD-10-CM | POA: Diagnosis not present

## 2018-03-10 DIAGNOSIS — J8410 Pulmonary fibrosis, unspecified: Secondary | ICD-10-CM | POA: Diagnosis not present

## 2018-03-16 DIAGNOSIS — I10 Essential (primary) hypertension: Secondary | ICD-10-CM | POA: Diagnosis not present

## 2018-03-16 DIAGNOSIS — Z299 Encounter for prophylactic measures, unspecified: Secondary | ICD-10-CM | POA: Diagnosis not present

## 2018-03-16 DIAGNOSIS — J8410 Pulmonary fibrosis, unspecified: Secondary | ICD-10-CM | POA: Diagnosis not present

## 2018-03-16 DIAGNOSIS — Z6836 Body mass index (BMI) 36.0-36.9, adult: Secondary | ICD-10-CM | POA: Diagnosis not present

## 2018-03-16 DIAGNOSIS — I4891 Unspecified atrial fibrillation: Secondary | ICD-10-CM | POA: Diagnosis not present

## 2018-03-16 DIAGNOSIS — E1165 Type 2 diabetes mellitus with hyperglycemia: Secondary | ICD-10-CM | POA: Diagnosis not present

## 2018-03-16 DIAGNOSIS — R918 Other nonspecific abnormal finding of lung field: Secondary | ICD-10-CM | POA: Diagnosis not present

## 2018-03-31 ENCOUNTER — Encounter: Payer: Self-pay | Admitting: Pulmonary Disease

## 2018-03-31 ENCOUNTER — Ambulatory Visit: Payer: Medicare HMO | Admitting: Pulmonary Disease

## 2018-03-31 VITALS — BP 124/80 | HR 91 | Ht 62.0 in | Wt 201.0 lb

## 2018-03-31 DIAGNOSIS — I2584 Coronary atherosclerosis due to calcified coronary lesion: Secondary | ICD-10-CM | POA: Diagnosis not present

## 2018-03-31 DIAGNOSIS — I2510 Atherosclerotic heart disease of native coronary artery without angina pectoris: Secondary | ICD-10-CM

## 2018-03-31 DIAGNOSIS — J9809 Other diseases of bronchus, not elsewhere classified: Secondary | ICD-10-CM | POA: Diagnosis not present

## 2018-03-31 DIAGNOSIS — R911 Solitary pulmonary nodule: Secondary | ICD-10-CM

## 2018-03-31 DIAGNOSIS — R0609 Other forms of dyspnea: Secondary | ICD-10-CM

## 2018-03-31 NOTE — Progress Notes (Signed)
Synopsis: Referred in January 2020 for left lower lobe lung nodule by Glenda Chroman, MD  Subjective:   PATIENT ID: Miranda Garcia GENDER: female DOB: 1930-05-29, MRN: [id number]  Chief Complaint  Patient presents with  . Consult    States she had PNE in Dec. 2018 and her SOB gradually has gotten worse. She recently got a CT and was made aware she has pulmonary nodules.     PMH of atrial fibrillation and DMII. She was told that she had abnormal office spirometry in the past.  Additionally she has a history of atrial fibrillation on Coumadin for the past several years.  She also has a history of kidney stones with a prior obstructed kidney requiring surgery for this.  Currently only has 1 functional kidney per the patient.  Prior CT imaging was completed in 2004 which revealed a 10 x 10 mm lung nodule in the left base.  Follow-up imaging in December evaluated for basilar changes in the chest x-ray that was completed due to shortness of breath by her primary care led to CT imaging in December 2019.  The CT scan completed this December revealed enlargement of this lower lobe nodule.  Now measuring 13 x 15 mm.  Patient presents today in the office with complaints of ongoing dyspnea.  Her dyspnea is predominantly associated with exertion.  She does not complain of any chest pain.  Of note her CT scan of the chest revealed significant coronary calcifications and evidence of atherosclerosis.   Past Medical History:  Diagnosis Date  . Arthritis   . Atrial fibrillation ([place])   . Diabetes mellitus type II, controlled ([place])    x 15 yrs  . Elevated troponin   . Hemorrhoids   . Hypertension      Family History  Problem Relation Age of Onset  . Colon cancer Brother      Past Surgical History:  Procedure Laterality Date  . [place]   [facility]  . CATARACT EXTRACTION W/PHACO  01/11/2011   Procedure: CATARACT EXTRACTION PHACO AND INTRAOCULAR LENS PLACEMENT (IOC);  Surgeon: Tonny Branch;   Location: [facility];  Service: Ophthalmology;  Laterality: Right;  CDE 19.32  . CATARACT EXTRACTION W/PHACO  01/21/2011   Procedure: CATARACT EXTRACTION PHACO AND INTRAOCULAR LENS PLACEMENT (IOC);  Surgeon: Tonny Branch;  Location: [facility];  Service: Ophthalmology;  Laterality: Left;  CDE: 21.19  . COLONOSCOPY N/A 09/12/2013   Procedure: COLONOSCOPY;  Surgeon: Rogene Houston, MD;  Location: [facility] SUITE;  Service: Endoscopy;  Laterality: N/A;  125  . CYSTOSCOPY     [name]  . LITHOTRIPSY     [facility]  . PARTIAL HYSTERECTOMY      Social History   Socioeconomic History  . Marital status: Widowed    Spouse name: Not on file  . Number of children: Not on file  . Years of education: Not on file  . Highest education level: Not on file  Occupational History  . Not on file  Social Needs  . Financial resource strain: Not on file  . Food insecurity:    Worry: Not on file    Inability: Not on file  . Transportation needs:    Medical: Not on file    Non-medical: Not on file  Tobacco Use  . Smoking status: Never Smoker  . Smokeless tobacco: Never Used  Substance and Sexual Activity  . Alcohol use: No    Alcohol/week: 0.0 standard drinks  . Drug use:  No  . Sexual activity: Not on file  Lifestyle  . Physical activity:    Days per week: Not on file    Minutes per session: Not on file  . Stress: Not on file  Relationships  . Social connections:    Talks on phone: Not on file    Gets together: Not on file    Attends religious service: Not on file    Active member of club or organization: Not on file    Attends meetings of clubs or organizations: Not on file    Relationship status: Not on file  . Intimate partner violence:    Fear of current or ex partner: Not on file    Emotionally abused: Not on file    Physically abused: Not on file    Forced sexual activity: Not on file  Other Topics Concern  . Not on file  Social History Narrative  . Not on file     Allergies  Allergen  Reactions  . Penicillins Rash     Outpatient Medications Prior to Visit  Medication Sig Dispense Refill  . atorvastatin (LIPITOR) 20 MG tablet TAKE ONE (1) TABLET EACH DAY 6PM 30 tablet 6  . calcium citrate-vitamin D (CITRACAL+D) 315-200 MG-UNIT per tablet Take 1 tablet by mouth daily.     . cholecalciferol (VITAMIN D) 400 units TABS tablet Take 400 Units by mouth daily.    Marland Kitchen diltiazem (CARDIZEM CD) 180 MG 24 hr capsule TAKE ONE (1) CAPSULE EACH DAY 90 capsule 1  . furosemide (LASIX) 40 MG tablet TAKE 1 TABLET DAILY AS NEEDED FOR SWELLING 90 tablet 1  . Insulin Aspart Prot & Aspart (NOVOLOG MIX 70/30 [place]) Inject into the skin. 25 in morning and 25 at night.    . metoprolol tartrate (LOPRESSOR) 50 MG tablet Take 75 mg by mouth 2 (two) times daily.    Marland Kitchen warfarin (COUMADIN) 5 MG tablet Take 1 tablet (5 mg total) by mouth daily. Or as directed 45 tablet 3  . furosemide (LASIX) 20 MG tablet Take 20 mg by mouth daily as needed.    . Multiple Vitamins-Minerals (PRESERVISION AREDS) TABS Take 1 tablet by mouth 2 (two) times daily.     Facility-Administered Medications Prior to Visit  Medication Dose Route Frequency Provider Last Rate Last Dose  . neomycin-polymyxin-dexameth (MAXITROL) 0.1 % ophth ointment    PRN Tonny Branch, MD   1 application at 57/32/20 1427    Review of Systems  Constitutional: Negative for chills, fever, malaise/fatigue and weight loss.  HENT: Negative for hearing loss, sore throat and tinnitus.   Eyes: Negative for blurred vision and double vision.  Respiratory: Positive for shortness of breath. Negative for cough, hemoptysis, sputum production, wheezing and stridor.        Dyspnea on exertion  Cardiovascular: Positive for orthopnea. Negative for chest pain, palpitations, leg swelling and PND.  Gastrointestinal: Negative for abdominal pain, constipation, diarrhea, heartburn, nausea and vomiting.  Genitourinary: Negative for dysuria, hematuria and urgency.  Musculoskeletal:  Negative for joint pain and myalgias.  Skin: Negative for itching and rash.  Neurological: Negative for dizziness, tingling, weakness and headaches.  Endo/Heme/Allergies: Negative for environmental allergies. Does not bruise/bleed easily.  Psychiatric/Behavioral: Negative for depression. The patient is not nervous/anxious and does not have insomnia.   All other systems reviewed and are negative.    Objective:  Physical Exam Vitals signs reviewed.  Constitutional:      General: She is not in acute distress.  Appearance: She is well-developed.  HENT:     Head: Normocephalic and atraumatic.  Eyes:     General: No scleral icterus.    Conjunctiva/sclera: Conjunctivae normal.     Pupils: Pupils are equal, round, and reactive to light.  Neck:     Musculoskeletal: Neck supple.     Vascular: No JVD.     Trachea: No tracheal deviation.  Cardiovascular:     Rate and Rhythm: Normal rate and regular rhythm.     Heart sounds: Normal heart sounds. No murmur.  Pulmonary:     Effort: Pulmonary effort is normal. No tachypnea, accessory muscle usage or respiratory distress.     Breath sounds: No stridor. No wheezing, rhonchi or rales.     Comments: Few faint basilar crackles. Abdominal:     General: Bowel sounds are normal. There is no distension.     Palpations: Abdomen is soft.     Tenderness: There is no abdominal tenderness.  Musculoskeletal:        General: No tenderness.  Lymphadenopathy:     Cervical: No cervical adenopathy.  Skin:    General: Skin is warm and dry.     Capillary Refill: Capillary refill takes less than 2 seconds.     Findings: No rash.  Neurological:     Mental Status: She is alert and oriented to person, place, and time.  Psychiatric:        Behavior: Behavior normal.      Vitals:   03/31/18 0934  Pulse: 91  SpO2: 99%  Weight: 201 lb (91.2 kg)  Height: 5\' 2"  (1.575 m)   99% on RA BMI Readings from Last 3 Encounters:  03/31/18 36.76 kg/m  12/13/17  36.24 kg/m  09/21/17 37.02 kg/m   Wt Readings from Last 3 Encounters:  03/31/18 201 lb (91.2 kg)  12/13/17 204 lb 9.6 oz (92.8 kg)  09/21/17 209 lb (94.8 kg)     CBC    Component Value Date/Time   WBC 5.4 03/21/2017 1248   RBC 3.96 03/21/2017 1248   HGB 11.6 (L) 03/21/2017 1248   HCT 37.6 03/21/2017 1248   PLT 138 (L) 03/21/2017 1248   MCV 94.9 03/21/2017 1248   MCH 29.3 03/21/2017 1248   MCHC 30.9 03/21/2017 1248   RDW 14.3 03/21/2017 1248   LYMPHSABS 1.4 02/25/2012 2130   MONOABS 1.2 (H) 02/25/2012 2130   EOSABS 0.0 02/25/2012 2130   BASOSABS 0.1 02/25/2012 2130    Chest Imaging: 03/10/2018: CT chest Review of the imaging reveals a small basilar peripheral groundglass changes to me likely represents atelectasis.  To see if this was to disappear patient would need prone imaging.  Pulmonary Functions Testing Results: No flowsheet data found.  FeNO: None   Pathology: None   Echocardiogram:   Study Conclusions  - Left ventricle: The cavity size was normal. Wall thickness was   increased in a pattern of mild LVH. Systolic function was   vigorous. The estimated ejection fraction was in the range of 65%   to 70%. Wall motion was normal; there were no regional wall   motion abnormalities. Doppler parameters are consistent with   abnormal left ventricular relaxation (grade 1 diastolic   dysfunction). Doppler parameters are consistent with high   ventricular filling pressure. - Aortic valve: Mildly calcified annulus. Trileaflet; mildly   thickened leaflets. Valve area (VTI): 1.56 cm^2. Valve area   (Vmax): 1.7 cm^2. - Mitral valve: Mildly calcified annulus. Mildly thickened leaflets   . - Left  atrium: The atrium was moderately dilated. - Technically adequate study.  Heart Catheterization: None    Assessment & Plan:   Solitary pulmonary nodule - Plan: Pulmonary function test, CT Chest Wo Contrast  DOE (dyspnea on exertion)  Coronary artery  calcification  Calcification of bronchial airway  Discussion:  This is a 85 year old female with a history of a left lower lobe lung nodule.  This has seen on previous CT imaging 15 years ago.  This has grown very slow over the past 15 years.  I suspect it is of benign etiology.  However due to its size and slow growth rate one must consider diseases such as carcinoid tumor.  Looking at the imaging and density on CT it does have some characteristics of benign etiology such as a hamartoma which may enlarge slowly over time.  No matter the case, the patient would not want to undergo any invasive procedure such as a thoracic surgery to have the lesion removed.  After discussions of risks, benefits and alternatives of proceeding with pet imaging versus follow-up noncontrast CT imaging.  The patient has decided she would like to have a follow-up CT image in 3 months.  After discussing the utility of pet imaging with lung nodules.  She said even if the PET was positive and concerning for malignancy she would not want to undergo surgery for removal as she does not believe she would recover well from a thoracic surgery at her age.  Therefore we have elected to complete a 49-month noncontrasted CT of the chest to see if there is any change.  Otherwise she would like to continue to observe.  She can follow-up with me in 3 months after her CT imaging.  As for the dyspnea on exertion she does have significant coronary calcification.  She has had an echocardiogram in 2018.  I would encourage her to follow-up with her cardiologist for further evaluation of this.  We will also obtain pulmonary function test to be completed within the next 1 to 2 weeks and the patient will follow-up with one of our nurse practitioners in clinic to discuss these results.  She does have some tracheobronchial calcifications.  This is likely consistent with her longstanding history of Coumadin which is a known cause.  Greater than 50%  of this patient 60-minute office visit with spent face-to-face discussing above recommendations and treatment plan.   Current Outpatient Medications:  .  atorvastatin (LIPITOR) 20 MG tablet, TAKE ONE (1) TABLET EACH DAY 6PM, Disp: 30 tablet, Rfl: 6 .  calcium citrate-vitamin D (CITRACAL+D) 315-200 MG-UNIT per tablet, Take 1 tablet by mouth daily. , Disp: , Rfl:  .  cholecalciferol (VITAMIN D) 400 units TABS tablet, Take 400 Units by mouth daily., Disp: , Rfl:  .  diltiazem (CARDIZEM CD) 180 MG 24 hr capsule, TAKE ONE (1) CAPSULE EACH DAY, Disp: 90 capsule, Rfl: 1 .  furosemide (LASIX) 40 MG tablet, TAKE 1 TABLET DAILY AS NEEDED FOR SWELLING, Disp: 90 tablet, Rfl: 1 .  Insulin Aspart Prot & Aspart (NOVOLOG MIX 70/30 [place]), Inject into the skin. 25 in morning and 25 at night., Disp: , Rfl:  .  metoprolol tartrate (LOPRESSOR) 50 MG tablet, Take 75 mg by mouth 2 (two) times daily., Disp: , Rfl:  .  warfarin (COUMADIN) 5 MG tablet, Take 1 tablet (5 mg total) by mouth daily. Or as directed, Disp: 45 tablet, Rfl: 3 No current facility-administered medications for this visit.   Facility-Administered Medications Ordered in Other Visits:  .  neomycin-polymyxin-dexameth (MAXITROL) 0.1 % ophth ointment, , , PRN, Tonny Branch, MD, 1 application at 31/54/00 [facility] 03/31/2018 9:57 AM

## 2018-03-31 NOTE — Patient Instructions (Addendum)
Thank you for visiting Dr. Valeta Harms at [facility].  Today we recommend the following: Orders Placed This Encounter  Procedures  . CT Chest Wo Contrast  . Pulmonary function test   Return in about 2 weeks (around 04/14/2018). with NP to review PFTs.   Also, schedule 3 month follow up with Octavio Graves Jacobey Gura, DO to review CT Scan.

## 2018-04-03 ENCOUNTER — Telehealth: Payer: Self-pay | Admitting: *Deleted

## 2018-04-03 NOTE — Telephone Encounter (Signed)
Can see her if February if there is an opening, can use a 940 slot or a 320 or 340 slot on a echo reading day   Zandra Abts MD

## 2018-04-03 NOTE — Telephone Encounter (Signed)
Message left on daughter's vm that an appointment has been scheduled for patient to see Dr. Harl Bowie on 04/28/2018 @2 :00 pm at the [facility] office.

## 2018-04-03 NOTE — Telephone Encounter (Signed)
Per daughter, patient was seen by her pulmonologist on Friday and was told to follow up with her cardiologist due to calcification on her aorta and the pulmonologist felt this is what is causing her SOB. Daughter is requesting an appointment.

## 2018-04-17 ENCOUNTER — Encounter: Payer: Self-pay | Admitting: Acute Care

## 2018-04-17 ENCOUNTER — Ambulatory Visit (INDEPENDENT_AMBULATORY_CARE_PROVIDER_SITE_OTHER): Payer: Medicare HMO | Admitting: Pulmonary Disease

## 2018-04-17 ENCOUNTER — Ambulatory Visit: Payer: Medicare HMO | Admitting: Acute Care

## 2018-04-17 DIAGNOSIS — R911 Solitary pulmonary nodule: Secondary | ICD-10-CM | POA: Diagnosis not present

## 2018-04-17 DIAGNOSIS — R918 Other nonspecific abnormal finding of lung field: Secondary | ICD-10-CM | POA: Diagnosis not present

## 2018-04-17 DIAGNOSIS — R0600 Dyspnea, unspecified: Secondary | ICD-10-CM | POA: Diagnosis not present

## 2018-04-17 DIAGNOSIS — Z299 Encounter for prophylactic measures, unspecified: Secondary | ICD-10-CM | POA: Diagnosis not present

## 2018-04-17 DIAGNOSIS — R933 Abnormal findings on diagnostic imaging of other parts of digestive tract: Secondary | ICD-10-CM

## 2018-04-17 DIAGNOSIS — I10 Essential (primary) hypertension: Secondary | ICD-10-CM | POA: Diagnosis not present

## 2018-04-17 DIAGNOSIS — Z6837 Body mass index (BMI) 37.0-37.9, adult: Secondary | ICD-10-CM | POA: Diagnosis not present

## 2018-04-17 DIAGNOSIS — I4891 Unspecified atrial fibrillation: Secondary | ICD-10-CM | POA: Diagnosis not present

## 2018-04-17 LAB — PULMONARY FUNCTION TEST
DL/VA % PRED: 111 %
DL/VA: 5.21 ml/min/mmHg/L
DLCO UNC % PRED: 62 %
DLCO unc: 14.27 ml/min/mmHg
FEF 25-75 POST: 1.13 L/s
FEF 25-75 PRE: 1 L/s
FEF2575-%Change-Post: 12 %
FEF2575-%PRED-POST: 103 %
FEF2575-%Pred-Pre: 91 %
FEV1-%CHANGE-POST: 3 %
FEV1-%PRED-PRE: 72 %
FEV1-%Pred-Post: 75 %
FEV1-POST: 1.25 L
FEV1-Pre: 1.21 L
FEV1FVC-%Change-Post: 5 %
FEV1FVC-%PRED-PRE: 106 %
FEV6-%Change-Post: 0 %
FEV6-%PRED-POST: 72 %
FEV6-%Pred-Pre: 72 %
FEV6-POST: 1.54 L
FEV6-Pre: 1.55 L
FEV6FVC-%Change-Post: 0 %
FEV6FVC-%PRED-POST: 106 %
FEV6FVC-%Pred-Pre: 105 %
FVC-%Change-Post: -1 %
FVC-%Pred-Post: 67 %
FVC-%Pred-Pre: 69 %
FVC-Post: 1.54 L
FVC-Pre: 1.57 L
POST FEV1/FVC RATIO: 81 %
PRE FEV1/FVC RATIO: 77 %
PRE FEV6/FVC RATIO: 99 %
Post FEV6/FVC ratio: 100 %
RV % pred: 59 %
RV: 1.45 L
TLC % PRED: 65 %
TLC: 3.21 L

## 2018-04-17 LAB — CBC WITH DIFFERENTIAL/PLATELET
BASOS PCT: 1.2 % (ref 0.0–3.0)
Basophils Absolute: 0.1 10*3/uL (ref 0.0–0.1)
Eosinophils Absolute: 0.1 10*3/uL (ref 0.0–0.7)
Eosinophils Relative: 1.4 % (ref 0.0–5.0)
HCT: 39.5 % (ref 36.0–46.0)
Hemoglobin: 13 g/dL (ref 12.0–15.0)
LYMPHS ABS: 2.5 10*3/uL (ref 0.7–4.0)
LYMPHS PCT: 26.7 % (ref 12.0–46.0)
MCHC: 32.8 g/dL (ref 30.0–36.0)
MCV: 92.9 fl (ref 78.0–100.0)
MONO ABS: 1.1 10*3/uL — AB (ref 0.1–1.0)
MONOS PCT: 11.3 % (ref 3.0–12.0)
NEUTROS ABS: 5.6 10*3/uL (ref 1.4–7.7)
NEUTROS PCT: 59.4 % (ref 43.0–77.0)
PLATELETS: 172 10*3/uL (ref 150.0–400.0)
RBC: 4.25 Mil/uL (ref 3.87–5.11)
RDW: 14.8 % (ref 11.5–15.5)
WBC: 9.4 10*3/uL (ref 4.0–10.5)

## 2018-04-17 NOTE — Assessment & Plan Note (Addendum)
Worsening dyspnea since pneumonia diagnosis about 1 year ago CT with ground glass changes ?  R base ILD vs atelectasis PFT's with no obstruction, but mild decrease in DLCO ( 14.27 or 62% predicted) Crackles per bases R>L  No Amiodarone/ Bleomycin/Methotrexate in med history Body mass index is 35.96 kg/m.  Deconditioned Consider possible cardiogenic etiology Consider possible OSA/PAH as contributing factor Plan HRCT without contrast in 3 months >> ILD protocol Consider 6 minute walk Consider home sleep study to r/o OSA Follow up with Dr. Harl Bowie 04/28/2018 as is already scheduled  Consider Echo/ eval PAP Consider silver sneakers / pulmonary rehab Follow up Dr. Valeta Harms 07/12/2018 as is scheduled. Please contact office for sooner follow up if symptoms do not improve or worsen or seek emergency care

## 2018-04-17 NOTE — Addendum Note (Signed)
Addended by: Jannette Spanner on: 04/17/2018 12:17 PM   Modules accepted: Orders

## 2018-04-17 NOTE — Assessment & Plan Note (Signed)
Multiple pulmonary nodules per CT 02/2018 20-25  pulmonary nodules the largest of which is on the left which is 13 x 15 mm ( was 10 mm x 10 mm in 2004)  Pt. Would not pursue invasive surgery at her age Opted for follow up CT in 3 months Plan: Follow up CT 06/2018 prior to  follow up Dr. Valeta Harms 07/12/2018 Consider PET scanning if indicated based on follow up results

## 2018-04-17 NOTE — Progress Notes (Signed)
History of Present Illness Miranda Garcia is a 85 y.o. female never smoker  referred for LLL lung nodule by Dr. Woody Seller, MD.She is followed by Miranda Garcia.  Synopsis 85 year old female with a history of a left lower lobe lung nodule,  seen on previous CT imaging 15 years ago,  that  has grown very slowly over the past 15 years. Miranda Garcia suspects it is benign, but  due to its size and slow growth rate differential diagnosis must include  diseases such as carcinoid tumor.  CT  imaging and density on CT show some characteristics of benign etiology such as a hamartoma which may enlarge slowly over time. At 52,  the patient states she would not pursue invasive procedures to remove the nodule. She has opted for a 3 month follow up CT without contrast in 3 months.PFT's were ordered to evaluate for obstructive or pulmonary etiology. Additional considerations are cardiac etiology.   04/17/2018  Follow up for dyspnea to review PFT's. Pt. Presents for review of PFT's. PFT's are as noted below. No obstruction per testing, but mild decrease in DLCO.HGB 13.0 day of PFT's She states she has had dyspnea for several years, but it became  significantly worse after her bout with pneumonia about 1 year ago.She has a dry cough, that she states is mild She has been on coumadin x 5 years for atrial fibrillation. Body mass index is 35.96 kg/m. Per her own accounting, she is not very active.She denies fever, chest pain, orthopnea or hemoptysis.    Test Results: PFT's 04/17/2018 FVC 1.57 or 69% predicted FEV1 1.21 or 72% predicted F/F ratio 77 or 106% predicted FEF 25-75% 1.0 or 91% predicted TLC 3.21 or 65% predicted DLCO 14.27 or 65% predicted Interpretation No obstruction ? Mild restriction Mild reduction  in DLCO 62%  HGB day of PFT's 13.0 g/dL  CXR 02/24/2018 Cardiomegaly.  Low lung volumes.  No acute findings.  CT Chest 02/2018>> Small basilar peripheral groundglass changes  likely represents  atelectasis 20-25  pulmonary nodules the largest of which is on the left which is 13 x 15 mm ( was 10 mm x 10 mm in 2004) Right lung base with ground glass changes that may represent ILD changes, area of consolidation spanning 89mm x 10 mm .  10/2016>>Echo Left ventricle: The cavity size was normal. Wall thickness was increased in a pattern of mild LVH. Systolic function was vigorous. The estimated ejection fraction was in the range of 65% to 70%. Wall motion was normal; there were no regional wall motion abnormalities. Doppler parameters are consistent with abnormal left ventricular relaxation (grade 1 diastolic dysfunction). Doppler parameters are consistent with high ventricular filling pressure. - Aortic valve: Mildly calcified annulus. Trileaflet; mildly thickened leaflets. Valve area (VTI): 1.56 cm^2. Valve area (Vmax): 1.7 cm^2. - Mitral valve: Mildly calcified annulus. Mildly thickened leaflets - Left atrium: The atrium was moderately dilated. - Technically adequate study.  CBC Latest Ref Rng & Units 04/17/2018 03/21/2017 02/29/2012  WBC 4.0 - 10.5 K/uL 9.4 5.4 7.1  Hemoglobin 12.0 - 15.0 g/dL 13.0 11.6(L) 10.5(L)  Hematocrit 36.0 - 46.0 % 39.5 37.6 31.4(L)  Platelets 150.0 - 400.0 K/uL 172.0 138(L) 146(L)    BMP Latest Ref Rng & Units 03/21/2017 02/29/2012 02/28/2012  Glucose 65 - 99 mg/dL 178(H) 95 155(H)  BUN 6 - 20 mg/dL 35(H) 30(H) 33(H)  Creatinine 0.44 - 1.00 mg/dL 1.70(H) 1.60(H) 1.74(H)  Sodium 135 - 145 mmol/L 138 141 139  Potassium 3.5 -  5.1 mmol/L 4.7 4.2 3.6  Chloride 101 - 111 mmol/L 106 106 102  CO2 22 - 32 mmol/L 21(L) 27 28  Calcium 8.9 - 10.3 mg/dL 8.7(L) 8.0(L) 8.4    BNP No results found for: BNP  ProBNP No results found for: PROBNP  PFT    Component Value Date/Time   FEV1PRE 1.21 04/17/2018 0954   FEV1POST 1.25 04/17/2018 0954   FVCPRE 1.57 04/17/2018 0954   FVCPOST 1.54 04/17/2018 0954   TLC 3.21 04/17/2018 0954   DLCOUNC  14.27 04/17/2018 0954   PREFEV1FVCRT 77 04/17/2018 0954   PSTFEV1FVCRT 81 04/17/2018 0954    No results found.   Past medical hx Past Medical History:  Diagnosis Date  . Arthritis   . Atrial fibrillation ([place])   . Diabetes mellitus type II, controlled ([place])    x 15 yrs  . Elevated troponin   . Hemorrhoids   . Hypertension      Social History   Tobacco Use  . Smoking status: Never Smoker  . Smokeless tobacco: Never Used  Substance Use Topics  . Alcohol use: No    Alcohol/week: 0.0 standard drinks  . Drug use: No    Miranda Garcia reports that she has never smoked. She has never used smokeless tobacco. She reports that she does not drink alcohol or use drugs.  Tobacco Cessation: Never smoker Past surgical hx, Family hx, Social hx all reviewed.  Current Outpatient Medications on File Prior to Visit  Medication Sig  . atorvastatin (LIPITOR) 20 MG tablet TAKE ONE (1) TABLET EACH DAY 6PM  . calcium citrate-vitamin D (CITRACAL+D) 315-200 MG-UNIT per tablet Take 1 tablet by mouth daily.   . cholecalciferol (VITAMIN D) 400 units TABS tablet Take 400 Units by mouth daily.  Marland Kitchen diltiazem (CARDIZEM CD) 180 MG 24 hr capsule TAKE ONE (1) CAPSULE EACH DAY  . furosemide (LASIX) 40 MG tablet TAKE 1 TABLET DAILY AS NEEDED FOR SWELLING  . Insulin Aspart Prot & Aspart (NOVOLOG MIX 70/30 [place]) Inject into the skin. 25 in morning and 25 at night.  . metoprolol tartrate (LOPRESSOR) 50 MG tablet Take 75 mg by mouth 2 (two) times daily.  Marland Kitchen warfarin (COUMADIN) 5 MG tablet Take 1 tablet (5 mg total) by mouth daily. Or as directed   Current Facility-Administered Medications on File Prior to Visit  Medication  . neomycin-polymyxin-dexameth (MAXITROL) 0.1 % ophth ointment     Allergies  Allergen Reactions  . Penicillins Rash    Review Of Systems:  Constitutional:   No  weight loss, night sweats,  Fevers, chills,+  fatigue, or  lassitude.  HEENT:   No headaches,  Difficulty swallowing,   Tooth/dental problems, or  Sore throat,                No sneezing, itching, ear ache, nasal congestion, post nasal drip,   CV:  No chest pain,  Orthopnea, PND, swelling in lower extremities, anasarca, dizziness, palpitations, syncope.   GI  No heartburn, indigestion, abdominal pain, nausea, vomiting, diarrhea, change in bowel habits, loss of appetite, bloody stools.   Resp: + shortness of breath with exertion or at rest.  No excess mucus, no productive cough,  + non-productive cough,  No coughing up of blood.  No change in color of mucus.  +  Wheezing while lying supine, resolves with turning on side.  No chest wall deformity  Skin: no rash or lesions.  GU: no dysuria, change in color of urine, no urgency or frequency.  No flank pain, no hematuria   MS:  No joint pain or swelling.  No decreased range of motion.  No back pain.  Psych:  No change in mood or affect. No depression or anxiety.  No memory loss.   Vital Signs BP (!) 152/98 (BP Location: Left Arm, Cuff Size: Large)   Pulse 78   Ht 5\' 3"  (1.6 m)   Wt 203 lb (92.1 kg)   SpO2 97%   BMI 35.96 kg/m    Physical Exam:  General- No distress,  A&Ox3, pleasant ENT: No sinus tenderness, TM clear, pale nasal mucosa, no oral exudate,no post nasal drip, no LAN Cardiac: S1, S2, regular rate and rhythm, no murmur Chest: No wheeze/ rales/ dullness; no accessory muscle use, no nasal flaring, no sternal retractions, + crackles per bases R>L Abd.: Soft Non-tender, ND, BS +, Body mass index is 35.96 kg/m. Ext: No clubbing cyanosis, edema Neuro:  Cranial nerves intact, deconditioned Skin: No rashes, warm and dry Psych: normal mood and behavior, appropriate   Assessment/Plan  Dyspnea Worsening dyspnea since pneumonia diagnosis about 1 year ago CT with ground glass changes ?  R base ILD vs atelectasis PFT's with no obstruction, but mild decrease in DLCO ( 14.27 or 62% predicted) Crackles per bases R>L atelectasis vs changes of  ILD No Amiodarone/ Bleomycin/Methotrexate in med history Body mass index is 35.96 kg/m.  Deconditioned Consider possible cardiogenic etiology Consider possible OSA/PAH as contributing factor Plan HRCT without contrast in 3 months >> ILD protocol Consider 6 minute walk Consider home sleep study to r/o OSA Follow up with Dr. Harl Bowie 04/28/2018 as is already scheduled  Consider Echo/ eval PAP Consider silver sneakers / pulmonary rehab Follow up Miranda Garcia 07/12/2018 as is scheduled. Please contact office for sooner follow up if symptoms do not improve or worsen or seek emergency care     Pulmonary nodules Multiple pulmonary nodules per CT 02/2018 20-25  pulmonary nodules the largest of which is on the left which is 13 x 15 mm ( was 10 mm x 10 mm in 2004)  Pt. Would not pursue invasive surgery at her age Opted for follow up CT in 3 months Plan: Follow up CT 06/2018 prior to  follow up Miranda Garcia 07/12/2018 Consider PET scanning if indicated based on follow up results  Tracheobronchial Calcifications per CT Coumadin Therapy for Atrial fibrillation x 5 years Upper Airway Wheezing when flat and supine, completely resolves with turing to side Plan  Consider change to DOAC Consider sleep study  This visit included 40 minutes of face to face time with the patient and her daughter obtaining history, performing a physical exam and counseling patient.  Magdalen Spatz, NP 04/17/2018  8:13 PM

## 2018-04-17 NOTE — Progress Notes (Signed)
PFT done today. 

## 2018-04-17 NOTE — Addendum Note (Signed)
Addended by: Suzzanne Cloud E on: 04/17/2018 12:03 PM   Modules accepted: Orders

## 2018-04-17 NOTE — Patient Instructions (Addendum)
It is good  see you today. We will schedule your CT Chest ( already ordered) for April.( Schedule before 07/12/2018 when patient has follow up with Dr. Valeta Harms.to evaluate your pulmonary nodules.) We will change to HRCT without contrast per ILD protocol We will do a CBC today. Follow up with Dr. Harl Bowie 04/28/2018 as is scheduled for cardiac follow up. Follow up with Dr. Valeta Harms 07/12/2018 as is scheduled Please contact office for sooner follow up if symptoms do not improve or worsen or seek emergency care   .

## 2018-04-19 ENCOUNTER — Telehealth: Payer: Self-pay | Admitting: Pulmonary Disease

## 2018-04-19 NOTE — Telephone Encounter (Signed)
LMTCB

## 2018-04-19 NOTE — Telephone Encounter (Signed)
Call made to patient daughter, made aware of labs. She states someone had already spoken to her regarding the Chest CT. Nothing further is needed at this time.

## 2018-04-19 NOTE — Telephone Encounter (Signed)
Katharine Look, patient's daughter, CB is [phone] (work)

## 2018-04-19 NOTE — Telephone Encounter (Signed)
Patient retuning phone call.  Patient phone number is [phone].  Can also call daughter Katharine Look [phone].

## 2018-04-19 NOTE — Telephone Encounter (Signed)
Notes recorded by Magdalen Spatz, NP on 04/17/2018 at 8:33 PM EST Please let patient know that her HGB is 13 which is normal.  Also, please make sure the HRCT without contrast scheduled for April is done using the ILD protocol with [place], [employer] as readers. Thanks ------------------------------------------------ ATC pt, received a message stating, "The party you are trying to reach is not accepting calls at this time." Will try back.

## 2018-04-20 DIAGNOSIS — Z299 Encounter for prophylactic measures, unspecified: Secondary | ICD-10-CM | POA: Diagnosis not present

## 2018-04-20 DIAGNOSIS — N184 Chronic kidney disease, stage 4 (severe): Secondary | ICD-10-CM | POA: Diagnosis not present

## 2018-04-20 DIAGNOSIS — I10 Essential (primary) hypertension: Secondary | ICD-10-CM | POA: Diagnosis not present

## 2018-04-20 DIAGNOSIS — J449 Chronic obstructive pulmonary disease, unspecified: Secondary | ICD-10-CM | POA: Diagnosis not present

## 2018-04-20 DIAGNOSIS — E1122 Type 2 diabetes mellitus with diabetic chronic kidney disease: Secondary | ICD-10-CM | POA: Diagnosis not present

## 2018-04-20 DIAGNOSIS — E1165 Type 2 diabetes mellitus with hyperglycemia: Secondary | ICD-10-CM | POA: Diagnosis not present

## 2018-04-20 DIAGNOSIS — Z6836 Body mass index (BMI) 36.0-36.9, adult: Secondary | ICD-10-CM | POA: Diagnosis not present

## 2018-04-22 NOTE — Progress Notes (Signed)
PCCM: Agree. Thanks for seeing her.  [facility] 04/22/2018 6:34 PM

## 2018-04-25 DIAGNOSIS — E1140 Type 2 diabetes mellitus with diabetic neuropathy, unspecified: Secondary | ICD-10-CM | POA: Diagnosis not present

## 2018-04-25 DIAGNOSIS — E1159 Type 2 diabetes mellitus with other circulatory complications: Secondary | ICD-10-CM | POA: Diagnosis not present

## 2018-04-28 ENCOUNTER — Ambulatory Visit: Payer: Medicare HMO | Admitting: Cardiology

## 2018-04-28 ENCOUNTER — Telehealth: Payer: Self-pay | Admitting: Cardiology

## 2018-04-28 ENCOUNTER — Encounter: Payer: Self-pay | Admitting: Cardiology

## 2018-04-28 VITALS — BP 142/79 | HR 78 | Ht 63.0 in | Wt 203.4 lb

## 2018-04-28 DIAGNOSIS — R0600 Dyspnea, unspecified: Secondary | ICD-10-CM | POA: Diagnosis not present

## 2018-04-28 DIAGNOSIS — I5032 Chronic diastolic (congestive) heart failure: Secondary | ICD-10-CM | POA: Diagnosis not present

## 2018-04-28 NOTE — Progress Notes (Signed)
Clinical Summary Miranda Garcia is a 85 y.o. female seen today for follow up of the following medical problems.This is a focused visit on recent symptoms of SOB and her history of diastolic HF.   1. Chronic diastolic HF - working to limit diuretic due to her renal dysfunction  - worsening DOE with activities - DOE walking from parking lot today. DOE at [employer]. DOE at 1/2 to 1 block - no recent edema - no orthopnea but can have wheezing.  - no chest pain. Rare palpitations.      2. Lung nodule - followed by pulmonary, enlarged by CT 02/2018 PFT's with no obstruction, but mild decrease in DLCO ( 14.27 or 62% predicted) Past Medical History:  Diagnosis Date  . Arthritis   . Atrial fibrillation ([place])   . Diabetes mellitus type II, controlled ([place])    x 15 yrs  . Elevated troponin   . Hemorrhoids   . Hypertension      Allergies  Allergen Reactions  . Penicillins Rash     Current Outpatient Medications  Medication Sig Dispense Refill  . atorvastatin (LIPITOR) 20 MG tablet TAKE ONE (1) TABLET EACH DAY 6PM 30 tablet 6  . calcium citrate-vitamin D (CITRACAL+D) 315-200 MG-UNIT per tablet Take 1 tablet by mouth daily.     . cholecalciferol (VITAMIN D) 400 units TABS tablet Take 400 Units by mouth daily.    Marland Kitchen diltiazem (CARDIZEM CD) 180 MG 24 hr capsule TAKE ONE (1) CAPSULE EACH DAY 90 capsule 1  . furosemide (LASIX) 40 MG tablet TAKE 1 TABLET DAILY AS NEEDED FOR SWELLING 90 tablet 1  . Insulin Aspart Prot & Aspart (NOVOLOG MIX 70/30 [place]) Inject into the skin. 25 in morning and 25 at night.    . metoprolol tartrate (LOPRESSOR) 50 MG tablet Take 75 mg by mouth 2 (two) times daily.    Marland Kitchen warfarin (COUMADIN) 5 MG tablet Take 1 tablet (5 mg total) by mouth daily. Or as directed 45 tablet 3   No current facility-administered medications for this visit.    Facility-Administered Medications Ordered in Other Visits  Medication Dose Route Frequency Provider Last  Rate Last Dose  . neomycin-polymyxin-dexameth (MAXITROL) 0.1 % ophth ointment    PRN Tonny Ashtyn Freilich, MD   1 application at 78/29/56 1427     Past Surgical History:  Procedure Laterality Date  . [place]   [facility]  . CATARACT EXTRACTION W/PHACO  01/11/2011   Procedure: CATARACT EXTRACTION PHACO AND INTRAOCULAR LENS PLACEMENT (IOC);  Surgeon: Tonny Rosalva Neary;  Location: [facility];  Service: Ophthalmology;  Laterality: Right;  CDE 19.32  . CATARACT EXTRACTION W/PHACO  01/21/2011   Procedure: CATARACT EXTRACTION PHACO AND INTRAOCULAR LENS PLACEMENT (IOC);  Surgeon: Tonny Lasaundra Riche;  Location: [facility];  Service: Ophthalmology;  Laterality: Left;  CDE: 21.19  . COLONOSCOPY N/A 09/12/2013   Procedure: COLONOSCOPY;  Surgeon: Rogene Houston, MD;  Location: [facility];  Service: Endoscopy;  Laterality: N/A;  125  . CYSTOSCOPY     [name]  . LITHOTRIPSY     [facility]  . PARTIAL HYSTERECTOMY       Allergies  Allergen Reactions  . Penicillins Rash      Family History  Problem Relation Age of Onset  . Colon cancer Brother      Social History Miranda Garcia reports that she has never smoked. She has never used smokeless tobacco. Miranda Garcia reports no history of alcohol use.  Review of Systems CONSTITUTIONAL: No weight loss, fever, chills, weakness or fatigue.  HEENT: Eyes: No visual loss, blurred vision, double vision or yellow sclerae.No hearing loss, sneezing, congestion, runny nose or sore throat.  SKIN: No rash or itching.  CARDIOVASCULAR: per hpi RESPIRATORY: per hpi GASTROINTESTINAL: No anorexia, nausea, vomiting or diarrhea. No abdominal pain or blood.  GENITOURINARY: No burning on urination, no polyuria NEUROLOGICAL: No headache, dizziness, syncope, paralysis, ataxia, numbness or tingling in the extremities. No change in bowel or bladder control.  MUSCULOSKELETAL: No muscle, back pain, joint pain or stiffness.  LYMPHATICS: No enlarged nodes. No history of splenectomy.    PSYCHIATRIC: No history of depression or anxiety.  ENDOCRINOLOGIC: No reports of sweating, cold or heat intolerance. No polyuria or polydipsia.  Marland Kitchen   Physical Examination Vitals:   04/28/18 1353  BP: (!) 142/79  Pulse: 78  SpO2: 98%   Vitals:   04/28/18 1353  Weight: 203 lb 6.4 oz (92.3 kg)  Height: 5\' 3"  (1.6 m)    Gen: resting comfortably, no acute distress HEENT: no scleral icterus, pupils equal round and reactive, no palptable cervical adenopathy,  CV: RRR, no m/r/g, no jvd Resp: Clear to auscultation bilaterally GI: abdomen is soft, non-tender, non-distended, normal bowel sounds, no hepatosplenomegaly MSK: extremities are warm, no edema.  Skin: warm, no rash Neuro:  no focal deficits Psych: appropriate affect   Diagnostic Studies 02/2012 Echo: LVEF 60-65%, mild LVH, no WMAs, grade II diastolic dysfunction, mode LAE.  02/2012 MPI:  Abnormal Lexiscan Myoview with no diagnostic electrocardiographic changes. The scintigraphic results show breast attenuation. There is also mild ischemia in the mid and basal inferolateral wall. The gated ejection fraction was 80% and the wall motion was normal.  10/2016 carotid US 1-39% bilateral disease  10/2016 echo ------------------------------------------------------------------- Study Conclusions  - Left ventricle: The cavity size was normal. Wall thickness was increased in a pattern of mild LVH. Systolic function was vigorous. The estimated ejection fraction was in the range of 65% to 70%. Wall motion was normal; there were no regional wall motion abnormalities. Doppler parameters are consistent with abnormal left ventricular relaxation (grade 1 diastolic dysfunction). Doppler parameters are consistent with high ventricular filling pressure. - Aortic valve: Mildly calcified annulus. Trileaflet; mildly thickened leaflets. Valve area (VTI): 1.56 cm^2. Valve area (Vmax): 1.7 cm^2. - Mitral valve:  Mildly calcified annulus. Mildly thickened leaflets . - Left atrium: The atrium was moderately dilated. - Technically adequate study.  03/10/2018: CT chest Review of the imaging reveals a small basilar peripheral groundglass changes to me likely represents atelectasis.  To see if this was to disappear patient would need prone imaging.   Assessment and Plan  1.SOB/Dyspnea - recent SOB/DOE of unclear etiology. Fairly mild pulmonary workup thus far - we will repeat her echo to see if any new cardiac dysfunction, pending results consider stress testing with a lexiscan.    2. Chronic diastolic HF - repeat echo    Arnoldo Lenis, M.D.

## 2018-04-28 NOTE — Telephone Encounter (Signed)
°  Precert needed for: 2 D Echo dx: SOB   Location: [facility]    Date: Feb 19 ,2020

## 2018-04-28 NOTE — Patient Instructions (Addendum)
Medication Instructions:   Your physician recommends that you continue on your current medications as directed. Please refer to the Current Medication list given to you today.  Labwork:  NONE  Testing/Procedures: Your physician has requested that you have an echocardiogram. Echocardiography is a painless test that uses sound waves to create images of your heart. It provides your doctor with information about the size and shape of your heart and how well your heart's chambers and valves are working. This procedure takes approximately one hour. There are no restrictions for this procedure.  Follow-Up:  Your physician recommends that you schedule a follow-up appointment in: pending test result.  Any Other Special Instructions Will Be Listed Below (If Applicable).  If you need a refill on your cardiac medications before your next appointment, please call your pharmacy.

## 2018-05-05 DIAGNOSIS — I10 Essential (primary) hypertension: Secondary | ICD-10-CM | POA: Diagnosis not present

## 2018-05-05 DIAGNOSIS — E1140 Type 2 diabetes mellitus with diabetic neuropathy, unspecified: Secondary | ICD-10-CM | POA: Diagnosis not present

## 2018-05-05 DIAGNOSIS — Z6837 Body mass index (BMI) 37.0-37.9, adult: Secondary | ICD-10-CM | POA: Diagnosis not present

## 2018-05-05 DIAGNOSIS — Z789 Other specified health status: Secondary | ICD-10-CM | POA: Diagnosis not present

## 2018-05-05 DIAGNOSIS — Z299 Encounter for prophylactic measures, unspecified: Secondary | ICD-10-CM | POA: Diagnosis not present

## 2018-05-05 DIAGNOSIS — N183 Chronic kidney disease, stage 3 (moderate): Secondary | ICD-10-CM | POA: Diagnosis not present

## 2018-05-05 DIAGNOSIS — J449 Chronic obstructive pulmonary disease, unspecified: Secondary | ICD-10-CM | POA: Diagnosis not present

## 2018-05-10 ENCOUNTER — Ambulatory Visit (INDEPENDENT_AMBULATORY_CARE_PROVIDER_SITE_OTHER): Payer: Medicare HMO

## 2018-05-10 DIAGNOSIS — R0600 Dyspnea, unspecified: Secondary | ICD-10-CM

## 2018-05-11 DIAGNOSIS — I4891 Unspecified atrial fibrillation: Secondary | ICD-10-CM | POA: Diagnosis not present

## 2018-05-11 DIAGNOSIS — E119 Type 2 diabetes mellitus without complications: Secondary | ICD-10-CM | POA: Diagnosis not present

## 2018-05-11 DIAGNOSIS — I10 Essential (primary) hypertension: Secondary | ICD-10-CM | POA: Diagnosis not present

## 2018-05-16 DIAGNOSIS — Z299 Encounter for prophylactic measures, unspecified: Secondary | ICD-10-CM | POA: Diagnosis not present

## 2018-05-16 DIAGNOSIS — Z6837 Body mass index (BMI) 37.0-37.9, adult: Secondary | ICD-10-CM | POA: Diagnosis not present

## 2018-05-16 DIAGNOSIS — R69 Illness, unspecified: Secondary | ICD-10-CM | POA: Diagnosis not present

## 2018-05-16 DIAGNOSIS — I4891 Unspecified atrial fibrillation: Secondary | ICD-10-CM | POA: Diagnosis not present

## 2018-05-17 ENCOUNTER — Telehealth: Payer: Self-pay | Admitting: *Deleted

## 2018-05-17 DIAGNOSIS — R69 Illness, unspecified: Secondary | ICD-10-CM | POA: Diagnosis not present

## 2018-05-17 DIAGNOSIS — R0600 Dyspnea, unspecified: Secondary | ICD-10-CM

## 2018-05-17 NOTE — Telephone Encounter (Signed)
LM to return call.

## 2018-05-17 NOTE — Telephone Encounter (Signed)
-----   Message from Arnoldo Lenis, MD sent at 05/16/2018 12:44 PM EST ----- Echo overall looks good, no major abnormalities that would cause symptoms. Can we order a lexiscan for SOB, does not need to hold any meds  J BrancH MD

## 2018-05-18 ENCOUNTER — Encounter: Payer: Self-pay | Admitting: *Deleted

## 2018-05-18 ENCOUNTER — Telehealth: Payer: Self-pay | Admitting: Cardiology

## 2018-05-18 NOTE — Telephone Encounter (Signed)
-----   Message from Arnoldo Lenis, MD sent at 05/16/2018 12:44 PM EST ----- Echo overall looks good, no major abnormalities that would cause symptoms. Can we order a lexiscan for SOB, does not need to hold any meds  J BrancH MD

## 2018-05-18 NOTE — Telephone Encounter (Signed)
This came to triage.

## 2018-05-18 NOTE — Telephone Encounter (Signed)
Patient informed and verbalized understanding of plan. 

## 2018-05-18 NOTE — Telephone Encounter (Signed)
Pre-cert Verification for the following procedure   Lexiscan Myoview scheduled 05/23/2018 at [facility]

## 2018-05-23 ENCOUNTER — Encounter (HOSPITAL_COMMUNITY): Payer: Self-pay

## 2018-05-23 ENCOUNTER — Encounter (HOSPITAL_COMMUNITY)
Admission: RE | Admit: 2018-05-23 | Discharge: 2018-05-23 | Disposition: A | Discharge status: Home / Self Care | Payer: Medicare HMO | Source: Ambulatory Visit | Attending: Cardiology | Admitting: Cardiology

## 2018-05-23 ENCOUNTER — Encounter (HOSPITAL_BASED_OUTPATIENT_CLINIC_OR_DEPARTMENT_OTHER)
Admission: RE | Admit: 2018-05-23 | Discharge: 2018-05-23 | Disposition: A | Discharge status: Home / Self Care | Payer: Medicare HMO | Source: Ambulatory Visit | Attending: Cardiology | Admitting: Cardiology

## 2018-05-23 DIAGNOSIS — R0600 Dyspnea, unspecified: Secondary | ICD-10-CM | POA: Insufficient documentation

## 2018-05-23 HISTORY — DX: Disorder of kidney and ureter, unspecified: N28.9

## 2018-05-23 LAB — NM MYOCAR MULTI W/SPECT W/WALL MOTION / EF
CHL CUP NUCLEAR SSS: 4
CSEPPHR: 86 {beats}/min
LV dias vol: 64 mL (ref 46–106)
LVSYSVOL: 22 mL
RATE: 0.31
Rest HR: 82 {beats}/min
SDS: 1
SRS: 3
TID: 1.37

## 2018-05-23 MED ORDER — SODIUM CHLORIDE 0.9% FLUSH
INTRAVENOUS | Status: AC
  Administered 2018-05-23: 10 mL via INTRAVENOUS
  Filled 2018-05-23: qty 10

## 2018-05-23 MED ORDER — TECHNETIUM TC 99M TETROFOSMIN IV KIT
10.0000 | PACK | Freq: Once | INTRAVENOUS | Status: AC | PRN
  Administered 2018-05-23: 10.2 via INTRAVENOUS

## 2018-05-23 MED ORDER — TECHNETIUM TC 99M TETROFOSMIN IV KIT
30.0000 | PACK | Freq: Once | INTRAVENOUS | Status: AC | PRN
  Administered 2018-05-23: 31.2 via INTRAVENOUS

## 2018-05-23 MED ORDER — REGADENOSON 0.4 MG/5ML IV SOLN
INTRAVENOUS | Status: AC
  Administered 2018-05-23: 0.4 mg via INTRAVENOUS
  Filled 2018-05-23: qty 5

## 2018-05-25 ENCOUNTER — Telehealth: Payer: Self-pay | Admitting: Cardiology

## 2018-05-25 NOTE — Telephone Encounter (Signed)
Daughter advised that there is no signed DPR in chart and that patient would be contacted with results of stress test.

## 2018-05-25 NOTE — Telephone Encounter (Signed)
Patient's daughter called requesting test results of recent stress test. Please call Katharine Look [phone]  work

## 2018-05-26 ENCOUNTER — Telehealth: Payer: Self-pay | Admitting: *Deleted

## 2018-05-26 NOTE — Telephone Encounter (Signed)
Pt aware - routed to pcp  

## 2018-05-26 NOTE — Telephone Encounter (Signed)
error 

## 2018-05-26 NOTE — Telephone Encounter (Signed)
-----   Message from Arnoldo Lenis, MD sent at 05/25/2018  9:04 AM EST ----- Stress test looks good, no evidence of any significant blockages. We will discuss further at our f/u later this month  Zandra Abts MD

## 2018-05-29 ENCOUNTER — Emergency Department (HOSPITAL_COMMUNITY)
Admission: EM | Admit: 2018-05-29 | Discharge: 2018-05-29 | Disposition: A | Discharge status: Home / Self Care | Payer: Medicare HMO | Attending: Emergency Medicine | Admitting: Emergency Medicine

## 2018-05-29 ENCOUNTER — Emergency Department (HOSPITAL_COMMUNITY): Payer: Medicare HMO

## 2018-05-29 ENCOUNTER — Other Ambulatory Visit: Payer: Self-pay

## 2018-05-29 ENCOUNTER — Encounter (HOSPITAL_COMMUNITY): Payer: Self-pay | Admitting: Emergency Medicine

## 2018-05-29 DIAGNOSIS — Z7901 Long term (current) use of anticoagulants: Secondary | ICD-10-CM | POA: Insufficient documentation

## 2018-05-29 DIAGNOSIS — R0600 Dyspnea, unspecified: Secondary | ICD-10-CM

## 2018-05-29 DIAGNOSIS — I130 Hypertensive heart and chronic kidney disease with heart failure and stage 1 through stage 4 chronic kidney disease, or unspecified chronic kidney disease: Secondary | ICD-10-CM | POA: Insufficient documentation

## 2018-05-29 DIAGNOSIS — Z794 Long term (current) use of insulin: Secondary | ICD-10-CM | POA: Diagnosis not present

## 2018-05-29 DIAGNOSIS — I4891 Unspecified atrial fibrillation: Secondary | ICD-10-CM | POA: Diagnosis not present

## 2018-05-29 DIAGNOSIS — E1122 Type 2 diabetes mellitus with diabetic chronic kidney disease: Secondary | ICD-10-CM | POA: Insufficient documentation

## 2018-05-29 DIAGNOSIS — N183 Chronic kidney disease, stage 3 (moderate): Secondary | ICD-10-CM | POA: Diagnosis not present

## 2018-05-29 DIAGNOSIS — R042 Hemoptysis: Secondary | ICD-10-CM

## 2018-05-29 DIAGNOSIS — Z880 Allergy status to penicillin: Secondary | ICD-10-CM | POA: Diagnosis not present

## 2018-05-29 DIAGNOSIS — I5032 Chronic diastolic (congestive) heart failure: Secondary | ICD-10-CM | POA: Insufficient documentation

## 2018-05-29 DIAGNOSIS — R0602 Shortness of breath: Secondary | ICD-10-CM | POA: Diagnosis not present

## 2018-05-29 DIAGNOSIS — Z79899 Other long term (current) drug therapy: Secondary | ICD-10-CM | POA: Diagnosis not present

## 2018-05-29 DIAGNOSIS — R918 Other nonspecific abnormal finding of lung field: Secondary | ICD-10-CM | POA: Diagnosis not present

## 2018-05-29 LAB — CBC WITH DIFFERENTIAL/PLATELET
Abs Immature Granulocytes: 0.04 10*3/uL (ref 0.00–0.07)
Basophils Absolute: 0 10*3/uL (ref 0.0–0.1)
Basophils Relative: 1 %
EOS ABS: 0.1 10*3/uL (ref 0.0–0.5)
Eosinophils Relative: 1 %
HEMATOCRIT: 39.3 % (ref 36.0–46.0)
Hemoglobin: 11.9 g/dL — ABNORMAL LOW (ref 12.0–15.0)
Immature Granulocytes: 1 %
Lymphocytes Relative: 21 %
Lymphs Abs: 1.8 10*3/uL (ref 0.7–4.0)
MCH: 29.7 pg (ref 26.0–34.0)
MCHC: 30.3 g/dL (ref 30.0–36.0)
MCV: 98 fL (ref 80.0–100.0)
Monocytes Absolute: 1 10*3/uL (ref 0.1–1.0)
Monocytes Relative: 11 %
Neutro Abs: 5.6 10*3/uL (ref 1.7–7.7)
Neutrophils Relative %: 65 %
Platelets: 163 10*3/uL (ref 150–400)
RBC: 4.01 MIL/uL (ref 3.87–5.11)
RDW: 13.9 % (ref 11.5–15.5)
WBC: 8.5 10*3/uL (ref 4.0–10.5)
nRBC: 0 % (ref 0.0–0.2)

## 2018-05-29 LAB — BASIC METABOLIC PANEL
Anion gap: 7 (ref 5–15)
BUN: 31 mg/dL — ABNORMAL HIGH (ref 8–23)
CO2: 25 mmol/L (ref 22–32)
Calcium: 9.1 mg/dL (ref 8.9–10.3)
Chloride: 110 mmol/L (ref 98–111)
Creatinine, Ser: 1.62 mg/dL — ABNORMAL HIGH (ref 0.44–1.00)
GFR calc Af Amer: 33 mL/min — ABNORMAL LOW (ref >60)
GFR calc non Af Amer: 29 mL/min — ABNORMAL LOW (ref >60)
Glucose, Bld: 64 mg/dL — ABNORMAL LOW (ref 70–99)
Potassium: 4 mmol/L (ref 3.5–5.1)
Sodium: 142 mmol/L (ref 135–145)

## 2018-05-29 LAB — PROTIME-INR
INR: 1.9 — ABNORMAL HIGH (ref 0.8–1.2)
Prothrombin Time: 21.9 seconds — ABNORMAL HIGH (ref 11.4–15.2)

## 2018-05-29 LAB — BRAIN NATRIURETIC PEPTIDE: B Natriuretic Peptide: 488 pg/mL — ABNORMAL HIGH (ref 0.0–100.0)

## 2018-05-29 MED ORDER — TECHNETIUM TC 99M DIETHYLENETRIAME-PENTAACETIC ACID
30.0000 | Freq: Once | INTRAVENOUS | Status: AC | PRN
  Administered 2018-05-29: 29.5 via RESPIRATORY_TRACT

## 2018-05-29 MED ORDER — TECHNETIUM TO 99M ALBUMIN AGGREGATED
4.0000 | Freq: Once | INTRAVENOUS | Status: AC | PRN
  Administered 2018-05-29: 4 via INTRAVENOUS

## 2018-05-29 NOTE — Discharge Instructions (Addendum)
Hold coumadin dose for 2 days and discuss further with your doctor. Return for increased hemoptysis, feeling like he cannot pass out, syncope, worsening shortness of breath or new concerns

## 2018-05-29 NOTE — ED Triage Notes (Signed)
Last to morning, pt wakes up feeling like she is chocking,  And coughs up blood.  Pt has seen Heart and Lung doctor and many test done. These symptoms are new.

## 2018-05-29 NOTE — ED Notes (Signed)
Going for VQ scan

## 2018-05-29 NOTE — ED Notes (Signed)
Called [facility], they ordered the dose and will do VQ scan in a little while.

## 2018-05-29 NOTE — ED Notes (Signed)
Pt eating and drinking, waiting on discharge instruction from EDP

## 2018-05-29 NOTE — ED Provider Notes (Signed)
[facility] EMERGENCY DEPARTMENT Provider Note   CSN: [id number] Arrival date & time: 05/29/18  1013    History   Chief Complaint Chief Complaint  Patient presents with  . Hemoptysis    HPI Miranda Garcia is a 85 y.o. female.     Patient with history of atrial fibrillation on Coumadin compliant, diabetic controlled, diastolic heart failure presents with dyspnea and hemoptysis.  Patient's had intermittent dyspnea gradually worsening the past few months.  Patient is followed up with pulmonology and cardiology.  Patient had recent echo and pulmonary function test.  Patient denies lung disease diagnosis.  No fevers or chills no productive cough.     Past Medical History:  Diagnosis Date  . Arthritis   . Atrial fibrillation ([place])   . Diabetes mellitus type II, controlled ([place])    x 15 yrs  . Elevated troponin   . Hemorrhoids   . Hypertension   . Renal insufficiency     Patient Active Problem List   Diagnosis Date Noted  . Dyspnea 04/17/2018  . Pulmonary nodules 04/17/2018  . Heme positive stool 08/23/2013  . Long term (current) use of anticoagulants 03/03/2012  . Chronic diastolic CHF (congestive heart failure) ([place]) 02/28/2012  . Hypomagnesemia 02/26/2012  . Elevated troponin 02/26/2012  . Congestive heart failure ([place]) 02/26/2012  . Atrial fibrillation ([place]) 02/25/2012  . Chest pain 02/25/2012  . CKD (chronic kidney disease) stage 3, GFR 30-59 ml/min (HCC) 02/25/2012  . Leukocytosis 02/25/2012  . Anemia due to chronic illness 02/25/2012  . Diabetes ([place]) 02/25/2012    Past Surgical History:  Procedure Laterality Date  . [place]   [facility]  . CATARACT EXTRACTION W/PHACO  01/11/2011   Procedure: CATARACT EXTRACTION PHACO AND INTRAOCULAR LENS PLACEMENT (IOC);  Surgeon: Tonny Branch;  Location: [facility] ORS;  Service: Ophthalmology;  Laterality: Right;  CDE 19.32  . CATARACT EXTRACTION W/PHACO  01/21/2011   Procedure: CATARACT EXTRACTION PHACO AND INTRAOCULAR  LENS PLACEMENT (IOC);  Surgeon: Tonny Branch;  Location: [facility] ORS;  Service: Ophthalmology;  Laterality: Left;  CDE: 21.19  . COLONOSCOPY N/A 09/12/2013   Procedure: COLONOSCOPY;  Surgeon: Rogene Houston, MD;  Location: [facility] ENDO SUITE;  Service: Endoscopy;  Laterality: N/A;  125  . CYSTOSCOPY     [name]  . LITHOTRIPSY     [facility]  . PARTIAL HYSTERECTOMY       OB History   No obstetric history on file.      Home Medications    Prior to Admission medications   Medication Sig Start Date End Date Taking? Authorizing Provider  atorvastatin (LIPITOR) 20 MG tablet TAKE ONE (1) TABLET EACH DAY 6PM 03/14/15  Yes Branch, Alphonse Guild, MD  calcium citrate-vitamin D (CITRACAL+D) 315-200 MG-UNIT per tablet Take 1 tablet by mouth daily.    Yes [provider]  diltiazem (CARDIZEM CD) 180 MG 24 hr capsule TAKE ONE (1) CAPSULE EACH DAY 12/07/17  Yes Branch, Alphonse Guild, MD  furosemide (LASIX) 40 MG tablet TAKE 1 TABLET DAILY AS NEEDED FOR SWELLING 03/06/18  Yes Branch, Alphonse Guild, MD  Insulin Aspart Prot & Aspart (NOVOLOG MIX 70/30 [place]) Inject into the skin. 25 in morning and 25 at night.   Yes [provider]  metoprolol tartrate (LOPRESSOR) 50 MG tablet Take 75 mg by mouth 2 (two) times daily.   Yes [provider]  warfarin (COUMADIN) 5 MG tablet Take 1 tablet (5 mg total) by mouth daily. Or as directed Patient taking  differently: Take 2.5-5 mg by mouth daily. 1 Sunday Wednesday Friday and 1/2 other days 03/07/12  Yes Larey Dresser, MD    Family History Family History  Problem Relation Age of Onset  . Colon cancer Brother     Social History Social History   Tobacco Use  . Smoking status: Never Smoker  . Smokeless tobacco: Never Used  Substance Use Topics  . Alcohol use: No    Alcohol/week: 0.0 standard drinks  . Drug use: No     Allergies   Contrast media [iodinated diagnostic agents] and Penicillins   Review of Systems Review of Systems  Constitutional:  Negative for chills and fever.  HENT: Negative for congestion.   Eyes: Negative for visual disturbance.  Respiratory: Positive for shortness of breath.   Cardiovascular: Negative for chest pain.  Gastrointestinal: Negative for abdominal pain and vomiting.  Genitourinary: Negative for dysuria and flank pain.  Musculoskeletal: Negative for back pain, neck pain and neck stiffness.  Skin: Negative for rash.  Neurological: Negative for light-headedness and headaches.     Physical Exam Updated Vital Signs BP (!) 143/86 (BP Location: Right Arm)   Pulse 75   Temp 98.3 F (36.8 C) (Oral)   Resp 16   Ht 5\' 3"  (1.6 m)   Wt 92.1 kg   SpO2 99%   BMI 35.96 kg/m   Physical Exam Vitals signs and nursing note reviewed.  Constitutional:      Appearance: She is well-developed.  HENT:     Head: Normocephalic and atraumatic.  Eyes:     General:        Right eye: No discharge.        Left eye: No discharge.     Conjunctiva/sclera: Conjunctivae normal.  Neck:     Musculoskeletal: Normal range of motion and neck supple.     Trachea: No tracheal deviation.  Cardiovascular:     Rate and Rhythm: Normal rate and regular rhythm.  Pulmonary:     Effort: Pulmonary effort is normal.     Breath sounds: Normal breath sounds.  Abdominal:     General: There is no distension.     Palpations: Abdomen is soft.     Tenderness: There is no abdominal tenderness. There is no guarding.  Skin:    General: Skin is warm.     Findings: No rash.  Neurological:     Mental Status: She is alert and oriented to person, place, and time.      ED Treatments / Results  Labs (all labs ordered are listed, but only abnormal results are displayed) Labs Reviewed  PROTIME-INR - Abnormal; Notable for the following components:      Result Value   Prothrombin Time 21.9 (*)    INR 1.9 (*)    All other components within normal limits  BRAIN NATRIURETIC PEPTIDE - Abnormal; Notable for the following components:   B  Natriuretic Peptide 488.0 (*)    All other components within normal limits  CBC WITH DIFFERENTIAL/PLATELET - Abnormal; Notable for the following components:   Hemoglobin 11.9 (*)    All other components within normal limits  BASIC METABOLIC PANEL - Abnormal; Notable for the following components:   Glucose, Bld 64 (*)    BUN 31 (*)    Creatinine, Ser 1.62 (*)    GFR calc non Af Amer 29 (*)    GFR calc Af Amer 33 (*)    All other components within normal limits    EKG None  Radiology Dg Chest 2 View  Result Date: 05/29/2018 CLINICAL DATA:  Hemoptysis. EXAM: CHEST - 2 VIEW COMPARISON:  Chest CT dated 03/10/2018 and chest x-ray dated 02/22/2018 FINDINGS: Chronic cardiomegaly. Aortic atherosclerosis. Pulmonary vascularity is normal. There is minimal accentuation of the interstitial markings at the lung bases. The multiple small pulmonary nodules noted on prior CT scans are not appreciable on this chest x-ray. No significant bone abnormality. IMPRESSION: No acute abnormalities.  Chronic cardiomegaly. Aortic Atherosclerosis (ICD10-I70.0). Electronically Signed   By: Lorriane Shire M.D.   On: 05/29/2018 11:52   Ct Chest High Resolution  Result Date: 05/29/2018 CLINICAL DATA:  85 year old female with history of respiratory distress. Hemoptysis. EXAM: CT CHEST WITHOUT CONTRAST TECHNIQUE: Multidetector CT imaging of the chest was performed following the standard protocol without intravenous contrast. High resolution imaging of the lungs, as well as inspiratory and expiratory imaging, was performed. COMPARISON:  Chest CT 03/10/2018. FINDINGS: Cardiovascular: Heart size is mildly enlarged. There is no significant pericardial fluid, thickening or pericardial calcification. There is aortic atherosclerosis, as well as atherosclerosis of the great vessels of the mediastinum and the coronary arteries, including calcified atherosclerotic plaque in the left anterior descending, left circumflex and right coronary  arteries. Calcifications of the aortic valve. Severe calcifications of the mitral annulus. Mediastinum/Nodes: No pathologically enlarged mediastinal or hilar lymph nodes. Please note that accurate exclusion of hilar adenopathy is limited on noncontrast CT scans. Esophagus is unremarkable in appearance. No axillary lymphadenopathy. Lungs/Pleura: Large pulmonary nodule in the left lower lobe (axial image 108 of series 7) measuring 1.5 x 1.2 cm, unchanged. 6 mm left lower lobe pulmonary nodule (axial image 83 of series 7), also unchanged. Several other small pulmonary nodules are also throughout the lungs bilaterally, largest of which is in the apex of the left upper lobe measuring 5 mm (axial image 22 of series 7), unchanged. As noted on the prior examination there are areas of ground-glass attenuation as well as inter- and intralobular septal thickening (creating a crazy paving appearance) throughout the lung bases (right greater than left). This has increased slightly compared to the prior study, particularly in the left lung base which was relatively uninvolved on the prior. No associated traction bronchiectasis. No honeycombing. Inspiratory and expiratory imaging is unremarkable. No pleural effusions. Upper Abdomen: Aortic atherosclerosis. Mild atrophy of the left kidney. Musculoskeletal: There are no aggressive appearing lytic or blastic lesions noted in the visualized portions of the skeleton. IMPRESSION: 1. There are findings in the lungs which can be seen in the setting of interstitial lung disease, however, given the patient's history of hemoptysis, these bibasilar findings may alternatively simply reflect areas of alveolar hemorrhage. If there is persistent clinical concern for interstitial lung disease, repeat high-resolution chest CT would be recommended in 6-12 months to assess for temporal changes in the appearance of the lung parenchyma. At this time, these findings are considered indeterminate for  usual interstitial pneumonia (UIP) per current ATS guidelines. 2. Multiple small pulmonary nodules scattered throughout both lungs, similar in size, number and distribution to the prior study. This is reassuring, but close attention at time of repeat high-resolution chest CT is recommended to ensure continued stability of these findings. 3. Cardiomegaly. 4. Aortic atherosclerosis, in addition to 3 vessel coronary artery disease. 5. There are calcifications of the aortic valve and mitral annulus. Echocardiographic correlation for evaluation of potential valvular dysfunction may be warranted if clinically indicated. Aortic Atherosclerosis (ICD10-I70.0). Electronically Signed   By: Vinnie Langton M.D.   On: 05/29/2018  12:49   Nm Pulmonary Vent And Perf (v/q Scan)  Result Date: 05/29/2018 CLINICAL DATA:  Shortness of breath for 6 months.  Hemoptysis. EXAM: NUCLEAR MEDICINE VENTILATION - PERFUSION LUNG SCAN TECHNIQUE: Ventilation images were obtained in multiple projections using inhaled aerosol Tc-25m DTPA. Perfusion images were obtained in multiple projections after intravenous injection of Tc-35m MAA. RADIOPHARMACEUTICALS:  29.5 mCi of Tc-21m DTPA aerosol inhalation and 4.0 mCi Tc21m MAA IV COMPARISON:  Chest CT 05/29/2018 FINDINGS: Ventilation: No focal ventilation defect. Patchy retention of the ventilation agent centrally. Perfusion: No wedge shaped peripheral perfusion defects to suggest acute pulmonary embolism. IMPRESSION: Low probability for pulmonary embolus. Patchy retention of the ventilation agent centrally may be seen with air trapping or COPD. Electronically Signed   By: Fidela Salisbury M.D.   On: 05/29/2018 15:24    Procedures Procedures (including critical care time)  Medications Ordered in ED Medications  technetium TC 36M diethylenetriame-pentaacetic acid (DTPA) injection 30 millicurie (36.1 millicuries Inhalation Given 05/29/18 1405)  technetium albumin aggregated (MAA) injection  solution 4 millicurie (4 millicuries Intravenous Contrast Given 05/29/18 1421)     Initial Impression / Assessment and Plan / ED Course  I have reviewed the triage vital signs and the nursing notes.  Pertinent labs & imaging results that were available during my care of the patient were reviewed by me and considered in my medical decision making (see chart for details).       Patient presents with worsening dyspnea and recent work-up for this.  With worsening symptoms and hemoptysis CT scan ordered without contrast due to renal function showing pulmonary nodules overall similar to previous.  Patient had VQ scan done in the emergency room which is low probability.  Patient is compliant with her Coumadin.  Plan to hold Coumadin for 2 days and follow-up closely with primary doctor and pulmonology.  Strict reasons to return given.  Hemoglobin 11.9.  Echo from approximately 3 weeks prior reviewed showing small pericardial effusion, bicuspid aortic valve and chronic changes. Patient well-appearing on reassessment.  We will follow-up with her specialists and reasons to return discussed.  Results and differential diagnosis were discussed with the patient/parent/guardian. Xrays were independently reviewed by myself.  Close follow up outpatient was discussed, comfortable with the plan.   Medications  technetium TC 36M diethylenetriame-pentaacetic acid (DTPA) injection 30 millicurie (44.3 millicuries Inhalation Given 05/29/18 1405)  technetium albumin aggregated (MAA) injection solution 4 millicurie (4 millicuries Intravenous Contrast Given 05/29/18 1421)    Vitals:   05/29/18 1100 05/29/18 1130 05/29/18 1443 05/29/18 1510  BP: 129/63 (!) 141/64 139/89 (!) 143/86  Pulse: 72 75 82 75  Resp: 20 15  16   Temp:      TempSrc:      SpO2: 95% 98% 99% 99%  Weight:      Height:        Final diagnoses:  Dyspnea, unspecified type  Hemoptysis    Final Clinical Impressions(s) / ED Diagnoses   Final  diagnoses:  Dyspnea, unspecified type  Hemoptysis    ED Discharge Orders    None       Elnora Morrison, MD 05/29/18 1612

## 2018-05-29 NOTE — ED Notes (Signed)
Patient given discharge instruction, verbalized understand. IV removed, band aid applied. Patient ambulatory out of the department.  

## 2018-05-31 ENCOUNTER — Other Ambulatory Visit: Payer: Self-pay

## 2018-05-31 ENCOUNTER — Encounter: Payer: Self-pay | Admitting: Primary Care

## 2018-05-31 ENCOUNTER — Ambulatory Visit: Payer: Medicare HMO | Admitting: Primary Care

## 2018-05-31 ENCOUNTER — Other Ambulatory Visit: Payer: Self-pay | Admitting: Cardiology

## 2018-05-31 VITALS — BP 110/64 | HR 82 | Ht 63.0 in | Wt 207.4 lb

## 2018-05-31 DIAGNOSIS — R918 Other nonspecific abnormal finding of lung field: Secondary | ICD-10-CM | POA: Diagnosis not present

## 2018-05-31 DIAGNOSIS — R042 Hemoptysis: Secondary | ICD-10-CM

## 2018-05-31 NOTE — Progress Notes (Deleted)
@Patient  ID: Miranda Garcia, female    DOB: Feb 12, 1931, 85 y.o.   MRN: [id number]  Chief Complaint  Patient presents with   Follow-up    hemoptysis x2, last monday, SOB, wheezing    Referring provider: Glenda Chroman, MD  HPI: 85 year old female, never smoked. PMH significant for afib, CHF, CKD, diabetes, pulmonary nodules. Patient of Dr. Valeta Harms, seen for initial consult on 03/31/18.   05/31/2018 Patient presents today for an acute visit with complaints of hemoptysis x 2 days. Accompanied by her daughter. She went to the ED for evaluation on 05/29/18. CTA negative for PE, unchanged large pulmonary nodule in LLL. She was instructed to hold her coumadin for two days. She has not had any further episodes of hemoptysis.    Imaging: 05/29/18- CTA showed unchanged large pulmonary nodule in the LLL mesauring 1.5 x 1.2 cm and 70mm LLL nodule. Several other small pulmonary nodules throughout the lungs bilaterally. Areas of ground-glass attenuation. NO honeycombing.      Allergies  Allergen Reactions   Contrast Media [Iodinated Diagnostic Agents]     Pt has 1 full functioning kidney   Penicillins Rash    Immunization History  Administered Date(s) Administered   Influenza, High Dose Seasonal PF 12/29/2017   Influenza,inj,Quad PF,6+ Mos 12/25/2013, 12/30/2015   Influenza,inj,quad, With Preservative 02/07/2017    Past Medical History:  Diagnosis Date   Arthritis    Atrial fibrillation ([place])    Diabetes mellitus type II, controlled ([place])    x 15 yrs   Elevated troponin    Hemorrhoids    Hypertension    Renal insufficiency     Tobacco History: Social History   Tobacco Use  Smoking Status Never Smoker  Smokeless Tobacco Never Used   Counseling given: Not Answered   Outpatient Medications Prior to Visit  Medication Sig Dispense Refill   atorvastatin (LIPITOR) 20 MG tablet TAKE ONE (1) TABLET EACH DAY 6PM 30 tablet 6   calcium citrate-vitamin D (CITRACAL+D)  315-200 MG-UNIT per tablet Take 1 tablet by mouth daily.      diltiazem (CARDIZEM CD) 180 MG 24 hr capsule TAKE ONE (1) CAPSULE EACH DAY 90 capsule 1   furosemide (LASIX) 40 MG tablet TAKE 1 TABLET DAILY AS NEEDED FOR SWELLING 90 tablet 1   Insulin Aspart Prot & Aspart (NOVOLOG MIX 70/30 [place]) Inject into the skin. 25 in morning and 25 at night.     metoprolol tartrate (LOPRESSOR) 50 MG tablet Take 75 mg by mouth 2 (two) times daily.     warfarin (COUMADIN) 5 MG tablet Take 1 tablet (5 mg total) by mouth daily. Or as directed (Patient taking differently: Take 2.5-5 mg by mouth daily. 1 Sunday Wednesday Friday and 1/2 other days) 45 tablet 3   Facility-Administered Medications Prior to Visit  Medication Dose Route Frequency Provider Last Rate Last Dose   neomycin-polymyxin-dexameth (MAXITROL) 0.1 % ophth ointment    PRN Tonny Branch, MD   1 application at 02/77/41 1427      Review of Systems  Review of Systems   Physical Exam  BP 110/64 (BP Location: Left Arm, Cuff Size: Normal)    Pulse 82    Ht 5\' 3"  (1.6 m)    Wt 207 lb 6.4 oz (94.1 kg)    SpO2 96%    BMI 36.74 kg/m  Physical Exam   Lab Results:  CBC    Component Value Date/Time   WBC 8.5 05/29/2018 1217   RBC 4.01  05/29/2018 1217   HGB 11.9 (L) 05/29/2018 1217   HCT 39.3 05/29/2018 1217   PLT 163 05/29/2018 1217   MCV 98.0 05/29/2018 1217   MCH 29.7 05/29/2018 1217   MCHC 30.3 05/29/2018 1217   RDW 13.9 05/29/2018 1217   LYMPHSABS 1.8 05/29/2018 1217   MONOABS 1.0 05/29/2018 1217   EOSABS 0.1 05/29/2018 1217   BASOSABS 0.0 05/29/2018 1217    BMET    Component Value Date/Time   NA 142 05/29/2018 1217   K 4.0 05/29/2018 1217   CL 110 05/29/2018 1217   CO2 25 05/29/2018 1217   GLUCOSE 64 (L) 05/29/2018 1217   BUN 31 (H) 05/29/2018 1217   CREATININE 1.62 (H) 05/29/2018 1217   CALCIUM 9.1 05/29/2018 1217   GFRNONAA 29 (L) 05/29/2018 1217   GFRAA 33 (L) 05/29/2018 1217    BNP    Component Value Date/Time     BNP 488.0 (H) 05/29/2018 1217    ProBNP No results found for: PROBNP  Imaging: Dg Chest 2 View  Result Date: 05/29/2018 CLINICAL DATA:  Hemoptysis. EXAM: CHEST - 2 VIEW COMPARISON:  Chest CT dated 03/10/2018 and chest x-ray dated 02/22/2018 FINDINGS: Chronic cardiomegaly. Aortic atherosclerosis. Pulmonary vascularity is normal. There is minimal accentuation of the interstitial markings at the lung bases. The multiple small pulmonary nodules noted on prior CT scans are not appreciable on this chest x-ray. No significant bone abnormality. IMPRESSION: No acute abnormalities.  Chronic cardiomegaly. Aortic Atherosclerosis (ICD10-I70.0). Electronically Signed   By: Lorriane Shire M.D.   On: 05/29/2018 11:52   Ct Chest High Resolution  Result Date: 05/29/2018 CLINICAL DATA:  85 year old female with history of respiratory distress. Hemoptysis. EXAM: CT CHEST WITHOUT CONTRAST TECHNIQUE: Multidetector CT imaging of the chest was performed following the standard protocol without intravenous contrast. High resolution imaging of the lungs, as well as inspiratory and expiratory imaging, was performed. COMPARISON:  Chest CT 03/10/2018. FINDINGS: Cardiovascular: Heart size is mildly enlarged. There is no significant pericardial fluid, thickening or pericardial calcification. There is aortic atherosclerosis, as well as atherosclerosis of the great vessels of the mediastinum and the coronary arteries, including calcified atherosclerotic plaque in the left anterior descending, left circumflex and right coronary arteries. Calcifications of the aortic valve. Severe calcifications of the mitral annulus. Mediastinum/Nodes: No pathologically enlarged mediastinal or hilar lymph nodes. Please note that accurate exclusion of hilar adenopathy is limited on noncontrast CT scans. Esophagus is unremarkable in appearance. No axillary lymphadenopathy. Lungs/Pleura: Large pulmonary nodule in the left lower lobe (axial image 108 of  series 7) measuring 1.5 x 1.2 cm, unchanged. 6 mm left lower lobe pulmonary nodule (axial image 83 of series 7), also unchanged. Several other small pulmonary nodules are also throughout the lungs bilaterally, largest of which is in the apex of the left upper lobe measuring 5 mm (axial image 22 of series 7), unchanged. As noted on the prior examination there are areas of ground-glass attenuation as well as inter- and intralobular septal thickening (creating a crazy paving appearance) throughout the lung bases (right greater than left). This has increased slightly compared to the prior study, particularly in the left lung base which was relatively uninvolved on the prior. No associated traction bronchiectasis. No honeycombing. Inspiratory and expiratory imaging is unremarkable. No pleural effusions. Upper Abdomen: Aortic atherosclerosis. Mild atrophy of the left kidney. Musculoskeletal: There are no aggressive appearing lytic or blastic lesions noted in the visualized portions of the skeleton. IMPRESSION: 1. There are findings in the lungs which  can be seen in the setting of interstitial lung disease, however, given the patient's history of hemoptysis, these bibasilar findings may alternatively simply reflect areas of alveolar hemorrhage. If there is persistent clinical concern for interstitial lung disease, repeat high-resolution chest CT would be recommended in 6-12 months to assess for temporal changes in the appearance of the lung parenchyma. At this time, these findings are considered indeterminate for usual interstitial pneumonia (UIP) per current ATS guidelines. 2. Multiple small pulmonary nodules scattered throughout both lungs, similar in size, number and distribution to the prior study. This is reassuring, but close attention at time of repeat high-resolution chest CT is recommended to ensure continued stability of these findings. 3. Cardiomegaly. 4. Aortic atherosclerosis, in addition to 3 vessel coronary  artery disease. 5. There are calcifications of the aortic valve and mitral annulus. Echocardiographic correlation for evaluation of potential valvular dysfunction may be warranted if clinically indicated. Aortic Atherosclerosis (ICD10-I70.0). Electronically Signed   By: Vinnie Langton M.D.   On: 05/29/2018 12:49   Nm Myocar Multi W/spect W/wall Motion / Ef  Result Date: 05/23/2018  There was no ST segment deviation noted during stress.  The study is normal. There are no perfusion defects  This is a low risk study.  The left ventricular ejection fraction is hyperdynamic (>65%).    Nm Pulmonary Vent And Perf (v/q Scan)  Result Date: 05/29/2018 CLINICAL DATA:  Shortness of breath for 6 months.  Hemoptysis. EXAM: NUCLEAR MEDICINE VENTILATION - PERFUSION LUNG SCAN TECHNIQUE: Ventilation images were obtained in multiple projections using inhaled aerosol Tc-60m DTPA. Perfusion images were obtained in multiple projections after intravenous injection of Tc-18m MAA. RADIOPHARMACEUTICALS:  29.5 mCi of Tc-43m DTPA aerosol inhalation and 4.0 mCi Tc65m MAA IV COMPARISON:  Chest CT 05/29/2018 FINDINGS: Ventilation: No focal ventilation defect. Patchy retention of the ventilation agent centrally. Perfusion: No wedge shaped peripheral perfusion defects to suggest acute pulmonary embolism. IMPRESSION: Low probability for pulmonary embolus. Patchy retention of the ventilation agent centrally may be seen with air trapping or COPD. Electronically Signed   By: Fidela Salisbury M.D.   On: 05/29/2018 15:24     Assessment & Plan:   No problem-specific Assessment & Plan notes found for this encounter.     Martyn Ehrich, NP 05/31/2018

## 2018-05-31 NOTE — Patient Instructions (Signed)
Resume Coumadin, if you developing hemoptysis please let us know  Keep follow up with Dr. Valeta Harms  Labs today- bmet and BNP   Cancel HRCT

## 2018-05-31 NOTE — Progress Notes (Signed)
@Patient  ID: Miranda Garcia, female    DOB: 1930-04-03, 85 y.o.   MRN: [id number]  Chief Complaint  Patient presents with   Follow-up    hemoptysis x2, last monday, SOB, wheezing    Referring provider: Glenda Chroman, MD  HPI: 85 year old female, never smoked. PMH significant for afib, CHF, CKD, diabetes, pulmonary nodules. Patient of Dr. Valeta Harms, seen for initial consult on 03/31/18.   05/31/2018 Patient presents today for an acute visit with complaints of hemoptysis x 2 days. Associated dyspnea. Accompanied by her daughter. She went to the ED for evaluation on 05/29/18. CTA negative for PE, unchanged large pulmonary nodule in LLL. She was instructed to hold her coumadin for two days. She has not had any further episodes of hemoptysis. Last time she coughed up blood was Monday morning 3/9. States that she experienced gurgling in the morning prior to coughing up blood. Has an apt with cardiology next week. Denies URI symptoms or recently feeling sick. No recent travel or fevers.   Imaging: 05/29/18- CTA showed unchanged large pulmonary nodule in the LLL mesauring 1.5 x 1.2 cm and 37mm LLL nodule. Several other small pulmonary nodules throughout the lungs bilaterally. Areas of ground-glass attenuation. NO honeycombing. ? R base ILD vs atelectasis. Findings consistent with UIP. Recommend repeating in 6-12 months to assess for changes.  PFTs with no obstruction, but mild decreased DLCO   Allergies  Allergen Reactions   Contrast Media [Iodinated Diagnostic Agents]     Pt has 1 full functioning kidney   Penicillins Rash    Immunization History  Administered Date(s) Administered   Influenza, High Dose Seasonal PF 12/29/2017   Influenza,inj,Quad PF,6+ Mos 12/25/2013, 12/30/2015   Influenza,inj,quad, With Preservative 02/07/2017    Past Medical History:  Diagnosis Date   Arthritis    Atrial fibrillation ([place])    Diabetes mellitus type II, controlled ([place])    x 15 yrs    Elevated troponin    Hemorrhoids    Hypertension    Renal insufficiency     Tobacco History: Social History   Tobacco Use  Smoking Status Never Smoker  Smokeless Tobacco Never Used   Counseling given: Not Answered   Outpatient Medications Prior to Visit  Medication Sig Dispense Refill   atorvastatin (LIPITOR) 20 MG tablet TAKE ONE (1) TABLET EACH DAY 6PM 30 tablet 6   calcium citrate-vitamin D (CITRACAL+D) 315-200 MG-UNIT per tablet Take 1 tablet by mouth daily.      diltiazem (CARDIZEM CD) 180 MG 24 hr capsule TAKE ONE (1) CAPSULE EACH DAY 90 capsule 1   furosemide (LASIX) 40 MG tablet TAKE 1 TABLET DAILY AS NEEDED FOR SWELLING 90 tablet 1   Insulin Aspart Prot & Aspart (NOVOLOG MIX 70/30 [place]) Inject into the skin. 25 in morning and 25 at night.     metoprolol tartrate (LOPRESSOR) 50 MG tablet Take 75 mg by mouth 2 (two) times daily.     warfarin (COUMADIN) 5 MG tablet Take 1 tablet (5 mg total) by mouth daily. Or as directed (Patient taking differently: Take 2.5-5 mg by mouth daily. 1 Sunday Wednesday Friday and 1/2 other days) 45 tablet 3   Facility-Administered Medications Prior to Visit  Medication Dose Route Frequency Provider Last Rate Last Dose   neomycin-polymyxin-dexameth (MAXITROL) 0.1 % ophth ointment    PRN Tonny Branch, MD   1 application at 50/93/26 1427    Review of Systems  Review of Systems  Constitutional: Negative.   Respiratory: Positive  for shortness of breath. Negative for cough and wheezing.   Cardiovascular: Negative.     Physical Exam  BP 110/64 (BP Location: Left Arm, Cuff Size: Normal)    Pulse 82    Ht 5\' 3"  (1.6 m)    Wt 207 lb 6.4 oz (94.1 kg)    SpO2 96%    BMI 36.74 kg/m  Physical Exam Constitutional:      Appearance: Normal appearance. She is not ill-appearing.  HENT:     Head: Normocephalic and atraumatic.  Neck:     Musculoskeletal: Normal range of motion and neck supple.  Cardiovascular:     Rate and Rhythm: Normal rate.  Rhythm irregular.  Pulmonary:     Effort: Pulmonary effort is normal. No respiratory distress.     Breath sounds: Rales present. No wheezing or rhonchi.     Comments: Fine crackles to bilateral bases Musculoskeletal: Normal range of motion.  Skin:    General: Skin is warm and dry.  Neurological:     General: No focal deficit present.     Mental Status: She is alert and oriented to person, place, and time. Mental status is at baseline.  Psychiatric:        Mood and Affect: Mood normal.        Behavior: Behavior normal.        Thought Content: Thought content normal.        Judgment: Judgment normal.      Lab Results:  CBC    Component Value Date/Time   WBC 8.5 05/29/2018 1217   RBC 4.01 05/29/2018 1217   HGB 11.9 (L) 05/29/2018 1217   HCT 39.3 05/29/2018 1217   PLT 163 05/29/2018 1217   MCV 98.0 05/29/2018 1217   MCH 29.7 05/29/2018 1217   MCHC 30.3 05/29/2018 1217   RDW 13.9 05/29/2018 1217   LYMPHSABS 1.8 05/29/2018 1217   MONOABS 1.0 05/29/2018 1217   EOSABS 0.1 05/29/2018 1217   BASOSABS 0.0 05/29/2018 1217    BMET    Component Value Date/Time   NA 141 05/31/2018 1621   K 4.6 05/31/2018 1621   CL 106 05/31/2018 1621   CO2 26 05/31/2018 1621   GLUCOSE 134 (H) 05/31/2018 1621   BUN 38 (H) 05/31/2018 1621   CREATININE 2.04 (H) 05/31/2018 1621   CALCIUM 9.0 05/31/2018 1621   GFRNONAA 29 (L) 05/29/2018 1217   GFRAA 33 (L) 05/29/2018 1217    BNP    Component Value Date/Time   BNP 488.0 (H) 05/29/2018 1217    ProBNP    Component Value Date/Time   PROBNP 466.0 (H) 05/31/2018 1621    Imaging: Dg Chest 2 View  Result Date: 05/29/2018 CLINICAL DATA:  Hemoptysis. EXAM: CHEST - 2 VIEW COMPARISON:  Chest CT dated 03/10/2018 and chest x-ray dated 02/22/2018 FINDINGS: Chronic cardiomegaly. Aortic atherosclerosis. Pulmonary vascularity is normal. There is minimal accentuation of the interstitial markings at the lung bases. The multiple small pulmonary nodules  noted on prior CT scans are not appreciable on this chest x-ray. No significant bone abnormality. IMPRESSION: No acute abnormalities.  Chronic cardiomegaly. Aortic Atherosclerosis (ICD10-I70.0). Electronically Signed   By: Lorriane Shire M.D.   On: 05/29/2018 11:52   Ct Chest High Resolution  Result Date: 05/29/2018 CLINICAL DATA:  85 year old female with history of respiratory distress. Hemoptysis. EXAM: CT CHEST WITHOUT CONTRAST TECHNIQUE: Multidetector CT imaging of the chest was performed following the standard protocol without intravenous contrast. High resolution imaging of the lungs, as well as  inspiratory and expiratory imaging, was performed. COMPARISON:  Chest CT 03/10/2018. FINDINGS: Cardiovascular: Heart size is mildly enlarged. There is no significant pericardial fluid, thickening or pericardial calcification. There is aortic atherosclerosis, as well as atherosclerosis of the great vessels of the mediastinum and the coronary arteries, including calcified atherosclerotic plaque in the left anterior descending, left circumflex and right coronary arteries. Calcifications of the aortic valve. Severe calcifications of the mitral annulus. Mediastinum/Nodes: No pathologically enlarged mediastinal or hilar lymph nodes. Please note that accurate exclusion of hilar adenopathy is limited on noncontrast CT scans. Esophagus is unremarkable in appearance. No axillary lymphadenopathy. Lungs/Pleura: Large pulmonary nodule in the left lower lobe (axial image 108 of series 7) measuring 1.5 x 1.2 cm, unchanged. 6 mm left lower lobe pulmonary nodule (axial image 83 of series 7), also unchanged. Several other small pulmonary nodules are also throughout the lungs bilaterally, largest of which is in the apex of the left upper lobe measuring 5 mm (axial image 22 of series 7), unchanged. As noted on the prior examination there are areas of ground-glass attenuation as well as inter- and intralobular septal thickening  (creating a crazy paving appearance) throughout the lung bases (right greater than left). This has increased slightly compared to the prior study, particularly in the left lung base which was relatively uninvolved on the prior. No associated traction bronchiectasis. No honeycombing. Inspiratory and expiratory imaging is unremarkable. No pleural effusions. Upper Abdomen: Aortic atherosclerosis. Mild atrophy of the left kidney. Musculoskeletal: There are no aggressive appearing lytic or blastic lesions noted in the visualized portions of the skeleton. IMPRESSION: 1. There are findings in the lungs which can be seen in the setting of interstitial lung disease, however, given the patient's history of hemoptysis, these bibasilar findings may alternatively simply reflect areas of alveolar hemorrhage. If there is persistent clinical concern for interstitial lung disease, repeat high-resolution chest CT would be recommended in 6-12 months to assess for temporal changes in the appearance of the lung parenchyma. At this time, these findings are considered indeterminate for usual interstitial pneumonia (UIP) per current ATS guidelines. 2. Multiple small pulmonary nodules scattered throughout both lungs, similar in size, number and distribution to the prior study. This is reassuring, but close attention at time of repeat high-resolution chest CT is recommended to ensure continued stability of these findings. 3. Cardiomegaly. 4. Aortic atherosclerosis, in addition to 3 vessel coronary artery disease. 5. There are calcifications of the aortic valve and mitral annulus. Echocardiographic correlation for evaluation of potential valvular dysfunction may be warranted if clinically indicated. Aortic Atherosclerosis (ICD10-I70.0). Electronically Signed   By: Vinnie Langton M.D.   On: 05/29/2018 12:49   Nm Myocar Multi W/spect W/wall Motion / Ef  Result Date: 05/23/2018  There was no ST segment deviation noted during stress.  The  study is normal. There are no perfusion defects  This is a low risk study.  The left ventricular ejection fraction is hyperdynamic (>65%).    Nm Pulmonary Vent And Perf (v/q Scan)  Result Date: 05/29/2018 CLINICAL DATA:  Shortness of breath for 6 months.  Hemoptysis. EXAM: NUCLEAR MEDICINE VENTILATION - PERFUSION LUNG SCAN TECHNIQUE: Ventilation images were obtained in multiple projections using inhaled aerosol Tc-67m DTPA. Perfusion images were obtained in multiple projections after intravenous injection of Tc-14m MAA. RADIOPHARMACEUTICALS:  29.5 mCi of Tc-72m DTPA aerosol inhalation and 4.0 mCi Tc35m MAA IV COMPARISON:  Chest CT 05/29/2018 FINDINGS: Ventilation: No focal ventilation defect. Patchy retention of the ventilation agent centrally. Perfusion: No wedge  shaped peripheral perfusion defects to suggest acute pulmonary embolism. IMPRESSION: Low probability for pulmonary embolus. Patchy retention of the ventilation agent centrally may be seen with air trapping or COPD. Electronically Signed   By: Fidela Salisbury M.D.   On: 05/29/2018 15:24     Assessment & Plan:   Hemoptysis - No further episodes of hemoptysis since 3/9 - Held coumadin for 2 days, ok to resume  - Keep scheduled apt with Dr. Valeta Harms in April 22nd  Dyspnea - 05/29/18 CTA showed areas of ground-glass attenuation. NO honeycombing. ? R base ILD vs atelectasis. Findings consistent with UIP.  - Recommend repeating HRCT in 6-12 months to assess for changes  - Labs today (BNP/BMET)  Pulmonary nodules - Unchanged large pulmonary nodule in the LLL mesauring 1.5 x 1.2 cm and 31mm LLL nodule. Several other small pulmonary nodules throughout the lungs bilaterally. - Patient at this time would not want to purse invasive surgery, may consider bronchoscopy/needle bx for diagnosis if hemoptysis continued    Martyn Ehrich, NP 06/05/2018

## 2018-06-01 ENCOUNTER — Telehealth: Payer: Self-pay | Admitting: Pulmonary Disease

## 2018-06-01 LAB — BASIC METABOLIC PANEL
BUN: 38 mg/dL — ABNORMAL HIGH (ref 6–23)
CO2: 26 mEq/L (ref 19–32)
Calcium: 9 mg/dL (ref 8.4–10.5)
Chloride: 106 mEq/L (ref 96–112)
Creatinine, Ser: 2.04 mg/dL — ABNORMAL HIGH (ref 0.40–1.20)
GFR: 23.13 mL/min — ABNORMAL LOW (ref >60.00)
GLUCOSE: 134 mg/dL — AB (ref 70–99)
Potassium: 4.6 mEq/L (ref 3.5–5.1)
Sodium: 141 mEq/L (ref 135–145)

## 2018-06-01 LAB — BRAIN NATRIURETIC PEPTIDE: Pro B Natriuretic peptide (BNP): 466 pg/mL — ABNORMAL HIGH (ref 0.0–100.0)

## 2018-06-01 NOTE — Telephone Encounter (Signed)
Spoke with pt and advised of lab results/recommendations per Derl Barrow, NP.  Pt aware to take Lasix daily for 3 days.  Pt has appt  With cardiology next week.  Nothing further needed at this time.

## 2018-06-02 DIAGNOSIS — R042 Hemoptysis: Secondary | ICD-10-CM | POA: Insufficient documentation

## 2018-06-02 NOTE — Assessment & Plan Note (Addendum)
-   No further episodes of hemoptysis since 3/9 - Held coumadin for 2 days, ok to resume  - Keep scheduled apt with Dr. Valeta Harms in April 22nd

## 2018-06-05 ENCOUNTER — Encounter: Payer: Self-pay | Admitting: Primary Care

## 2018-06-05 NOTE — Assessment & Plan Note (Addendum)
-   05/29/18 CTA showed areas of ground-glass attenuation. NO honeycombing. ? R base ILD vs atelectasis. Findings consistent with UIP.  - Recommend repeating HRCT in 6-12 months to assess for changes  - Labs today (BNP/BMET)

## 2018-06-05 NOTE — Assessment & Plan Note (Signed)
-   Unchanged large pulmonary nodule in the LLL mesauring 1.5 x 1.2 cm and 43mm LLL nodule. Several other small pulmonary nodules throughout the lungs bilaterally. - Patient at this time would not want to purse invasive surgery, may consider bronchoscopy/needle bx for diagnosis if hemoptysis continued

## 2018-06-06 ENCOUNTER — Telehealth: Payer: Self-pay | Admitting: Cardiology

## 2018-06-06 NOTE — Telephone Encounter (Signed)
Pt daughter Katharine Look says pt has been SOB with weakness in legs - recently seen pulmonary - was concerned that atorvastatin was causing leg weakness also said that coumadin may be changed to something else (had some bleeding in nose and held coumadin for 2 days) - has appt scheduled for Friday - can we just keep this and address issues at that time?

## 2018-06-06 NOTE — Telephone Encounter (Signed)
Has appointment this coming Friday.   Wanting to know if she needs to come in for appoitnment.  Stated that she is really needing to see him or talk with about Lipitor/coumadin   [phone] Katharine Look ( daughter)

## 2018-06-06 NOTE — Progress Notes (Signed)
PCCM: Agreed. Follow up scheduled with me  Thanks for seeing  [name], DO [facility] 06/06/2018 3:26 PM

## 2018-06-07 NOTE — Telephone Encounter (Signed)
Can see Friday, or if they prefer we should have plenty of slots opening up for Thursday as well    J Maitlyn Penza MD

## 2018-06-07 NOTE — Telephone Encounter (Signed)
LM on sandras VM to keep appt as scheduled on Friday

## 2018-06-09 ENCOUNTER — Encounter: Payer: Self-pay | Admitting: Cardiology

## 2018-06-09 ENCOUNTER — Ambulatory Visit: Payer: Medicare HMO | Admitting: Cardiology

## 2018-06-09 ENCOUNTER — Other Ambulatory Visit: Payer: Self-pay

## 2018-06-09 VITALS — BP 150/82 | HR 80 | Ht 63.0 in | Wt 205.8 lb

## 2018-06-09 DIAGNOSIS — N183 Chronic kidney disease, stage 3 unspecified: Secondary | ICD-10-CM

## 2018-06-09 DIAGNOSIS — I5032 Chronic diastolic (congestive) heart failure: Secondary | ICD-10-CM

## 2018-06-09 DIAGNOSIS — I4891 Unspecified atrial fibrillation: Secondary | ICD-10-CM | POA: Diagnosis not present

## 2018-06-09 MED ORDER — APIXABAN 2.5 MG PO TABS: 2.5000 mg | ORAL_TABLET | Freq: Two times a day (BID) | ORAL | 3 refills | Status: DC

## 2018-06-09 NOTE — Patient Instructions (Signed)
Your physician recommends that you schedule a follow-up appointment in: [place] has recommended you make the following change in your medication:   PLEASE LET us KNOW ABOUT STARTING ELIQUIS AFTER FINDING OUT COST FROM INSURANCE  STOP COUMADIN FOR 3 DAYS BEFORE STARTING ELIQUIS  HOLD LIPITOR FOR 3 WEEKS AND UPDATE [place] ON YOUR LEG PAIN   Your physician recommends that you return for lab work in: 1 WEEK BMP  Thank you for choosing [facility]!!

## 2018-06-09 NOTE — Progress Notes (Signed)
Clinical Summary Miranda Garcia is a 85 y.o.female seen today for follow up of the following medical problems.     1. Chronic diastolic HF - working to limit diuretic due to her renal dysfunction  - worsening DOE with activities - DOE walking from parking lot today. DOE at [employer]. DOE at 1/2 to 1 block - no recent edema - no orthopnea but can have wheezing.  - no chest pain. Rare palpitations.   - recent elevated BNP - Cr 1.6-1.7 range, up to 2.04 by 05/31/18 labs - took her lasix x 3 days in a row 40mg , now doing 20mg  every other day. Has had some weight loss by a few lbs, breathing mildly improved.     2. Dyspnea - extensive pulmonmary and cardiac workup over the last few months.  Jan 2020 PFTs suggestive of restriction, mildly redcued DLCO but corrects for alveolar ventilation  04/2018 echo: LVEF 60-65%, indeterminate diastolic function, reported moderate aortic stenosis - 05/2018 nuclear stress: no ischemia 05/2018 CT chest high resolution: findings suggesting of interstitial lung disease vs alveolar hemorrage.  05/2018 VQ scan: low probability PE, Patchy retention of the ventilation agent centrally may be seen with air trapping or COPD.   ER visit 05/2018 with hemoptysis and dyspnea - imaging as reported above. INR 1.9. BNP 488 Hgb 11.9 K 4 Cr 1.62    3. Aortic stenosis - 04/2018 moderate AS per report. AVA VTI 1.46, mean gradient 10. Would consider as mild AS.     4 Lung nodule - followed by pulmonary, enlarged by CT 02/2018 PFT's with no obstruction, but mild decrease in DLCO ( 14.27or 62% predicted)   5. Leg pains/weakness - bilateral leg weakness and pain. She questions if this is statin related.    6. 2. Afib - she nows has some intersted in NOACs, previously preferred coumadin.     7.Hyperklaemia - recent elevated K, pcp had her stop her valsartan Past Medical History:  Diagnosis Date  . Arthritis   . Atrial fibrillation  (Miranda Garcia)   . Diabetes mellitus type II, controlled (Miranda Garcia)    x 15 yrs  . Elevated troponin   . Hemorrhoids   . Hypertension   . Renal insufficiency      Allergies  Allergen Reactions  . Contrast Media [Iodinated Diagnostic Agents]     Pt has 1 full functioning kidney  . Penicillins Rash     Current Outpatient Medications  Medication Sig Dispense Refill  . atorvastatin (LIPITOR) 20 MG tablet TAKE ONE (1) TABLET EACH DAY 6PM 30 tablet 6  . calcium citrate-vitamin D (CITRACAL+D) 315-200 MG-UNIT per tablet Take 1 tablet by mouth daily.     Marland Kitchen diltiazem (CARDIZEM CD) 180 MG 24 hr capsule TAKE ONE (1) CAPSULE EACH DAY 90 capsule 1  . furosemide (LASIX) 40 MG tablet TAKE 1 TABLET DAILY AS NEEDED FOR SWELLING 90 tablet 1  . Insulin Aspart Prot & Aspart (NOVOLOG MIX 70/30 [place]) Inject into the skin. 25 in morning and 25 at night.    . metoprolol tartrate (LOPRESSOR) 50 MG tablet Take 75 mg by mouth 2 (two) times daily.    Marland Kitchen warfarin (COUMADIN) 5 MG tablet Take 1 tablet (5 mg total) by mouth daily. Or as directed (Patient taking differently: Take 2.5-5 mg by mouth daily. 1 Sunday Wednesday Friday and 1/2 other days) 45 tablet 3   No current facility-administered medications for this visit.    Facility-Administered Medications Ordered in Other Visits  Medication Dose Route Frequency Provider Last Rate Last Dose  . neomycin-polymyxin-dexameth (MAXITROL) 0.1 % ophth ointment    PRN Tonny Akelia Husted, MD   1 application at 22/97/98 1427     Past Surgical History:  Procedure Laterality Date  . [place]   [facility]  . CATARACT EXTRACTION W/PHACO  01/11/2011   Procedure: CATARACT EXTRACTION PHACO AND INTRAOCULAR LENS PLACEMENT (IOC);  Surgeon: Tonny Reino Lybbert;  Location: [facility] ORS;  Service: Ophthalmology;  Laterality: Right;  CDE 19.32  . CATARACT EXTRACTION W/PHACO  01/21/2011   Procedure: CATARACT EXTRACTION PHACO AND INTRAOCULAR LENS PLACEMENT (IOC);  Surgeon: Tonny Cowen Pesqueira;  Location: [facility] ORS;  Service:  Ophthalmology;  Laterality: Left;  CDE: 21.19  . COLONOSCOPY N/A 09/12/2013   Procedure: COLONOSCOPY;  Surgeon: Rogene Houston, MD;  Location: [facility] END[facility] SUITE;  Service: Endoscopy;  Laterality: N/A;  125  . CYSTOSCOPY     [name]  . LITHOTRIPSY     [facility]  . PARTIAL HYSTERECTOMY       Allergies  Allergen Reactions  . Contrast Media [Iodinated Diagnostic Agents]     Pt has 1 full functioning kidney  . Penicillins Rash      Family History  Problem Relation Age of Onset  . Colon cancer Brother      Social History Ms. Miranda Garcia reports that she has never smoked. She has never used smokeless tobacco. Ms. Miranda Garcia reports no history of alcohol use.   Review of Systems CONSTITUTIONAL: No weight loss, fever, chills, weakness or fatigue.  HEENT: Eyes: No visual loss, blurred vision, double vision or yellow sclerae.No hearing loss, sneezing, congestion, runny nose or sore throat.  SKIN: No rash or itching.  CARDIOVASCULAR: per hpi RESPIRATORY:per hpi GASTROINTESTINAL: No anorexia, nausea, vomiting or diarrhea. No abdominal pain or blood.  GENITOURINARY: No burning on urination, no polyuria NEUROLOGICAL: No headache, dizziness, syncope, paralysis, ataxia, numbness or tingling in the extremities. No change in bowel or bladder control.  MUSCULOSKELETAL: No muscle, back pain, joint pain or stiffness.  LYMPHATICS: No enlarged nodes. No history of splenectomy.  PSYCHIATRIC: No history of depression or anxiety.  ENDOCRINOLOGIC: No reports of sweating, cold or heat intolerance. No polyuria or polydipsia.  Marland Kitchen   Physical Examination Today's Vitals   06/09/18 0949  BP: (!) 150/82  Pulse: 80  SpO2: 97%  Weight: 205 lb 12.8 oz (93.4 kg)  Height: 5\' 3"  (1.6 m)   Body mass index is 36.46 kg/m.  Gen: resting comfortably, no acute distress HEENT: no scleral icterus, pupils equal round and reactive, no palptable cervical adenopathy,  CV: RRR, no m/r/g, no jvd Resp: Clear to  auscultation bilaterally GI: abdomen is soft, non-tender, non-distended, normal bowel sounds, no hepatosplenomegaly MSK: extremities are warm, no edema.  Skin: warm, no rash Neuro:  no focal deficits Psych: appropriate affect   Diagnostic Studies 02/2012 Echo: LVEF 60-65%, mild LVH, no WMAs, grade II diastolic dysfunction, mode LAE.  02/2012 MPI:  Abnormal Lexiscan Myoview with no diagnostic electrocardiographic changes. The scintigraphic results show breast attenuation. There is also mild ischemia in the mid and basal inferolateral wall. The gated ejection fraction was 80% and the wall motion was normal.  10/2016 carotid US 1-39% bilateral disease  10/2016 echo ------------------------------------------------------------------- Study Conclusions  - Left ventricle: The cavity size was normal. Wall thickness was increased in a pattern of mild LVH. Systolic function was vigorous. The estimated ejection fraction was in the range of 65% to 70%. Wall motion was normal; there were no  regional wall motion abnormalities. Doppler parameters are consistent with abnormal left ventricular relaxation (grade 1 diastolic dysfunction). Doppler parameters are consistent with high ventricular filling pressure. - Aortic valve: Mildly calcified annulus. Trileaflet; mildly thickened leaflets. Valve area (VTI): 1.56 cm^2. Valve area (Vmax): 1.7 cm^2. - Mitral valve: Mildly calcified annulus. Mildly thickened leaflets . - Left atrium: The atrium was moderately dilated. - Technically adequate study.  03/10/2018: CT chest Review of the imaging reveals a small basilar peripheral groundglass changes to me likely represents atelectasis. To see if this was to disappear patient would need prone imaging.   04/2018 echo 1. The left ventricle has normal systolic function with an ejection fraction of 60-65%. The cavity size was normal. Left ventricular diastolic Doppler parameters  are indeterminate. No evidence of left ventricular regional wall motion abnormalities.  2. The right ventricle has normal systolic function. The cavity was normal. There is mildly increased right ventricular wall thickness. Right ventricular systolic pressure is mildly elevated with an estimated pressure of 36.6 mmHg.  3. The mitral valve is degenerative. There is moderate mitral annular calcification present.  4. The aortic valve is functionally bicuspid. Aortic valve regurgitation is trivial by color flow Doppler. There is moderate aortic stenosis with valve area 1.2-1.4 cm2, although gradients are relatively low.  5. The aortic root is normal in size and structure.  6. The tricuspid valve is normal in structure.  7. No evidence of left ventricular regional wall motion abnormalities.  8. Left atrial size was severely dilated.  9. Possible small to moderate, posterior pericardial effusion. Fat pad is also present.   Assessment and Plan  1.SOB/Dyspnea/Chronic diastolic HF - recent SOB/DOE of unclear etiology. Extensive cardiac workup fairly benign other than diastolic dysfunction.  -with recent increased lasix use Cr is trending up. Currently taking lasix 20mg  every other day. Recheck BMET, if Cr trending up further change lasix to twice weekly with prn as needed - continued lung workup by pulmonary, CT is abnormal       2. Afib - CHADS2VASC score is 4, she is interested in changing to eliquis pending cost. We will write new Rx for eliquis 2.5mg  bid after stopping coumadin for 3 days.    3. 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 May need diuretic adjustment   Arnoldo Lenis, M.D.

## 2018-06-19 DIAGNOSIS — I5032 Chronic diastolic (congestive) heart failure: Secondary | ICD-10-CM | POA: Diagnosis not present

## 2018-06-21 ENCOUNTER — Telehealth: Payer: Self-pay | Admitting: *Deleted

## 2018-06-21 NOTE — Telephone Encounter (Signed)
LM to return call.

## 2018-06-21 NOTE — Telephone Encounter (Signed)
-----   Message from Arnoldo Lenis, MD sent at 06/21/2018  3:43 PM EDT ----- Labs show some ongoing stress on the kidneys. Verify she is taking lasix 20mg  every other day, if so change to 20mg  on Mondays and Friday, may take on addiontla days prn leg swelling of weight gain of 4 lbs   Zandra Abts MD

## 2018-06-26 ENCOUNTER — Telehealth: Payer: Self-pay | Admitting: Cardiology

## 2018-06-26 NOTE — Telephone Encounter (Signed)
Pt confirmed she is taking lasix 20 mg every other day - will change to Mondays and Fridays and as needed for swelling or weight gain - routed to pcp - updated medication list

## 2018-06-26 NOTE — Telephone Encounter (Signed)
Patient calling for lab results / tg

## 2018-06-26 NOTE — Telephone Encounter (Signed)
See lab result phone note °

## 2018-07-03 ENCOUNTER — Telehealth: Payer: Self-pay | Admitting: Cardiology

## 2018-07-03 MED ORDER — PRAVASTATIN SODIUM 20 MG PO TABS: 20.0000 mg | ORAL_TABLET | Freq: Every evening | ORAL | 3 refills | Status: DC

## 2018-07-03 NOTE — Telephone Encounter (Signed)
Patient called in regards to coming off the Lipitor. States that her legs are better. Dr. Harl Bowie had requested that she call the office.

## 2018-07-03 NOTE — Telephone Encounter (Signed)
Can we try pravastatin 20mg  daily, d/c atorvastatinc. This is a more mild statin and better tolerated. Can we clarify what happened with her blood thinners, she was to try to get eliquis but if too expensive was to stay on coumadin, we have both meds still on her med list. Can we list lipitor as allergy with leg pains   Zandra Abts MD

## 2018-07-03 NOTE — Telephone Encounter (Signed)
Sorry sent to [facility] by mistake, forwarding to Charolotte Eke MD

## 2018-07-03 NOTE — Telephone Encounter (Signed)
I will defer to Dr.Branch as patient has been office statins for 3 weeks and her leg pain is better

## 2018-07-03 NOTE — Telephone Encounter (Signed)
Pt was able to afford Eliquis, no longer takes coumadin- will remove from med list

## 2018-07-07 ENCOUNTER — Ambulatory Visit (HOSPITAL_COMMUNITY): Payer: Medicare HMO

## 2018-07-12 ENCOUNTER — Ambulatory Visit: Payer: Medicare HMO | Admitting: Pulmonary Disease

## 2018-07-18 ENCOUNTER — Telehealth: Payer: Self-pay | Admitting: Cardiology

## 2018-07-18 NOTE — Telephone Encounter (Signed)
I spoke with daughter, Katharine Look, she will have her Mom take lasix daily for 4 days, and then go back to twice a week dosing.She will call us back with update

## 2018-07-18 NOTE — Telephone Encounter (Signed)
Daughter called patient has not been feeling well past couple days.  This morning around 3;30 she felt heaviness on her chest and she was wheezing.  Short of Breath, but no chest pain.  209/176 HR  89 5:30am 207/95  HR 85   5:43 am After blood pressure medicine it came down to 159/85  HR 79 6:46 am

## 2018-07-18 NOTE — Telephone Encounter (Signed)
Per daughter, her mother has had no appetite for past two weeks, not eating,feels full quickly, no weight change.This week has diarrhea. Awakened at night with chest pressure,(no radiation, diaphoresis) had to sit up,felt like she couldn't breath. Daughter states she only takes lasix 2 x/week    Her mother feels ok this morning   Please advise

## 2018-07-18 NOTE — Telephone Encounter (Signed)
Take lasix x 4 days, then resume her twice a week schedule. I would touch base with her pcp regarding the diarrhea. Let us know if bp's remain elevated, perhaps getting some fluid off will help bring down her bp's  Zandra Abts MD

## 2018-07-21 DIAGNOSIS — Z299 Encounter for prophylactic measures, unspecified: Secondary | ICD-10-CM | POA: Diagnosis not present

## 2018-07-21 DIAGNOSIS — Z6837 Body mass index (BMI) 37.0-37.9, adult: Secondary | ICD-10-CM | POA: Diagnosis not present

## 2018-07-21 DIAGNOSIS — I4891 Unspecified atrial fibrillation: Secondary | ICD-10-CM | POA: Diagnosis not present

## 2018-07-21 DIAGNOSIS — E1122 Type 2 diabetes mellitus with diabetic chronic kidney disease: Secondary | ICD-10-CM | POA: Diagnosis not present

## 2018-07-21 DIAGNOSIS — I10 Essential (primary) hypertension: Secondary | ICD-10-CM | POA: Diagnosis not present

## 2018-07-24 DIAGNOSIS — R5383 Other fatigue: Secondary | ICD-10-CM | POA: Diagnosis not present

## 2018-07-25 ENCOUNTER — Telehealth: Payer: Self-pay | Admitting: Cardiology

## 2018-07-25 DIAGNOSIS — N183 Chronic kidney disease, stage 3 (moderate): Secondary | ICD-10-CM | POA: Diagnosis not present

## 2018-07-25 DIAGNOSIS — Z6837 Body mass index (BMI) 37.0-37.9, adult: Secondary | ICD-10-CM | POA: Diagnosis not present

## 2018-07-25 DIAGNOSIS — I4891 Unspecified atrial fibrillation: Secondary | ICD-10-CM | POA: Diagnosis not present

## 2018-07-25 DIAGNOSIS — E1165 Type 2 diabetes mellitus with hyperglycemia: Secondary | ICD-10-CM | POA: Diagnosis not present

## 2018-07-25 DIAGNOSIS — Z299 Encounter for prophylactic measures, unspecified: Secondary | ICD-10-CM | POA: Diagnosis not present

## 2018-07-25 DIAGNOSIS — E1122 Type 2 diabetes mellitus with diabetic chronic kidney disease: Secondary | ICD-10-CM | POA: Diagnosis not present

## 2018-07-25 DIAGNOSIS — I10 Essential (primary) hypertension: Secondary | ICD-10-CM | POA: Diagnosis not present

## 2018-07-25 NOTE — Telephone Encounter (Signed)
Patient's daughter would like to discuss patient's continued symptoms of swelling, pressure in chest and SOB/tg

## 2018-07-26 ENCOUNTER — Encounter: Payer: Self-pay | Admitting: *Deleted

## 2018-07-26 NOTE — Telephone Encounter (Signed)
Pt daughter says for the last few nights having a hard time breathing when laying down at night with chest pressure/heaviness in chest - didn't feel well all weekend c/o weakness and fullness in stomach that's making her eat less. Pt called Dr Woody Seller on Friday and was sent a home BP monitor which showed elevated BP. Pt went to see Dr Woody Seller yesterday and had labs done - stopped Lopressor and gave her valsartan 160 mg bid and another medication she did not know name of (will request records and labs from Dr Woody Seller) and was tol to f/u with Dr Harl Bowie - will forward to covering provider and request notes/labs

## 2018-07-26 NOTE — Telephone Encounter (Signed)
Spoke with daughter and will schedule telehealth appt for 5/13 @ 10am [employer] video. Records received per admin staff in office and will ask them to email to Dr Bronson Ing and Dr Harl Bowie.

## 2018-07-26 NOTE — Telephone Encounter (Signed)
I reviewed prior phone notes and Dr. Nelly Laurence most recent office note. Will await records and labs from PCP. I would schedule her for a telehealth visit with Dr. Harl Bowie next week to go over everything.

## 2018-07-27 DIAGNOSIS — Z79899 Other long term (current) drug therapy: Secondary | ICD-10-CM | POA: Diagnosis not present

## 2018-07-27 DIAGNOSIS — I509 Heart failure, unspecified: Secondary | ICD-10-CM | POA: Diagnosis not present

## 2018-07-28 ENCOUNTER — Other Ambulatory Visit: Payer: Self-pay

## 2018-07-28 ENCOUNTER — Encounter (HOSPITAL_COMMUNITY): Payer: Self-pay | Admitting: Emergency Medicine

## 2018-07-28 ENCOUNTER — Emergency Department (HOSPITAL_COMMUNITY): Payer: Medicare HMO

## 2018-07-28 ENCOUNTER — Emergency Department (HOSPITAL_COMMUNITY)
Admission: EM | Admit: 2018-07-28 | Discharge: 2018-07-28 | Disposition: A | Discharge status: Home / Self Care | Payer: Medicare HMO | Attending: Emergency Medicine | Admitting: Emergency Medicine

## 2018-07-28 DIAGNOSIS — R0600 Dyspnea, unspecified: Secondary | ICD-10-CM | POA: Diagnosis not present

## 2018-07-28 DIAGNOSIS — N183 Chronic kidney disease, stage 3 (moderate): Secondary | ICD-10-CM | POA: Insufficient documentation

## 2018-07-28 DIAGNOSIS — I5032 Chronic diastolic (congestive) heart failure: Secondary | ICD-10-CM | POA: Insufficient documentation

## 2018-07-28 DIAGNOSIS — Z79899 Other long term (current) drug therapy: Secondary | ICD-10-CM | POA: Diagnosis not present

## 2018-07-28 DIAGNOSIS — E119 Type 2 diabetes mellitus without complications: Secondary | ICD-10-CM | POA: Diagnosis not present

## 2018-07-28 DIAGNOSIS — Z794 Long term (current) use of insulin: Secondary | ICD-10-CM | POA: Diagnosis not present

## 2018-07-28 DIAGNOSIS — R0602 Shortness of breath: Secondary | ICD-10-CM | POA: Diagnosis not present

## 2018-07-28 DIAGNOSIS — Z7901 Long term (current) use of anticoagulants: Secondary | ICD-10-CM | POA: Insufficient documentation

## 2018-07-28 DIAGNOSIS — E1122 Type 2 diabetes mellitus with diabetic chronic kidney disease: Secondary | ICD-10-CM | POA: Insufficient documentation

## 2018-07-28 DIAGNOSIS — I130 Hypertensive heart and chronic kidney disease with heart failure and stage 1 through stage 4 chronic kidney disease, or unspecified chronic kidney disease: Secondary | ICD-10-CM | POA: Diagnosis not present

## 2018-07-28 DIAGNOSIS — R69 Illness, unspecified: Secondary | ICD-10-CM | POA: Diagnosis not present

## 2018-07-28 LAB — TROPONIN I: Troponin I: <0.03 ng/mL (ref <0.03)

## 2018-07-28 LAB — CBC WITH DIFFERENTIAL/PLATELET
Basophils Absolute: 0.1 10*3/uL (ref 0.0–0.1)
Basophils Relative: 1 %
Eosinophils Absolute: 0.2 10*3/uL (ref 0.0–0.5)
Eosinophils Relative: 3 %
HCT: 38.4 % (ref 36.0–46.0)
Hemoglobin: 12.1 g/dL (ref 12.0–15.0)
Lymphocytes Relative: 26 %
Lymphs Abs: 1.6 10*3/uL (ref 0.7–4.0)
MCHC: 31.5 g/dL (ref 30.0–36.0)
MCV: 97 fL (ref 80.0–100.0)
Monocytes Absolute: 1 10*3/uL (ref 0.1–1.0)
Monocytes Relative: 11 %
Neutro Abs: 3.7 10*3/uL (ref 1.7–7.7)
Neutrophils Relative %: 60 %
Platelets: 163 10*3/uL (ref 150–400)
RBC: 3.96 MIL/uL (ref 3.87–5.11)
RDW: 13.3 % (ref 11.5–15.5)
WBC: 6.1 10*3/uL (ref 4.0–10.5)

## 2018-07-28 LAB — BASIC METABOLIC PANEL
Anion gap: 9 (ref 5–15)
BUN: 51 mg/dL — ABNORMAL HIGH (ref 8–23)
CO2: 25 mmol/L (ref 22–32)
Calcium: 8.9 mg/dL (ref 8.9–10.3)
Chloride: 107 mmol/L (ref 98–111)
Creatinine, Ser: 2.64 mg/dL — ABNORMAL HIGH (ref 0.44–1.00)
GFR calc Af Amer: 18 mL/min — ABNORMAL LOW (ref >60)
GFR calc non Af Amer: 16 mL/min — ABNORMAL LOW (ref >60)
Glucose, Bld: 210 mg/dL — ABNORMAL HIGH (ref 70–99)
Potassium: 4.8 mmol/L (ref 3.5–5.1)
Sodium: 141 mmol/L (ref 135–145)

## 2018-07-28 LAB — BRAIN NATRIURETIC PEPTIDE: B Natriuretic Peptide: 241 pg/mL — ABNORMAL HIGH (ref 0.0–100.0)

## 2018-07-28 MED ORDER — SODIUM CHLORIDE 0.9 % IV BOLUS: 1000.0000 mL | Freq: Once | INTRAVENOUS | Status: DC

## 2018-07-28 NOTE — ED Notes (Signed)
Call from [place] Gear, [place] updated   Daughter would appreciate call from ER doctor

## 2018-07-28 NOTE — ED Triage Notes (Signed)
Pt had lab work done and her bnp was elevated and her doctor wanted her to come get checked out

## 2018-07-28 NOTE — Telephone Encounter (Signed)
Daughter concerned that BNP is 2994 (drawn 07/27/18 in [facility]) , Dr Woody Seller nurse told her it was a liitle elevated and to take half a lasix tab for 2 days.Daughter says he BP in the mornings is 197/90's, the last 3 weeks she has gone "down hill", sleeps with 3 pillows at night, is SOB and has leg swelling. Daughter wonders if she should just take her top the ED for evaluation.

## 2018-07-28 NOTE — Discharge Instructions (Addendum)
Increase your Lasix to 20 mg daily for the next 4 days.  Follow-up next week with your cardiologist and/or primary doctor for recheck.

## 2018-07-28 NOTE — ED Triage Notes (Addendum)
Dr in [place], [place], suggested that pt take extra lasix  BNP elevated  Dr Harl Bowie surggested pt come here for eval

## 2018-07-28 NOTE — ED Notes (Addendum)
Pt resting awaiting lab results

## 2018-07-28 NOTE — ED Provider Notes (Signed)
[facility] EMERGENCY DEPARTMENT Provider Note   CSN: [id number] Arrival date & time: 07/28/18  1810    History   Chief Complaint Chief Complaint  Patient presents with  . Abnormal Lab    HPI Miranda Garcia is a 85 y.o. female.     Patient is an 85 year old female with history of diabetes, atrial fibrillation, hypertension, congestive heart failure.  She presents today for evaluation of shortness of breath.  According to the patient this is been ongoing for quite some time, however has become worse over the past few weeks.  She describes dyspnea on exertion and orthopnea.  She denies any fevers, chills, or cough.  Patient has had work-up recently into this including CT scan, pulmonology consult, echocardiogram, however no definite cause has been found.  She had blood work done earlier this week by her primary doctor.  She was told she had an elevated BNP and that she should come to the ER to be evaluated.  The history is provided by the patient.  Abnormal Lab    Past Medical History:  Diagnosis Date  . Arthritis   . Atrial fibrillation ([place])   . Diabetes mellitus type II, controlled ([place])    x 15 yrs  . Elevated troponin   . Hemorrhoids   . Hypertension   . Renal insufficiency     Patient Active Problem List   Diagnosis Date Noted  . Hemoptysis 06/02/2018  . Dyspnea 04/17/2018  . Pulmonary nodules 04/17/2018  . Heme positive stool 08/23/2013  . Long term (current) use of anticoagulants 03/03/2012  . Chronic diastolic CHF (congestive heart failure) ([place]) 02/28/2012  . Hypomagnesemia 02/26/2012  . Elevated troponin 02/26/2012  . Congestive heart failure ([place]) 02/26/2012  . Atrial fibrillation ([place]) 02/25/2012  . Chest pain 02/25/2012  . CKD (chronic kidney disease) stage 3, GFR 30-59 ml/min (HCC) 02/25/2012  . Leukocytosis 02/25/2012  . Anemia due to chronic illness 02/25/2012  . Diabetes ([place]) 02/25/2012    Past Surgical History:  Procedure Laterality Date   . [place]   [facility]  . CATARACT EXTRACTION W/PHACO  01/11/2011   Procedure: CATARACT EXTRACTION PHACO AND INTRAOCULAR LENS PLACEMENT (IOC);  Surgeon: Tonny Branch;  Location: [facility] ORS;  Service: Ophthalmology;  Laterality: Right;  CDE 19.32  . CATARACT EXTRACTION W/PHACO  01/21/2011   Procedure: CATARACT EXTRACTION PHACO AND INTRAOCULAR LENS PLACEMENT (IOC);  Surgeon: Tonny Branch;  Location: [facility] ORS;  Service: Ophthalmology;  Laterality: Left;  CDE: 21.19  . COLONOSCOPY N/A 09/12/2013   Procedure: COLONOSCOPY;  Surgeon: Rogene Houston, MD;  Location: [facility] ENDO SUITE;  Service: Endoscopy;  Laterality: N/A;  125  . CYSTOSCOPY     [name]  . LITHOTRIPSY     [facility]  . PARTIAL HYSTERECTOMY       OB History   No obstetric history on file.      Home Medications    Prior to Admission medications   Medication Sig Start Date End Date Taking? Authorizing Provider  apixaban (ELIQUIS) 2.5 MG TABS tablet Take 1 tablet (2.5 mg total) by mouth 2 (two) times daily. 06/09/18   Arnoldo Lenis, MD  atorvastatin (LIPITOR) 20 MG tablet TAKE ONE (1) TABLET EACH DAY 6PM 03/14/15   Arnoldo Lenis, MD  calcium citrate-vitamin D (CITRACAL+D) 315-200 MG-UNIT per tablet Take 1 tablet by mouth daily.     [provider]  diltiazem (CARDIZEM CD) 180 MG 24 hr capsule TAKE ONE (1) CAPSULE EACH DAY 05/31/18  Arnoldo Lenis, MD  furosemide (LASIX) 20 MG tablet Take 20 mg by mouth. Take 1 tablet every other day - may take additional 20 mg as needed for swelling or weight gain over 4lbs    [provider]  Insulin Aspart Prot & Aspart (NOVOLOG MIX 70/30 [place]) Inject into the skin. 25 in morning and 25 at night.    [provider]  metoprolol tartrate (LOPRESSOR) 50 MG tablet Take 75 mg by mouth 2 (two) times daily.    [provider]  pravastatin (PRAVACHOL) 20 MG tablet Take 1 tablet (20 mg total) by mouth every evening. 07/03/18 10/01/18  Arnoldo Lenis, MD    Family  History Family History  Problem Relation Age of Onset  . Colon cancer Brother     Social History Social History   Tobacco Use  . Smoking status: Never Smoker  . Smokeless tobacco: Never Used  Substance Use Topics  . Alcohol use: No    Alcohol/week: 0.0 standard drinks  . Drug use: No     Allergies   Atorvastatin; Contrast media [iodinated diagnostic agents]; and Penicillins   Review of Systems Review of Systems  All other systems reviewed and are negative.    Physical Exam Updated Vital Signs BP (!) 151/71 (BP Location: Right Arm)   Pulse 82   Temp 98.2 F (36.8 C) (Oral)   Resp 14   Ht 5\' 3"  (1.6 m)   Wt 93 kg   SpO2 100%   BMI 36.31 kg/m   Physical Exam Vitals signs and nursing note reviewed.  Constitutional:      General: She is not in acute distress.    Appearance: She is well-developed. She is not diaphoretic.  HENT:     Head: Normocephalic and atraumatic.  Neck:     Musculoskeletal: Normal range of motion and neck supple.  Cardiovascular:     Rate and Rhythm: Normal rate and regular rhythm.     Heart sounds: No murmur. No friction rub. No gallop.   Pulmonary:     Effort: Pulmonary effort is normal. No respiratory distress.     Breath sounds: Normal breath sounds. No wheezing.  Abdominal:     General: Bowel sounds are normal. There is no distension.     Palpations: Abdomen is soft.     Tenderness: There is no abdominal tenderness.  Musculoskeletal: Normal range of motion.        General: No swelling or tenderness.     Right lower leg: No edema.     Left lower leg: No edema.  Skin:    General: Skin is warm and dry.  Neurological:     Mental Status: She is alert and oriented to person, place, and time.      ED Treatments / Results  Labs (all labs ordered are listed, but only abnormal results are displayed) Labs Reviewed  BRAIN NATRIURETIC PEPTIDE  BASIC METABOLIC PANEL  CBC WITH DIFFERENTIAL/PLATELET  TROPONIN I    EKG EKG  Interpretation  Date/Time:  Friday Jul 28 2018 19:20:08 EDT Ventricular Rate:  97 PR Interval:    QRS Duration: 103 QT Interval:  375 QTC Calculation: 477 R Axis:   -43 Text Interpretation:  Atrial fibrillation Left axis deviation Low voltage, precordial leads Consider anterior infarct Confirmed by Veryl Speak [id number]) on 07/28/2018 9:19:35 PM   Radiology No results found.  Procedures Procedures (including critical care time)  Medications Ordered in ED Medications - No data to display  Initial Impression / Assessment and Plan / ED Course  I have reviewed the triage vital signs and the nursing notes.  Pertinent labs & imaging results that were available during my care of the patient were reviewed by me and considered in my medical decision making (see chart for details).  Patient presents here with complaints of shortness of breath.  This is apparently been progressing for quite some time.  Her presentation and symptoms sound like CHF.  She had an elevated BNP today in the clinic and was referred here to be evaluated.  The patient's oxygen saturations are adequate on room air.  She is in atrial fibrillation, but the remainder of her work-up is essentially unremarkable.  Her BNP is in the 200s, chest x-ray suggestive of failure.  At this point, I do no indication for admission.  I had a long conversation with the patient and the patient's daughter it is my advice that the patient increase her Lasix for the next few days, then follow-up with her cardiologist and/or primary doctor this upcoming week.  Final Clinical Impressions(s) / ED Diagnoses   Final diagnoses:  None    ED Discharge Orders    None       Veryl Speak, MD 07/28/18 2121

## 2018-07-28 NOTE — Telephone Encounter (Signed)
I have reviewed labs and phone note and agree that patient should be taken to ED for further evaluation as she may be in decompensated heart failure and require IV diuretics with close monitoring of renal function and needs BP med titration.

## 2018-07-28 NOTE — Telephone Encounter (Signed)
Please give pt's daughter Tyla Burgner a call @ [phone] concerning pt's lab results - Dr. Woody Seller was supposed to send over the results from labs, daughter is going to call and have them send them over to [place]

## 2018-07-28 NOTE — Telephone Encounter (Signed)
Daughter will take mother to [facility] ED

## 2018-07-31 ENCOUNTER — Telehealth: Payer: Self-pay | Admitting: Cardiology

## 2018-07-31 NOTE — Telephone Encounter (Signed)
Spoke with pt's daughter who states that pt has DOE and was taken to [facility] ED on this past weekend. Pt was instructed to take lasix daily for 4 days. Daughter states that when pt is not taking lasix she does not feel well but they understand that she can't take daily d/t having one kidney. Daughter would like to know if pt can be seen in office rather than virtual visit. Daughter would also like to know why BNP levels taken only a day apart are so different. Please advise

## 2018-07-31 NOTE — Telephone Encounter (Signed)
Please give pt's daughter Katharine Look a call @ [phone]

## 2018-07-31 NOTE — Telephone Encounter (Signed)
Called back checking on status of call back

## 2018-08-01 NOTE — Telephone Encounter (Signed)
Called to notify daughter. No answer. LMTCB.

## 2018-08-01 NOTE — Telephone Encounter (Signed)
Daughter notified and voiced understanding

## 2018-08-01 NOTE — Telephone Encounter (Signed)
Can we change her appt with me Thurs in [place] to an in office visit. We can look over and discuss everything at that time    Zandra Abts MD

## 2018-08-03 ENCOUNTER — Encounter: Payer: Self-pay | Admitting: Cardiology

## 2018-08-03 ENCOUNTER — Ambulatory Visit (INDEPENDENT_AMBULATORY_CARE_PROVIDER_SITE_OTHER): Payer: Medicare HMO | Admitting: Cardiology

## 2018-08-03 VITALS — BP 141/75 | HR 87 | Temp 98.3°F | Ht 63.0 in | Wt 205.0 lb

## 2018-08-03 DIAGNOSIS — I5033 Acute on chronic diastolic (congestive) heart failure: Secondary | ICD-10-CM

## 2018-08-03 DIAGNOSIS — I10 Essential (primary) hypertension: Secondary | ICD-10-CM

## 2018-08-03 DIAGNOSIS — N183 Chronic kidney disease, stage 3 unspecified: Secondary | ICD-10-CM

## 2018-08-03 DIAGNOSIS — Z79899 Other long term (current) drug therapy: Secondary | ICD-10-CM

## 2018-08-03 DIAGNOSIS — N179 Acute kidney failure, unspecified: Secondary | ICD-10-CM | POA: Diagnosis not present

## 2018-08-03 MED ORDER — VALSARTAN 160 MG PO TABS: 160.0000 mg | ORAL_TABLET | Freq: Every day | ORAL | 3 refills | Status: DC

## 2018-08-03 MED ORDER — HYDRALAZINE HCL 50 MG PO TABS: 50.0000 mg | ORAL_TABLET | Freq: Three times a day (TID) | ORAL | 3 refills | Status: DC

## 2018-08-03 MED ORDER — FUROSEMIDE 20 MG PO TABS: 20.0000 mg | ORAL_TABLET | ORAL | 3 refills | Status: DC

## 2018-08-03 NOTE — Patient Instructions (Signed)
Medication Instructions: Take Valsartan 160 mg ONCE a day  Take lasix 20 mg Every OTHER Day   START Hydralazine 50 mg three times a day  Labwork:  BMET,Magnesium Monday 5/18  Procedures/Testing: None today  Follow-Up: Keep June apt with Dr.Branch  Any Additional Special Instructions Will Be Listed Below (If Applicable).  Call us Monday with BP readings   We have referred you to [facility], they will call you for an apt  If you need a refill on your cardiac medications before your next appointment, please call your pharmacy.

## 2018-08-03 NOTE — Progress Notes (Signed)
Clinical Summary Miranda Garcia is a 85 y.o.female seen today for follow up of the following medical problems.     1. Chronic diastolic HF - working to limit diuretic due to her renal dysfunction 04/2018 echo: LVEF 60-65%, normal RV, evidence of diastolic dysfunction    - uptrending Cr by labs, was on lasix 20mg  every other day. Changed to [employer] and Fridays - pcp lab 07/27/2018: Cr 2, BUN 44, GFR 22, K 5.1, NT proBNP 2994  - 07/28/2018 ER visit with SOB. Reported DOE and orthopnea. ER weight 205 lbs - BNP 241 (down from 488), Cr 2.64, BUN 51, WBC 6.1 Hgb 12.1 Plt 163 Trop neg - CXR cardiomegaly with vascular congestion, cardiomegaly - EKG afib rate 97  - she increased her lasix for a few days after ER visit. Symptoms have not improved.  - mild LE edema. Last lasix dose Tuesday - no wheezing, mild cough. Ongoing orthopnea, sometimes has to sit up.  - home weights 203 and stable.    2. HTN - started valsartan for HTN 2 weeks ago (May 1). Metoprolol was changed to coreg by pcp for better bp effect - from notes valsartan prevoiusly stopped due to hyperkalemia.    3. CKD - only has one kidney - previous history of large kidney stone, required nephrectomy - no NSAIDs. Cr has been steadily increasing over the last several weeks.    3. Dyspnea - extensive pulmonmary and cardiac workup over the last few months.  Jan 2020 PFTs suggestive of restriction, mildly redcued DLCO but corrects for alveolar ventilation  04/2018 echo: LVEF 60-65%, indeterminate diastolic function, reported moderate aortic stenosis - 05/2018 nuclear stress: no ischemia 05/2018 CT chest high resolution: findings suggesting of interstitial lung disease vs alveolar hemorrage.  05/2018 VQ scan: low probability PE, Patchy retention of the ventilation agent centrally may be seen with air trapping or COPD.   ER visit 05/2018 with hemoptysis and dyspnea - imaging as reported above. INR 1.9. BNP 488 Hgb 11.9  K 4 Cr 1.62    4. Aortic stenosis - 04/2018 moderate AS per report. AVA VTI 1.46, mean gradient 10. Would consider as mild AS.     5 Lung nodule - followed by pulmonary, enlarged by CT 02/2018 PFT's with no obstruction, but mild decrease in DLCO ( 14.27or 62% predicted)   6. Leg pains/weakness - bilateral leg weakness and pain. She questions if this is statin related.   - last visit stopped lipitor with improvement. We then started pravastatin 20mg  daily.    7. Afib - no symptmos, on adjusted eliquis dosing.        Past Medical History:  Diagnosis Date  . Arthritis   . Atrial fibrillation ([place])   . Diabetes mellitus type II, controlled ([place])    x 15 yrs  . Elevated troponin   . Hemorrhoids   . Hypertension   . Renal insufficiency      Allergies  Allergen Reactions  . Atorvastatin Other (See Comments)    Leg pains  . Contrast Media [Iodinated Diagnostic Agents]     Pt has 1 full functioning kidney  . Penicillins Rash     Current Outpatient Medications  Medication Sig Dispense Refill  . apixaban (ELIQUIS) 2.5 MG TABS tablet Take 1 tablet (2.5 mg total) by mouth 2 (two) times daily. 60 tablet 3  . calcium citrate-vitamin D (CITRACAL+D) 315-200 MG-UNIT per tablet Take 1 tablet by mouth daily.     . carvedilol (COREG)  12.5 MG tablet Take 12.5 mg by mouth 2 (two) times daily with a meal.    . diltiazem (CARDIZEM CD) 180 MG 24 hr capsule TAKE ONE (1) CAPSULE EACH DAY 90 capsule 1  . furosemide (LASIX) 20 MG tablet Take 1 tablet (20 mg total) by mouth every other day. 45 tablet 3  . Insulin Aspart Prot & Aspart (NOVOLOG MIX 70/30 [place]) Inject into the skin. 18 units BID per daughter    . pravastatin (PRAVACHOL) 20 MG tablet Take 1 tablet (20 mg total) by mouth every evening. 90 tablet 3  . valsartan (DIOVAN) 160 MG tablet Take 1 tablet (160 mg total) by mouth daily. 90 tablet 3  . hydrALAZINE (APRESOLINE) 50 MG tablet Take 1 tablet (50 mg total) by mouth  3 (three) times daily. 270 tablet 3   No current facility-administered medications for this visit.    Facility-Administered Medications Ordered in Other Visits  Medication Dose Route Frequency Provider Last Rate Last Dose  . neomycin-polymyxin-dexameth (MAXITROL) 0.1 % ophth ointment    PRN Tonny Chenay Nesmith, MD   1 application at 68/12/75 1427     Past Surgical History:  Procedure Laterality Date  . [place]   [facility]  . CATARACT EXTRACTION W/PHACO  01/11/2011   Procedure: CATARACT EXTRACTION PHACO AND INTRAOCULAR LENS PLACEMENT (IOC);  Surgeon: Tonny Anaysia Germer;  Location: [facility] ORS;  Service: Ophthalmology;  Laterality: Right;  CDE 19.32  . CATARACT EXTRACTION W/PHACO  01/21/2011   Procedure: CATARACT EXTRACTION PHACO AND INTRAOCULAR LENS PLACEMENT (IOC);  Surgeon: Tonny Yuma Pacella;  Location: [facility] ORS;  Service: Ophthalmology;  Laterality: Left;  CDE: 21.19  . COLONOSCOPY N/A 09/12/2013   Procedure: COLONOSCOPY;  Surgeon: Rogene Houston, MD;  Location: [facility] ENDO SUITE;  Service: Endoscopy;  Laterality: N/A;  125  . CYSTOSCOPY     [name]  . LITHOTRIPSY     [facility]  . PARTIAL HYSTERECTOMY       Allergies  Allergen Reactions  . Atorvastatin Other (See Comments)    Leg pains  . Contrast [place] [Iodinated Diagnostic Agents]     Pt has 1 full functioning kidney  . Penicillins Rash      Family History  Problem Relation Age of Onset  . Colon cancer Brother      Social History Miranda Garcia reports that she has never smoked. She has never used smokeless tobacco. Miranda Garcia reports no history of alcohol use.   Review of Systems CONSTITUTIONAL: No weight loss, fever, chills, weakness or fatigue.  HEENT: Eyes: No visual loss, blurred vision, double vision or yellow sclerae.No hearing loss, sneezing, congestion, runny nose or sore throat.  SKIN: No rash or itching.  CARDIOVASCULAR: per hpi RESPIRATORY: No shortness of breath, cough or sputum.  GASTROINTESTINAL: No anorexia, nausea,  vomiting or diarrhea. No abdominal pain or blood.  GENITOURINARY: No burning on urination, no polyuria NEUROLOGICAL: No headache, dizziness, syncope, paralysis, ataxia, numbness or tingling in the extremities. No change in bowel or bladder control.  MUSCULOSKELETAL: No muscle, back pain, joint pain or stiffness.  LYMPHATICS: No enlarged nodes. No history of splenectomy.  PSYCHIATRIC: No history of depression or anxiety.  ENDOCRINOLOGIC: No reports of sweating, cold or heat intolerance. No polyuria or polydipsia.  Miranda Garcia Kitchen   Physical Examination Vitals:   08/03/18 1148  BP: (!) 141/75  Pulse: 87  Temp: 98.3 F (36.8 C)  SpO2: 97%   Filed Weights   08/03/18 1148  Weight: 205 lb (93 kg)  Gen: resting comfortably, no acute distress HEENT: no scleral icterus, pupils equal round and reactive, no palptable cervical adenopathy,  CV Resp: Clear to auscultation bilaterally GI: abdomen is soft, non-tender, non-distended, normal bowel sounds, no hepatosplenomegaly MSK: extremities are warm, no edema.  Skin: warm, no rash Neuro:  no focal deficits Psych: appropriate affect   Assessment and Plan  1. Acute on chronic diastolic HF - management complicated by worsening renal function. Ongoing DOE and orthopnea. Recent CXR with pulm edema, elevated BNP - change lasix to 20mg  every other day. Repeat BMET/Mg on Monday   2. HTN - with rising Cr would lower her valsartan and likely stop very soon,  was restarted 2 weeks ago. Also has had some issues with hyperkalemia when previously valsartan - start hydralazine 50mg  tid.  - call us Monday with bp's, likely d/c valsartan at that time   3. AKI on CKD III-IV - worsening renal function, we will refer to nephrology. History of prior nephrectomy, has one kidney. Issues with keeping her diuresed and preserving her renal function.  - repeat labs Monday    Patient to call Monday with bp's, all will get blood work Monday. Likely d/c valsartan all  together on Monday pending bp's, room to titrate hydral and coreg. Could change coreg to labetlol in the future if needed    Arnoldo Lenis, M.D

## 2018-08-07 DIAGNOSIS — N183 Chronic kidney disease, stage 3 (moderate): Secondary | ICD-10-CM | POA: Diagnosis not present

## 2018-08-07 DIAGNOSIS — Z79899 Other long term (current) drug therapy: Secondary | ICD-10-CM | POA: Diagnosis not present

## 2018-08-08 ENCOUNTER — Telehealth: Payer: Self-pay | Admitting: *Deleted

## 2018-08-08 DIAGNOSIS — I5032 Chronic diastolic (congestive) heart failure: Secondary | ICD-10-CM

## 2018-08-08 NOTE — Telephone Encounter (Signed)
-----   Message from Arnoldo Lenis, MD sent at 08/08/2018  2:02 PM EDT ----- Kidneys have worsened. We need to stop her valsartan all together. Hold her lasix until Friday. What have her bp's been doing? Repeat a BMET on Monday.    Zandra Abts MD

## 2018-08-08 NOTE — Telephone Encounter (Signed)
Pt voiced understanding - updated medication list - pt will have labs done at [facility] (will mail lab results) routed to pcp - says BP has been "running normal" and daughter had faxed BP readings to office (will check)

## 2018-08-09 ENCOUNTER — Telehealth: Payer: Self-pay | Admitting: Cardiology

## 2018-08-09 NOTE — Telephone Encounter (Signed)
Thanks can we update family   JB

## 2018-08-09 NOTE — Telephone Encounter (Signed)
Returned call to daughter - multiple concerns that she feels like her mom is not getting any better.  Saw pmd 2wks ago & Dr. Harl Bowie on 08/03/18.  Reviewed most recent instructions on her Lasix.  She was instructed to stop the Valsartan & hold her Lasix till Friday.   She states bp doing ok.  Complains of nausea & no appetite.  Daughter thinks she is going down [place] Dau fast & seems to be worse the last few weeks.  No chest pain or dizziness.  States her mother is concerned that she can't take fluid pill till Friday.  Thinks her breathing will be worse by then.  Scheduled for f/u with JB on 09/11/2018.

## 2018-08-09 NOTE — Telephone Encounter (Addendum)
Call placed to [facility].  States referral is being reviewed by the referral coordinator & will check with her on status as soon as she comes back from lunch.  She will call us back with update.

## 2018-08-09 NOTE — Telephone Encounter (Signed)
Received a call from [facility] and referral on on providers desk for review (procider will be back in office tomorrow) and said they would contact patient this week

## 2018-08-09 NOTE — Telephone Encounter (Signed)
Daughter wants to talk to someone about how to take her lasix

## 2018-08-09 NOTE — Telephone Encounter (Signed)
She really needs to see nephrology, is there any way we can expedite the referal we placed to [facility] at our last visit   Zandra Abts MD

## 2018-08-09 NOTE — Telephone Encounter (Signed)
Left detailed message on [facility]

## 2018-08-15 DIAGNOSIS — Z0100 Encounter for examination of eyes and vision without abnormal findings: Secondary | ICD-10-CM | POA: Diagnosis not present

## 2018-08-15 DIAGNOSIS — I5032 Chronic diastolic (congestive) heart failure: Secondary | ICD-10-CM | POA: Diagnosis not present

## 2018-08-15 DIAGNOSIS — E109 Type 1 diabetes mellitus without complications: Secondary | ICD-10-CM | POA: Diagnosis not present

## 2018-08-15 NOTE — Telephone Encounter (Signed)
Can we check in on Miranda Garcia, has she heard from neprhology clinic yet? How are her symptoms doing?   Zandra Abts MD

## 2018-08-16 ENCOUNTER — Encounter: Payer: Self-pay | Admitting: *Deleted

## 2018-08-16 ENCOUNTER — Other Ambulatory Visit: Payer: Self-pay | Admitting: Nephrology

## 2018-08-16 ENCOUNTER — Ambulatory Visit
Admission: RE | Admit: 2018-08-16 | Discharge: 2018-08-16 | Disposition: A | Discharge status: Home / Self Care | Payer: Medicare HMO | Source: Ambulatory Visit | Attending: Nephrology | Admitting: Nephrology

## 2018-08-16 DIAGNOSIS — N184 Chronic kidney disease, stage 4 (severe): Secondary | ICD-10-CM

## 2018-08-16 DIAGNOSIS — D631 Anemia in chronic kidney disease: Secondary | ICD-10-CM | POA: Diagnosis not present

## 2018-08-16 DIAGNOSIS — E1122 Type 2 diabetes mellitus with diabetic chronic kidney disease: Secondary | ICD-10-CM | POA: Diagnosis not present

## 2018-08-16 DIAGNOSIS — N183 Chronic kidney disease, stage 3 (moderate): Secondary | ICD-10-CM | POA: Diagnosis not present

## 2018-08-16 DIAGNOSIS — I5032 Chronic diastolic (congestive) heart failure: Secondary | ICD-10-CM | POA: Diagnosis not present

## 2018-08-16 DIAGNOSIS — N189 Chronic kidney disease, unspecified: Secondary | ICD-10-CM | POA: Diagnosis not present

## 2018-08-16 DIAGNOSIS — N179 Acute kidney failure, unspecified: Secondary | ICD-10-CM | POA: Diagnosis not present

## 2018-08-16 DIAGNOSIS — I480 Paroxysmal atrial fibrillation: Secondary | ICD-10-CM | POA: Diagnosis not present

## 2018-08-16 DIAGNOSIS — I129 Hypertensive chronic kidney disease with stage 1 through stage 4 chronic kidney disease, or unspecified chronic kidney disease: Secondary | ICD-10-CM | POA: Diagnosis not present

## 2018-08-16 NOTE — Telephone Encounter (Signed)
Spoke with Katharine Look and pt had appt today with [facility] who ordered ultrasound/labs and possible biopsy of kidney pending ultrasound results. Pt is still SOB/swollen/says BP has been WNL HR 80s-90s - had labs we ordered done yesterday at [facility] (will request) has f/u appt with [facility] 08/30/18

## 2018-08-17 ENCOUNTER — Telehealth: Payer: Self-pay | Admitting: *Deleted

## 2018-08-17 NOTE — Telephone Encounter (Signed)
Daughter aware - pt taking lasix every other day and is supposed to await further instructions from [facility] on adjusting lasix today - pt is still SOB this morning, daughter will let us know what kidney doctor recs are regarding lasix - routed to [facility]

## 2018-08-17 NOTE — Telephone Encounter (Signed)
-----   Message from Arnoldo Lenis, MD sent at 08/17/2018 10:44 AM EDT ----- Slight improvement in kidney function, please forward these results to her MD with [facility]. Would continue to try to limit her lasix as best she can to take some stress of the kidneys and continue ongoing workup with the kidney docs. Has she resumed taking the lasix every other day? How is her breathing doing?  Zandra Abts MD

## 2018-08-18 ENCOUNTER — Telehealth: Payer: Self-pay | Admitting: *Deleted

## 2018-08-18 DIAGNOSIS — I10 Essential (primary) hypertension: Secondary | ICD-10-CM | POA: Diagnosis not present

## 2018-08-18 NOTE — Telephone Encounter (Signed)
Pt daughter called and says per labwork from Dr Hollie Salk [facility]) done Wednesday creatinine was 2.84. pt was instructed to take lasix daily for 4 days then resume every other day. Today pt is having a hard time breathing and c.o increased SOB cannot lay on her back and hands and feet are swollen. Daughter is concerned and wanted to know if anything else could be done medication wise to relieve SOB - daughter has also LM with Dr Hollie Salk as well but hasn't received return call

## 2018-08-18 NOTE — Telephone Encounter (Signed)
I would defer the use of diuretics from here on out to her nephrologist, hopefully as she uses more regularly over the next few days her symptoms will improve. From a heart failiure standpoint only way to get the fluid removed is through using diuretics. WIth her kidney dysfunction, Dr Hollie Salk would be the better resource as far as handling the dosing.   Zandra Abts MD

## 2018-08-18 NOTE — Telephone Encounter (Signed)
Pt daughter voiced understanding - hasn't gotten a call back from [facility] as of yet

## 2018-08-21 DIAGNOSIS — I509 Heart failure, unspecified: Secondary | ICD-10-CM

## 2018-08-21 HISTORY — DX: Heart failure, unspecified: I50.9

## 2018-08-22 ENCOUNTER — Telehealth: Payer: Self-pay | Admitting: Pulmonary Disease

## 2018-08-22 ENCOUNTER — Telehealth: Payer: Self-pay | Admitting: Cardiology

## 2018-08-22 NOTE — Telephone Encounter (Signed)
Please give pt's daughter Katharine Look a call @[phone]

## 2018-08-22 NOTE — Telephone Encounter (Signed)
Returned pt's daughters call. She states that it seems the pt is continuously declining with all of the issues she has going on. She has no energy, her breathing is getting worse, and she is beginning to have more chest tightness. Pt's daughter Miranda Garcia) said that Dr. Hollie Salk had increased lasix to 20 mg daily this past Thursday-Sunday and it did not help at all. She will contact nephrology to inform them as well. She has an appointment with a pulmonary tomorrow. Miranda Garcia wanted to inform Dr. Harl Bowie that her mother is not doing any better and to also update him on diuretic use. Will forward to Dr. Harl Bowie as an Juluis Rainier.

## 2018-08-22 NOTE — Telephone Encounter (Signed)
Called spoke with patient's daughter Katharine Look who reports that patient's kidney disease and heart failure have been steadily worsening and pt has been having increasing SHOB x5 weeks.  Pt saw cardiology on 5.14.2020 w/ cxr, labs and med adjustments.  Pt was referred to nephrology and saw them last week.  Per Katharine Look, nephrology has recommended that patient follow back up with this office.  Pt is scheduled to see nephrology again on 6.10.2020 for follow up.  Katharine Look is really just wanting to know if all of this is to be expected with patient's diagnoses and will it continue or is this something that may be able to be treated with some improvement in quality of life.  Katharine Look feels like they are being "bounced around" and would like to speak with Pulmonary about pt's symptoms.  Appt scheduled with Beth NP for tomorrow 6.3.2020 @ 1530.  COVID screening negative with NO travel, NO new SHOB, NO new cough, NO sore throat, NO f/c/s, NO n/v/d, NO abd pain, NO muscle pain, NO loss of smell or taste.

## 2018-08-22 NOTE — Telephone Encounter (Signed)
Pt's daughter notified. She will see how she does tonight and go from there.

## 2018-08-22 NOTE — Telephone Encounter (Signed)
THanks for update. From our recent extensive cardiac testing the only thing that has come back abnormal is some diastolic heart failure which she has had before. The treatment for this is diuretics, but due to kidney issues I have deferred the use of diuretics to renal. She may discuss with renal and pulmonary if coming to the hospital would help expedite there workup and treatment, cardiac wise its mainly finding a way to get fluid off her without overstressing the kidneys which renal is helping guide. If she is having severe worsening symptoms I would suggest she come to the hospital.    Miranda Abts MD

## 2018-08-23 ENCOUNTER — Other Ambulatory Visit: Payer: Self-pay

## 2018-08-23 ENCOUNTER — Encounter: Payer: Self-pay | Admitting: Primary Care

## 2018-08-23 ENCOUNTER — Ambulatory Visit (INDEPENDENT_AMBULATORY_CARE_PROVIDER_SITE_OTHER): Payer: Medicare HMO

## 2018-08-23 ENCOUNTER — Ambulatory Visit: Payer: Medicare HMO | Admitting: Primary Care

## 2018-08-23 VITALS — BP 140/62 | HR 89 | Temp 97.0°F | Ht 63.0 in | Wt 207.8 lb

## 2018-08-23 DIAGNOSIS — R0602 Shortness of breath: Secondary | ICD-10-CM

## 2018-08-23 DIAGNOSIS — J849 Interstitial pulmonary disease, unspecified: Secondary | ICD-10-CM

## 2018-08-23 DIAGNOSIS — R0609 Other forms of dyspnea: Secondary | ICD-10-CM | POA: Diagnosis not present

## 2018-08-23 NOTE — Progress Notes (Signed)
@Patient  ID: Miranda Garcia, female    DOB: Jan 02, 1931, 85 y.o.   MRN: [id number]  Chief Complaint  Patient presents with  . Follow-up    Increased SOB, wheezing,     Referring provider: Glenda Chroman, MD  HPI: 85 year old female, never smoked. PMH significant for afib, chronic diastolic CHF, CKD stage 3, diabetes, pulmonary nodules. Patient of Dr. Valeta Harms, seen for initial consult on 03/31/18.   Previous [place] encounters: 03/31/18- Consult, Dr. Valeta Harms PMH of atrial fibrillation and DMII. She was told that she had abnormal office spirometry in the past.  Additionally she has a history of atrial fibrillation on Coumadin for the past several years.  She also has a history of kidney stones with a prior obstructed kidney requiring surgery for this.  Currently only has 1 functional kidney per the patient.  Prior CT imaging was completed in 2004 which revealed a 10 x 10 mm lung nodule in the left base.  Follow-up imaging in December evaluated for basilar changes in the chest x-ray that was completed due to shortness of breath by her primary care led to CT imaging in December 2019.  The CT scan completed this December revealed enlargement of this lower lobe nodule.  Now measuring 13 x 15 mm.  Patient presents today in the office with complaints of ongoing dyspnea.  Her dyspnea is predominantly associated with exertion.  She does not complain of any chest pain.  Of note her CT scan of the chest revealed significant coronary calcifications and evidence of atherosclerosis.  04/17/18- OV, PFTs  Pt. Presents for review of PFT's. PFT's are as noted below. No obstruction per testing, but mild decrease in DLCO.HGB 13.0 day of PFT's She states she has had dyspnea for several years, but it became  significantly worse after her bout with pneumonia about 1 year ago.She has a dry cough, that she states is mild She has been on coumadin x 5 years for atrial fibrillation. Body mass index is 35.96 kg/m. Per her own  accounting, she is not very active.She denies fever, chest pain, orthopnea or hemoptysis.  05/31/2018- Acute visit, hemoptysis Patient presents today for an acute visit with complaints of hemoptysis x 2 days. Associated dyspnea. Accompanied by her daughter. She went to the ED for evaluation on 05/29/18. CTA negative for PE, unchanged large pulmonary nodule in LLL. She was instructed to hold her coumadin for two days. She has not had any further episodes of hemoptysis. Last time she coughed up blood was Monday morning 3/9. States that she experienced gurgling in the morning prior to coughing up blood. Has an apt with cardiology next week. Denies URI symptoms or recently feeling sick. No recent travel or fevers.   08/23/2018  Patient followed by our office for LLL nodule measuring 13x43mm, Dr. Valeta Harms suspects nodule is benign but due to size and slow growth rate differential diagnosis includes carcinoid tumor. Patient elected not to pursue invasive procedure to remove. Follow with HRCT in March showed multiple small pulmonary nodules scattered throughout both lungs, similar in size and number. There were findings in the lung to suggest interstitial lung disease but given her history of hemoptysis findings could have simply reflected alveolar hemorrhage.   Seen by cardiology in May for management of acute on chronic diastolic heart failure.  She continues to have dyspnea, management complicated by worsening renal function.  Recent chest x-ray showed pulmonary edema and BNP was elevated.  She was referred to nephrology, history of prior  nephrectomy.  Renal function has steadily declined currently creatinine has been between 2.04- 3.46.  Her valsartan was stopped and she was started on Coreg and hydralazine.  She had a consult with with Dr. Hollie Salk on May 27 from [facility].  She had repeat labs with nephrology, creatinine improved at 2.8.  Dr. Hollie Salk increased patient's Lasix to 20 mg daily.  Other  serology work-up was negative.  Including ANCA and ANA.  Patient presents today with daughter to discuss ongoing shortness of breath x 5 weeks. She took 40mg  lasix daily from 5/28-5/31 and by Sunday she started feeling better and was able to sleep. She resumed 20mg  daily and shortness of breath symptoms returned. Reports left leg swelling > right. Asking about potentially getting hospital bed for home. Using 6-7 pillows under her head at night d/t orthopnea. No trouble getting out of bed. Unable to walk more than 15-15 ft without getting short of breath. Uses walker at home. No further hemoptysis. Ambulatory oxygen saturation normal today. Will recheck creatinine today. She is not interested in dialysis. Discussed palliative care options moving forward, advised she discuss further after nephrology apt on 6/10.    Test Results: PFT's 04/17/2018 FVC 1.57 (69%), FEV1 1.21( 72%), F/F ratio 77 (106%), DLCO 14.27 (65%)  Interpretation No obstruction, Mild restriction, Mild reduction  in DLCO 62%  Imaging: 05/29/18- CTA showed unchanged large pulmonary nodule in the LLL mesauring 1.5 x 1.2 cm and 30mm LLL nodule. Several other small pulmonary nodules throughout the lungs bilaterally. Areas of ground-glass attenuation. NO honeycombing. ? R base ILD vs atelectasis. Findings consistent with UIP. Recommend repeating in 6-12 months to assess for changes.  CXR 02/24/2018 Cardiomegaly.  Low lung volumes.  No acute findings.  CT Chest 02/2018>> Small basilar peripheral groundglass changes  likely represents atelectasis 20-25  pulmonary nodules the largest of which is on the left which is 13 x 15 mm ( was 10 mm x 10 mm in 2004) Right lung base with ground glass changes that may represent ILD changes, area of consolidation spanning 35mm x 10 mm .  10/2016>>Echo Left ventricle: The cavity size was normal. Wall thickness was increased in a pattern of mild LVH. Systolic function was vigorous. The estimated ejection  fraction was in the range of 65% to 70%. Wall motion was normal; there were no regional wall motion abnormalities. Doppler parameters are consistent with abnormal left ventricular relaxation (grade 1 diastolic dysfunction). Doppler parameters are consistent with high ventricular filling pressure. Aortic valve: Mildly calcified annulus. Trileaflet; mildly thickened leaflets. Valve area (VTI): 1.56 cm^2. Valve area (Vmax): 1.7 cm^2. Mitral valve: Mildly calcified annulus. Mildly thickened leaflets. Left atrium: The atrium was moderately dilated.Technically adequate study.    Allergies  Allergen Reactions  . Atorvastatin Other (See Comments)    Leg pains  . Contrast Media [Iodinated Diagnostic Agents]     Pt has 1 full functioning kidney  . Penicillins Nausea And Vomiting and Rash    Did it involve swelling of the face/tongue/throat, SOB, or low BP? No Did it involve sudden or severe rash/hives, skin peeling, or any reaction on the inside of your mouth or nose? Yes Did you need to seek medical attention at a hospital or doctor's office? Yes When did it last happen?65 yrs ago If all above answers are "NO", may proceed with cephalosporin use.     Immunization History  Administered Date(s) Administered  . Influenza, High Dose Seasonal PF 12/29/2017  . Influenza,inj,Quad PF,6+ Mos 12/25/2013, 12/30/2015  .  Influenza,inj,quad, With Preservative 02/07/2017    Past Medical History:  Diagnosis Date  . Arthritis   . Atrial fibrillation ([place])   . CHF (congestive heart failure) ([place])   . Diabetes mellitus type II, controlled ([place])    x 15 yrs  . Elevated troponin   . Hemorrhoids   . Hypertension   . Renal insufficiency     Tobacco History: Social History   Tobacco Use  Smoking Status Never Smoker  Smokeless Tobacco Never Used   Counseling given: Not Answered   Facility-Administered Medications Prior to Visit  Medication Dose Route Frequency Provider Last Rate Last Dose  .  neomycin-polymyxin-dexameth (MAXITROL) 0.1 % ophth ointment    PRN Tonny Branch, MD   1 application at 37/62/83 1427   Outpatient Medications Prior to Visit  Medication Sig Dispense Refill  . apixaban (ELIQUIS) 2.5 MG TABS tablet Take 1 tablet (2.5 mg total) by mouth 2 (two) times daily. 60 tablet 3  . calcium citrate-vitamin D (CITRACAL+D) 315-200 MG-UNIT per tablet Take 1 tablet by mouth daily.     . carvedilol (COREG) 12.5 MG tablet Take 12.5 mg by mouth 2 (two) times daily with a meal.    . diltiazem (CARDIZEM CD) 180 MG 24 hr capsule TAKE ONE (1) CAPSULE EACH DAY (Patient taking differently: Take 180 mg by mouth daily. ) 90 capsule 1  . furosemide (LASIX) 20 MG tablet Take 1 tablet (20 mg total) by mouth every other day. 45 tablet 3  . hydrALAZINE (APRESOLINE) 50 MG tablet Take 1 tablet (50 mg total) by mouth 3 (three) times daily. 270 tablet 3  . Insulin Aspart Prot & Aspart (NOVOLOG MIX 70/30 [place]) Inject 18 Units into the skin 2 (two) times a day.     . pravastatin (PRAVACHOL) 20 MG tablet Take 1 tablet (20 mg total) by mouth every evening. 90 tablet 3    Review of Systems  Review of Systems  Respiratory: Positive for shortness of breath. Negative for cough and wheezing.    Physical Exam  BP 140/62 (BP Location: Right Arm, Cuff Size: Large)   Pulse 89   Temp (!) 97 F (36.1 C)   Ht 5\' 3"  (1.6 m)   Wt 207 lb 12.8 oz (94.3 kg)   SpO2 94%   BMI 36.81 kg/m  Physical Exam Constitutional:      Appearance: Normal appearance.  HENT:     Head: Normocephalic and atraumatic.  Cardiovascular:     Rate and Rhythm: Normal rate.     Comments: + 1 BLE edema Pulmonary:     Effort: Pulmonary effort is normal.     Breath sounds: No wheezing.     Comments: Crackles lower bases. No shortness of breath at rest. Mild-moderate dyspnea and fatigue with ambulation.  Musculoskeletal: Normal range of motion.  Neurological:     General: No focal deficit present.     Mental Status: She is alert  and oriented to person, place, and time. Mental status is at baseline.  Psychiatric:        Mood and Affect: Mood normal.        Behavior: Behavior normal.        Thought Content: Thought content normal.        Judgment: Judgment normal.      Lab Results:  CBC    Component Value Date/Time   WBC 5.9 08/26/2018 0655   RBC 3.31 (L) 08/26/2018 0655   RBC 3.31 (L) 08/26/2018 0655   HGB 10.1 (  L) 08/26/2018 0655   HCT 31.6 (L) 08/26/2018 0655   PLT 127 (L) 08/26/2018 0655   MCV 95.5 08/26/2018 0655   MCH 30.5 08/26/2018 0655   MCHC 32.0 08/26/2018 0655   RDW 13.6 08/26/2018 0655   LYMPHSABS 1.6 07/28/2018 1916   MONOABS 1.0 07/28/2018 1916   EOSABS 0.2 07/28/2018 1916   BASOSABS 0.1 07/28/2018 1916    BMET    Component Value Date/Time   NA 138 08/28/2018 0537   K 3.9 08/28/2018 0537   CL 106 08/28/2018 0537   CO2 22 08/28/2018 0537   GLUCOSE 153 (H) 08/28/2018 0537   BUN 68 (H) 08/28/2018 0537   CREATININE 4.10 (H) 08/28/2018 0537   CALCIUM 8.2 (L) 08/28/2018 0537   GFRNONAA 9 (L) 08/28/2018 0537   GFRAA 11 (L) 08/28/2018 0537    BNP    Component Value Date/Time   BNP 466.9 (H) 08/25/2018 1840    ProBNP    Component Value Date/Time   PROBNP 466.0 (H) 05/31/2018 1621    Imaging: Dg Chest 2 View  Result Date: 08/23/2018 CLINICAL DATA:  Acute shortness of breath EXAM: CHEST - 2 VIEW COMPARISON:  07/28/2018 FINDINGS: Cardiac shadow is mildly enlarged but stable. Aortic calcifications are seen. Small bilateral pleural effusions are noted. No focal infiltrate is seen. Degenerative changes of the thoracic spine are noted. IMPRESSION: Small bilateral pleural effusions. No other focal abnormality is noted. Electronically Signed   By: Inez Catalina M.D.   On: 08/23/2018 15:34   US Renal  Result Date: 08/16/2018 CLINICAL DATA:  CKD stage 4 EXAM: RENAL / URINARY TRACT ULTRASOUND COMPLETE COMPARISON:  No recent relevant prior available for comparison. FINDINGS: Right Kidney:  Renal measurements: 10.2 x 4.6 x 5.5 cm = volume: 134 mL. There is a complex 7 mm nodule in the upper pole the right kidney. The echogenicity is normal. Left Kidney: Renal measurements: 7.4 x 3.1 x 3.5 cm = volume: 41 mL. The echogenicity is normal. There is some cortical thinning. There is a slightly complex 1 cm nodule Arising from the interpolar region. Bladder: The bladder was not definitely visualized. IMPRESSION: 1. No acute sonographic abnormality detected. There is no hydronephrosis. 2. Slightly complex bilateral renal nodules favored to represent proteinaceous cysts. A 6 month follow-up ultrasound can be performed to confirm stability. 3. Bladder not visualized. 4. Cortical thinning of the left kidney which can be seen in patients with medical renal disease. Electronically Signed   By: Constance Holster M.D.   On: 08/16/2018 20:59   Dg Chest Port 1 View  Result Date: 08/25/2018 CLINICAL DATA:  Shortness of breath.  History of CHF. EXAM: PORTABLE CHEST 1 VIEW COMPARISON:  Chest x-ray dated August 23, 2018. FINDINGS: Stable mild cardiomegaly. Normal mediastinal contours. Atherosclerotic calcification of the aortic arch. Normal pulmonary vascularity. Unchanged small bilateral pleural effusions and mild bibasilar atelectasis. No pneumothorax. No acute osseous abnormality. IMPRESSION: 1. Unchanged small bilateral pleural effusions and mild bibasilar atelectasis. Electronically Signed   By: Titus Dubin M.D.   On: 08/25/2018 18:23     Assessment & Plan:   Dyspnea - No pulmonary cause identified to suggest persistent dyspnea. Most likely multifactorial d/t diastolic CHF and CKD stage 3-4. SOB improved with diuretic but despite worsening kidney function patient is unable to increase lasix dosing -  CXR today showed small bilateral pleural effusions and mild cardiomegaly. No other focal abnormality.  - O2 sat 94% on Room air (she did not qualify for oxygen at this  time) - Will repeat HRCT to r/o ILD -  Follow up with nephrology and cardiology - Return in 1 month for scheduled visit with Dr. Valeta Harms   Addendum: BMET 6/4 came back critical lab result of GFR 13.85, BUN 56, Creatinine 3.18. Needs to notify nephrologist of worsening kidney function- results to be routed.   Martyn Ehrich, NP 08/28/2018

## 2018-08-23 NOTE — Patient Instructions (Addendum)
Testing: - CXR today showed small bilateral pleural effusions and mild cardiomegaly. No other focal abnormality.  - O2 sat 94% on Room air - does not qualify for oxygen at this time   Recommendations: - Continue Lasix 20mg  daily - to be managed by nephrology  - Wear compression stockings during the day   Orders: - Can we get HRCT scheduled sooner please / Or change to CT chest w/o contrast re: dyspnea  - Can we look into getting patient a hospital bed re: orthopnea   Follow up: - Dr. Valeta Harms in 3-4 months or as needed

## 2018-08-24 ENCOUNTER — Telehealth: Payer: Self-pay

## 2018-08-24 LAB — BASIC METABOLIC PANEL
BUN: 56 mg/dL — ABNORMAL HIGH (ref 6–23)
CO2: 20 mEq/L (ref 19–32)
Calcium: 8.7 mg/dL (ref 8.4–10.5)
Chloride: 106 mEq/L (ref 96–112)
Creatinine, Ser: 3.18 mg/dL — ABNORMAL HIGH (ref 0.40–1.20)
GFR: 13.85 mL/min — CL (ref >60.00)
Glucose, Bld: 181 mg/dL — ABNORMAL HIGH (ref 70–99)
Potassium: 3.5 mEq/L (ref 3.5–5.1)
Sodium: 139 mEq/L (ref 135–145)

## 2018-08-24 NOTE — Telephone Encounter (Signed)
Miranda Garcia, phlebotomy  received critical value report from lab and requested this be routed to a provider for review.  August Saucer, NP unavailable this afternoon. Routed to T. Nils Pyle, NP for review and recommendations.   Critical value GFR 13.85 - see full report below.  TONYA please review and advise   Panel (BMET)  Order: [id number]  Status:  Final result  Visible to patient:  No (Not Released)  Next appt:  09/06/2018 at 05:00 PM in Radiology (AP-CT 1)  Dx:  DOE (dyspnea on exertion)   Ref Range & Units 1d ago  Sodium 135 - 145 mEq/L 139   Potassium 3.5 - 5.1 mEq/L 3.5   Chloride 96 - 112 mEq/L 106   CO2 19 - 32 mEq/L 20   Glucose, Bld 70 - 99 mg/dL 181High    BUN 6 - 23 mg/dL 56High    Creatinine, Ser 0.40 - 1.20 mg/dL 3.18High    Calcium 8.4 - 10.5 mg/dL 8.7   GFR >60.00 mL/min 13.85Low Panic    Resulting Agency  [facility]    Narrative  Performed by: Zannie Kehr  Critical result called to [facility] on 08/24/2018 3:05 PM by [place], Saa. Results were read back to caller. (GFR)    Specimen Collected: 08/23/18 16:33 Last Resulted: 08/24/18 15:05

## 2018-08-24 NOTE — Telephone Encounter (Signed)
Agree with recommendations from Wyn Quaker. Thanks.

## 2018-08-24 NOTE — Assessment & Plan Note (Addendum)
-   No pulmonary cause identified to suggest persistent dyspnea. Most likely multifactorial d/t diastolic CHF and CKD stage 3-4. SOB improved with diuretic but despite worsening kidney function patient is unable to increase lasix dosing -  CXR today showed small bilateral pleural effusions and mild cardiomegaly. No other focal abnormality.  - O2 sat 94% on Room air (she did not qualify for oxygen at this time) - Will repeat HRCT to r/o ILD - Follow up with nephrology and cardiology - Return in 1 month for scheduled visit with Dr. Valeta Harms

## 2018-08-24 NOTE — Telephone Encounter (Signed)
08/24/2018 1525  Spoke with Derl Barrow, NP regarding this patient's critical lab results.  Patient with known reduced GFR she is followed by nephrology.  Will route to [employer] as FYI if additional follow-up is needed she can do that on 08/25/2018 on return to office.  Patient does need to notify her nephrologist that her kidney functioning is worsening.  Please route/fax these results to her nephrologist.  Wyn Quaker FNP

## 2018-08-24 NOTE — Telephone Encounter (Signed)
Contacted patient by phone to relay recommendations by B. Warner Mccreedy, NP.  Patient acknowledged understanding and she will contact Dr. Hollie Salk at [facility] and results have been faxed to their office at [phone].  Nothing further needed.

## 2018-08-25 ENCOUNTER — Other Ambulatory Visit: Payer: Self-pay

## 2018-08-25 ENCOUNTER — Encounter (HOSPITAL_COMMUNITY): Payer: Self-pay

## 2018-08-25 ENCOUNTER — Inpatient Hospital Stay (HOSPITAL_COMMUNITY)
Admission: EM | Admit: 2018-08-25 | Discharge: 2018-08-31 | DRG: 291 | Disposition: A | Discharge status: Home / Self Care | Payer: Medicare HMO | Attending: Internal Medicine | Admitting: Internal Medicine

## 2018-08-25 ENCOUNTER — Telehealth: Payer: Self-pay | Admitting: Cardiology

## 2018-08-25 ENCOUNTER — Emergency Department (HOSPITAL_COMMUNITY): Payer: Medicare HMO

## 2018-08-25 ENCOUNTER — Telehealth: Payer: Self-pay

## 2018-08-25 DIAGNOSIS — Z20828 Contact with and (suspected) exposure to other viral communicable diseases: Secondary | ICD-10-CM | POA: Diagnosis present

## 2018-08-25 DIAGNOSIS — I130 Hypertensive heart and chronic kidney disease with heart failure and stage 1 through stage 4 chronic kidney disease, or unspecified chronic kidney disease: Principal | ICD-10-CM | POA: Diagnosis present

## 2018-08-25 DIAGNOSIS — D696 Thrombocytopenia, unspecified: Secondary | ICD-10-CM

## 2018-08-25 DIAGNOSIS — I5043 Acute on chronic combined systolic (congestive) and diastolic (congestive) heart failure: Secondary | ICD-10-CM

## 2018-08-25 DIAGNOSIS — R918 Other nonspecific abnormal finding of lung field: Secondary | ICD-10-CM | POA: Diagnosis present

## 2018-08-25 DIAGNOSIS — N179 Acute kidney failure, unspecified: Secondary | ICD-10-CM | POA: Diagnosis present

## 2018-08-25 DIAGNOSIS — R0609 Other forms of dyspnea: Secondary | ICD-10-CM | POA: Diagnosis not present

## 2018-08-25 DIAGNOSIS — E119 Type 2 diabetes mellitus without complications: Secondary | ICD-10-CM | POA: Diagnosis not present

## 2018-08-25 DIAGNOSIS — E669 Obesity, unspecified: Secondary | ICD-10-CM | POA: Diagnosis present

## 2018-08-25 DIAGNOSIS — I5033 Acute on chronic diastolic (congestive) heart failure: Secondary | ICD-10-CM | POA: Diagnosis present

## 2018-08-25 DIAGNOSIS — I4891 Unspecified atrial fibrillation: Secondary | ICD-10-CM | POA: Diagnosis not present

## 2018-08-25 DIAGNOSIS — Z7901 Long term (current) use of anticoagulants: Secondary | ICD-10-CM

## 2018-08-25 DIAGNOSIS — Q600 Renal agenesis, unilateral: Secondary | ICD-10-CM

## 2018-08-25 DIAGNOSIS — E785 Hyperlipidemia, unspecified: Secondary | ICD-10-CM | POA: Diagnosis present

## 2018-08-25 DIAGNOSIS — Z90710 Acquired absence of both cervix and uterus: Secondary | ICD-10-CM

## 2018-08-25 DIAGNOSIS — R0602 Shortness of breath: Secondary | ICD-10-CM | POA: Diagnosis not present

## 2018-08-25 DIAGNOSIS — Z91041 Radiographic dye allergy status: Secondary | ICD-10-CM

## 2018-08-25 DIAGNOSIS — Z79899 Other long term (current) drug therapy: Secondary | ICD-10-CM

## 2018-08-25 DIAGNOSIS — Z6836 Body mass index (BMI) 36.0-36.9, adult: Secondary | ICD-10-CM

## 2018-08-25 DIAGNOSIS — N184 Chronic kidney disease, stage 4 (severe): Secondary | ICD-10-CM | POA: Diagnosis present

## 2018-08-25 DIAGNOSIS — Z794 Long term (current) use of insulin: Secondary | ICD-10-CM

## 2018-08-25 DIAGNOSIS — D638 Anemia in other chronic diseases classified elsewhere: Secondary | ICD-10-CM | POA: Diagnosis not present

## 2018-08-25 DIAGNOSIS — R8271 Bacteriuria: Secondary | ICD-10-CM | POA: Diagnosis not present

## 2018-08-25 DIAGNOSIS — I482 Chronic atrial fibrillation, unspecified: Secondary | ICD-10-CM | POA: Diagnosis present

## 2018-08-25 DIAGNOSIS — Z880 Allergy status to penicillin: Secondary | ICD-10-CM

## 2018-08-25 DIAGNOSIS — E876 Hypokalemia: Secondary | ICD-10-CM | POA: Diagnosis present

## 2018-08-25 DIAGNOSIS — E1122 Type 2 diabetes mellitus with diabetic chronic kidney disease: Secondary | ICD-10-CM | POA: Diagnosis present

## 2018-08-25 DIAGNOSIS — E611 Iron deficiency: Secondary | ICD-10-CM | POA: Diagnosis present

## 2018-08-25 DIAGNOSIS — D631 Anemia in chronic kidney disease: Secondary | ICD-10-CM | POA: Diagnosis not present

## 2018-08-25 DIAGNOSIS — Z888 Allergy status to other drugs, medicaments and biological substances status: Secondary | ICD-10-CM

## 2018-08-25 LAB — URINALYSIS, ROUTINE W REFLEX MICROSCOPIC
Bacteria, UA: NONE SEEN
Bilirubin Urine: NEGATIVE
Glucose, UA: NEGATIVE mg/dL
Hgb urine dipstick: NEGATIVE
Ketones, ur: NEGATIVE mg/dL
Nitrite: NEGATIVE
Protein, ur: NEGATIVE mg/dL
Specific Gravity, Urine: 1.011 (ref 1.005–1.030)
pH: 5 (ref 5.0–8.0)

## 2018-08-25 LAB — CBC
HCT: 34.1 % — ABNORMAL LOW (ref 36.0–46.0)
Hemoglobin: 10.8 g/dL — ABNORMAL LOW (ref 12.0–15.0)
MCH: 30.2 pg (ref 26.0–34.0)
MCHC: 31.7 g/dL (ref 30.0–36.0)
MCV: 95.3 fL (ref 80.0–100.0)
Platelets: 123 10*3/uL — ABNORMAL LOW (ref 150–400)
RBC: 3.58 MIL/uL — ABNORMAL LOW (ref 3.87–5.11)
RDW: 13.3 % (ref 11.5–15.5)
WBC: 7.3 10*3/uL (ref 4.0–10.5)
nRBC: 0 % (ref 0.0–0.2)

## 2018-08-25 LAB — COMPREHENSIVE METABOLIC PANEL
ALT: 13 U/L (ref 0–44)
AST: 18 U/L (ref 15–41)
Albumin: 3.3 g/dL — ABNORMAL LOW (ref 3.5–5.0)
Alkaline Phosphatase: 56 U/L (ref 38–126)
Anion gap: 10 (ref 5–15)
BUN: 56 mg/dL — ABNORMAL HIGH (ref 8–23)
CO2: 21 mmol/L — ABNORMAL LOW (ref 22–32)
Calcium: 8.9 mg/dL (ref 8.9–10.3)
Chloride: 110 mmol/L (ref 98–111)
Creatinine, Ser: 3.2 mg/dL — ABNORMAL HIGH (ref 0.44–1.00)
GFR calc Af Amer: 15 mL/min — ABNORMAL LOW (ref >60)
GFR calc non Af Amer: 13 mL/min — ABNORMAL LOW (ref >60)
Glucose, Bld: 182 mg/dL — ABNORMAL HIGH (ref 70–99)
Potassium: 3.7 mmol/L (ref 3.5–5.1)
Sodium: 141 mmol/L (ref 135–145)
Total Bilirubin: 1.2 mg/dL (ref 0.3–1.2)
Total Protein: 6.4 g/dL — ABNORMAL LOW (ref 6.5–8.1)

## 2018-08-25 LAB — SARS CORONAVIRUS 2 BY RT PCR (HOSPITAL ORDER, PERFORMED IN ~~LOC~~ HOSPITAL LAB): SARS Coronavirus 2: NEGATIVE

## 2018-08-25 LAB — TROPONIN I: Troponin I: <0.03 ng/mL (ref <0.03)

## 2018-08-25 LAB — BRAIN NATRIURETIC PEPTIDE: B Natriuretic Peptide: 466.9 pg/mL — ABNORMAL HIGH (ref 0.0–100.0)

## 2018-08-25 LAB — GLUCOSE, CAPILLARY: Glucose-Capillary: 138 mg/dL — ABNORMAL HIGH (ref 70–99)

## 2018-08-25 MED ORDER — SODIUM CHLORIDE 0.9% FLUSH
3.0000 mL | Freq: Two times a day (BID) | INTRAVENOUS | Status: DC
  Administered 2018-08-25 – 2018-08-30 (×10): 3 mL via INTRAVENOUS

## 2018-08-25 MED ORDER — HYDRALAZINE HCL 50 MG PO TABS
50.0000 mg | ORAL_TABLET | Freq: Three times a day (TID) | ORAL | Status: DC
  Administered 2018-08-26 – 2018-08-27 (×4): 50 mg via ORAL
  Filled 2018-08-25 (×4): qty 1

## 2018-08-25 MED ORDER — INSULIN ASPART 100 UNIT/ML ~~LOC~~ SOLN
0.0000 [IU] | Freq: Three times a day (TID) | SUBCUTANEOUS | Status: DC
  Administered 2018-08-26: 3 [IU] via SUBCUTANEOUS
  Administered 2018-08-26 – 2018-08-28 (×5): 2 [IU] via SUBCUTANEOUS
  Administered 2018-08-28 – 2018-08-29 (×2): 1 [IU] via SUBCUTANEOUS
  Administered 2018-08-29 (×2): 2 [IU] via SUBCUTANEOUS
  Administered 2018-08-30 (×2): 1 [IU] via SUBCUTANEOUS
  Administered 2018-08-31: 3 [IU] via SUBCUTANEOUS

## 2018-08-25 MED ORDER — SODIUM CHLORIDE 0.9 % IV SOLN: 250.0000 mL | INTRAVENOUS | Status: DC | PRN

## 2018-08-25 MED ORDER — FUROSEMIDE 10 MG/ML IJ SOLN
40.0000 mg | Freq: Once | INTRAMUSCULAR | Status: AC
  Administered 2018-08-25: 40 mg via INTRAVENOUS
  Filled 2018-08-25: qty 4

## 2018-08-25 MED ORDER — SODIUM CHLORIDE 0.9 % IV SOLN
INTRAVENOUS | Status: DC
  Administered 2018-08-25: 22:00:00 via INTRAVENOUS

## 2018-08-25 MED ORDER — PRAVASTATIN SODIUM 10 MG PO TABS
20.0000 mg | ORAL_TABLET | Freq: Every day | ORAL | Status: DC
  Administered 2018-08-25 – 2018-08-30 (×6): 20 mg via ORAL
  Filled 2018-08-25 (×6): qty 2

## 2018-08-25 MED ORDER — SODIUM CHLORIDE 0.9% FLUSH: 3.0000 mL | INTRAVENOUS | Status: DC | PRN

## 2018-08-25 MED ORDER — INSULIN ASPART 100 UNIT/ML ~~LOC~~ SOLN
0.0000 [IU] | Freq: Every day | SUBCUTANEOUS | Status: DC
  Administered 2018-08-27: 2 [IU] via SUBCUTANEOUS

## 2018-08-25 MED ORDER — INSULIN DETEMIR 100 UNIT/ML ~~LOC~~ SOLN
9.0000 [IU] | Freq: Every day | SUBCUTANEOUS | Status: DC
  Administered 2018-08-25 – 2018-08-30 (×6): 9 [IU] via SUBCUTANEOUS
  Filled 2018-08-25 (×7): qty 0.09

## 2018-08-25 MED ORDER — ONDANSETRON HCL 4 MG/2ML IJ SOLN: 4.0000 mg | Freq: Four times a day (QID) | INTRAMUSCULAR | Status: DC | PRN

## 2018-08-25 MED ORDER — APIXABAN 2.5 MG PO TABS
2.5000 mg | ORAL_TABLET | Freq: Two times a day (BID) | ORAL | Status: DC
  Administered 2018-08-25 – 2018-08-29 (×8): 2.5 mg via ORAL
  Filled 2018-08-25 (×8): qty 1

## 2018-08-25 MED ORDER — DILTIAZEM HCL ER COATED BEADS 180 MG PO CP24
180.0000 mg | ORAL_CAPSULE | Freq: Every day | ORAL | Status: DC
  Administered 2018-08-26 – 2018-08-31 (×6): 180 mg via ORAL
  Filled 2018-08-25 (×6): qty 1

## 2018-08-25 MED ORDER — ACETAMINOPHEN 325 MG PO TABS: 650.0000 mg | ORAL_TABLET | ORAL | Status: DC | PRN

## 2018-08-25 MED ORDER — CARVEDILOL 12.5 MG PO TABS
12.5000 mg | ORAL_TABLET | Freq: Two times a day (BID) | ORAL | Status: DC
  Administered 2018-08-25 – 2018-08-27 (×4): 12.5 mg via ORAL
  Filled 2018-08-25 (×4): qty 1

## 2018-08-25 MED ORDER — FUROSEMIDE 10 MG/ML IJ SOLN
40.0000 mg | Freq: Two times a day (BID) | INTRAMUSCULAR | Status: DC
  Administered 2018-08-26 (×2): 40 mg via INTRAVENOUS
  Filled 2018-08-25 (×2): qty 4

## 2018-08-25 NOTE — ED Triage Notes (Signed)
Pt arrives POV from home c/o shortness of breath for the "past 4-5 weeks." Pt states she has a hx of CHF, afib, and kidney problems. Pt states she takes a diuretic. Pt a&o x4.

## 2018-08-25 NOTE — ED Notes (Signed)
ED Provider at bedside. 

## 2018-08-25 NOTE — Telephone Encounter (Signed)
Returned call to Katharine Look who would like her # as contact for message. Chart updated. Made aware labs have been faxed to Dr. Hollie Salk. Nothing further needed

## 2018-08-25 NOTE — H&P (Signed)
History and Physical    Miranda Garcia DJM:[id number] DOB: June 30, 1930 DOA: 08/25/2018  PCP: Glenda Chroman, MD   Patient coming from: Home   Chief Complaint: SOB, orthopnea   HPI: Miranda Garcia is a 85 y.o. female with medical history significant for atrial fibrillation on Eliquis, insulin-dependent diabetes mellitus, chronic diastolic CHF, and renal insufficiency followed by nephrology, now presenting to the emergency department for evaluation of shortness of breath and orthopnea. Patient has had difficulty breathing, particularly at night or with exertion for several weeks now, improved transiently when she had increased her Lasix for a few days a week ago, but then worsened again when she went back to the prior dose.  Over the last several days, patient has become short of breath while at rest.  She denies any chest pain or palpitations, denies fevers or chills, has not been coughing, denies sick contacts.  She has been sitting up to sleep recently, was previously using 6 pillows, but continues to wake up overnight with shortness of breath.  Patient states that her kidney doctor told her to come to the hospital to be admitted.  ED Course: Upon arrival to the ED, patient is found to be afebrile, saturating 89% on room air, tachypneic, and with stable blood pressure and heart rate.  Chest x-ray features small bilateral pleural effusions and mild bibasilar atelectasis.  Chemistry panel is notable for a creatinine of 3.20, up from 2.64 a month ago.  CBC is notable for hemoglobin of 10.8 and platelets 123,000.  Troponin is undetectable and BNP is elevated to 467.  COVID-19 screening test is pending.  Patient was given 40 mg IV Lasix in the ED and the hospitalists were asked to admit.  Review of Systems:  All other systems reviewed and apart from HPI, are negative.  Past Medical History:  Diagnosis Date   Arthritis    Atrial fibrillation (HCC)    CHF (congestive heart failure) ([place])     Diabetes mellitus type II, controlled ([place])    x 15 yrs   Elevated troponin    Hemorrhoids    Hypertension    Renal insufficiency     Past Surgical History:  Procedure Laterality Date   BACK SURGERY  1997   [facility]   CATARACT EXTRACTION W/PHACO  01/11/2011   Procedure: CATARACT EXTRACTION PHACO AND INTRAOCULAR LENS PLACEMENT ([place]);  Surgeon: Tonny Branch;  Location: [facility];  Service: Ophthalmology;  Laterality: Right;  CDE 19.32   CATARACT EXTRACTION W/PHACO  01/21/2011   Procedure: CATARACT EXTRACTION PHACO AND INTRAOCULAR LENS PLACEMENT (I[facility]);  Surgeon: Tonny Branch;  Location: [facility];  Service: Ophthalmology;  Laterality: Left;  CDE: 21.19   COLONOSCOPY N/A 09/12/2013   Procedure: COLONOSCOPY;  Surgeon: Rogene Houston, MD;  Location: [facility] END[facility] SUITE;  Service: Endoscopy;  Laterality: N/A;  [place]   LITHOTRIPSY     [facility]   PARTIAL HYSTERECTOMY       reports that she has never smoked. She has never used smokeless tobacco. She reports that she does not drink alcohol or use drugs.  Allergies  Allergen Reactions   Atorvastatin Other (See Comments)    Leg pains   Contrast Media [Iodinated Diagnostic Agents]     Pt has 1 full functioning kidney   Penicillins Nausea And Vomiting and Rash    Did it involve swelling of the face/tongue/throat, SOB, or low BP? No Did it involve sudden or severe rash/hives, skin  peeling, or any reaction on the inside of your mouth or nose? Yes Did you need to seek medical attention at a hospital or doctor's office? Yes When did it last happen?65 yrs ago If all above answers are "NO", may proceed with cephalosporin use.     Family History  Problem Relation Age of Onset   Colon cancer Brother      Prior to Admission medications   Medication Sig Start Date End Date Taking? Authorizing Provider  apixaban (ELIQUIS) 2.5 MG TABS tablet Take 1 tablet (2.5 mg total) by mouth 2 (two) times daily. 06/09/18  Yes BranchAlphonse Guild, MD  calcium citrate-vitamin D (CITRACAL+D) 315-200 MG-UNIT per tablet Take 1 tablet by mouth daily.    Yes [provider]  carvedilol (COREG) 12.5 MG tablet Take 12.5 mg by mouth 2 (two) times daily with a meal.   Yes [provider]  cholecalciferol (VITAMIN D3) 25 MCG (1000 UT) tablet Take 1,000 Units by mouth daily.   Yes [provider]  diltiazem (CARDIZEM CD) 180 MG 24 hr capsule TAKE ONE (1) CAPSULE EACH DAY Patient taking differently: Take 180 mg by mouth daily.  05/31/18  Yes Branch, Alphonse Guild, MD  furosemide (LASIX) 20 MG tablet Take 1 tablet (20 mg total) by mouth every other day. 08/03/18  Yes Branch, Alphonse Guild, MD  hydrALAZINE (APRESOLINE) 50 MG tablet Take 1 tablet (50 mg total) by mouth 3 (three) times daily. 08/03/18 11/01/18 Yes Branch, Alphonse Guild, MD  Insulin Aspart Prot & Aspart (NOVOLOG MIX 70/30 [place]) Inject 18 Units into the skin 2 (two) times a day.    Yes [provider]  pravastatin (PRAVACHOL) 20 MG tablet Take 1 tablet (20 mg total) by mouth every evening. 07/03/18 10/01/18 Yes Arnoldo Lenis, MD    Physical Exam: Vitals:   08/25/18 1815 08/25/18 1845 08/25/18 1900 08/25/18 1930  BP: (!) 146/56 139/72 140/61 (!) 142/76  Pulse: 86 88 86 87  Resp: 17 (!) 21 (!) 21 18  Temp:      TempSrc:      SpO2: 95% 96% (!) 89% 96%  Weight:      Height:        Constitutional: NAD, calm  Eyes: PERTLA, lids and conjunctivae normal ENMT: Mucous membranes are moist. Posterior pharynx clear of any exudate or lesions.   Neck: normal, supple, no masses, no thyromegaly Respiratory: Dyspneic with speech. No pallor or cyanosis. No accessory muscle use.  Cardiovascular: Rate ~80 and irregularly irregular. Pretibial pitting edema bilaterally. Abdomen: No distension, no tenderness, soft. Bowel sounds active.  Musculoskeletal: no clubbing / cyanosis. No joint deformity upper and lower extremities.   Skin: no significant rashes, lesions,  ulcers. Warm, dry, well-perfused. Neurologic: CN 2-12 grossly intact. Sensation intact. Strength 5/5 in all 4 limbs.  Psychiatric: Alert and oriented to person, place, and situation. Very pleasant, cooperative.     Labs on Admission: I have personally reviewed following labs and imaging studies  CBC: Recent Labs  Lab 08/25/18 1840  WBC 7.3  HGB 10.8*  HCT 34.1*  MCV 95.3  PLT 194*   Basic Metabolic Panel: Recent Labs  Lab 08/23/18 1633 08/25/18 1840  NA 139 141  K 3.5 3.7  CL 106 110  CO2 20 21*  GLUCOSE 181* 182*  BUN 56* 56*  CREATININE 3.18* 3.20*  CALCIUM 8.7 8.9   GFR: Estimated Creatinine Clearance: 14 mL/min (A) (by C-G formula based on SCr of 3.2 mg/dL (H)). Liver Function  Tests: Recent Labs  Lab 08/25/18 1840  AST 18  ALT 13  ALKPHOS 56  BILITOT 1.2  PROT 6.4*  ALBUMIN 3.3*   No results for input(s): LIPASE, AMYLASE in the last 168 hours. No results for input(s): AMMONIA in the last 168 hours. Coagulation Profile: No results for input(s): INR, PROTIME in the last 168 hours. Cardiac Enzymes: Recent Labs  Lab 08/25/18 1840  TROPONINI <0.03   BNP (last 3 results) Recent Labs    05/31/18 1621  PROBNP 466.0*   HbA1C: No results for input(s): HGBA1C in the last 72 hours. CBG: No results for input(s): GLUCAP in the last 168 hours. Lipid Profile: No results for input(s): CHOL, HDL, LDLCALC, TRIG, CHOLHDL, LDLDIRECT in the last 72 hours. Thyroid Function Tests: No results for input(s): TSH, T4TOTAL, FREET4, T3FREE, THYROIDAB in the last 72 hours. Anemia Panel: No results for input(s): VITAMINB12, FOLATE, FERRITIN, TIBC, IRON, RETICCTPCT in the last 72 hours. Urine analysis:    Component Value Date/Time   COLORURINE YELLOW 08/25/2018 2000   APPEARANCEUR CLEAR 08/25/2018 2000   LABSPEC 1.011 08/25/2018 2000   PHURINE 5.0 08/25/2018 2000   GLUCOSEU NEGATIVE 08/25/2018 2000   HGBUR NEGATIVE 08/25/2018 2000   BILIRUBINUR NEGATIVE 08/25/2018  2000   KETONESUR NEGATIVE 08/25/2018 2000   PROTEINUR NEGATIVE 08/25/2018 2000   NITRITE NEGATIVE 08/25/2018 2000   LEUKOCYTESUR SMALL (A) 08/25/2018 2000   Sepsis Labs: @LABRCNTIP (procalcitonin:4,lacticidven:4) ) Recent Results (from the past 240 hour(s))  SARS Coronavirus 2 (CEPHEID - Performed in [place] hospital lab), Hosp Order     Status: None   Collection Time: 08/25/18  6:49 PM  Result Value Ref Range Status   SARS Coronavirus 2 NEGATIVE NEGATIVE Final    Comment: (NOTE) If result is NEGATIVE SARS-CoV-2 target nucleic acids are NOT DETECTED. The SARS-CoV-2 RNA is generally detectable in upper and lower  respiratory specimens during the acute phase of infection. The lowest  concentration of SARS-CoV-2 viral copies this assay can detect is 250  copies / mL. A negative result does not preclude SARS-CoV-2 infection  and should not be used as the sole basis for treatment or other  patient management decisions.  A negative result may occur with  improper specimen collection / handling, submission of specimen other  than nasopharyngeal swab, presence of viral mutation(s) within the  areas targeted by this assay, and inadequate number of viral copies  (<250 copies / mL). A negative result must be combined with clinical  observations, patient history, and epidemiological information. If result is POSITIVE SARS-CoV-2 target nucleic acids are DETECTED. The SARS-CoV-2 RNA is generally detectable in upper and lower  respiratory specimens dur ing the acute phase of infection.  Positive  results are indicative of active infection with SARS-CoV-2.  Clinical  correlation with patient history and other diagnostic information is  necessary to determine patient infection status.  Positive results do  not rule out bacterial infection or co-infection with other viruses. If result is PRESUMPTIVE POSTIVE SARS-CoV-2 nucleic acids MAY BE PRESENT.   A presumptive positive result was obtained  on the submitted specimen  and confirmed on repeat testing.  While 2019 novel coronavirus  (SARS-CoV-2) nucleic acids may be present in the submitted sample  additional confirmatory testing may be necessary for epidemiological  and / or clinical management purposes  to differentiate between  SARS-CoV-2 and other Sarbecovirus currently known to infect humans.  If clinically indicated additional testing with an alternate test  methodology [phone]) is advised. The SARS-CoV-2  RNA is generally  detectable in upper and lower respiratory sp ecimens during the acute  phase of infection. The expected result is Negative. Fact Sheet for Patients:  StrictlyIdeas.no Fact Sheet for Healthcare Providers: BankingDealers.co.za This test is not yet approved or cleared by the [place] FDA and has been authorized for detection and/or diagnosis of SARS-CoV-2 by FDA under an Emergency Use Authorization (EUA).  This EUA will remain in effect (meaning this test can be used) for the duration of the COVID-19 declaration under Section 564(b)(1) of the Act, 21 U.S.C. section 360bbb-3(b)(1), unless the authorization is terminated or revoked sooner. Performed at [facility], [place] [address], [place], [place] [address]      Radiological Exams on Admission: Dg Chest Port 1 View  Result Date: 08/25/2018 CLINICAL DATA:  Shortness of breath.  History of CHF. EXAM: PORTABLE CHEST 1 VIEW COMPARISON:  Chest x-ray dated August 23, 2018. FINDINGS: Stable mild cardiomegaly. Normal mediastinal contours. Atherosclerotic calcification of the aortic arch. Normal pulmonary vascularity. Unchanged small bilateral pleural effusions and mild bibasilar atelectasis. No pneumothorax. No acute osseous abnormality. IMPRESSION: 1. Unchanged small bilateral pleural effusions and mild bibasilar atelectasis. Electronically Signed   By: Titus Dubin M.D.   On: 08/25/2018 18:23    EKG:  Independently reviewed. EKG ordered, not yet performed, pt in rate-controlled a fib on monitor.   Assessment/Plan   1. Acute on chronic diastolic CHF  - Presents with progressive SOB, orthopnea, and leg swelling, and is found to have elevated BNP, peripheral edema, elevated JVP  - Echo from February with EF 60-65%, moderate AS, and severe LAE  - She was given Lasix 40 mg IV in ED  - Continue diuresis with Lasix 40 mg IV q12h, continue Coreg, follow daily wt and I/O's, monitor renal function and electrolytes    2. CKD stage IV  - SCr is 3.20 on admission; baseline not entirely clear, was 3.18 two days ago, 2.64 one month ago, and 1.6 in March  - She is followed by nephrology, has been off of her ARB, had renal US a wk ago without hydro but notable for cysts and cortical thinning, and she reportedly had negative serologies  - Renally-dose medications, follow closely during diuresis   3. Atrial fibrillation  - In rate-controlled atrial fibrillation on admission  - CHADS-VASc is at least 5 (age x2, CHF, DM, gender)  - Continue Eliquis, continue diltiazem as tolerated   4. Insulin-dependent DM  - No A1c on file  - Continue insulin, start with dose reduction to avoid hypoglycemia in setting of worsening renal function   5. Anemia  - Hgb 10.8 on admission, down from 12.1 a month ago but previously stable in 11-range   - She denies melena or hematochezia, possible this is dilutional in setting of acute CHF, will repeat CBC in am     PPE: Mask, face shield. Patient wearing mask.  DVT prophylaxis: Eliquis   Code Status: Full  Family Communication: Daughter Katharine Look, [phone]) updated by phone  Consults called: None Admission status: Observation     Vianne Bulls, MD [facility] Pager [phone]  If 7PM-7AM, please contact night-coverage [email or web address] Password [email or web address]  08/25/2018, 8:25 PM

## 2018-08-25 NOTE — Telephone Encounter (Signed)
Daughter informed and verbalized understanding

## 2018-08-25 NOTE — Telephone Encounter (Signed)
We don't typically arrange home O2, it has always been done by pulmonary or pcp's. If pulmonary not willing I would have her touch base with Dr Woody Seller' office.

## 2018-08-25 NOTE — Telephone Encounter (Signed)
Daughter is requesting order for oxygen to use at bedtime. Per daughter, patient has problems breathing when she lays down. Per daughter, pulmonologist suggest patient contact our office since the SOB was caused by CHF and her kidney problem.

## 2018-08-25 NOTE — ED Provider Notes (Signed)
[facility] EMERGENCY DEPARTMENT Provider Note   CSN: [id number] Arrival date & time: 08/25/18  1718    History   Chief Complaint Chief Complaint  Patient presents with  . Shortness of Breath    HPI Miranda Garcia is a 85 y.o. female.     Patient c/o sob for the past 4-5 months, getting progressively worse. States symptoms gradual onset, moderate, persistent, slowly/steadily worse. No abrupt change today, but talked to her kidney doctor who indicates to go to ED. Patient indicates compliant w home meds, denies any change in meds or doses. Normal urine output. Initially denies chest pain, but then states chest will feel pressure in chest intermittently, lasting minutes to hours, generally at rest. Denies cough or uri symptoms. No known covid+ exposure. No abd pain or nvd. No recent wt change. +increased bil foot/ankle edema. +orthopnea.   The history is provided by the patient.  Shortness of Breath  Associated symptoms: no abdominal pain, no cough, no fever, no headaches, no neck pain, no rash, no sore throat and no vomiting     Past Medical History:  Diagnosis Date  . Arthritis   . Atrial fibrillation ([place])   . CHF (congestive heart failure) ([place])   . Diabetes mellitus type II, controlled ([place])    x 15 yrs  . Elevated troponin   . Hemorrhoids   . Hypertension   . Renal insufficiency     Patient Active Problem List   Diagnosis Date Noted  . Hemoptysis 06/02/2018  . Dyspnea 04/17/2018  . Pulmonary nodules 04/17/2018  . Heme positive stool 08/23/2013  . Long term (current) use of anticoagulants 03/03/2012  . Chronic diastolic CHF (congestive heart failure) ([place]) 02/28/2012  . Hypomagnesemia 02/26/2012  . Elevated troponin 02/26/2012  . Congestive heart failure ([place]) 02/26/2012  . Atrial fibrillation ([place]) 02/25/2012  . Chest pain 02/25/2012  . CKD (chronic kidney disease) stage 3, GFR 30-59 ml/min (HCC) 02/25/2012  . Leukocytosis 02/25/2012  .  Anemia due to chronic illness 02/25/2012  . Diabetes ([place]) 02/25/2012    Past Surgical History:  Procedure Laterality Date  . [place]   [facility]  . CATARACT EXTRACTION W/PHACO  01/11/2011   Procedure: CATARACT EXTRACTION PHACO AND INTRAOCULAR LENS PLACEMENT (IOC);  Surgeon: Tonny Branch;  Location: [facility] ORS;  Service: Ophthalmology;  Laterality: Right;  CDE 19.32  . CATARACT EXTRACTION W/PHACO  01/21/2011   Procedure: CATARACT EXTRACTION PHACO AND INTRAOCULAR LENS PLACEMENT (IOC);  Surgeon: Tonny Branch;  Location: [facility] ORS;  Service: Ophthalmology;  Laterality: Left;  CDE: 21.19  . COLONOSCOPY N/A 09/12/2013   Procedure: COLONOSCOPY;  Surgeon: Rogene Houston, MD;  Location: [facility] ENDO SUITE;  Service: Endoscopy;  Laterality: N/A;  125  . CYSTOSCOPY     [name]  . LITHOTRIPSY     [facility]  . PARTIAL HYSTERECTOMY       OB History   No obstetric history on file.      Home Medications    Prior to Admission medications   Medication Sig Start Date End Date Taking? Authorizing Provider  apixaban (ELIQUIS) 2.5 MG TABS tablet Take 1 tablet (2.5 mg total) by mouth 2 (two) times daily. 06/09/18   Arnoldo Lenis, MD  calcium citrate-vitamin D (CITRACAL+D) 315-200 MG-UNIT per tablet Take 1 tablet by mouth daily.     [provider]  carvedilol (COREG) 12.5 MG tablet Take 12.5 mg by mouth 2 (two) times daily with a meal.  [provider]  diltiazem (CARDIZEM CD) 180 MG 24 hr capsule TAKE ONE (1) CAPSULE EACH DAY 05/31/18   Arnoldo Lenis, MD  furosemide (LASIX) 20 MG tablet Take 1 tablet (20 mg total) by mouth every other day. 08/03/18   Arnoldo Lenis, MD  hydrALAZINE (APRESOLINE) 50 MG tablet Take 1 tablet (50 mg total) by mouth 3 (three) times daily. 08/03/18 11/01/18  Arnoldo Lenis, MD  Insulin Aspart Prot & Aspart (NOVOLOG MIX 70/30 [place]) Inject into the skin. 18 units BID per daughter    [provider]  pravastatin (PRAVACHOL) 20 MG tablet Take 1  tablet (20 mg total) by mouth every evening. 07/03/18 10/01/18  Arnoldo Lenis, MD    Family History Family History  Problem Relation Age of Onset  . Colon cancer Brother     Social History Social History   Tobacco Use  . Smoking status: Never Smoker  . Smokeless tobacco: Never Used  Substance Use Topics  . Alcohol use: No    Alcohol/week: 0.0 standard drinks  . Drug use: No     Allergies   Atorvastatin; Contrast media [iodinated diagnostic agents]; and Penicillins   Review of Systems Review of Systems  Constitutional: Negative for chills and fever.  HENT: Negative for sore throat.   Eyes: Negative for redness.  Respiratory: Positive for shortness of breath. Negative for cough.   Cardiovascular: Positive for leg swelling. Negative for palpitations.  Gastrointestinal: Negative for abdominal pain, blood in stool, diarrhea and vomiting.  Endocrine: Negative for polyuria.  Genitourinary: Negative for dysuria and flank pain.  Musculoskeletal: Negative for back pain and neck pain.  Skin: Negative for rash.  Neurological: Negative for headaches.  Hematological: Does not bruise/bleed easily.  Psychiatric/Behavioral: Negative for confusion.     Physical Exam Updated Vital Signs Ht 1.6 m (5\' 3" )   Wt 93.4 kg   BMI 36.49 kg/m   Physical Exam Vitals signs and nursing note reviewed.  Constitutional:      Appearance: Normal appearance. She is well-developed.  HENT:     Head: Atraumatic.     Nose: Nose normal.     Mouth/Throat:     Mouth: Mucous membranes are moist.  Eyes:     General: No scleral icterus.    Conjunctiva/sclera: Conjunctivae normal.  Neck:     Musculoskeletal: Normal range of motion and neck supple. No neck rigidity or muscular tenderness.     Trachea: No tracheal deviation.  Cardiovascular:     Rate and Rhythm: Normal rate and regular rhythm.     Pulses: Normal pulses.     Heart sounds: Normal heart sounds. No murmur. No friction rub. No  gallop.   Pulmonary:     Effort: Pulmonary effort is normal. No respiratory distress.     Breath sounds: Normal breath sounds.  Abdominal:     General: Bowel sounds are normal. There is no distension.     Palpations: Abdomen is soft. There is no mass.     Tenderness: There is no abdominal tenderness. There is no guarding.  Genitourinary:    Comments: No cva tenderness.  Musculoskeletal:        General: No swelling.     Comments: Bilateral lower leg/ankle edema, moderate, symmetric.   Skin:    General: Skin is warm and dry.     Findings: No rash.  Neurological:     Mental Status: She is alert.     Comments: Alert, speech normal.   Psychiatric:  Mood and Affect: Mood normal.      ED Treatments / Results  Labs (all labs ordered are listed, but only abnormal results are displayed) Results for orders placed or performed during the hospital encounter of 08/25/18  CBC  Result Value Ref Range   WBC 7.3 4.0 - 10.5 K/uL   RBC 3.58 (L) 3.87 - 5.11 MIL/uL   Hemoglobin 10.8 (L) 12.0 - 15.0 g/dL   HCT 34.1 (L) 36.0 - 46.0 %   MCV 95.3 80.0 - 100.0 fL   MCH 30.2 26.0 - 34.0 pg   MCHC 31.7 30.0 - 36.0 g/dL   RDW 13.3 11.5 - 15.5 %   Platelets 123 (L) 150 - 400 K/uL   nRBC 0.0 0.0 - 0.2 %  Comprehensive metabolic panel  Result Value Ref Range   Sodium 141 135 - 145 mmol/L   Potassium 3.7 3.5 - 5.1 mmol/L   Chloride 110 98 - 111 mmol/L   CO2 21 (L) 22 - 32 mmol/L   Glucose, Bld 182 (H) 70 - 99 mg/dL   BUN 56 (H) 8 - 23 mg/dL   Creatinine, Ser 3.20 (H) 0.44 - 1.00 mg/dL   Calcium 8.9 8.9 - 10.3 mg/dL   Total Protein 6.4 (L) 6.5 - 8.1 g/dL   Albumin 3.3 (L) 3.5 - 5.0 g/dL   AST 18 15 - 41 U/L   ALT 13 0 - 44 U/L   Alkaline Phosphatase 56 38 - 126 U/L   Total Bilirubin 1.2 0.3 - 1.2 mg/dL   GFR calc non Af Amer 13 (L) >60 mL/min   GFR calc Af Amer 15 (L) >60 mL/min   Anion gap 10 5 - 15  Brain natriuretic peptide  Result Value Ref Range   B Natriuretic Peptide 466.9 (H)  0.0 - 100.0 pg/mL  Troponin I - ONCE - STAT  Result Value Ref Range   Troponin I <0.03 <0.03 ng/mL   Dg Chest 2 View  Result Date: 08/23/2018 CLINICAL DATA:  Acute shortness of breath EXAM: CHEST - 2 VIEW COMPARISON:  07/28/2018 FINDINGS: Cardiac shadow is mildly enlarged but stable. Aortic calcifications are seen. Small bilateral pleural effusions are noted. No focal infiltrate is seen. Degenerative changes of the thoracic spine are noted. IMPRESSION: Small bilateral pleural effusions. No other focal abnormality is noted. Electronically Signed   By: Inez Catalina M.D.   On: 08/23/2018 15:34   US Renal  Result Date: 08/16/2018 CLINICAL DATA:  CKD stage 4 EXAM: RENAL / URINARY TRACT ULTRASOUND COMPLETE COMPARISON:  No recent relevant prior available for comparison. FINDINGS: Right Kidney: Renal measurements: 10.2 x 4.6 x 5.5 cm = volume: 134 mL. There is a complex 7 mm nodule in the upper pole the right kidney. The echogenicity is normal. Left Kidney: Renal measurements: 7.4 x 3.1 x 3.5 cm = volume: 41 mL. The echogenicity is normal. There is some cortical thinning. There is a slightly complex 1 cm nodule Arising from the interpolar region. Bladder: The bladder was not definitely visualized. IMPRESSION: 1. No acute sonographic abnormality detected. There is no hydronephrosis. 2. Slightly complex bilateral renal nodules favored to represent proteinaceous cysts. A 6 month follow-up ultrasound can be performed to confirm stability. 3. Bladder not visualized. 4. Cortical thinning of the left kidney which can be seen in patients with medical renal disease. Electronically Signed   By: Constance Holster M.D.   On: 08/16/2018 20:59   Dg Chest Port 1 View  Result Date: 08/25/2018 CLINICAL DATA:  Shortness of breath.  History of CHF. EXAM: PORTABLE CHEST 1 VIEW COMPARISON:  Chest x-ray dated August 23, 2018. FINDINGS: Stable mild cardiomegaly. Normal mediastinal contours. Atherosclerotic calcification of the aortic  arch. Normal pulmonary vascularity. Unchanged small bilateral pleural effusions and mild bibasilar atelectasis. No pneumothorax. No acute osseous abnormality. IMPRESSION: 1. Unchanged small bilateral pleural effusions and mild bibasilar atelectasis. Electronically Signed   By: Titus Dubin M.D.   On: 08/25/2018 18:23   Dg Chest Port 1 View  Result Date: 07/28/2018 CLINICAL DATA:  85 year old female with shortness of breath. EXAM: PORTABLE CHEST 1 VIEW COMPARISON:  Chest CT dated 05/29/2018 FINDINGS: There is cardiomegaly with vascular congestion. Small bilateral pleural effusions may be present. No focal consolidation or pneumothorax. No acute osseous pathology. IMPRESSION: Cardiomegaly with vascular congestion and probable small bilateral pleural effusions. Electronically Signed   By: Anner Crete M.D.   On: 07/28/2018 20:43    EKG None  Radiology Dg Chest [facility] 1 View  Result Date: 08/25/2018 CLINICAL DATA:  Shortness of breath.  History of CHF. EXAM: PORTABLE CHEST 1 VIEW COMPARISON:  Chest x-ray dated August 23, 2018. FINDINGS: Stable mild cardiomegaly. Normal mediastinal contours. Atherosclerotic calcification of the aortic arch. Normal pulmonary vascularity. Unchanged small bilateral pleural effusions and mild bibasilar atelectasis. No pneumothorax. No acute osseous abnormality. IMPRESSION: 1. Unchanged small bilateral pleural effusions and mild bibasilar atelectasis. Electronically Signed   By: Titus Dubin M.D.   On: 08/25/2018 18:23    Procedures Procedures (including critical care time)  Medications Ordered in ED Medications  0.9 %  sodium chloride infusion (has no administration in time range)     Initial Impression / Assessment and Plan / ED Course  I have reviewed the triage vital signs and the nursing notes.  Pertinent labs & imaging results that were available during my care of the patient were reviewed by me and considered in my medical decision making (see chart for  details).  Iv ns. Labs. Cxr. Ecg.   Reviewed nursing notes and prior charts for additional history.   Labs reviewed by me- mild worsening of CRI, mild AKI. BNP is elevated.   CXR reviewed by me - vasc congestion, small eff.  Lasix iv.   Given recent chest pressure, increased orthopnea, leg edema - will plan for admission.   Discussed pt with hospitalist - will admit.     Final Clinical Impressions(s) / ED Diagnoses   Final diagnoses:  None    ED Discharge Orders    None       Lajean Saver, MD 08/25/18 2022

## 2018-08-25 NOTE — ED Notes (Signed)
Pt was pushed to restroom via wheelchair, upon ambulating from toilet to sink pt became very short of breath and weak and had to sit down. SpO2 read 96%.

## 2018-08-25 NOTE — Plan of Care (Signed)
  Problem: Education: Goal: Knowledge of General Education information will improve Description Including pain rating scale, medication(s)/side effects and non-pharmacologic comfort measures Outcome: Progressing   Problem: Health Behavior/Discharge Planning: Goal: Ability to manage health-related needs will improve Outcome: Progressing   Problem: Clinical Measurements: Goal: Ability to maintain clinical measurements within normal limits will improve Outcome: Progressing Goal: Will remain free from infection Outcome: Progressing Goal: Diagnostic test results will improve Outcome: Progressing Goal: Cardiovascular complication will be avoided Outcome: Progressing   Problem: Activity: Goal: Risk for activity intolerance will decrease Outcome: Progressing   Problem: Nutrition: Goal: Adequate nutrition will be maintained Outcome: Progressing   Problem: Coping: Goal: Level of anxiety will decrease Outcome: Progressing   Problem: Elimination: Goal: Will not experience complications related to bowel motility Outcome: Progressing Goal: Will not experience complications related to urinary retention Outcome: Progressing   Problem: Pain Managment: Goal: General experience of comfort will improve Outcome: Progressing   Problem: Safety: Goal: Ability to remain free from injury will improve Outcome: Progressing   Problem: Skin Integrity: Goal: Risk for impaired skin integrity will decrease Outcome: Progressing   Problem: Education: Goal: Ability to demonstrate management of disease process will improve Outcome: Progressing Goal: Ability to verbalize understanding of medication therapies will improve Outcome: Progressing   Problem: Cardiac: Goal: Ability to achieve and maintain adequate cardiopulmonary perfusion will improve Outcome: Progressing

## 2018-08-25 NOTE — ED Notes (Signed)
pts daughters number is sandra [phone]

## 2018-08-25 NOTE — ED Notes (Signed)
C/o being sob

## 2018-08-25 NOTE — ED Notes (Signed)
Report given to rn on 3e 

## 2018-08-25 NOTE — Telephone Encounter (Signed)
Please call pt's daughter on her phone call cell phone [phone] if we can't get her on her work phone

## 2018-08-25 NOTE — Telephone Encounter (Signed)
Cant breath - asking for oxygen due to CHF

## 2018-08-26 DIAGNOSIS — Z6836 Body mass index (BMI) 36.0-36.9, adult: Secondary | ICD-10-CM | POA: Diagnosis not present

## 2018-08-26 DIAGNOSIS — R8271 Bacteriuria: Secondary | ICD-10-CM | POA: Diagnosis not present

## 2018-08-26 DIAGNOSIS — E611 Iron deficiency: Secondary | ICD-10-CM | POA: Diagnosis present

## 2018-08-26 DIAGNOSIS — E785 Hyperlipidemia, unspecified: Secondary | ICD-10-CM | POA: Diagnosis present

## 2018-08-26 DIAGNOSIS — I5033 Acute on chronic diastolic (congestive) heart failure: Secondary | ICD-10-CM | POA: Diagnosis not present

## 2018-08-26 DIAGNOSIS — Z880 Allergy status to penicillin: Secondary | ICD-10-CM | POA: Diagnosis not present

## 2018-08-26 DIAGNOSIS — N179 Acute kidney failure, unspecified: Secondary | ICD-10-CM | POA: Diagnosis not present

## 2018-08-26 DIAGNOSIS — E661 Drug-induced obesity: Secondary | ICD-10-CM

## 2018-08-26 DIAGNOSIS — Z888 Allergy status to other drugs, medicaments and biological substances status: Secondary | ICD-10-CM | POA: Diagnosis not present

## 2018-08-26 DIAGNOSIS — R0609 Other forms of dyspnea: Secondary | ICD-10-CM | POA: Diagnosis present

## 2018-08-26 DIAGNOSIS — E876 Hypokalemia: Secondary | ICD-10-CM | POA: Diagnosis not present

## 2018-08-26 DIAGNOSIS — N281 Cyst of kidney, acquired: Secondary | ICD-10-CM | POA: Diagnosis not present

## 2018-08-26 DIAGNOSIS — I129 Hypertensive chronic kidney disease with stage 1 through stage 4 chronic kidney disease, or unspecified chronic kidney disease: Secondary | ICD-10-CM | POA: Diagnosis not present

## 2018-08-26 DIAGNOSIS — I130 Hypertensive heart and chronic kidney disease with heart failure and stage 1 through stage 4 chronic kidney disease, or unspecified chronic kidney disease: Secondary | ICD-10-CM | POA: Diagnosis not present

## 2018-08-26 DIAGNOSIS — D631 Anemia in chronic kidney disease: Secondary | ICD-10-CM | POA: Diagnosis not present

## 2018-08-26 DIAGNOSIS — I482 Chronic atrial fibrillation, unspecified: Secondary | ICD-10-CM | POA: Diagnosis not present

## 2018-08-26 DIAGNOSIS — Z20828 Contact with and (suspected) exposure to other viral communicable diseases: Secondary | ICD-10-CM | POA: Diagnosis not present

## 2018-08-26 DIAGNOSIS — E119 Type 2 diabetes mellitus without complications: Secondary | ICD-10-CM | POA: Diagnosis not present

## 2018-08-26 DIAGNOSIS — R918 Other nonspecific abnormal finding of lung field: Secondary | ICD-10-CM | POA: Diagnosis not present

## 2018-08-26 DIAGNOSIS — Q600 Renal agenesis, unilateral: Secondary | ICD-10-CM | POA: Diagnosis not present

## 2018-08-26 DIAGNOSIS — Z91041 Radiographic dye allergy status: Secondary | ICD-10-CM | POA: Diagnosis not present

## 2018-08-26 DIAGNOSIS — Z90710 Acquired absence of both cervix and uterus: Secondary | ICD-10-CM | POA: Diagnosis not present

## 2018-08-26 DIAGNOSIS — Z79899 Other long term (current) drug therapy: Secondary | ICD-10-CM | POA: Diagnosis not present

## 2018-08-26 DIAGNOSIS — D638 Anemia in other chronic diseases classified elsewhere: Secondary | ICD-10-CM | POA: Diagnosis not present

## 2018-08-26 DIAGNOSIS — E8770 Fluid overload, unspecified: Secondary | ICD-10-CM | POA: Diagnosis not present

## 2018-08-26 DIAGNOSIS — E1122 Type 2 diabetes mellitus with diabetic chronic kidney disease: Secondary | ICD-10-CM | POA: Diagnosis not present

## 2018-08-26 DIAGNOSIS — N184 Chronic kidney disease, stage 4 (severe): Secondary | ICD-10-CM | POA: Diagnosis not present

## 2018-08-26 DIAGNOSIS — Z7901 Long term (current) use of anticoagulants: Secondary | ICD-10-CM | POA: Diagnosis not present

## 2018-08-26 DIAGNOSIS — Z794 Long term (current) use of insulin: Secondary | ICD-10-CM | POA: Diagnosis not present

## 2018-08-26 DIAGNOSIS — E669 Obesity, unspecified: Secondary | ICD-10-CM | POA: Diagnosis present

## 2018-08-26 LAB — GLUCOSE, CAPILLARY
Glucose-Capillary: 119 mg/dL — ABNORMAL HIGH (ref 70–99)
Glucose-Capillary: 208 mg/dL — ABNORMAL HIGH (ref 70–99)
Glucose-Capillary: 158 mg/dL — ABNORMAL HIGH (ref 70–99)
Glucose-Capillary: 143 mg/dL — ABNORMAL HIGH (ref 70–99)

## 2018-08-26 LAB — CBC
HCT: 31.6 % — ABNORMAL LOW (ref 36.0–46.0)
Hemoglobin: 10.1 g/dL — ABNORMAL LOW (ref 12.0–15.0)
MCH: 30.5 pg (ref 26.0–34.0)
MCHC: 32 g/dL (ref 30.0–36.0)
MCV: 95.5 fL (ref 80.0–100.0)
Platelets: 127 10*3/uL — ABNORMAL LOW (ref 150–400)
RBC: 3.31 MIL/uL — ABNORMAL LOW (ref 3.87–5.11)
RDW: 13.6 % (ref 11.5–15.5)
WBC: 5.9 10*3/uL (ref 4.0–10.5)
nRBC: 0 % (ref 0.0–0.2)

## 2018-08-26 LAB — IRON AND TIBC
Iron: 42 ug/dL (ref 28–170)
Saturation Ratios: 13 % (ref 10.4–31.8)
TIBC: 330 ug/dL (ref 250–450)
UIBC: 288 ug/dL

## 2018-08-26 LAB — RETICULOCYTES
Immature Retic Fract: 8.7 % (ref 2.3–15.9)
RBC.: 3.31 MIL/uL — ABNORMAL LOW (ref 3.87–5.11)
Retic Count, Absolute: 65.9 10*3/uL (ref 19.0–186.0)
Retic Ct Pct: 2 % (ref 0.4–3.1)

## 2018-08-26 LAB — BASIC METABOLIC PANEL
Anion gap: 9 (ref 5–15)
BUN: 55 mg/dL — ABNORMAL HIGH (ref 8–23)
CO2: 24 mmol/L (ref 22–32)
Calcium: 8.8 mg/dL — ABNORMAL LOW (ref 8.9–10.3)
Chloride: 111 mmol/L (ref 98–111)
Creatinine, Ser: 3.28 mg/dL — ABNORMAL HIGH (ref 0.44–1.00)
GFR calc Af Amer: 14 mL/min — ABNORMAL LOW (ref >60)
GFR calc non Af Amer: 12 mL/min — ABNORMAL LOW (ref >60)
Glucose, Bld: 149 mg/dL — ABNORMAL HIGH (ref 70–99)
Potassium: 3.6 mmol/L (ref 3.5–5.1)
Sodium: 144 mmol/L (ref 135–145)

## 2018-08-26 LAB — FOLATE: Folate: 23.5 ng/mL (ref >5.9)

## 2018-08-26 LAB — VITAMIN B12: Vitamin B-12: 299 pg/mL (ref 180–914)

## 2018-08-26 LAB — FERRITIN: Ferritin: 33 ng/mL (ref 11–307)

## 2018-08-26 NOTE — Discharge Instructions (Signed)

## 2018-08-26 NOTE — Plan of Care (Signed)

## 2018-08-26 NOTE — Progress Notes (Signed)
PROGRESS NOTE    Miranda Garcia  OIZ:[id number] DOB: [birth year] DOA: 08/25/2018 PCP: Glenda Chroman, MD     Brief Narrative:  85 y.o. female with medical history significant for atrial fibrillation on Eliquis, insulin-dependent diabetes mellitus, chronic diastolic CHF, and renal insufficiency followed by nephrology, now presenting to the emergency department for evaluation of shortness of breath and orthopnea. Patient has had difficulty breathing, particularly at night or with exertion for several weeks now, improved transiently when she had increased her Lasix for a few days a week ago, but then worsened again when she went back to the prior dose.  Over the last several days, patient has become short of breath while at rest.  She denies any chest pain or palpitations, denies fevers or chills, has not been coughing, denies sick contacts.  She has been sitting up to sleep recently, was previously using 6 pillows, but continues to wake up overnight with shortness of breath.  Patient states that her kidney doctor told her to come to the hospital to be admitted for further evaluation and management.   Assessment & Plan: 1-Acute on chronic diastolic heart failure -Patient requiring oxygen supplementation, still complaining of orthopnea and with shortness of breath on minimal exertion. -On physical exam there is signs of fluid overload (bibasilar crackles and increased lower extremity swelling). -Continue IV diuresis -Low-sodium diet, daily weights and strict intake/output -Nephrology service has been consulted and will follow recommendations for adjustment in her discharge diuretic plans -Patient educated about low-sodium diet and importance of daily weights.  2-chronic atrial fibrillation (HCC) -Rate controlled -Continue Cardizem and carvedilol -CHADsVASC score 4-5 -continue eliquis  3-CKD (chronic kidney disease), stage IV (HCC) -with changes in her GFR  suggesting progression of renal  failure. -nephrology consulted -continue low sodium diet and current IV diuresis  4-Anemia due to chronic illness -IV iron and Epogen therapy as per nephrology discretion. -No signs of overt bleeding -continue to follow hemoglobin trend.  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  6-lipidemia -Continue statins  7-class II obesity -Body mass index is 36.83 kg/m. -Low calorie diet, portion control and increase physical activity discussed with patient.  DVT prophylaxis: Chronically on Eliquis. Code Status: Full code Family Communication: None at bedside Disposition Plan: Continue IV diuresis; follow renal function and electrolytes, follow nephrology recommendations.  Still with signs of fluid overload and complaining of orthopnea. Continue low-sodium diet, daily weights and strict intake and output.  Consultants:   Nephrology service.  Procedures:   See below for x-ray reports  Antimicrobials:  Anti-infectives (From admission, onward)   None      Subjective: Still complaining of shortness of breath and orthopnea; mild symptomatic improvement while wearing oxygen supplementation.  No nausea no vomiting no chest pain.  Objective: Vitals:   08/26/18 0546 08/26/18 0618 08/26/18 0933 08/26/18 1137  BP:  138/69 (!) 136/55 139/60  Pulse:  79 74 78  Resp:  16  20  Temp:  98.5 F (36.9 C)  97.9 F (36.6 C)  TempSrc:  Oral  Oral  SpO2:  99%  99%  Weight: 94.3 kg     Height:        Intake/Output Summary (Last 24 hours) at 08/26/2018 1520 Last data filed at 08/26/2018 1352 Gross per 24 hour  Intake 818.2 ml  Output 900 ml  Net -81.8 ml  Filed Weights   08/25/18 1733 08/25/18 2228 08/26/18 0546  Weight: 93.4 kg 94.7 kg 94.3 kg    Examination: General exam: Alert,  awake, oriented x 3, denies chest pain, no nausea, no vomiting.  Patient reports orthopnea and shortness of breath with minimal exertion.  Some symptomatic improvement with oxygen supplementation Respiratory system: No using accessory muscles; fine crackles at the bases, no wheezing. Cardiovascular system:RRR. No murmurs, rubs, gallops.  Mild JVD appreciated on exam. Gastrointestinal system: Abdomen is obese, nondistended, soft and nontender. No organomegaly or masses felt. Normal bowel sounds heard. Central nervous system: Alert and oriented. No focal neurological deficits. Extremities: No cyanosis or clubbing; 1-2+ edema bilaterally appreciated on exam Skin: No rashes, no petechiae Psychiatry: Judgement and insight appear normal. Mood & affect appropriate.    Data Reviewed: I have personally reviewed following labs and imaging studies  CBC: Recent Labs  Lab 08/25/18 1840 08/26/18 0655  WBC 7.3 5.9  HGB 10.8* 10.1*  HCT 34.1* 31.6*  MCV 95.3 95.5  PLT 123* 449*   Basic Metabolic Panel: Recent Labs  Lab 08/23/18 1633 08/25/18 1840 08/26/18 0516  NA 139 141 144  K 3.5 3.7 3.6  CL 106 110 111  CO2 20 21* 24  GLUCOSE 181* 182* 149*  BUN 56* 56* 55*  CREATININE 3.18* 3.20* 3.28*  CALCIUM 8.7 8.9 8.8*   GFR: Estimated Creatinine Clearance: 13.7 mL/min (A) (by C-G formula based on SCr of 3.28 mg/dL (H)).   Liver Function Tests: Recent Labs  Lab 08/25/18 1840  AST 18  ALT 13  ALKPHOS 56  BILITOT 1.2  PROT 6.4*  ALBUMIN 3.3*   Cardiac Enzymes: Recent Labs  Lab 08/25/18 1840  TROPONINI <0.03   BNP (last 3 results) Recent Labs    05/31/18 1621  PROBNP 466.0*   HbA1C: No results for input(s): HGBA1C in the last 72 hours.   CBG: Recent Labs  Lab 08/25/18 2218 08/26/18 0552 08/26/18 1241  GLUCAP 138* 119* 208*   Anemia Panel: Recent Labs    08/26/18 0516 08/26/18 0655  VITAMINB12 299  --   FOLATE 23.5  --   FERRITIN 33  --   TIBC 330  --   IRON  42  --   RETICCTPCT  --  2.0   Urine analysis:    Component Value Date/Time   COLORURINE YELLOW 08/25/2018 2000   APPEARANCEUR CLEAR 08/25/2018 2000   LABSPEC 1.011 08/25/2018 2000   PHURINE 5.0 08/25/2018 2000   GLUCOSEU NEGATIVE 08/25/2018 2000   HGBUR NEGATIVE 08/25/2018 2000   BILIRUBINUR NEGATIVE 08/25/2018 2000   KETONESUR NEGATIVE 08/25/2018 2000   PROTEINUR NEGATIVE 08/25/2018 2000   NITRITE NEGATIVE 08/25/2018 2000   LEUKOCYTESUR SMALL (A) 08/25/2018 2000    Recent Results (from the past 240 hour(s))  SARS Coronavirus 2 (CEPHEID - Performed in [place] hospital lab), Hosp Order     Status: None   Collection Time: 08/25/18  6:49 PM  Result Value Ref Range Status   SARS Coronavirus 2 NEGATIVE NEGATIVE Final    Comment: (NOTE) If result is NEGATIVE SARS-CoV-2 target nucleic acids are NOT DETECTED. The SARS-CoV-2 RNA is generally detectable in upper and lower  respiratory specimens during the acute phase of infection. The lowest  concentration of SARS-CoV-2 viral copies this assay can detect is 250  copies / mL. A negative result does not preclude SARS-CoV-2 infection  and should not be used as the sole basis for treatment or other  patient management decisions.  A negative result may occur with  improper specimen collection / handling, submission of specimen other  than nasopharyngeal swab, presence of viral mutation(s) within the  areas targeted by this assay, and inadequate number of viral copies  (<250 copies / mL). A negative result must be combined with clinical  observations, patient history, and epidemiological information. If result is POSITIVE SARS-CoV-2 target nucleic acids are DETECTED. The SARS-CoV-2 RNA is generally detectable in upper and lower  respiratory specimens dur ing the acute phase of infection.  Positive  results are indicative of active infection with SARS-CoV-2.  Clinical  correlation with patient history and other diagnostic information  is  necessary to determine patient infection status.  Positive results do  not rule out bacterial infection or co-infection with other viruses. If result is PRESUMPTIVE POSTIVE SARS-CoV-2 nucleic acids MAY BE PRESENT.   A presumptive positive result was obtained on the submitted specimen  and confirmed on repeat testing.  While 2019 novel coronavirus  (SARS-CoV-2) nucleic acids may be present in the submitted sample  additional confirmatory testing may be necessary for epidemiological  and / or clinical management purposes  to differentiate between  SARS-CoV-2 and other Sarbecovirus currently known to infect humans.  If clinically indicated additional testing with an alternate test  methodology [phone]) is advised. The SARS-CoV-2 RNA is generally  detectable in upper and lower respiratory sp ecimens during the acute  phase of infection. The expected result is Negative. Fact Sheet for Patients:  StrictlyIdeas.no Fact Sheet for Healthcare Providers: BankingDealers.co.za This test is not yet approved or cleared by the [place] FDA and has been authorized for detection and/or diagnosis of SARS-CoV-2 by FDA under an Emergency Use Authorization (EUA).  This EUA will remain in effect (meaning this test can be used) for the duration of the COVID-19 declaration under Section 564(b)(1) of the Act, 21 U.S.C. section 360bbb-3(b)(1), unless the authorization is terminated or revoked sooner. Performed at [facility], [place] [address], [place], [place] [address]      Radiology Studies: Dg Chest Port 1 View  Result Date: 08/25/2018 CLINICAL DATA:  Shortness of breath.  History of CHF. EXAM: PORTABLE CHEST 1 VIEW COMPARISON:  Chest x-ray dated August 23, 2018. FINDINGS: Stable mild cardiomegaly. Normal mediastinal contours. Atherosclerotic calcification of the aortic arch. Normal pulmonary vascularity. Unchanged small bilateral pleural effusions  and mild bibasilar atelectasis. No pneumothorax. No acute osseous abnormality. IMPRESSION: 1. Unchanged small bilateral pleural effusions and mild bibasilar atelectasis. Electronically Signed   By: Titus Dubin M.D.   On: 08/25/2018 18:23    Scheduled Meds: . apixaban  2.5 mg Oral BID  . carvedilol  12.5 mg Oral BID WC  . diltiazem  180 mg Oral Daily  . furosemide  40 mg Intravenous Q12H  . hydrALAZINE  50 mg Oral TID  . insulin aspart  0-5 Units Subcutaneous QHS  . insulin aspart  0-9 Units Subcutaneous TID WC  . insulin detemir  9 Units Subcutaneous QHS  . pravastatin  20 mg Oral q1800  . sodium chloride flush  3 mL Intravenous Q12H   Continuous Infusions: . sodium chloride       LOS: 0 days    Time spent: 30 minutes    Barton Dubois, MD [facility] Pager [phone]   08/26/2018, 3:20 PM

## 2018-08-27 LAB — GLUCOSE, CAPILLARY
Glucose-Capillary: 113 mg/dL — ABNORMAL HIGH (ref 70–99)
Glucose-Capillary: 154 mg/dL — ABNORMAL HIGH (ref 70–99)
Glucose-Capillary: 180 mg/dL — ABNORMAL HIGH (ref 70–99)
Glucose-Capillary: 201 mg/dL — ABNORMAL HIGH (ref 70–99)

## 2018-08-27 LAB — HEMOGLOBIN A1C
Hgb A1c MFr Bld: 6.2 % — ABNORMAL HIGH (ref 4.8–5.6)
Mean Plasma Glucose: 131.24 mg/dL

## 2018-08-27 LAB — BASIC METABOLIC PANEL
Anion gap: 10 (ref 5–15)
BUN: 60 mg/dL — ABNORMAL HIGH (ref 8–23)
CO2: 24 mmol/L (ref 22–32)
Calcium: 8.4 mg/dL — ABNORMAL LOW (ref 8.9–10.3)
Chloride: 108 mmol/L (ref 98–111)
Creatinine, Ser: 3.68 mg/dL — ABNORMAL HIGH (ref 0.44–1.00)
GFR calc Af Amer: 12 mL/min — ABNORMAL LOW (ref >60)
GFR calc non Af Amer: 11 mL/min — ABNORMAL LOW (ref >60)
Glucose, Bld: 142 mg/dL — ABNORMAL HIGH (ref 70–99)
Potassium: 3.7 mmol/L (ref 3.5–5.1)
Sodium: 142 mmol/L (ref 135–145)

## 2018-08-27 MED ORDER — FUROSEMIDE 10 MG/ML IJ SOLN
40.0000 mg | Freq: Every day | INTRAMUSCULAR | Status: DC
  Administered 2018-08-27: 40 mg via INTRAVENOUS
  Filled 2018-08-27: qty 4

## 2018-08-27 MED ORDER — FUROSEMIDE 10 MG/ML IJ SOLN
80.0000 mg | Freq: Two times a day (BID) | INTRAMUSCULAR | Status: DC
  Administered 2018-08-27 – 2018-08-30 (×6): 80 mg via INTRAVENOUS
  Filled 2018-08-27 (×6): qty 8

## 2018-08-27 MED ORDER — SODIUM CHLORIDE 0.9 % IV SOLN
510.0000 mg | INTRAVENOUS | Status: DC
  Administered 2018-08-27: 510 mg via INTRAVENOUS
  Filled 2018-08-27: qty 17

## 2018-08-27 MED ORDER — POTASSIUM CHLORIDE CRYS ER 20 MEQ PO TBCR
20.0000 meq | EXTENDED_RELEASE_TABLET | Freq: Every day | ORAL | Status: DC
  Administered 2018-08-27 – 2018-08-31 (×5): 20 meq via ORAL
  Filled 2018-08-27 (×4): qty 1

## 2018-08-27 MED ORDER — CARVEDILOL 6.25 MG PO TABS
6.2500 mg | ORAL_TABLET | Freq: Two times a day (BID) | ORAL | Status: DC
  Administered 2018-08-27 – 2018-08-31 (×8): 6.25 mg via ORAL
  Filled 2018-08-27 (×8): qty 1

## 2018-08-27 NOTE — Progress Notes (Signed)
Pt does not want to take morning dose of Lasixs. Pt states one MD told her she needs to take Lasixs every other day but the order has pt taking medication twice a day. Pt would like clarification. Will continue to monitor pt.

## 2018-08-27 NOTE — Progress Notes (Signed)
PROGRESS NOTE    Miranda Garcia  NOB:[id number] DOB: February 04, 1931 DOA: 08/25/2018 PCP: Glenda Chroman, MD     Brief Narrative:  85 y.o. female with medical history significant for atrial fibrillation on Eliquis, insulin-dependent diabetes mellitus, chronic diastolic CHF, and renal insufficiency followed by nephrology, now presenting to the emergency department for evaluation of shortness of breath and orthopnea. Patient has had difficulty breathing, particularly at night or with exertion for several weeks now, improved transiently when she had increased her Lasix for a few days a week ago, but then worsened again when she went back to the prior dose.  Over the last several days, patient has become short of breath while at rest.  She denies any chest pain or palpitations, denies fevers or chills, has not been coughing, denies sick contacts.  She has been sitting up to sleep recently, was previously using 6 pillows, but continues to wake up overnight with shortness of breath.  Patient states that her kidney doctor told her to come to the hospital to be admitted for further evaluation and management.   Assessment & Plan: 1-Acute on chronic diastolic heart failure -Patient requiring oxygen supplementation, still complaining of orthopnea and with shortness of breath on minimal exertion. -On physical exam there is signs of fluid overload (bibasilar crackles and complaints of orthopnea). -Continue IV diuresis, but lasix dose adjusted as Cr level trending up. -Low-sodium diet, daily weights and strict intake/output -Nephrology service has been consulted and will follow recommendations for adjustment in her discharge diuretic plans -Patient educated about low-sodium diet and importance of daily weights.  2-chronic atrial fibrillation (HCC) -Rate controlled -Continue Cardizem and carvedilol -CHADsVASC score 4-5 -continue eliquis  3-CKD (chronic kidney disease), stage IV (HCC) -with changes in her  GFR  suggesting progression of renal failure. -nephrology consulted -continue low sodium diet and current IV diuresis -Cr 3.6 currently.  4-Anemia due to chronic illness -IV iron and Epogen therapy as per nephrology discretion. -No signs of overt bleeding -continue to follow hemoglobin trend.  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  6-lipidemia -Continue statins  7-class II obesity -Body mass index is 36.44 kg/m. -Low calorie diet, portion control and increase physical activity discussed with patient.  DVT prophylaxis: Chronically on Eliquis. Code Status: Full code Family Communication: None at bedside Disposition Plan: Continue IV diuresis, but eill adjsut lasix dose given increase in her Cr; follow renal function and electrolytes closely, follow nephrology recommendations.  Still with signs of fluid overload and complaining of orthopnea. Continue low-sodium diet, daily weights and strict intake and output.  Consultants:   Nephrology service.  Procedures:   See below for x-ray reports  Antimicrobials:  Anti-infectives (From admission, onward)   None      Subjective: Patient reports improvement in her urine output and denies CP. Continue using 2L [place] supplementation and reports orthopnea and SOB with exertion.    Objective: Vitals:   08/27/18 0736 08/27/18 0937 08/27/18 0943 08/27/18 1151  BP: (!) 143/60 (!) 117/53  (!) 132/55  Pulse: 77 (!) 51 78 81  Resp: 17 18  16   Temp: 98.2 F (36.8 C)   98.1 F (36.7 C)  TempSrc: Oral   Oral  SpO2: 98% 95%  98%  Weight:      Height:        Intake/Output Summary (Last 24  hours) at 08/27/2018 1345 Last data filed at 08/27/2018 1212 Gross per 24 hour  Intake 1009.55 ml  Output 1101 ml  Net -91.45 ml   Filed  Weights   08/25/18 2228 08/26/18 0546 08/27/18 0411  Weight: 94.7 kg 94.3 kg 93.3 kg    Examination: General exam: Alert, awake, oriented x 3, afebrile, no CP, no nausea, no vomiting. Reports improvement tin her urine output; still SOB, with complaints of orthopnea and requiring oxygen supplementation 2L Respiratory system: fine crackles at the bases, no wheezing, no using accessory muscles.  Cardiovascular system:RRR. No murmurs, rubs, gallops. Mild JVD appreciated on exam. Gastrointestinal system: Abdomen is nondistended, soft and nontender. No organomegaly or masses felt. Normal bowel sounds heard. Central nervous system: Alert and oriented. No focal neurological deficits. Extremities: No Cyanosis or clubbing, positive 1+ edema bilaterally  +pedal pulses Skin: No rashes, lesions or ulcers Psychiatry: Judgement and insight appear normal. Mood & affect appropriate.   Data Reviewed: I have personally reviewed following labs and imaging studies  CBC: Recent Labs  Lab 08/25/18 1840 08/26/18 0655  WBC 7.3 5.9  HGB 10.8* 10.1*  HCT 34.1* 31.6*  MCV 95.3 95.5  PLT 123* 856*   Basic Metabolic Panel: Recent Labs  Lab 08/23/18 1633 08/25/18 1840 08/26/18 0516 08/27/18 0538  NA 139 141 144 142  K 3.5 3.7 3.6 3.7  CL 106 110 111 108  CO2 20 21* 24 24  GLUCOSE 181* 182* 149* 142*  BUN 56* 56* 55* 60*  CREATININE 3.18* 3.20* 3.28* 3.68*  CALCIUM 8.7 8.9 8.8* 8.4*   GFR: Estimated Creatinine Clearance: 12.1 mL/min (A) (by C-G formula based on SCr of 3.68 mg/dL (H)).   Liver Function Tests: Recent Labs  Lab 08/25/18 1840  AST 18  ALT 13  ALKPHOS 56  BILITOT 1.2  PROT 6.4*  ALBUMIN 3.3*   Cardiac Enzymes: Recent Labs  Lab 08/25/18 1840  TROPONINI <0.03   BNP (last 3 results) Recent Labs    05/31/18 1621  PROBNP 466.0*   HbA1C: No results for input(s): HGBA1C in the last 72 hours.   CBG: Recent Labs  Lab 08/26/18 1241 08/26/18 1639 08/26/18 2113 08/27/18  0625 08/27/18 1119  GLUCAP 208* 158* 143* 113* 154*   Anemia Panel: Recent Labs    08/26/18 0516 08/26/18 0655  VITAMINB12 299  --   FOLATE 23.5  --   FERRITIN 33  --   TIBC 330  --   IRON 42  --   RETICCTPCT  --  2.0   Urine analysis:    Component Value Date/Time   COLORURINE YELLOW 08/25/2018 2000   APPEARANCEUR CLEAR 08/25/2018 2000   LABSPEC 1.011 08/25/2018 2000   PHURINE 5.0 08/25/2018 2000   GLUCOSEU NEGATIVE 08/25/2018 2000   HGBUR NEGATIVE 08/25/2018 2000   BILIRUBINUR NEGATIVE 08/25/2018 2000   KETONESUR NEGATIVE 08/25/2018 2000   PROTEINUR NEGATIVE 08/25/2018 2000   NITRITE NEGATIVE 08/25/2018 2000   LEUKOCYTESUR SMALL (A) 08/25/2018 2000    Recent Results (from the past 240 hour(s))  SARS Coronavirus 2 (CEPHEID - Performed in [place] hospital lab), Hosp Order     Status: None   Collection Time: 08/25/18  6:49 PM  Result Value Ref Range Status   SARS Coronavirus 2 NEGATIVE NEGATIVE Final    Comment: (NOTE) If result is NEGATIVE SARS-CoV-2 target nucleic acids are NOT DETECTED. The SARS-CoV-2 RNA is generally detectable in upper and lower  respiratory specimens during the acute phase of infection. The lowest  concentration of SARS-CoV-2 viral copies this assay can detect is 250  copies / mL. A negative result does not preclude SARS-CoV-2 infection  and should not be used as the sole basis for treatment or other  patient management decisions.  A negative result may occur with  improper specimen collection / handling, submission of specimen other  than nasopharyngeal swab, presence of viral mutation(s) within the  areas targeted by this assay, and inadequate number of viral copies  (<250 copies / mL). A negative result must be combined with clinical  observations, patient history, and epidemiological information. If result is POSITIVE SARS-CoV-2 target nucleic acids are DETECTED. The SARS-CoV-2 RNA is generally detectable in upper and lower   respiratory specimens dur ing the acute phase of infection.  Positive  results are indicative of active infection with SARS-CoV-2.  Clinical  correlation with patient history and other diagnostic information is  necessary to determine patient infection status.  Positive results do  not rule out bacterial infection or co-infection with other viruses. If result is PRESUMPTIVE POSTIVE SARS-CoV-2 nucleic acids MAY BE PRESENT.   A presumptive positive result was obtained on the submitted specimen  and confirmed on repeat testing.  While 2019 novel coronavirus  (SARS-CoV-2) nucleic acids may be present in the submitted sample  additional confirmatory testing may be necessary for epidemiological  and / or clinical management purposes  to differentiate between  SARS-CoV-2 and other Sarbecovirus currently known to infect humans.  If clinically indicated additional testing with an alternate test  methodology [phone]) is advised. The SARS-CoV-2 RNA is generally  detectable in upper and lower respiratory sp ecimens during the acute  phase of infection. The expected result is Negative. Fact Sheet for Patients:  StrictlyIdeas.no Fact Sheet for Healthcare Providers: BankingDealers.co.za This test is not yet approved or cleared by the [place] FDA and has been authorized for detection and/or diagnosis of SARS-CoV-2 by FDA under an Emergency Use Authorization (EUA).  This EUA will remain in effect (meaning this test can be used) for the duration of the COVID-19 declaration under Section 564(b)(1) of the Act, 21 U.S.C. section 360bbb-3(b)(1), unless the authorization is terminated or revoked sooner. Performed at [facility], [place] [address], [place], [place] [address]      Radiology Studies: Dg Chest Port 1 View  Result Date: 08/25/2018 CLINICAL DATA:  Shortness of breath.  History of CHF. EXAM: PORTABLE CHEST 1 VIEW COMPARISON:  Chest  x-ray dated August 23, 2018. FINDINGS: Stable mild cardiomegaly. Normal mediastinal contours. Atherosclerotic calcification of the aortic arch. Normal pulmonary vascularity. Unchanged small bilateral pleural effusions and mild bibasilar atelectasis. No pneumothorax. No acute osseous abnormality. IMPRESSION: 1. Unchanged small bilateral pleural effusions and mild bibasilar atelectasis. Electronically Signed   By: Titus Dubin M.D.   On: 08/25/2018 18:23    Scheduled Meds: . apixaban  2.5 mg Oral BID  . carvedilol  12.5 mg Oral BID WC  . diltiazem  180 mg Oral Daily  . furosemide  40 mg Intravenous Daily  . hydrALAZINE  50 mg Oral TID  . insulin aspart  0-5 Units Subcutaneous QHS  . insulin aspart  0-9 Units Subcutaneous TID WC  . insulin detemir  9 Units Subcutaneous QHS  . pravastatin  20 mg Oral q1800  . sodium chloride flush  3 mL Intravenous Q12H   Continuous Infusions: . sodium chloride       LOS: 1 day    Time spent: 30 minutes   Barton Dubois,  MD [facility] Pager [phone]   08/27/2018, 1:45 PM

## 2018-08-27 NOTE — TOC Initial Note (Signed)
Transition of Care [facility]) - Initial/Assessment Note    Patient Details  Name: Miranda Garcia MRN: [id number] Date of Birth: January 08, 1931  [facility] CM/SW Contact:    Carles Collet, RN Phone Number: 08/27/2018, 12:15 PM  Clinical Narrative:               Damaris Schooner w patient at bedside. She states that she lives at home with her daughter and grandson. She states that she does not use oxygen at home, patient may need oxygen for DC, will need qualifying oxygen saturation note. She states that she has a nebulizer but she doesn't use it, was told she didn't need to. She states that she gets her meds filled at [facility] in [place]. She states she weighs herself daily and follow a heart healthy diet but she still has trouble with being able to lie down flat to sleep. Spoke w Dr Dyann Kief who states that she will need hospital bed, and possibly O2. Order placed for bed, as well as DME note. Dr Dyann Kief to co sign. Did not order bed yet as pt may need additional DME and will be here another 2 days per MD.  Patient states her family provides transport to apts.      Expected Discharge Plan: Home/Self Care Barriers to Discharge: Continued Medical Work up   Patient Goals and CMS Choice        Expected Discharge Plan and Services Expected Discharge Plan: Home/Self Care                                              Prior Living Arrangements/Services     Patient language and need for interpreter reviewed:: Yes Do you feel safe going back to the place where you live?: Yes            Criminal Activity/Legal Involvement Pertinent to Current Situation/Hospitalization: No - Comment as needed  Activities of Daily Living Home Assistive Devices/Equipment: CBG Meter, Cane (specify quad or straight), Walker (specify type) ADL Screening (condition at time of admission) Patient's cognitive ability adequate to safely complete daily activities?: Yes Is the patient deaf or have  difficulty hearing?: No Does the patient have difficulty seeing, even when wearing glasses/contacts?: No Does the patient have difficulty concentrating, remembering, or making decisions?: No Patient able to express need for assistance with ADLs?: Yes Does the patient have difficulty dressing or bathing?: Yes Independently performs ADLs?: Yes (appropriate for developmental age) Does the patient have difficulty walking or climbing stairs?: Yes Weakness of Legs: Both Weakness of Arms/Hands: None  Permission Sought/Granted                  Emotional Assessment              Admission diagnosis:  Dyspnea on exertion [R06.09] Thrombocytopenia (HCC) [D69.6] AKI (acute kidney injury) ([place]) [N17.9] Acute on chronic combined systolic and diastolic CHF (congestive heart failure) ([place]) [I50.43] CRI (chronic renal insufficiency), stage 4 (severe) ([place]) [N18.4] Patient Active Problem List   Diagnosis Date Noted  . Acute on chronic diastolic CHF (congestive heart failure) ([place]) 08/25/2018  . Insulin-requiring or dependent type II diabetes mellitus ([place]) 08/25/2018  . Hemoptysis 06/02/2018  . Dyspnea 04/17/2018  . Pulmonary nodules 04/17/2018  . Heme positive stool 08/23/2013  . Long term (current) use of anticoagulants 03/03/2012  . Chronic diastolic CHF (  congestive heart failure) ([place]) 02/28/2012  . Hypomagnesemia 02/26/2012  . Elevated troponin 02/26/2012  . Congestive heart failure ([place]) 02/26/2012  . Atrial fibrillation ([place]) 02/25/2012  . Chest pain 02/25/2012  . CKD (chronic kidney disease), stage IV ([place]) 02/25/2012  . Leukocytosis 02/25/2012  . Anemia due to chronic illness 02/25/2012  . Diabetes ([place]) 02/25/2012   PCP:  Glenda Chroman, MD Pharmacy:   [place], [place] [address] Phone: [phone] Fax: [phone]     Social Determinants of Health (SDOH) Interventions    Readmission Risk  Interventions No flowsheet data found.

## 2018-08-27 NOTE — Care Management (Signed)
    Durable Medical Equipment  (From admission, onward)         Start     Ordered   08/27/18 1012  For home use only DME Hospital bed  Once    Question Answer Comment  Length of Need Lifetime   Patient has (list medical condition): orthopnea, COPD, CHF   The above medical condition requires: Patient requires the ability to reposition frequently   Head must be elevated greater than: 30 degrees   Bed type Semi-electric   Support Surface: Gel Overlay      08/27/18 1012

## 2018-08-27 NOTE — Consult Note (Signed)
[facility] Renal Consultation Note  Requesting MD: Dyann Kief Indication for Consultation: A on CRF-versus progressive CKD  HPI:  Miranda Garcia is a 85 y.o. female with past medical history significant for longstanding diabetes mellitus, atrial fibrillation on Eliquis, possibly diastolic congestive heart failure.  Patient had developed worsening of renal function over the last few months from the high ones now to the threes.  Patient actually saw Dr. Hollie Salk as an outpatient on 08/17/2018.  She had concerns of possible pulmonary renal syndrome but work-up was not remarkable for that.  Patient is known to have a solitary kidney.  When her creatinine started to go up her ARB was discontinued but that did not stop the progression.  Urinalysis in the office did show granular casts.  Since being seen in the office, patient has developed worsening symptoms of volume overload-edema, orthopnea, dyspnea on exertion for which she finally presented to the emergency department evening of 6/5.  Presenting creatinine was 3.2.  It is worsened to 3.68.  Urine output total 1200 yesterday which he is still significantly volume overloaded.  Blood pressures not been low.  She has been continued on her Coreg, hydralazine, Cardizem.  Other than her symptoms of volume overload she is without complaints  Creatinine, Ser  Date/Time Value Ref Range Status  08/27/2018 05:38 AM 3.68 (H) 0.44 - 1.00 mg/dL Final  08/26/2018 05:16 AM 3.28 (H) 0.44 - 1.00 mg/dL Final  08/25/2018 06:40 PM 3.20 (H) 0.44 - 1.00 mg/dL Final  08/23/2018 04:33 PM 3.18 (H) 0.40 - 1.20 mg/dL Final  07/28/2018 07:16 PM 2.64 (H) 0.44 - 1.00 mg/dL Final  05/31/2018 04:21 PM 2.04 (H) 0.40 - 1.20 mg/dL Final  05/29/2018 12:17 PM 1.62 (H) 0.44 - 1.00 mg/dL Final  03/21/2017 12:48 PM 1.70 (H) 0.44 - 1.00 mg/dL Final  02/29/2012 04:27 AM 1.60 (H) 0.50 - 1.10 mg/dL Final  02/28/2012 11:53 AM 1.74 (H) 0.50 - 1.10 mg/dL Final  02/27/2012 05:15 AM  1.83 (H) 0.50 - 1.10 mg/dL Final  02/26/2012 04:49 AM 1.67 (H) 0.50 - 1.10 mg/dL Final  02/25/2012 09:30 PM 1.64 (H) 0.50 - 1.10 mg/dL Final  01/05/2011 01:45 PM 1.88 (H) 0.50 - 1.10 mg/dL Final     PMHx:   Past Medical History:  Diagnosis Date  . Arthritis   . Atrial fibrillation ([place])   . CHF (congestive heart failure) ([place])   . Diabetes mellitus type II, controlled ([place])    x 15 yrs  . Elevated troponin   . Hemorrhoids   . Hypertension   . Renal insufficiency     Past Surgical History:  Procedure Laterality Date  . [place]   [facility]  . CATARACT EXTRACTION W/PHACO  01/11/2011   Procedure: CATARACT EXTRACTION PHACO AND INTRAOCULAR LENS PLACEMENT (IOC);  Surgeon: Tonny Branch;  Location: [facility] ORS;  Service: Ophthalmology;  Laterality: Right;  CDE 19.32  . CATARACT EXTRACTION W/PHACO  01/21/2011   Procedure: CATARACT EXTRACTION PHACO AND INTRAOCULAR LENS PLACEMENT (IOC);  Surgeon: Tonny Branch;  Location: [facility] ORS;  Service: Ophthalmology;  Laterality: Left;  CDE: 21.19  . COLONOSCOPY N/A 09/12/2013   Procedure: COLONOSCOPY;  Surgeon: Rogene Houston, MD;  Location: [facility] ENDO SUITE;  Service: Endoscopy;  Laterality: N/A;  125  . CYSTOSCOPY     [name]  . LITHOTRIPSY     [facility]  . PARTIAL HYSTERECTOMY      Family Hx:  Family History  Problem Relation Age of Onset  . Colon cancer  Brother     Social History:  reports that she has never smoked. She has never used smokeless tobacco. She reports that she does not drink alcohol or use drugs.  Allergies:  Allergies  Allergen Reactions  . Atorvastatin Other (See Comments)    Leg pains  . Contrast Media [Iodinated Diagnostic Agents]     Pt has 1 full functioning kidney  . Penicillins Nausea And Vomiting and Rash    Did it involve swelling of the face/tongue/throat, SOB, or low BP? No Did it involve sudden or severe rash/hives, skin peeling, or any reaction on the inside of your mouth or nose? Yes Did you need to seek  medical attention at a hospital or doctor's office? Yes When did it last happen?65 yrs ago If all above answers are "NO", may proceed with cephalosporin use.     Medications: Prior to Admission medications   Medication Sig Start Date End Date Taking? Authorizing Provider  apixaban (ELIQUIS) 2.5 MG TABS tablet Take 1 tablet (2.5 mg total) by mouth 2 (two) times daily. 06/09/18  Yes BranchAlphonse Guild, MD  calcium citrate-vitamin D (CITRACAL+D) 315-200 MG-UNIT per tablet Take 1 tablet by mouth daily.    Yes [provider]  carvedilol (COREG) 12.5 MG tablet Take 12.5 mg by mouth 2 (two) times daily with a meal.   Yes [provider]  cholecalciferol (VITAMIN D3) 25 MCG (1000 UT) tablet Take 1,000 Units by mouth daily.   Yes [provider]  diltiazem (CARDIZEM CD) 180 MG 24 hr capsule TAKE ONE (1) CAPSULE EACH DAY Patient taking differently: Take 180 mg by mouth daily.  05/31/18  Yes Branch, Alphonse Guild, MD  furosemide (LASIX) 20 MG tablet Take 1 tablet (20 mg total) by mouth every other day. 08/03/18  Yes Branch, Alphonse Guild, MD  hydrALAZINE (APRESOLINE) 50 MG tablet Take 1 tablet (50 mg total) by mouth 3 (three) times daily. 08/03/18 11/01/18 Yes Branch, Alphonse Guild, MD  Insulin Aspart Prot & Aspart (NOVOLOG MIX 70/30 [place]) Inject 18 Units into the skin 2 (two) times a day.    Yes [provider]  pravastatin (PRAVACHOL) 20 MG tablet Take 1 tablet (20 mg total) by mouth every evening. 07/03/18 10/01/18 Yes Branch, Alphonse Guild, MD    I have reviewed the patient's current medications.  Labs:  Results for orders placed or performed during the hospital encounter of 08/25/18 (from the past 48 hour(s))  CBC     Status: Abnormal   Collection Time: 08/25/18  6:40 PM  Result Value Ref Range   WBC 7.3 4.0 - 10.5 K/uL   RBC 3.58 (L) 3.87 - 5.11 MIL/uL   Hemoglobin 10.8 (L) 12.0 - 15.0 g/dL   HCT 34.1 (L) 36.0 - 46.0 %   MCV 95.3 80.0 - 100.0 fL   MCH 30.2 26.0 - 34.0  pg   MCHC 31.7 30.0 - 36.0 g/dL   RDW 13.3 11.5 - 15.5 %   Platelets 123 (L) 150 - 400 K/uL    Comment: REPEATED TO VERIFY   nRBC 0.0 0.0 - 0.2 %    Comment: Performed at [facility], [place] [address], [place], [place] [address]  Comprehensive metabolic panel     Status: Abnormal   Collection Time: 08/25/18  6:40 PM  Result Value Ref Range   Sodium 141 135 - 145 mmol/L   Potassium 3.7 3.5 - 5.1 mmol/L   Chloride 110 98 - 111 mmol/L   CO2 21 (L)  22 - 32 mmol/L   Glucose, Bld 182 (H) 70 - 99 mg/dL   BUN 56 (H) 8 - 23 mg/dL   Creatinine, Ser 3.20 (H) 0.44 - 1.00 mg/dL   Calcium 8.9 8.9 - 10.3 mg/dL   Total Protein 6.4 (L) 6.5 - 8.1 g/dL   Albumin 3.3 (L) 3.5 - 5.0 g/dL   AST 18 15 - 41 U/L   ALT 13 0 - 44 U/L   Alkaline Phosphatase 56 38 - 126 U/L   Total Bilirubin 1.2 0.3 - 1.2 mg/dL   GFR calc non Af Amer 13 (L) >60 mL/min   GFR calc Af Amer 15 (L) >60 mL/min   Anion gap 10 5 - 15    Comment: Performed at [facility] [address] [place], [place] [address]  Brain natriuretic peptide     Status: Abnormal   Collection Time: 08/25/18  6:40 PM  Result Value Ref Range   B Natriuretic Peptide 466.9 (H) 0.0 - 100.0 pg/mL    Comment: Performed at [facility] [address], [place], [place] [address]  Troponin I - ONCE - STAT     Status: None   Collection Time: 08/25/18  6:40 PM  Result Value Ref Range   Troponin I <0.03 <0.03 ng/mL    Comment: Performed at [facility], [place] [address] [place],  [address]  SARS Coronavirus 2 (CEPHEID - Performed in [facility] hospital lab), Hosp Order     Status: None   Collection Time: 08/25/18  6:49 PM  Result Value Ref Range   SARS Coronavirus 2 NEGATIVE NEGATIVE    Comment: (NOTE) If result is NEGATIVE SARS-CoV-2 target nucleic acids are NOT DETECTED. The SARS-CoV-2 RNA is generally detectable in upper and lower  respiratory specimens during the acute phase of infection. The lowest  concentration of  SARS-CoV-2 viral copies this assay can detect is 250  copies / mL. A negative result does not preclude SARS-CoV-2 infection  and should not be used as the sole basis for treatment or other  patient management decisions.  A negative result may occur with  improper specimen collection / handling, submission of specimen other  than nasopharyngeal swab, presence of viral mutation(s) within the  areas targeted by this assay, and inadequate number of viral copies  (<250 copies / mL). A negative result must be combined with clinical  observations, patient history, and epidemiological information. If result is POSITIVE SARS-CoV-2 target nucleic acids are DETECTED. The SARS-CoV-2 RNA is generally detectable in upper and lower  respiratory specimens dur ing the acute phase of infection.  Positive  results are indicative of active infection with SARS-CoV-2.  Clinical  correlation with patient history and other diagnostic information is  necessary to determine patient infection status.  Positive results do  not rule out bacterial infection or co-infection with other viruses. If result is PRESUMPTIVE POSTIVE SARS-CoV-2 nucleic acids MAY BE PRESENT.   A presumptive positive result was obtained on the submitted specimen  and confirmed on repeat testing.  While 2019 novel coronavirus  (SARS-CoV-2) nucleic acids may be present in the submitted sample  additional confirmatory testing may be necessary for epidemiological  and / or clinical management purposes  to differentiate between  SARS-CoV-2 and other Sarbecovirus currently known to infect humans.  If clinically indicated additional testing with an alternate test  methodology [phone]) is advised. The SARS-CoV-2 RNA is generally  detectable in upper and lower respiratory sp ecimens during  the acute  phase of infection. The expected result is Negative. Fact Sheet for Patients:  StrictlyIdeas.no Fact Sheet for Healthcare  Providers: BankingDealers.co.za This test is not yet approved or cleared by the [place] FDA and has been authorized for detection and/or diagnosis of SARS-CoV-2 by FDA under an Emergency Use Authorization (EUA).  This EUA will remain in effect (meaning this test can be used) for the duration of the COVID-19 declaration under Section 564(b)(1) of the Act, 21 U.S.C. section 360bbb-3(b)(1), unless the authorization is terminated or revoked sooner. Performed at [facility], [place] [address], [place], [place] [address]   Urinalysis, Routine w reflex microscopic     Status: Abnormal   Collection Time: 08/25/18  8:00 PM  Result Value Ref Range   Color, Urine YELLOW YELLOW   APPearance CLEAR CLEAR   Specific Gravity, Urine 1.011 1.005 - 1.030   pH 5.0 5.0 - 8.0   Glucose, UA NEGATIVE NEGATIVE mg/dL   Hgb urine dipstick NEGATIVE NEGATIVE   Bilirubin Urine NEGATIVE NEGATIVE   Ketones, ur NEGATIVE NEGATIVE mg/dL   Protein, ur NEGATIVE NEGATIVE mg/dL   Nitrite NEGATIVE NEGATIVE   Leukocytes,Ua SMALL (A) NEGATIVE   RBC / HPF 0-5 0 - 5 RBC/hpf   WBC, UA 11-20 0 - 5 WBC/hpf   Bacteria, UA NONE SEEN NONE SEEN   Squamous Epithelial / LPF 0-5 0 - 5   Mucus PRESENT    Hyaline Casts, UA PRESENT     Comment: Performed at [facility], [place] [address] [place], [place] [address]  Glucose, capillary     Status: Abnormal   Collection Time: 08/25/18 10:18 PM  Result Value Ref Range   Glucose-Capillary 138 (H) 70 - 99 mg/dL  Basic metabolic panel     Status: Abnormal   Collection Time: 08/26/18  5:16 AM  Result Value Ref Range   Sodium 144 135 - 145 mmol/L   Potassium 3.6 3.5 - 5.1 mmol/L   Chloride 111 98 - 111 mmol/L   CO2 24 22 - 32 mmol/L   Glucose, Bld 149 (H) 70 - 99 mg/dL   BUN 55 (H) 8 - 23 mg/dL   Creatinine, Ser 3.28 (H) 0.44 - 1.00 mg/dL   Calcium 8.8 (L) 8.9 - 10.3 mg/dL   GFR calc non Af Amer 12 (L) >60 mL/min   GFR calc Af Amer 14 (L) >60  mL/min   Anion gap 9 5 - 15    Comment: Performed at [facility] [address] [place], [place] [address]  Vitamin B12     Status: None   Collection Time: 08/26/18  5:16 AM  Result Value Ref Range   Vitamin B-12 299 180 - 914 pg/mL    Comment: (NOTE) This assay is not validated for testing neonatal or myeloproliferative syndrome specimens for Vitamin B12 levels. Performed at [facility], [place] [address] [place], [place] [address]   Folate     Status: None   Collection Time: 08/26/18  5:16 AM  Result Value Ref Range   Folate 23.5 >5.9 ng/mL    Comment: Performed at [facility] [address], [place], [place] [address]  Iron and TIBC     Status: None   Collection Time: 08/26/18  5:16 AM  Result Value Ref Range   Iron 42 28 - 170 ug/dL   TIBC 330 250 - 450 ug/dL   Saturation Ratios 13 10.4 - 31.8 %  UIBC 288 ug/dL    Comment: Performed at [facility], [place] [address], [place], [place] [address]  Ferritin     Status: None   Collection Time: 08/26/18  5:16 AM  Result Value Ref Range   Ferritin 33 11 - 307 ng/mL    Comment: Performed at [facility], [place] [address], [place], [place] [address]  Glucose, capillary     Status: Abnormal   Collection Time: 08/26/18  5:52 AM  Result Value Ref Range   Glucose-Capillary 119 (H) 70 - 99 mg/dL  CBC     Status: Abnormal   Collection Time: 08/26/18  6:55 AM  Result Value Ref Range   WBC 5.9 4.0 - 10.5 K/uL   RBC 3.31 (L) 3.87 - 5.11 MIL/uL   Hemoglobin 10.1 (L) 12.0 - 15.0 g/dL   HCT 31.6 (L) 36.0 - 46.0 %   MCV 95.5 80.0 - 100.0 fL   MCH 30.5 26.0 - 34.0 pg   MCHC 32.0 30.0 - 36.0 g/dL   RDW 13.6 11.5 - 15.5 %   Platelets 127 (L) 150 - 400 K/uL   nRBC 0.0 0.0 - 0.2 %    Comment: Performed at [facility], [place] [address], [place], [place] [address]  Reticulocytes     Status: Abnormal   Collection Time: 08/26/18  6:55 AM  Result Value Ref Range   Retic Ct Pct 2.0 0.4 - 3.1 %   RBC. 3.31  (L) 3.87 - 5.11 MIL/uL   Retic Count, Absolute 65.9 19.0 - 186.0 K/uL   Immature Retic Fract 8.7 2.3 - 15.9 %    Comment: Performed at [facility] [address], [place], [place] [address]  Glucose, capillary     Status: Abnormal   Collection Time: 08/26/18 12:41 PM  Result Value Ref Range   Glucose-Capillary 208 (H) 70 - 99 mg/dL  Glucose, capillary     Status: Abnormal   Collection Time: 08/26/18  4:39 PM  Result Value Ref Range   Glucose-Capillary 158 (H) 70 - 99 mg/dL  Glucose, capillary     Status: Abnormal   Collection Time: 08/26/18  9:13 PM  Result Value Ref Range   Glucose-Capillary 143 (H) 70 - 99 mg/dL  Basic metabolic panel     Status: Abnormal   Collection Time: 08/27/18  5:38 AM  Result Value Ref Range   Sodium 142 135 - 145 mmol/L   Potassium 3.7 3.5 - 5.1 mmol/L   Chloride 108 98 - 111 mmol/L   CO2 24 22 - 32 mmol/L   Glucose, Bld 142 (H) 70 - 99 mg/dL   BUN 60 (H) 8 - 23 mg/dL   Creatinine, Ser 3.68 (H) 0.44 - 1.00 mg/dL   Calcium 8.4 (L) 8.9 - 10.3 mg/dL   GFR calc non Af Amer 11 (L) >60 mL/min   GFR calc Af Amer 12 (L) >60 mL/min   Anion gap 10 5 - 15    Comment: Performed at [facility], [place] [address], [place], [place] [address]  Glucose, capillary     Status: Abnormal   Collection Time: 08/27/18  6:25 AM  Result Value Ref Range   Glucose-Capillary 113 (H) 70 - 99 mg/dL  Glucose, capillary     Status: Abnormal   Collection Time: 08/27/18 11:19 AM  Result Value Ref Range   Glucose-Capillary 154 (H) 70 - 99 mg/dL     ROS:  Pertinent items are noted in  HPI.  Physical Exam: Vitals:   08/27/18 0943 08/27/18 1151  BP:  (!) 132/55  Pulse: 78 81  Resp:  16  Temp:  98.1 F (36.7 C)  SpO2:  98%     General: elderly WF-  Seems with some inc WOB HEENT: PERRLA, EOMI Neck: positive for JVD Heart: RRR Lungs: dec BS Abdomen: obese, soft Extremities: pitting edema  Skin: warm and dry Neuro: alert-  Seems non focal    Assessment/Plan: 85 year old WF with DM and some cardiac dysfunction who has had progressive chronic kidney disease in the setting of a solitary kidney over the last several months.  Now presenting with volume overload 1.Renal-acute on chronic renal insufficiency versus just progressive renal insufficiency an 85 year old lady with a solitary kidney.  She has had a significant work-up that appears negative for kidney specific disease.  She did have granular casts which begs a question if her blood pressure has been too low.  Right now, volume overloaded needs high-dose Lasix.  If Lasix is not successful or leads to progressive uremia then we have a more serious conversation about where this could be headed.  Dialysis has been mentioned to the patient and her daughter.  Her daughter feels that she would want to try dialysis if it came down to that.  There are no indications for dialysis at present 2. Hypertension/volume  -volume overload with symptoms.  Will initiate relatively high-dose Lasix at 80 mg IV every 12 hours.  I will discontinue hydralazine and decrease carvedilol so that blood pressure does not get too low 3. Anemia  -not a major issue right now, hemoglobin close to 10.  He is iron deficient, will go ahead and dose with Feraheme 4.  Hypokalemia-anticipated will lower with diuresis, will add on supplement   Louis Meckel 08/27/2018, 2:55 PM

## 2018-08-28 LAB — URINALYSIS, ROUTINE W REFLEX MICROSCOPIC
Bilirubin Urine: NEGATIVE
Glucose, UA: NEGATIVE mg/dL
Hgb urine dipstick: NEGATIVE
Ketones, ur: NEGATIVE mg/dL
Nitrite: NEGATIVE
Protein, ur: NEGATIVE mg/dL
Specific Gravity, Urine: 1.006 (ref 1.005–1.030)
pH: 5 (ref 5.0–8.0)

## 2018-08-28 LAB — BASIC METABOLIC PANEL
Anion gap: 10 (ref 5–15)
BUN: 68 mg/dL — ABNORMAL HIGH (ref 8–23)
CO2: 22 mmol/L (ref 22–32)
Calcium: 8.2 mg/dL — ABNORMAL LOW (ref 8.9–10.3)
Chloride: 106 mmol/L (ref 98–111)
Creatinine, Ser: 4.1 mg/dL — ABNORMAL HIGH (ref 0.44–1.00)
GFR calc Af Amer: 11 mL/min — ABNORMAL LOW (ref >60)
GFR calc non Af Amer: 9 mL/min — ABNORMAL LOW (ref >60)
Glucose, Bld: 153 mg/dL — ABNORMAL HIGH (ref 70–99)
Potassium: 3.9 mmol/L (ref 3.5–5.1)
Sodium: 138 mmol/L (ref 135–145)

## 2018-08-28 LAB — GLUCOSE, CAPILLARY
Glucose-Capillary: 133 mg/dL — ABNORMAL HIGH (ref 70–99)
Glucose-Capillary: 178 mg/dL — ABNORMAL HIGH (ref 70–99)
Glucose-Capillary: 169 mg/dL — ABNORMAL HIGH (ref 70–99)
Glucose-Capillary: 157 mg/dL — ABNORMAL HIGH (ref 70–99)

## 2018-08-28 NOTE — Progress Notes (Signed)
PROGRESS NOTE    Miranda Garcia  ZOX:[id number] DOB: Nov 20, 1930 DOA: 08/25/2018 PCP: Glenda Chroman, MD     Brief Narrative:  85 y.o. female with medical history significant for atrial fibrillation on Eliquis, insulin-dependent diabetes mellitus, chronic diastolic CHF, and renal insufficiency followed by nephrology, now presenting to the emergency department for evaluation of shortness of breath and orthopnea. Patient has had difficulty breathing, particularly at night or with exertion for several weeks now, improved transiently when she had increased her Lasix for a few days a week ago, but then worsened again when she went back to the prior dose.  Over the last several days, patient has become short of breath while at rest.  She denies any chest pain or palpitations, denies fevers or chills, has not been coughing, denies sick contacts.  She has been sitting up to sleep recently, was previously using 6 pillows, but continues to wake up overnight with shortness of breath.  Patient states that her kidney doctor told her to come to the hospital to be admitted for further evaluation and management.   Assessment & Plan: 1-Acute on chronic diastolic heart failure -Patient requiring oxygen supplementation, still complaining of orthopnea and with shortness of breath on minimal exertion. -On physical exam there is signs of fluid overload (bibasilar crackles and complaints of orthopnea). -Continue IV diuresis, but lasix dose adjusted as Cr level trending up. -Low-sodium diet, daily weights and strict intake/output -Nephrology service has been consulted and will follow recommendations  2-chronic atrial fibrillation ([place]) -Rate controlled -Continue Cardizem and carvedilol -CHADsVASC score 4-5 -continue eliquis  3-CKD (chronic kidney disease), stage IV (HCC) AKI likely secondary to progression of chronic kidney disease. -with changes in her GFR  suggesting progression of renal failure. -nephrology  consulted -continue low sodium diet and current IV diuresis -Cr worsening any urine output is also not picking up despite increasing dose of the Lasix.  At present it appears that the patient may end up on IHD.  Appreciate nephrology input.  4-Anemia due to chronic illness -IV iron and Epogen therapy as per nephrology discretion. -No signs of overt bleeding -continue to follow hemoglobin trend.  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  6-lipidemia -Continue statins  7-class II obesity -Body mass index is 36.72 kg/m. -Low calorie diet, portion control and increase physical activity discussed with patient.  8.  Asymptomatic bacteriuria. The patient does not have any evidence of fever or chills.  No complaints of burning urination.  No nausea no vomiting. UA on the time of admission showed WBC present. At present I do not feel that there is any indication for further work-up or treatment.  DVT prophylaxis: Chronically on Eliquis.  May require adjustment depending on renal function  Code Status: Full code Family Communication: None at bedside Disposition Plan: May require HD.  Continue to monitor.  Consultants:   Nephrology service.  Procedures:   See below for x-ray reports  Antimicrobials:  Anti-infectives (From admission, onward)   None      Subjective: Continues to report orthopnea and PND.  Also has some swelling in the leg.  No chest pain abdominal pain.  No nausea no vomiting.  Objective: Vitals:   08/28/18 0018 08/28/18 0032 08/28/18 0853 08/28/18 0853  BP: (!) 134/56  (!) 118/52 (!) 118/52  Pulse: 73  85 83  Resp:  18   16  Temp: 98 F (36.7 C)   98.1 F (36.7 C)  TempSrc: Oral   Oral  SpO2: 98%   98%  Weight:  94 kg    Height:        Intake/Output  Summary (Last 24 hours) at 08/28/2018 1740 Last data filed at 08/28/2018 1200 Gross per 24 hour  Intake 600 ml  Output 800 ml  Net -200 ml   Filed Weights   08/26/18 0546 08/27/18 0411 08/28/18 0032  Weight: 94.3 kg 93.3 kg 94 kg    Examination: General exam: Alert, awake, oriented x 3, afebrile, no CP, no nausea, no vomiting. Reports improvement tin her urine output; still SOB, with complaints of orthopnea and requiring oxygen supplementation 2L Respiratory system: fine crackles at the bases, no wheezing, no using accessory muscles.  Cardiovascular system:RRR. No murmurs, rubs, gallops. Mild JVD appreciated on exam. Gastrointestinal system: Abdomen is nondistended, soft and nontender. No organomegaly or masses felt. Normal bowel sounds heard. Central nervous system: Alert and oriented. No focal neurological deficits. Extremities: No Cyanosis or clubbing, positive 1+ edema bilaterally  +pedal pulses Skin: No rashes, lesions or ulcers Psychiatry: Judgement and insight appear normal. Mood & affect appropriate.   Data Reviewed: I have personally reviewed following labs and imaging studies  CBC: Recent Labs  Lab 08/25/18 1840 08/26/18 0655  WBC 7.3 5.9  HGB 10.8* 10.1*  HCT 34.1* 31.6*  MCV 95.3 95.5  PLT 123* 681*   Basic Metabolic Panel: Recent Labs  Lab 08/23/18 1633 08/25/18 1840 08/26/18 0516 08/27/18 0538 08/28/18 0537  NA 139 141 144 142 138  K 3.5 3.7 3.6 3.7 3.9  CL 106 110 111 108 106  CO2 20 21* 24 24 22   GLUCOSE 181* 182* 149* 142* 153*  BUN 56* 56* 55* 60* 68*  CREATININE 3.18* 3.20* 3.28* 3.68* 4.10*  CALCIUM 8.7 8.9 8.8* 8.4* 8.2*   GFR: Estimated Creatinine Clearance: 10.9 mL/min (A) (by C-G formula based on SCr of 4.1 mg/dL (H)).   Liver Function Tests: Recent Labs  Lab 08/25/18 1840  AST 18  ALT 13  ALKPHOS 56  BILITOT 1.2  PROT 6.4*  ALBUMIN 3.3*   Cardiac Enzymes: Recent Labs  Lab 08/25/18 1840  TROPONINI <0.03   BNP (last 3 results)  Recent Labs    05/31/18 1621  PROBNP 466.0*   HbA1C: Recent Labs    08/26/18 0655  HGBA1C 6.2*     CBG: Recent Labs  Lab 08/27/18 1645 08/27/18 2117 08/28/18 0547 08/28/18 1124 08/28/18 1723  GLUCAP 180* 201* 133* 178* 169*   Anemia Panel: Recent Labs    08/26/18 0516 08/26/18 0655  VITAMINB12 299  --   FOLATE 23.5  --   FERRITIN 33  --   TIBC 330  --   IRON 42  --   RETICCTPCT  --  2.0   Urine analysis:    Component Value Date/Time   COLORURINE YELLOW 08/25/2018 2000   APPEARANCEUR CLEAR 08/25/2018 2000   LABSPEC 1.011 08/25/2018 2000   PHURINE 5.0 08/25/2018 2000   GLUCOSEU NEGATIVE 08/25/2018 2000   HGBUR NEGATIVE 08/25/2018 2000   BILIRUBINUR NEGATIVE 08/25/2018 2000   [place] NEGATIVE 08/25/2018 2000   PROTEINUR NEGATIVE 08/25/2018 2000   NITRITE NEGATIVE 08/25/2018 2000   LEUKOCYTESUR SMALL (A) 08/25/2018 2000    Recent Results (from the past 240 hour(s))  SARS Coronavirus 2 (CEPHEID - Performed in [facility] hospital lab), Hosp Order     Status:  None   Collection Time: 08/25/18  6:49 PM  Result Value Ref Range Status   SARS Coronavirus 2 NEGATIVE NEGATIVE Final    Comment: (NOTE) If result is NEGATIVE SARS-CoV-2 target nucleic acids are NOT DETECTED. The SARS-CoV-2 RNA is generally detectable in upper and lower  respiratory specimens during the acute phase of infection. The lowest  concentration of SARS-CoV-2 viral copies this assay can detect is 250  copies / mL. A negative result does not preclude SARS-CoV-2 infection  and should not be used as the sole basis for treatment or other  patient management decisions.  A negative result may occur with  improper specimen collection / handling, submission of specimen other  than nasopharyngeal swab, presence of viral mutation(s) within the  areas targeted by this assay, and inadequate number of viral copies  (<250 copies / mL). A negative result must be combined with clinical  observations,  patient history, and epidemiological information. If result is POSITIVE SARS-CoV-2 target nucleic acids are DETECTED. The SARS-CoV-2 RNA is generally detectable in upper and lower  respiratory specimens dur ing the acute phase of infection.  Positive  results are indicative of active infection with SARS-CoV-2.  Clinical  correlation with patient history and other diagnostic information is  necessary to determine patient infection status.  Positive results do  not rule out bacterial infection or co-infection with other viruses. If result is PRESUMPTIVE POSTIVE SARS-CoV-2 nucleic acids MAY BE PRESENT.   A presumptive positive result was obtained on the submitted specimen  and confirmed on repeat testing.  While 2019 novel coronavirus  (SARS-CoV-2) nucleic acids may be present in the submitted sample  additional confirmatory testing may be necessary for epidemiological  and / or clinical management purposes  to differentiate between  SARS-CoV-2 and other Sarbecovirus currently known to infect humans.  If clinically indicated additional testing with an alternate test  methodology [phone]) is advised. The SARS-CoV-2 RNA is generally  detectable in upper and lower respiratory sp ecimens during the acute  phase of infection. The expected result is Negative. Fact Sheet for Patients:  StrictlyIdeas.no Fact Sheet for Healthcare Providers: BankingDealers.co.za This test is not yet approved or cleared by the [place] FDA and has been authorized for detection and/or diagnosis of SARS-CoV-2 by FDA under an Emergency Use Authorization (EUA).  This EUA will remain in effect (meaning this test can be used) for the duration of the COVID-19 declaration under Section 564(b)(1) of the Act, 21 U.S.C. section 360bbb-3(b)(1), unless the authorization is terminated or revoked sooner. Performed at [facility], [place] [address] [place], [place]  [address]      Radiology Studies: No results found.  Scheduled Meds: . apixaban  2.5 mg Oral BID  . carvedilol  6.25 mg Oral BID WC  . diltiazem  180 mg Oral Daily  . furosemide  80 mg Intravenous BID  . insulin aspart  0-5 Units Subcutaneous QHS  . insulin aspart  0-9 Units Subcutaneous TID WC  . insulin detemir  9 Units Subcutaneous QHS  . potassium chloride  20 mEq Oral Daily  . pravastatin  20 mg Oral q1800  . sodium chloride flush  3 mL Intravenous Q12H   Continuous Infusions: . sodium chloride    . ferumoxytol Stopped (08/27/18 1640)     LOS: 2 days    Author:  Berle Mull, MD [facility] 08/28/2018   To reach On-call, see care teams to locate the attending and reach out to them via [email or web address].  If 7PM-7AM, please contact night-coverage If you still have difficulty reaching the attending provider, please page the [facility] (Director on Call) for [facility] on amion for assistance.

## 2018-08-28 NOTE — Progress Notes (Signed)
[facility] Progress Note    Assessment/ Plan:   85 y.o. female DM, afib on Eliquis, possibly diastolic congestive heart failure here w/ worsening of renal function over the last few months from the high ones now to the threes.  Patient actually saw Dr. Hollie Salk as an outpatient on 08/17/2018.  She had concerns of possible pulmonary renal syndrome but work-up was not remarkable for that.  Patient is known to have a solitary kidney.  When her creatinine started to go up her ARB was discontinued but that did not stop the progression.  Here w/ worsening volume overload w/ Cr of 3.2 worsened to 3.68.    1.Renal-acute on chronic renal insufficiency versus just progressive renal insufficiency an 85 year old lady with a solitary kidney.  She has had a significant work-up that appears negative for kidney specific disease.  She did have granular casts which begs a question if her blood pressure has been too low.  Right now, volume overloaded needs high-dose Lasix.  If Lasix is not successful or leads to progressive uremia then we have a more serious conversation about where this could be headed.    - Dialysis has been mentioned to the patient and the daughter; I spoke with the daughter today but she would like a video conference call if we decide to move towards dialysis.   There are no absolute indications for dialysis at present but if no improvement tomorrow then we will need the teleconference.  2. Hypertension/volume  -volume overload with symptoms.  Will initiate relatively high-dose Lasix at 80 mg IV every 12 hours.  I will discontinue hydralazine and decrease carvedilol so that blood pressure does not get too low 3. Anemia  -not a major issue right now, hemoglobin close to 10.  She is iron deficient, will go ahead and dose with Feraheme 4.  Hypokalemia-anticipated will lower with diuresis, will add on supplement   Subjective:   Mild dyspnea but no worse. Denies n now but was nauseous at  home. Poor appetite.   Objective:   BP (!) 118/52 (BP Location: Right Arm)   Pulse 83   Temp 98.1 F (36.7 C) (Oral)   Resp 16   Ht 5\' 3"  (1.6 m)   Wt 94 kg   SpO2 98%   BMI 36.72 kg/m   Intake/Output Summary (Last 24 hours) at 08/28/2018 1625 Last data filed at 08/28/2018 1200 Gross per 24 hour  Intake 955 ml  Output 800 ml  Net 155 ml   Weight change: 0.731 kg  Physical Exam: General: elderly WF-  Seems with some inc WOB HEENT: PERRLA, EOMI Neck: positive for JVD Heart: RRR Lungs: dec BS Abdomen: obese, soft Extremities: pitting edema  Skin: warm and dry Neuro: alert-  Seems non focal   Imaging: No results found.  Labs: BMET Recent Labs  Lab 08/23/18 1633 08/25/18 1840 08/26/18 0516 08/27/18 0538 08/28/18 0537  NA 139 141 144 142 138  K 3.5 3.7 3.6 3.7 3.9  CL 106 110 111 108 106  CO2 20 21* 24 24 22   GLUCOSE 181* 182* 149* 142* 153*  BUN 56* 56* 55* 60* 68*  CREATININE 3.18* 3.20* 3.28* 3.68* 4.10*  CALCIUM 8.7 8.9 8.8* 8.4* 8.2*   CBC Recent Labs  Lab 08/25/18 1840 08/26/18 0655  WBC 7.3 5.9  HGB 10.8* 10.1*  HCT 34.1* 31.6*  MCV 95.3 95.5  PLT 123* 127*    Medications:    . apixaban  2.5 mg Oral BID  .  carvedilol  6.25 mg Oral BID WC  . diltiazem  180 mg Oral Daily  . furosemide  80 mg Intravenous BID  . insulin aspart  0-5 Units Subcutaneous QHS  . insulin aspart  0-9 Units Subcutaneous TID WC  . insulin detemir  9 Units Subcutaneous QHS  . potassium chloride  20 mEq Oral Daily  . pravastatin  20 mg Oral q1800  . sodium chloride flush  3 mL Intravenous Q12H      Otelia Santee, MD 08/28/2018, 4:25 PM

## 2018-08-28 NOTE — Progress Notes (Signed)
Orthopedic Tech Progress Note Patient Details:  Miranda Garcia [birth year] [id number]  Ortho Devices Type of Ortho Device: Haematologist Ortho Device/Splint Interventions: Application, Ordered   Post Interventions Patient Tolerated: Well Instructions Provided: Care of device, Adjustment of device, Poper ambulation with device   Melony Overly T 08/28/2018, 2:52 PM

## 2018-08-29 LAB — BASIC METABOLIC PANEL
Anion gap: 11 (ref 5–15)
BUN: 77 mg/dL — ABNORMAL HIGH (ref 8–23)
CO2: 22 mmol/L (ref 22–32)
Calcium: 8 mg/dL — ABNORMAL LOW (ref 8.9–10.3)
Chloride: 105 mmol/L (ref 98–111)
Creatinine, Ser: 4.29 mg/dL — ABNORMAL HIGH (ref 0.44–1.00)
GFR calc Af Amer: 10 mL/min — ABNORMAL LOW (ref >60)
GFR calc non Af Amer: 9 mL/min — ABNORMAL LOW (ref >60)
Glucose, Bld: 154 mg/dL — ABNORMAL HIGH (ref 70–99)
Potassium: 4.1 mmol/L (ref 3.5–5.1)
Sodium: 138 mmol/L (ref 135–145)

## 2018-08-29 LAB — GLUCOSE, CAPILLARY
Glucose-Capillary: 129 mg/dL — ABNORMAL HIGH (ref 70–99)
Glucose-Capillary: 172 mg/dL — ABNORMAL HIGH (ref 70–99)
Glucose-Capillary: 178 mg/dL — ABNORMAL HIGH (ref 70–99)
Glucose-Capillary: 177 mg/dL — ABNORMAL HIGH (ref 70–99)

## 2018-08-29 MED ORDER — HEPARIN (PORCINE) 25000 UT/250ML-% IV SOLN
1100.0000 [IU]/h | INTRAVENOUS | Status: DC
  Administered 2018-08-29: 1100 [IU]/h via INTRAVENOUS
  Filled 2018-08-29: qty 250

## 2018-08-29 NOTE — Progress Notes (Signed)
[facility] notified of increasing Cr level to 4.29

## 2018-08-29 NOTE — Progress Notes (Signed)
PROGRESS NOTE    Miranda Garcia  ZOX:[id number] DOB: [birth year] DOA: 08/25/2018 PCP: Glenda Chroman, MD     Brief Narrative:  85 y.o. female with medical history significant for atrial fibrillation on Eliquis, insulin-dependent diabetes mellitus, chronic diastolic CHF, and renal insufficiency followed by nephrology, now presenting to the emergency department for evaluation of shortness of breath and orthopnea. Patient has had difficulty breathing, particularly at night or with exertion for several weeks now, improved transiently when she had increased her Lasix for a few days a week ago, but then worsened again when she went back to the prior dose.  Over the last several days, patient has become short of breath while at rest.  She denies any chest pain or palpitations, denies fevers or chills, has not been coughing, denies sick contacts.  She has been sitting up to sleep recently, was previously using 6 pillows, but continues to wake up overnight with shortness of breath.  Patient states that her kidney doctor told her to come to the hospital to be admitted for further evaluation and management.   Assessment & Plan: 1-Acute on chronic diastolic heart failure -Patient requiring oxygen supplementation, still complaining of orthopnea and with shortness of breath on minimal exertion. -On physical exam there is signs of fluid overload (bibasilar crackles and complaints of orthopnea). -Continue IV diuresis, but lasix dose adjusted as Cr level trending up. -Low-sodium diet, daily weights and strict intake/output -Nephrology service has been consulted and will follow recommendations  2-chronic atrial fibrillation ([place]) -Rate controlled -Continue Cardizem and carvedilol -CHADsVASC score 4-5 -continue eliquis  3-CKD (chronic kidney disease), stage IV (HCC) AKI likely secondary to progression of chronic kidney disease. -with changes in her GFR  suggesting progression of renal failure. -nephrology  consulted -continue low sodium diet and current IV diuresis -Cr worsening any urine output is also not picking up despite increasing dose of the Lasix.  At present it appears that the patient may end up on IHD.  Appreciate nephrology input.  4-Anemia due to chronic illness -IV iron and Epogen therapy as per nephrology discretion. -No signs of overt bleeding -continue to follow hemoglobin trend.  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  6-lipidemia -Continue statins  7-class II obesity -Body mass index is 36.71 kg/m. -Low calorie diet, portion control and increase physical activity discussed with patient.  8.  Asymptomatic bacteriuria. The patient does not have any evidence of fever or chills.  No complaints of burning urination.  No nausea no vomiting. UA on the time of admission showed WBC present. At present I do not feel that there is any indication for further work-up or treatment.  DVT prophylaxis: Chronically on Eliquis.  May require adjustment depending on renal function  Code Status: Full code Family Communication: None at bedside Disposition Plan: May require HD.  Continue to monitor.  Consultants:   Nephrology service.  Procedures:   See below for x-ray reports  Antimicrobials:  Anti-infectives (From admission, onward)   None      Subjective: Still has orthopnea and PND  Objective: Vitals:   08/28/18 1926 08/29/18 0420 08/29/18 0756 08/29/18 1734  BP: 133/64 (!) 150/56 (!) 148/59 (!) 139/57  Pulse: 73 81 83 86  Resp: 18 18    Temp: 98.3 F (36.8 C) 98.2 F (36.8 C)    TempSrc: Oral Oral  SpO2: 97% 96%    Weight:  94 kg    Height:        Intake/Output Summary (Last 24 hours) at 08/29/2018 1810 Last data filed at 08/29/2018 1744 Gross per 24  hour  Intake 120 ml  Output 1450 ml  Net -1330 ml   Filed Weights   08/27/18 0411 08/28/18 0032 08/29/18 0420  Weight: 93.3 kg 94 kg 94 kg    Examination: General exam: Alert, awake, oriented x 3, afebrile, no CP, no nausea, no vomiting. Reports improvement tin her urine output; still SOB, with complaints of orthopnea and requiring oxygen supplementation 2L Respiratory system: fine crackles at the bases, no wheezing, no using accessory muscles.  Cardiovascular system:RRR. No murmurs, rubs, gallops. Mild JVD appreciated on exam. Gastrointestinal system: Abdomen is nondistended, soft and nontender. No organomegaly or masses felt. Normal bowel sounds heard. Central nervous system: Alert and oriented. No focal neurological deficits. Extremities: No Cyanosis or clubbing, positive 1+ edema bilaterally  +pedal pulses Skin: No rashes, lesions or ulcers Psychiatry: Judgement and insight appear normal. Mood & affect appropriate.   Data Reviewed: I have personally reviewed following labs and imaging studies  CBC: Recent Labs  Lab 08/25/18 1840 08/26/18 0655  WBC 7.3 5.9  HGB 10.8* 10.1*  HCT 34.1* 31.6*  MCV 95.3 95.5  PLT 123* 053*   Basic Metabolic Panel: Recent Labs  Lab 08/25/18 1840 08/26/18 0516 08/27/18 0538 08/28/18 0537 08/29/18 0354  NA 141 144 142 138 138  K 3.7 3.6 3.7 3.9 4.1  CL 110 111 108 106 105  CO2 21* 24 24 22 22   GLUCOSE 182* 149* 142* 153* 154*  BUN 56* 55* 60* 68* 77*  CREATININE 3.20* 3.28* 3.68* 4.10* 4.29*  CALCIUM 8.9 8.8* 8.4* 8.2* 8.0*   GFR: Estimated Creatinine Clearance: 10.4 mL/min (A) (by C-G formula based on SCr of 4.29 mg/dL (H)).   Liver Function Tests: Recent Labs  Lab 08/25/18 1840  AST 18  ALT 13  ALKPHOS 56  BILITOT 1.2  PROT 6.4*  ALBUMIN 3.3*   Cardiac Enzymes: Recent Labs  Lab 08/25/18 1840  TROPONINI <0.03   BNP (last 3 results) Recent Labs    05/31/18 1621  PROBNP 466.0*   HbA1C: No results for input(s):  HGBA1C in the last 72 hours.   CBG: Recent Labs  Lab 08/28/18 1723 08/28/18 2108 08/29/18 0641 08/29/18 1115 08/29/18 1627  GLUCAP 169* 157* 129* 172* 178*   Anemia Panel: No results for input(s): VITAMINB12, FOLATE, FERRITIN, TIBC, IRON, RETICCTPCT in the last 72 hours. Urine analysis:    Component Value Date/Time   COLORURINE STRAW (A) 08/28/2018 2246   APPEARANCEUR CLEAR 08/28/2018 2246   LABSPEC 1.006 08/28/2018 2246   PHURINE 5.0 08/28/2018 2246   GLUCOSEU NEGATIVE 08/28/2018 2246   HGBUR NEGATIVE 08/28/2018 2246   BILIRUBINUR NEGATIVE 08/28/2018 2246   KETONESUR NEGATIVE 08/28/2018 2246   PROTEINUR NEGATIVE 08/28/2018 2246   NITRITE NEGATIVE 08/28/2018 2246   LEUKOCYTESUR MODERATE (A) 08/28/2018 2246    Recent Results (from the past 240 hour(s))  SARS Coronavirus 2 (CEPHEID - Performed in [place] hospital lab), Hosp Order     Status: None   Collection Time: 08/25/18  6:49 PM  Result Value Ref Range Status   SARS Coronavirus 2 NEGATIVE NEGATIVE Final    Comment: (NOTE) If result is NEGATIVE SARS-CoV-2 target nucleic acids are NOT DETECTED. The SARS-CoV-2 RNA is generally detectable in upper and lower  respiratory specimens during the acute phase  of infection. The lowest  concentration of SARS-CoV-2 viral copies this assay can detect is 250  copies / mL. A negative result does not preclude SARS-CoV-2 infection  and should not be used as the sole basis for treatment or other  patient management decisions.  A negative result may occur with  improper specimen collection / handling, submission of specimen other  than nasopharyngeal swab, presence of viral mutation(s) within the  areas targeted by this assay, and inadequate number of viral copies  (<250 copies / mL). A negative result must be combined with clinical  observations, patient history, and epidemiological information. If result is POSITIVE SARS-CoV-2 target nucleic acids are DETECTED. The SARS-CoV-2  RNA is generally detectable in upper and lower  respiratory specimens dur ing the acute phase of infection.  Positive  results are indicative of active infection with SARS-CoV-2.  Clinical  correlation with patient history and other diagnostic information is  necessary to determine patient infection status.  Positive results do  not rule out bacterial infection or co-infection with other viruses. If result is PRESUMPTIVE POSTIVE SARS-CoV-2 nucleic acids MAY BE PRESENT.   A presumptive positive result was obtained on the submitted specimen  and confirmed on repeat testing.  While 2019 novel coronavirus  (SARS-CoV-2) nucleic acids may be present in the submitted sample  additional confirmatory testing may be necessary for epidemiological  and / or clinical management purposes  to differentiate between  SARS-CoV-2 and other Sarbecovirus currently known to infect humans.  If clinically indicated additional testing with an alternate test  methodology [phone]) is advised. The SARS-CoV-2 RNA is generally  detectable in upper and lower respiratory sp ecimens during the acute  phase of infection. The expected result is Negative. Fact Sheet for Patients:  StrictlyIdeas.no Fact Sheet for Healthcare Providers: BankingDealers.co.za This test is not yet approved or cleared by the [place] FDA and has been authorized for detection and/or diagnosis of SARS-CoV-2 by FDA under an Emergency Use Authorization (EUA).  This EUA will remain in effect (meaning this test can be used) for the duration of the COVID-19 declaration under Section 564(b)(1) of the Act, 21 U.S.C. section 360bbb-3(b)(1), unless the authorization is terminated or revoked sooner. Performed at [facility], [place] [address] [place], [place] [address]      Radiology Studies: No results found.  Scheduled Meds: . carvedilol  6.25 mg Oral BID WC  . diltiazem  180 mg Oral  Daily  . furosemide  80 mg Intravenous BID  . insulin aspart  0-5 Units Subcutaneous QHS  . insulin aspart  0-9 Units Subcutaneous TID WC  . insulin detemir  9 Units Subcutaneous QHS  . potassium chloride  20 mEq Oral Daily  . pravastatin  20 mg Oral q1800  . sodium chloride flush  3 mL Intravenous Q12H   Continuous Infusions: . sodium chloride    . ferumoxytol Stopped (08/27/18 1640)  . heparin       LOS: 3 days    Author:  Berle Mull, MD [facility] 08/29/2018   To reach On-call, see care teams to locate the attending and reach out to them via [email or web address]. If 7PM-7AM, please contact night-coverage If you still have difficulty reaching the attending provider, please page the [facility] (Director on Call) for [facility] on amion for assistance.

## 2018-08-29 NOTE — Care Management Important Message (Signed)
Important Message  Patient Details  Name: Miranda Garcia MRN: [id number] Date of Birth: 18-Dec-1930   Medicare Important Message Given:  Yes    [name] 08/29/2018, 1:23 PM

## 2018-08-29 NOTE — Progress Notes (Signed)
[facility] Progress Note    Assessment/ Plan:   85 y.o.femaleDM,afib on Eliquis,possibly diastolic congestive heart failure here w/ worsening of renal function over the last few months from the high ones now to the threes. Patient actually saw Dr. Hollie Salk as an outpatient on 08/17/2018. She had concerns of possible pulmonary renal syndrome but work-up was not remarkable for that. Patient is known to have a solitary kidney. When her creatinine started to go up her ARB was discontinued but that did not stop the progression. Here w/ worsening volume overload w/ Cr of 3.2 worsened to 3.68.  1.Renal-acute on chronic renal insufficiency versus just progressive renal insufficiency an 85 year old lady with a solitary kidney. She has had a significant work-up that appears negative for kidney specific disease. She did have granular casts which begs a question if her blood pressure has been too low. Right now, volume overloaded needs high-dose Lasix.If Lasix is not successful or leads to progressive uremia then we have a more serious conversation about where this could be headed.   - Dialysis has been mentioned to the patient and the daughter; I spoke with the daughter today but she would like a video conference call if we decide to move towards dialysis. There are no absolute indications for dialysis but her renal function cont to worsen despite clinically her feeling better.  - Bec of the CHF it will be difficult to manage her advanced CKD without RRT. Will  Teleconference w/ daughter today. Certainly AIN is possibly but can't make the diagnosis without a biopsy and w/ solitary kidney that may push her over to needing dialysis immediately.  2. Hypertension/volume-volume overload with symptoms. Will initiate relatively high-dose Lasix at 80 mg IV every 12 hours. I will discontinue hydralazine and decrease carvedilol so that blood pressure does not get too low 3. Anemia  -not a major issue right now, hemoglobin close to 10. She is iron deficient, will go ahead and dose with Feraheme 4.Hypokalemia-anticipated will lower with diuresis, will add on supplement  Subjective:   Mild dyspnea but no worse. Denies n now but was nauseous at home. Poor appetite.   Objective:   BP (!) 148/59   Pulse 83   Temp 98.2 F (36.8 C) (Oral)   Resp 18   Ht 5\' 3"  (1.6 m)   Wt 94 kg   SpO2 96%   BMI 36.71 kg/m   Intake/Output Summary (Last 24 hours) at 08/29/2018 1229 Last data filed at 08/29/2018 0808 Gross per 24 hour  Intake 120 ml  Output 800 ml  Net -680 ml   Weight change: -0.031 kg  Physical Exam: General:elderly WF  HEENT:EOMI Neck:positive for JVD Heart:RRR Lungs:dec BS Abdomen:obese, soft Extremities:pitting edema Skin:warm and dry Neuro:alert- Seems non focal   Imaging: No results found.  Labs: BMET Recent Labs  Lab 08/23/18 1633 08/25/18 1840 08/26/18 0516 08/27/18 0538 08/28/18 0537 08/29/18 0354  NA 139 141 144 142 138 138  K 3.5 3.7 3.6 3.7 3.9 4.1  CL 106 110 111 108 106 105  CO2 20 21* 24 24 22 22   GLUCOSE 181* 182* 149* 142* 153* 154*  BUN 56* 56* 55* 60* 68* 77*  CREATININE 3.18* 3.20* 3.28* 3.68* 4.10* 4.29*  CALCIUM 8.7 8.9 8.8* 8.4* 8.2* 8.0*   CBC Recent Labs  Lab 08/25/18 1840 08/26/18 0655  WBC 7.3 5.9  HGB 10.8* 10.1*  HCT 34.1* 31.6*  MCV 95.3 95.5  PLT 123* 127*    Medications:    .  apixaban  2.5 mg Oral BID  . carvedilol  6.25 mg Oral BID WC  . diltiazem  180 mg Oral Daily  . furosemide  80 mg Intravenous BID  . insulin aspart  0-5 Units Subcutaneous QHS  . insulin aspart  0-9 Units Subcutaneous TID WC  . insulin detemir  9 Units Subcutaneous QHS  . potassium chloride  20 mEq Oral Daily  . pravastatin  20 mg Oral q1800  . sodium chloride flush  3 mL Intravenous Q12H      Otelia Santee, MD 08/29/2018, 12:29 PM

## 2018-08-29 NOTE — Plan of Care (Signed)
  Problem: Clinical Measurements: Goal: Will remain free from infection Outcome: Progressing Note:  Pt has remained free from signs of infection during  my care.    Problem: Activity: Goal: Risk for activity intolerance will decrease Outcome: Progressing Note:  Pt has been able to ambulate to the [facility] during my shift.    Problem: Nutrition: Goal: Adequate nutrition will be maintained Outcome: Progressing Note:  Pt has been to tolerate all meals during my shift.    Problem: Pain Managment: Goal: General experience of comfort will improve Note:  Pt has had no complaints of pain during my shift.

## 2018-08-29 NOTE — Progress Notes (Signed)
ANTICOAGULATION CONSULT NOTE - Initial Consult  Pharmacy Consult for heparin Indication: atrial fibrillation  Allergies  Allergen Reactions  . Atorvastatin Other (See Comments)    Leg pains  . Contrast Media [Iodinated Diagnostic Agents]     Pt has 1 full functioning kidney  . Penicillins Nausea And Vomiting and Rash    Did it involve swelling of the face/tongue/throat, SOB, or low BP? No Did it involve sudden or severe rash/hives, skin peeling, or any reaction on the inside of your mouth or nose? Yes Did you need to seek medical attention at a hospital or doctor's office? Yes When did it last happen?65 yrs ago If all above answers are "NO", may proceed with cephalosporin use.     Patient Measurements: Height: 5\' 3"  (160 cm) Weight: 207 lb 3.7 oz (94 kg) IBW/kg (Calculated) : 52.4 Heparin Dosing Weight: 74kg  Vital Signs: Temp: 98.2 F (36.8 C) (06/09 0420) Temp Source: Oral (06/09 0420) BP: 148/59 (06/09 0756) Pulse Rate: 83 (06/09 0756)  Labs: Recent Labs    08/27/18 0538 08/28/18 0537 08/29/18 0354  CREATININE 3.68* 4.10* 4.29*    Estimated Creatinine Clearance: 10.4 mL/min (A) (by C-G formula based on SCr of 4.29 mg/dL (H)).   Medical History: Past Medical History:  Diagnosis Date  . Arthritis   . Atrial fibrillation ([place])   . CHF (congestive heart failure) ([place])   . Diabetes mellitus type II, controlled ([place])    x 15 yrs  . Elevated troponin   . Hemorrhoids   . Hypertension   . Renal insufficiency      Assessment: 24 yoF with hx PAF on Eliquis at home. Pt has baseline Cr of ~1.6 which has progressively worsened to anuria and Cr of 4.29 today. Renal considering RRT options and also need for biopsy. With ongoing AKI and likely need for procedures will hold further Eliquis and begin heparin infusion. Pt received Eliquis this morning ~1000.  Goal of Therapy:  Heparin level 0.3-0.7 units/ml aPTT 66-102 seconds Monitor platelets by  anticoagulation protocol: Yes   Plan:  -Start heparin 1100 units/hr with no bolus tonight at 2200 -Check 8hr heparin level and aPTT -Will need to follow aPTTs initially   Arrie Senate, PharmD, BCPS Clinical Pharmacist [phone] Please check AMION for all [place] numbers 08/29/2018

## 2018-08-30 LAB — BASIC METABOLIC PANEL
Anion gap: 11 (ref 5–15)
BUN: 82 mg/dL — ABNORMAL HIGH (ref 8–23)
CO2: 25 mmol/L (ref 22–32)
Calcium: 8 mg/dL — ABNORMAL LOW (ref 8.9–10.3)
Chloride: 102 mmol/L (ref 98–111)
Creatinine, Ser: 3.79 mg/dL — ABNORMAL HIGH (ref 0.44–1.00)
GFR calc Af Amer: 12 mL/min — ABNORMAL LOW (ref >60)
GFR calc non Af Amer: 10 mL/min — ABNORMAL LOW (ref >60)
Glucose, Bld: 127 mg/dL — ABNORMAL HIGH (ref 70–99)
Potassium: 3.5 mmol/L (ref 3.5–5.1)
Sodium: 138 mmol/L (ref 135–145)

## 2018-08-30 LAB — CBC
HCT: 31 % — ABNORMAL LOW (ref 36.0–46.0)
Hemoglobin: 10 g/dL — ABNORMAL LOW (ref 12.0–15.0)
MCH: 30.4 pg (ref 26.0–34.0)
MCHC: 32.3 g/dL (ref 30.0–36.0)
MCV: 94.2 fL (ref 80.0–100.0)
Platelets: 130 10*3/uL — ABNORMAL LOW (ref 150–400)
RBC: 3.29 MIL/uL — ABNORMAL LOW (ref 3.87–5.11)
RDW: 13.2 % (ref 11.5–15.5)
WBC: 7.4 10*3/uL (ref 4.0–10.5)
nRBC: 0 % (ref 0.0–0.2)

## 2018-08-30 LAB — URINE CULTURE

## 2018-08-30 LAB — GLUCOSE, CAPILLARY
Glucose-Capillary: 110 mg/dL — ABNORMAL HIGH (ref 70–99)
Glucose-Capillary: 145 mg/dL — ABNORMAL HIGH (ref 70–99)
Glucose-Capillary: 150 mg/dL — ABNORMAL HIGH (ref 70–99)
Glucose-Capillary: 178 mg/dL — ABNORMAL HIGH (ref 70–99)

## 2018-08-30 LAB — APTT: aPTT: 133 seconds — ABNORMAL HIGH (ref 24–36)

## 2018-08-30 LAB — HEPARIN LEVEL (UNFRACTIONATED): Heparin Unfractionated: 2 IU/mL — ABNORMAL HIGH (ref 0.30–0.70)

## 2018-08-30 MED ORDER — APIXABAN 2.5 MG PO TABS
2.5000 mg | ORAL_TABLET | Freq: Two times a day (BID) | ORAL | Status: DC
  Administered 2018-08-30 – 2018-08-31 (×3): 2.5 mg via ORAL
  Filled 2018-08-30 (×3): qty 1

## 2018-08-30 MED ORDER — HEPARIN (PORCINE) 25000 UT/250ML-% IV SOLN
900.0000 [IU]/h | INTRAVENOUS | Status: DC
  Administered 2018-08-30: 900 [IU]/h via INTRAVENOUS

## 2018-08-30 MED ORDER — FUROSEMIDE 40 MG PO TABS
40.0000 mg | ORAL_TABLET | Freq: Two times a day (BID) | ORAL | Status: DC
  Administered 2018-08-30 – 2018-08-31 (×2): 40 mg via ORAL
  Filled 2018-08-30 (×2): qty 1

## 2018-08-30 NOTE — Progress Notes (Signed)
PCCM: thanks for update. Thanks for calling her [name], DO [facility] 08/30/2018 9:15 AM

## 2018-08-30 NOTE — Progress Notes (Signed)
   08/30/18 0835  Mobility  Activity Ambulated in hall;Dangled on edge of bed  Range of Motion Active;All extremities  Level of Assistance Standby assist, set-up cues, supervision of patient - no hands on  Assistive Device Other (Comment) (IV Pole)  Minutes Stood 5 minutes  Minutes Ambulated 5 minutes  Distance Ambulated (ft) 400 ft  Mobility Response Tolerated well (Needed one standing rest break)  Bed Position Semi-fowlers   SATURATION QUALIFICATIONS: (This note is used to comply with regulatory documentation for home oxygen)  Patient Saturations on Room Air at Rest = 95%  Patient Saturations on Room Air while Ambulating = 92%  Patient Saturations on n/a Liters of oxygen while Ambulating = n/a%  Please briefly explain why patient needs home oxygen:

## 2018-08-30 NOTE — Plan of Care (Signed)
  Problem: Education: Goal: Knowledge of General Education information will improve Description: Including pain rating scale, medication(s)/side effects and non-pharmacologic comfort measures Outcome: Progressing   Problem: Health Behavior/Discharge Planning: Goal: Ability to manage health-related needs will improve Outcome: Progressing   Problem: Clinical Measurements: Goal: Respiratory complications will improve Outcome: Progressing   

## 2018-08-30 NOTE — Progress Notes (Addendum)
[facility] Progress Note    Assessment/ Plan:   85 y.o.femaleDM,afib on Eliquis,possibly diastolic congestive heart failurehere w/ worseningof renal function over the last few months from the high ones now to the threes. Patient actually saw Dr. Hollie Salk as an outpatient on 08/17/2018. She had concerns of possible pulmonary renal syndrome but work-up was not remarkable for that. Patient is known to have a solitary kidney. When her creatinine started to go up her ARB was discontinued but that did not stop the progression.Here w/worseningvolume overloadw/ Cr of 3.2 worsened to 3.68.  1.Renal-acute on chronic renal insufficiency versus just progressive renal insufficiency an 85 year old lady with a solitary kidney. She has had a significant work-up that appears negative for kidney specific disease. She did have granular casts which begs a question if her blood pressure has been too low. Right now, volume overloaded needs high-dose Lasix.If Lasix is not successful or leads to progressive uremia then we have a more serious conversation about where this could be headed.  -2L during this hospitalization  -Dialysis has been mentioned to the patient andthedaughter; I spoke with the daughter again 6/9 and we did a conference call w/ the pt and she would like dialysis if necessary.There are noabsoluteindications for dialysis and fortunately her renal function finally has improved.  - Bec of the CHF it may be difficult to manage her advanced CKD without RRT.   - May transition to orals (home regimen was 20mg  QOD but likely will need twice a day dosing) + f/u w/ Dr. Hollie Salk in 2-4 weeks on d/c. She had an appt today but that was d/c'd.  - Urine culture w/ multiple organisms and may represent colonization or contamination.  2. Hypertension/volume-volume overload with symptoms. Currently on Lasix at 80 mg IV every 12 hours. I discontinued hydralazine and  decrease carvedilol so that blood pressure does not get too low 3. Anemia -not a major issue right now, hemoglobin close to 10.She is iron deficient, will go ahead and dose with Feraheme 4.Hypokalemia-anticipated will lower with diuresis, will need to be careful w/ replacement upon d/c as she will be on lower dose of diuretics  Subjective:   Mild dyspnea but no worse. Denies n now but was nauseous at home. Poor appetite.   Objective:   BP (!) 128/55 (BP Location: Right Arm)   Pulse 90   Temp 98.1 F (36.7 C) (Oral)   Resp 18   Ht 5\' 3"  (1.6 m)   Wt 93.1 kg   SpO2 98%   BMI 36.36 kg/m   Intake/Output Summary (Last 24 hours) at 08/30/2018 0816 Last data filed at 08/30/2018 8315 Gross per 24 hour  Intake 332.19 ml  Output 1450 ml  Net -1117.81 ml   Weight change: -0.9 kg  Physical Exam: General:elderly WF  HEENT:EOMI Neck:positive for JVD Heart:RRR Lungs:dec BS Abdomen:obese, soft Extremities:pitting [place] improving Skin:warm and dry Neuro:alert- Seems non focal  Imaging: No results found.  Labs: BMET Recent Labs  Lab 08/23/18 1633 08/25/18 1840 08/26/18 0516 08/27/18 0538 08/28/18 0537 08/29/18 0354 08/30/18 0627  NA 139 141 144 142 138 138 138  K 3.5 3.7 3.6 3.7 3.9 4.1 3.5  CL 106 110 111 108 106 105 102  CO2 20 21* 24 24 22 22 25   GLUCOSE 181* 182* 149* 142* 153* 154* 127*  BUN 56* 56* 55* 60* 68* 77* 82*  CREATININE 3.18* 3.20* 3.28* 3.68* 4.10* 4.29* 3.79*  CALCIUM 8.7 8.9 8.8* 8.4* 8.2* 8.0* 8.0*   CBC  Recent Labs  Lab 08/25/18 1840 08/26/18 0655 08/30/18 0627  WBC 7.3 5.9 7.4  HGB 10.8* 10.1* 10.0*  HCT 34.1* 31.6* 31.0*  MCV 95.3 95.5 94.2  PLT 123* 127* 130*    Medications:    . carvedilol  6.25 mg Oral BID WC  . diltiazem  180 mg Oral Daily  . furosemide  80 mg Intravenous BID  . insulin aspart  0-5 Units Subcutaneous QHS  . insulin aspart  0-9 Units Subcutaneous TID WC  . insulin detemir  9 Units  Subcutaneous QHS  . potassium chloride  20 mEq Oral Daily  . pravastatin  20 mg Oral q1800  . sodium chloride flush  3 mL Intravenous Q12H      Otelia Santee, MD 08/30/2018, 8:16 AM

## 2018-08-30 NOTE — Progress Notes (Addendum)
ANTICOAGULATION CONSULT NOTE - [facility] for heparin Indication: atrial fibrillation  Allergies  Allergen Reactions  . Atorvastatin Other (See Comments)    Leg pains  . Contrast Media [Iodinated Diagnostic Agents]     Pt has 1 full functioning kidney  . Penicillins Nausea And Vomiting and Rash    Did it involve swelling of the face/tongue/throat, SOB, or low BP? No Did it involve sudden or severe rash/hives, skin peeling, or any reaction on the inside of your mouth or nose? Yes Did you need to seek medical attention at a hospital or doctor's office? Yes When did it last happen?65 yrs ago If all above answers are "NO", may proceed with cephalosporin use.     Patient Measurements: Height: 5\' 3"  (160 cm) Weight: 205 lb 4 oz (93.1 kg) IBW/kg (Calculated) : 52.4 Heparin Dosing Weight: 74kg  Vital Signs: Temp: 98.1 F (36.7 C) (06/10 0424) Temp Source: Oral (06/10 0424) BP: 128/55 (06/10 0424) Pulse Rate: 90 (06/10 0424)  Labs: Recent Labs    08/28/18 0537 08/29/18 0354 08/30/18 0627  HGB  --   --  10.0*  HCT  --   --  31.0*  PLT  --   --  130*  APTT  --   --  133*  HEPARINUNFRC  --   --  2.00*  CREATININE 4.10* 4.29* 3.79*    Estimated Creatinine Clearance: 11.8 mL/min (A) (by C-G formula based on SCr of 3.79 mg/dL (H)).   Medical History: Past Medical History:  Diagnosis Date  . Arthritis   . Atrial fibrillation ([place])   . CHF (congestive heart failure) ([place])   . Diabetes mellitus type II, controlled ([place])    x 15 yrs  . Elevated troponin   . Hemorrhoids   . Hypertension   . Renal insufficiency      Assessment: 40 yoF with hx PAF on Eliquis at home. Pt has baseline Cr of ~1.6 which has progressively worsened to anuria and Cr >4 so pt transitioned to heparin infusion while RRT plans discussed.  Initial heparin level elevated by apixaban as expected, aPTT above goal at 133 seconds. No S/Sx bleeding per nursing, CBC  stable.  Goal of Therapy:  Heparin level 0.3-0.7 units/ml aPTT 66-102 seconds Monitor platelets by anticoagulation protocol: Yes   Plan:  -Hold heparin x30 minutes then resume 900 units/hr -Recheck aPTT tonight    ADDENDUM: Cr and UOP improving, no plans for HD/procedures needed.  Plan:  Stop heparin Apixaban 2.5mg  BID    Arrie Senate, PharmD, BCPS Clinical Pharmacist [phone] Please check AMION for all [place] numbers 08/30/2018

## 2018-08-30 NOTE — Progress Notes (Signed)
PROGRESS NOTE    Miranda Garcia  FFM:[id number] DOB: 12-12-1930 DOA: 08/25/2018 PCP: Glenda Chroman, MD     Brief Narrative:  85 y.o. female with medical history significant for atrial fibrillation on Eliquis, insulin-dependent diabetes mellitus, chronic diastolic CHF, and renal insufficiency followed by nephrology, now presenting to the emergency department for evaluation of shortness of breath and orthopnea. Patient has had difficulty breathing, particularly at night or with exertion for several weeks now, improved transiently when she had increased her Lasix for a few days a week ago, but then worsened again when she went back to the prior dose.  Over the last several days, patient has become short of breath while at rest.  She denies any chest pain or palpitations, denies fevers or chills, has not been coughing, denies sick contacts.  She has been sitting up to sleep recently, was previously using 6 pillows, but continues to wake up overnight with shortness of breath.  Patient states that her kidney doctor told her to come to the hospital to be admitted for further evaluation and management.   Assessment & Plan: 1-Acute on chronic diastolic heart failure -Patient requiring oxygen supplementation, still complaining of orthopnea and with shortness of breath on minimal exertion. -On physical exam there is signs of fluid overload (bibasilar crackles and complaints of orthopnea). -Treated with IV diuresis, now transitioning to oral Lasix.  Patient is on 20 mg of oral Lasix at home.  Given her renal function I do not think that that will be adequate.  We will transition her to 40 mg twice daily Lasix and monitor in the hospital. -Low-sodium diet, daily weights and strict intake/output -Nephrology service has been consulted and will follow recommendations  2-chronic atrial fibrillation ([place]) -Rate controlled -Continue Cardizem and carvedilol -CHADsVASC score 4-5 -continue eliquis  3-CKD  (chronic kidney disease), stage IV (HCC) AKI likely secondary to progression of chronic kidney disease. -with changes in her GFR  suggesting progression of renal failure. -nephrology consulted -continue low sodium diet and current medication -Renal function now stable.  Appreciate nephrology assistance.  Feels like patient can be discharged home safely without any further work-up or treatment or initiation for HD.  4-Anemia due to chronic illness -IV iron and Epogen therapy as per nephrology discretion. -No signs of overt bleeding -continue to follow hemoglobin trend.  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  6-lipidemia -Continue statins  7-class II obesity -Body mass index is 36.36 kg/m. -Low calorie diet, portion control and increase physical activity discussed with patient.  8.  Asymptomatic bacteriuria. The patient does not have any evidence of fever or chills.  No complaints of burning urination.  No nausea no vomiting. UA on the time of admission showed WBC present. At present I do not feel that there is any indication for further work-up or treatment.  DVT prophylaxis: Chronically on Eliquis.  May require adjustment depending on renal function  Code Status: Full code Family Communication: None at bedside Disposition Plan: May require HD.  Continue to monitor.  Consultants:   Nephrology service.  Procedures:   See below for x-ray reports  Antimicrobials:  Anti-infectives (From admission, onward)   None      Subjective: Orthopnea better.  No PND.  No chest pain abdominal pain.  Patient does have concerns that if she gets short of breath that  she require oxygen or not while she directly did not qualify for home oxygen evaluation.  Objective: Vitals:    08/30/18 0822 08/30/18 1155 08/30/18 1245 08/30/18 1632  BP: 123/87 (!) 116/51  (!) 142/56  Pulse: 96 83    Resp: 18 20    Temp:  98.2 F (36.8 C)    TempSrc:  Oral    SpO2: 100% 91% 95% 93%  Weight:      Height:        Intake/Output Summary (Last 24 hours) at 08/30/2018 1952 Last data filed at 08/30/2018 1700 Gross per 24 hour  Intake 1532.19 ml  Output 1702 ml  Net -169.81 ml   Filed Weights   08/28/18 0032 08/29/18 0420 08/30/18 0424  Weight: 94 kg 94 kg 93.1 kg    Examination: General exam: Alert, awake, oriented x 3, afebrile, no CP, no nausea, no vomiting. Reports improvement tin her urine output; still SOB, with complaints of orthopnea and requiring oxygen supplementation 2L Respiratory system: fine crackles at the bases, no wheezing, no using accessory muscles.  Cardiovascular system:RRR. No murmurs, rubs, gallops. Mild JVD appreciated on exam. Gastrointestinal system: Abdomen is nondistended, soft and nontender. No organomegaly or masses felt. Normal bowel sounds heard. Central nervous system: Alert and oriented. No focal neurological deficits. Extremities: No Cyanosis or clubbing, positive 1+ edema bilaterally  +pedal pulses Skin: No rashes, lesions or ulcers Psychiatry: Judgement and insight appear normal. Mood & affect appropriate.   Data Reviewed: I have personally reviewed following labs and imaging studies  CBC: Recent Labs  Lab 08/25/18 1840 08/26/18 0655 08/30/18 0627  WBC 7.3 5.9 7.4  HGB 10.8* 10.1* 10.0*  HCT 34.1* 31.6* 31.0*  MCV 95.3 95.5 94.2  PLT 123* 127* 010*   Basic Metabolic Panel: Recent Labs  Lab 08/26/18 0516 08/27/18 0538 08/28/18 0537 08/29/18 0354 08/30/18 0627  NA 144 142 138 138 138  K 3.6 3.7 3.9 4.1 3.5  CL 111 108 106 105 102  CO2 24 24 22 22 25   GLUCOSE 149* 142* 153* 154* 127*  BUN 55* 60* 68* 77* 82*  CREATININE 3.28* 3.68* 4.10* 4.29* 3.79*  CALCIUM 8.8* 8.4* 8.2* 8.0* 8.0*   GFR: Estimated Creatinine  Clearance: 11.8 mL/min (A) (by C-G formula based on SCr of 3.79 mg/dL (H)).   Liver Function Tests: Recent Labs  Lab 08/25/18 1840  AST 18  ALT 13  ALKPHOS 56  BILITOT 1.2  PROT 6.4*  ALBUMIN 3.3*   Cardiac Enzymes: Recent Labs  Lab 08/25/18 1840  TROPONINI <0.03   BNP (last 3 results) Recent Labs    05/31/18 1621  PROBNP 466.0*   HbA1C: No results for input(s): HGBA1C in the last 72 hours.   CBG: Recent Labs  Lab 08/29/18 1627 08/29/18 2116 08/30/18 0608 08/30/18 1152 08/30/18 1613  GLUCAP 178* 177* 110* 145* 150*   Anemia Panel: No results for input(s): VITAMINB12, FOLATE, FERRITIN, TIBC, IRON, RETICCTPCT in the last 72 hours. Urine analysis:    Component Value Date/Time   COLORURINE STRAW (A) 08/28/2018 2246   APPEARANCEUR CLEAR 08/28/2018 2246   LABSPEC 1.006 08/28/2018 2246   PHURINE 5.0 08/28/2018 2246   GLUCOSEU NEGATIVE 08/28/2018 2246   HGBUR NEGATIVE 08/28/2018 2246   BILIRUBINUR NEGATIVE 08/28/2018 2246   KETONESUR NEGATIVE 08/28/2018 2246   PROTEINUR NEGATIVE 08/28/2018 2246   NITRITE NEGATIVE 08/28/2018 2246   LEUKOCYTESUR MODERATE (A) 08/28/2018 2246    Recent Results (from the past 240 hour(s))  SARS Coronavirus 2 (CEPHEID -  Performed in [facility] hospital lab), Hosp Order     Status: None   Collection Time: 08/25/18  6:49 PM  Result Value Ref Range Status   SARS Coronavirus 2 NEGATIVE NEGATIVE Final    Comment: (NOTE) If result is NEGATIVE SARS-CoV-2 target nucleic acids are NOT DETECTED. The SARS-CoV-2 RNA is generally detectable in upper and lower  respiratory specimens during the acute phase of infection. The lowest  concentration of SARS-CoV-2 viral copies this assay can detect is 250  copies / mL. A negative result does not preclude SARS-CoV-2 infection  and should not be used as the sole basis for treatment or other  patient management decisions.  A negative result may occur with  improper specimen collection / handling,  submission of specimen other  than nasopharyngeal swab, presence of viral mutation(s) within the  areas targeted by this assay, and inadequate number of viral copies  (<250 copies / mL). A negative result must be combined with clinical  observations, patient history, and epidemiological information. If result is POSITIVE SARS-CoV-2 target nucleic acids are DETECTED. The SARS-CoV-2 RNA is generally detectable in upper and lower  respiratory specimens dur ing the acute phase of infection.  Positive  results are indicative of active infection with SARS-CoV-2.  Clinical  correlation with patient history and other diagnostic information is  necessary to determine patient infection status.  Positive results do  not rule out bacterial infection or co-infection with other viruses. If result is PRESUMPTIVE POSTIVE SARS-CoV-2 nucleic acids MAY BE PRESENT.   A presumptive positive result was obtained on the submitted specimen  and confirmed on repeat testing.  While 2019 novel coronavirus  (SARS-CoV-2) nucleic acids may be present in the submitted sample  additional confirmatory testing may be necessary for epidemiological  and / or clinical management purposes  to differentiate between  SARS-CoV-2 and other Sarbecovirus currently known to infect humans.  If clinically indicated additional testing with an alternate test  methodology [phone]) is advised. The SARS-CoV-2 RNA is generally  detectable in upper and lower respiratory sp ecimens during the acute  phase of infection. The expected result is Negative. Fact Sheet for Patients:  StrictlyIdeas.no Fact Sheet for Healthcare Providers: BankingDealers.co.za This test is not yet approved or cleared by the [place] FDA and has been authorized for detection and/or diagnosis of SARS-CoV-2 by FDA under an Emergency Use Authorization (EUA).  This EUA will remain in effect (meaning this test can be  used) for the duration of the COVID-19 declaration under Section 564(b)(1) of the Act, 21 U.S.C. section 360bbb-3(b)(1), unless the authorization is terminated or revoked sooner. Performed at [facility], [place] [address], [place], [place] [address]   Culture, Urine     Status: None   Collection Time: 08/28/18 10:50 PM  Result Value Ref Range Status   Specimen Description URINE, CLEAN CATCH  Final   Special Requests   Final    STERILE CONTAINER Performed at [facility], [place] [address], [place], [place] [address]    Culture   Final    Multiple bacterial morphotypes present, none predominant. Suggest appropriate recollection if clinically indicated.   Report Status 08/30/2018 FINAL  Final     Radiology Studies: No results found.  Scheduled Meds: . apixaban  2.5 mg Oral BID  . carvedilol  6.25 mg Oral BID WC  . diltiazem  180 mg Oral Daily  . furosemide  40 mg Oral BID  . insulin aspart  0-5 Units Subcutaneous  QHS  . insulin aspart  0-9 Units Subcutaneous TID WC  . insulin detemir  9 Units Subcutaneous QHS  . potassium chloride  20 mEq Oral Daily  . pravastatin  20 mg Oral q1800  . sodium chloride flush  3 mL Intravenous Q12H   Continuous Infusions: . sodium chloride    . ferumoxytol Stopped (08/27/18 1640)     LOS: 4 days    Author:  Berle Mull, MD [facility] 08/30/2018   To reach On-call, see care teams to locate the attending and reach out to them via [email or web address]. If 7PM-7AM, please contact night-coverage If you still have difficulty reaching the attending provider, please page the [facility] (Director on Call) for [facility] on amion for assistance.

## 2018-08-31 LAB — GLUCOSE, CAPILLARY
Glucose-Capillary: 114 mg/dL — ABNORMAL HIGH (ref 70–99)
Glucose-Capillary: 223 mg/dL — ABNORMAL HIGH (ref 70–99)
Glucose-Capillary: 123 mg/dL — ABNORMAL HIGH (ref 70–99)

## 2018-08-31 LAB — BASIC METABOLIC PANEL
Anion gap: 12 (ref 5–15)
BUN: 87 mg/dL — ABNORMAL HIGH (ref 8–23)
CO2: 26 mmol/L (ref 22–32)
Calcium: 8 mg/dL — ABNORMAL LOW (ref 8.9–10.3)
Chloride: 102 mmol/L (ref 98–111)
Creatinine, Ser: 3.8 mg/dL — ABNORMAL HIGH (ref 0.44–1.00)
GFR calc Af Amer: 12 mL/min — ABNORMAL LOW (ref >60)
GFR calc non Af Amer: 10 mL/min — ABNORMAL LOW (ref >60)
Glucose, Bld: 121 mg/dL — ABNORMAL HIGH (ref 70–99)
Potassium: 3.8 mmol/L (ref 3.5–5.1)
Sodium: 140 mmol/L (ref 135–145)

## 2018-08-31 LAB — CBC
HCT: 31.3 % — ABNORMAL LOW (ref 36.0–46.0)
Hemoglobin: 10.2 g/dL — ABNORMAL LOW (ref 12.0–15.0)
MCH: 30.5 pg (ref 26.0–34.0)
MCHC: 32.6 g/dL (ref 30.0–36.0)
MCV: 93.7 fL (ref 80.0–100.0)
Platelets: 141 10*3/uL — ABNORMAL LOW (ref 150–400)
RBC: 3.34 MIL/uL — ABNORMAL LOW (ref 3.87–5.11)
RDW: 13.2 % (ref 11.5–15.5)
WBC: 7.9 10*3/uL (ref 4.0–10.5)
nRBC: 0 % (ref 0.0–0.2)

## 2018-08-31 LAB — MAGNESIUM: Magnesium: 1.7 mg/dL (ref 1.7–2.4)

## 2018-08-31 MED ORDER — CARVEDILOL 6.25 MG PO TABS: 6.2500 mg | ORAL_TABLET | Freq: Two times a day (BID) | ORAL | 0 refills | Status: DC

## 2018-08-31 MED ORDER — INSULIN ASPART PROT & ASPART (70-30 MIX) 100 UNIT/ML ~~LOC~~ SUSP: 12.0000 [IU] | Freq: Two times a day (BID) | SUBCUTANEOUS | 11 refills | Status: DC

## 2018-08-31 MED ORDER — FUROSEMIDE 20 MG PO TABS: 40.0000 mg | ORAL_TABLET | Freq: Two times a day (BID) | ORAL | 3 refills | Status: DC

## 2018-08-31 MED FILL — CARVEDILOL 6.25 MG TABLET: 6.25 | 30 days supply | Qty: 60 | Fill #0

## 2018-08-31 MED FILL — FUROSEMIDE 20 MG TAB: 20 | 30 days supply | Qty: 120 | Fill #0

## 2018-08-31 MED FILL — ULTICARE INS 0.3 ML 30GX1/2: 30G X 1/2" | 30 days supply | Qty: 60 | Fill #0

## 2018-08-31 MED FILL — NOVOLOG MIX 70/30 VIAL: (70-30) 100 | 34 days supply | Qty: 10 | Fill #0

## 2018-08-31 NOTE — Progress Notes (Addendum)
[facility] Progress Note    Assessment/ Plan:   85 y.o.femaleDM,afib on Eliquis,possibly diastolic congestive heart failurehere w/ worseningof renal function over the last few months from the high ones now to the threes. Patient actually saw Dr. Hollie Salk as an outpatient on 08/17/2018. She had concerns of possible pulmonary renal syndrome but work-up was not remarkable for that. Patient is known to have a solitary kidney. When her creatinine started to go up her ARB was discontinued but that did not stop the progression.Here w/worseningvolume overloadw/ Cr of 3.2 worsened to 3.68.  1.Renal-acute on chronic renal insufficiency versus just progressive renal insufficiency an 85 year old lady with a solitary kidney. She has had a significant work-up that appears negative for kidney specific disease. She did have granular casts which begs a question if her blood pressure has been too low. Right now, volume overloaded needs high-dose Lasix.If Lasix is not successful or leads to progressive uremia then we have a more serious conversation about where this could be headed.  -2.6 L during this hospitalization  -Dialysis has been mentioned to the patient andthedaughter; I spoke with the daughter again 6/9 and we did a conference call w/ the pt and she would like dialysis if necessary.There are noabsoluteindications for dialysisand fortunately her renal function finally has stabilized.  - Bec of the CHF it may be difficult to manage her advanced CKD without RRT.   - Transition to orals (home regimen was 20mg  QOD) but likely will need twice a day dosing. Giving a trial of Lasix 40mg  BID which is reasonable.  - F/u w/ Dr. Hollie Salk in 2-4 weeks on d/c. She had an appt 6/10 but that was d/c'd.  Will need to make a decision about dialysis (she wants to discuss w/ her daughter @home ) or in the future she will end up initiating HD through a catheter.   Will sign off  at this time; please reconsult as needed.  - Urine culture w/ multiple organisms and may represent colonization or contamination.  2. Hypertension/volume-volume overload with symptoms.  discontinued hydralazine and decrease carvedilol so that blood pressure does not get too low 3. Anemia -not a major issue right now, hemoglobin close to 10.She is iron deficient, will go ahead and dose with Feraheme 4.Hypokalemia-anticipated will lower with diuresis, will need to be careful w/ replacement upon d/c as she will be on lower dose of diuretics  Subjective:   Slept well overnight and ambulating. Dyspnea improved. No f/c/n/v. UOP picking up per pt.   Objective:   BP 128/72 (BP Location: Right Arm)   Pulse 85   Temp 98 F (36.7 C) (Oral)   Resp 17   Ht 5\' 3"  (1.6 m)   Wt 91.4 kg   SpO2 96%   BMI 35.69 kg/m   Intake/Output Summary (Last 24 hours) at 08/31/2018 0908 Last data filed at 08/31/2018 0840 Gross per 24 hour  Intake 1560 ml  Output 2100 ml  Net -540 ml   Weight change: -1.7 kg  Physical Exam: General:elderly WF  HEENT:EOMI Neck:positive for JVD Heart:RRR Lungs:dec BS Abdomen:obese, soft Extremities:pitting edemabut improving Skin:warm and dry Neuro:alert- Seems non focal  Imaging: No results found.  Labs: BMET Recent Labs  Lab 08/25/18 1840 08/26/18 0516 08/27/18 0538 08/28/18 0537 08/29/18 0354 08/30/18 0627 08/31/18 0549  NA 141 144 142 138 138 138 140  K 3.7 3.6 3.7 3.9 4.1 3.5 3.8  CL 110 111 108 106 105 102 102  CO2 21* 24 24 22 22  25  26  GLUCOSE 182* 149* 142* 153* 154* 127* 121*  BUN 56* 55* 60* 68* 77* 82* 87*  CREATININE 3.20* 3.28* 3.68* 4.10* 4.29* 3.79* 3.80*  CALCIUM 8.9 8.8* 8.4* 8.2* 8.0* 8.0* 8.0*   CBC Recent Labs  Lab 08/25/18 1840 08/26/18 0655 08/30/18 0627 08/31/18 0549  WBC 7.3 5.9 7.4 7.9  HGB 10.8* 10.1* 10.0* 10.2*  HCT 34.1* 31.6* 31.0* 31.3*  MCV 95.3 95.5 94.2 93.7  PLT 123* 127* 130* 141*     Medications:    . apixaban  2.5 mg Oral BID  . carvedilol  6.25 mg Oral BID WC  . diltiazem  180 mg Oral Daily  . furosemide  40 mg Oral BID  . insulin aspart  0-5 Units Subcutaneous QHS  . insulin aspart  0-9 Units Subcutaneous TID WC  . insulin detemir  9 Units Subcutaneous QHS  . potassium chloride  20 mEq Oral Daily  . pravastatin  20 mg Oral q1800  . sodium chloride flush  3 mL Intravenous Q12H      Otelia Santee, MD 08/31/2018, 9:08 AM

## 2018-08-31 NOTE — Plan of Care (Signed)
  Problem: Education: Goal: Knowledge of General Education information will improve Description: Including pain rating scale, medication(s)/side effects and non-pharmacologic comfort measures Outcome: Adequate for Discharge   Problem: Health Behavior/Discharge Planning: Goal: Ability to manage health-related needs will improve Outcome: Adequate for Discharge   Problem: Clinical Measurements: Goal: Respiratory complications will improve Outcome: Adequate for Discharge     

## 2018-08-31 NOTE — Evaluation (Addendum)
Physical Therapy Evaluation Patient Details Name: Miranda Garcia MRN: [id number] DOB: 25-Oct-1930 Today's Date: 08/31/2018   History of Present Illness  85 yo admitted with SOB, orthopnea, CHF exacerbation. PMhx: CHF, DM, Afib, renal insufficiency, HTN  Clinical Impression  Pt very pleasant and eager to return home. 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 Pt with good stability with all activity but fatigued with limited hall ambulation and would benefit from acute therapy to maximize activity tolerance and function to decrease burden of care. Pt given HF education booklet and discussed importance of walking program as well as diet, pt appreciative. Recommend daily ambulation with nursing staff.   HR 89-102 Spo2 90-94% on RA with gait     Follow Up Recommendations No PT follow up    Equipment Recommendations  None recommended by PT    Recommendations for Other Services       Precautions / Restrictions Precautions Precautions: Fall      Mobility  Bed Mobility Overal bed mobility: Modified Independent             General bed mobility comments: HOb 20 degrees with rail, pt reports hospital bed ordered for home due to orthopnea  Transfers Overall transfer level: Modified independent                  Ambulation/Gait Ambulation/Gait assistance: Supervision Gait Distance (Feet): 150 Feet Assistive device: None Gait Pattern/deviations: Step-through pattern;Decreased stride length   Gait velocity interpretation: >2.62 ft/sec, indicative of community ambulatory General Gait Details: pt with slow steady gait with SpO2 90-94% on RA  Stairs Stairs: Yes Stairs assistance: Modified independent (Device/Increase time) Stair Management: Alternating pattern;One rail Right;Forwards Number of Stairs: 5 General stair comments: pt  performed stairs safely with use of rail for stability  Wheelchair Mobility    Modified Rankin (Stroke Patients Only)       Balance Overall balance assessment: Mild deficits observed, not formally tested                                           Pertinent Vitals/Pain Pain Assessment: No/denies pain    Home Living Family/patient expects to be discharged to:: Private residence Living Arrangements: Children Available Help at Discharge: Family;Available PRN/intermittently Type of Home: House Home Access: Stairs to enter Entrance Stairs-Rails: Right Entrance Stairs-Number of Steps: 5 Home Layout: One level Home Equipment: Walker - 2 wheels;Cane - single point;Bedside commode;Shower seat Additional Comments: does not use any available DME    Prior Function Level of Independence: Independent         Comments: daughter assists with housework and cooking for the last 6 months     Hand Dominance        Extremity/Trunk Assessment   Upper Extremity Assessment Upper Extremity Assessment: Generalized weakness    Lower Extremity Assessment Lower Extremity Assessment: Generalized weakness    Cervical / Trunk Assessment Cervical / Trunk Assessment: Kyphotic  Communication   Communication: No difficulties  Cognition Arousal/Alertness: Awake/alert Behavior During Therapy: WFL for tasks assessed/performed Overall Cognitive Status: Within Functional Limits for tasks assessed  General Comments      Exercises     Assessment/Plan    PT Assessment Patient needs continued PT services  PT Problem List Decreased mobility;Decreased activity tolerance;Decreased strength       PT Treatment Interventions Gait training;Therapeutic exercise;Patient/family education;Balance training;Functional mobility training;Therapeutic activities    PT Goals (Current goals can be found in the Care Plan section)   Acute Rehab PT Goals Patient Stated Goal: return to baking and alterations PT Goal Formulation: With patient Time For Goal Achievement: 09/14/18 Potential to Achieve Goals: Good    Frequency Min 3X/week   Barriers to discharge Decreased caregiver support      Co-evaluation               AM-PAC PT "6 Clicks" Mobility  Outcome Measure Help needed turning from your back to your side while in a flat bed without using bedrails?: A Little Help needed moving from lying on your back to sitting on the side of a flat bed without using bedrails?: A Little Help needed moving to and from a bed to a chair (including a wheelchair)?: None Help needed standing up from a chair using your arms (e.g., wheelchair or bedside chair)?: None Help needed to walk in hospital room?: None Help needed climbing 3-5 steps with a railing? : None 6 Click Score: 22    End of Session   Activity Tolerance: Patient tolerated treatment well Patient left: in chair;with call bell/phone within reach Nurse Communication: Mobility status PT Visit Diagnosis: Other abnormalities of gait and mobility (R26.89);Muscle weakness (generalized) (M62.81)    Time: 1008-1030 PT Time Calculation (min) (ACUTE ONLY): 22 min   Charges:   PT Evaluation $PT Eval Moderate Complexity: 1 Mod          [place], PT Acute Rehabilitation Services Pager: [phone] Office: [phone]   Sandy Salaam Dakiyah Heinke 08/31/2018, 12:42 PM

## 2018-08-31 NOTE — Discharge Summary (Addendum)
Physician Discharge Summary  SAFA DERNER DGL:[id number] DOB: [birth year] DOA: 08/25/2018  PCP: Glenda Chroman, MD  Admit date: 08/25/2018 Discharge date: 08/31/2018 Consultations: Nephrology Dr. Moshe Cipro Admitted From: Home Disposition: Home  Discharge Diagnoses:  Principal Problem:   Acute on chronic diastolic CHF (congestive heart failure) ([place]) Active Problems:   Atrial fibrillation (HCC)   CKD (chronic kidney disease), stage IV ([place])   Anemia due to chronic illness   Insulin-requiring or dependent type II diabetes mellitus ([place])   Brief/Interim Summary: 85 y.o.femalewith medical history significant foratrial fibrillation on Eliquis, insulin-dependent diabetes mellitus, chronic diastolic CHF, and renal insufficiency followed by nephrology, presented to the emergency department on June 5 for evaluation of shortness of breath and orthopnea.  She reported using 6 pillows and unable to sleep at night.  She was directed by her primary nephrologist to come to the hospital. Patient had developed worsening of renal function over the last few months from the high ones now to the threes.  Patient actually saw Dr. Hollie Salk as an outpatient on 08/17/2018.  She had concerns of possible pulmonary renal syndrome but work-up was not remarkable for that.  Patient is known to have a solitary kidney.  When her creatinine started to go up her ARB was discontinued but that did not stop the progression. Upon arrival to the ED, patient is found to be afebrile, saturating 89% on room air, tachypneic, and with stable blood pressure and heart rate.  Chest x-ray features small bilateral pleural effusions and mild bibasilar atelectasis.  Chemistry panel is notable for a creatinine of 3.20, up from 2.64 a month ago.  CBC is notable for hemoglobin of 10.8 and platelets 123,000.  Troponin is undetectable and BNP is elevated to 467.  COVID-19 screening test was negative.  Patient was given 40 mg IV Lasix in the ED for CHF  exacerbation and the hospitalists were asked to admit.  1.  Acute on chronic diastolic CHF: Patient responded well to IV diuresis during the hospital course and transition to oral Lasix on June 10.  Patient weaned off O2 and saturating well on room air.  Her leg swellings have resolved.  She reports diuresing well.  She has been receiving Lasix 40 mg twice daily which nephrology feels is reasonable for patient to be discharged on.  Patient however reports 1 pound weight loss overnight and quite a bit of diuresis.  Patient advised to monitor input/output and daily weights as outpatient and titrate Lasix to once daily if she notes rapid weight drop or significant diuresis or dizziness.  She has also been referred to heart failure clinic (she follows Dr. Harl Bowie with [facility] cardiology as outpatient).  2.  Acute on chronic renal failure: Patient has solitary kidney and baseline renal insufficiency.  Patient evaluated and followed by nephrology while admitted here.  She apparently did have granular casts on urine analysis as outpatient.  Her serum creatinine did peak to 4.29 during the hospital course and stable at 3.8 prior to discharge.  Nephrology feels this could be progression of chronic kidney disease versus cardiorenal syndrome in the setting of diuresis.  She is advised follow-up labs through PCP in 4 to 5 days to ensure stable renal function and adjustment of diuretics per fluid status and renal function.  Patient received potassium supplementation while on IV diuretics.  Given low GFR ,discussions regarding possible RRT as outpatient and nephrology recommendations for cautious potassium use, will defer potassium supplementation upon discharge and reevaluate need for this upon  repeat BMP in couple of days.  3.  Hypertension: Coreg dosage has been reduced and hydralazine discontinued to allow blood pressure tolerance for diuretics.  Patient aware of this prior to discharge.  4.  Anemia of chronic disease:  Follow-up nephrology as outpatient.  Stable at 10 and no signs of bleeding.  5.  Insulin-dependent diabetes mellitus: Patient's blood glucose stable on 9 units of Lantus insulin here.  She takes 70/30 insulin at 18 units twice daily at home.  She however reports hypoglycemic episodes with this regimen, likely related to poor insulin clearance with progressive renal dysfunction.  Patient advised to reduce 70/30 insulin dosage to 10 units twice daily and titrate up to 12 units if persistent blood glucose greater than 150.  6.  Chronic atrial fibrillation: Coreg dose reduced to allow blood pressure tolerance for diuretics.  Watch for tachycardia.  Resume anticoagulation.  7. Class II obesity-Body mass index is 36.36 kg/m.Low calorie diet, portion control and increase physical activity discussed with patient.   Discharge Exam: Vitals:   08/30/18 2038 08/31/18 0604  BP: (!) 133/50 128/72  Pulse: 86 85  Resp: 18 17  Temp: 98.3 F (36.8 C) 98 F (36.7 C)  SpO2: 93% 96%   Vitals:   08/30/18 1632 08/30/18 2038 08/31/18 0604 08/31/18 0611  BP: (!) 142/56 (!) 133/50 128/72   Pulse:  86 85   Resp:  18 17   Temp:  98.3 F (36.8 C) 98 F (36.7 C)   TempSrc:  Oral Oral   SpO2: 93% 93% 96%   Weight:    91.4 kg  Height:        General: Pt is alert, awake, not in acute distress Cardiovascular: RRR, S1/S2 +, no rubs, no gallops Respiratory: CTA bilaterally, no wheezing, no rhonchi Abdominal: Soft, NT, ND, bowel sounds + Extremities: no edema, no cyanosis  Discharge Instructions  Discharge Instructions    (HEART FAILURE PATIENTS) Call MD:  Anytime you have any of the following symptoms: 1) 3 pound weight gain in 24 hours or 5 pounds in 1 week 2) shortness of breath, with or without a dry hacking cough 3) swelling in the hands, feet or stomach 4) if you have to sleep on extra pillows at night in order to breathe.   Complete by: As directed    AMB referral to CHF clinic   Complete by: As  directed    Call MD for:  difficulty breathing, headache or visual disturbances   Complete by: As directed    Diet - low sodium heart healthy   Complete by: As directed    Discharge instructions   Complete by: As directed    Daily weights, fluid restriction   Increase activity slowly   Complete by: As directed      Allergies as of 08/31/2018      Reactions   Atorvastatin Other (See Comments)   Leg pains   Contrast Media [iodinated Diagnostic Agents]    Pt has 1 full functioning kidney   Penicillins Nausea And Vomiting, Rash   Did it involve swelling of the face/tongue/throat, SOB, or low BP? No Did it involve sudden or severe rash/hives, skin peeling, or any reaction on the inside of your mouth or nose? Yes Did you need to seek medical attention at a hospital or doctor's office? Yes When did it last happen?65 yrs ago If all above answers are "NO", may proceed with cephalosporin use.      Medication List    STOP  taking these medications   hydrALAZINE 50 MG tablet Commonly known as: APRESOLINE     TAKE these medications   apixaban 2.5 MG Tabs tablet Commonly known as: Eliquis Take 1 tablet (2.5 mg total) by mouth 2 (two) times daily.   calcium citrate-vitamin D 315-200 MG-UNIT tablet Commonly known as: CITRACAL+D Take 1 tablet by mouth daily.   carvedilol 6.25 MG tablet Commonly known as: COREG Take 1 tablet (6.25 mg total) by mouth 2 (two) times daily with a meal for 30 days. What changed:   medication strength  how much to take   cholecalciferol 25 MCG (1000 UT) tablet Commonly known as: VITAMIN D3 Take 1,000 Units by mouth daily.   diltiazem 180 MG 24 hr capsule Commonly known as: CARDIZEM CD TAKE ONE (1) CAPSULE EACH DAY What changed: See the new instructions.   furosemide 20 MG tablet Commonly known as: LASIX Take 2 tablets (40 mg total) by mouth 2 (two) times daily. What changed:   how much to take  when to take this   insulin aspart  protamine- aspart (70-30) 100 UNIT/ML injection Commonly known as: NovoLOG Mix 70/30 Inject 0.12 mLs (12 Units total) into the skin 2 (two) times a day. What changed:   medication strength  how much to take   pravastatin 20 MG tablet Commonly known as: PRAVACHOL Take 1 tablet (20 mg total) by mouth every evening.            Durable Medical Equipment  (From admission, onward)         Start     Ordered   08/27/18 1012  For home use only DME Hospital bed  Once    Question Answer Comment  Length of Need Lifetime   Patient has (list medical condition): orthopnea, COPD, CHF   The above medical condition requires: Patient requires the ability to reposition frequently   Head must be elevated greater than: 30 degrees   Bed type Semi-electric   Support Surface: Gel Overlay      08/27/18 1012          Allergies  Allergen Reactions  . Atorvastatin Other (See Comments)    Leg pains  . Contrast Media [Iodinated Diagnostic Agents]     Pt has 1 full functioning kidney  . Penicillins Nausea And Vomiting and Rash    Did it involve swelling of the face/tongue/throat, SOB, or low BP? No Did it involve sudden or severe rash/hives, skin peeling, or any reaction on the inside of your mouth or nose? Yes Did you need to seek medical attention at a hospital or doctor's office? Yes When did it last happen?65 yrs ago If all above answers are "NO", may proceed with cephalosporin use.        Discharge Condition: Stable CODE STATUS: Full code Diet recommendation: 2 g sodium, low-fat diabetic diet Recommendations for Outpatient Follow-up:  1. Follow up with PCP in 5 to 7 days 2. Please obtain BMP/CBC within one week      The results of significant diagnostics from this hospitalization (including imaging, microbiology, ancillary and laboratory) are listed below for reference.     Microbiology: Recent Results (from the past 240 hour(s))  SARS Coronavirus 2 (CEPHEID -  Performed in [place] hospital lab), Hosp Order     Status: None   Collection Time: 08/25/18  6:49 PM   Specimen: Nasopharyngeal Swab  Result Value Ref Range Status   SARS Coronavirus 2 NEGATIVE NEGATIVE Final    Comment: (NOTE)  If result is NEGATIVE SARS-CoV-2 target nucleic acids are NOT DETECTED. The SARS-CoV-2 RNA is generally detectable in upper and lower  respiratory specimens during the acute phase of infection. The lowest  concentration of SARS-CoV-2 viral copies this assay can detect is 250  copies / mL. A negative result does not preclude SARS-CoV-2 infection  and should not be used as the sole basis for treatment or other  patient management decisions.  A negative result may occur with  improper specimen collection / handling, submission of specimen other  than nasopharyngeal swab, presence of viral mutation(s) within the  areas targeted by this assay, and inadequate number of viral copies  (<250 copies / mL). A negative result must be combined with clinical  observations, patient history, and epidemiological information. If result is POSITIVE SARS-CoV-2 target nucleic acids are DETECTED. The SARS-CoV-2 RNA is generally detectable in upper and lower  respiratory specimens dur ing the acute phase of infection.  Positive  results are indicative of active infection with SARS-CoV-2.  Clinical  correlation with patient history and other diagnostic information is  necessary to determine patient infection status.  Positive results do  not rule out bacterial infection or co-infection with other viruses. If result is PRESUMPTIVE POSTIVE SARS-CoV-2 nucleic acids MAY BE PRESENT.   A presumptive positive result was obtained on the submitted specimen  and confirmed on repeat testing.  While 2019 novel coronavirus  (SARS-CoV-2) nucleic acids may be present in the submitted sample  additional confirmatory testing may be necessary for epidemiological  and / or clinical management  purposes  to differentiate between  SARS-CoV-2 and other Sarbecovirus currently known to infect humans.  If clinically indicated additional testing with an alternate test  methodology [phone]) is advised. The SARS-CoV-2 RNA is generally  detectable in upper and lower respiratory sp ecimens during the acute  phase of infection. The expected result is Negative. Fact Sheet for Patients:  StrictlyIdeas.no Fact Sheet for Healthcare Providers: BankingDealers.co.za This test is not yet approved or cleared by the [place] FDA and has been authorized for detection and/or diagnosis of SARS-CoV-2 by FDA under an Emergency Use Authorization (EUA).  This EUA will remain in effect (meaning this test can be used) for the duration of the COVID-19 declaration under Section 564(b)(1) of the Act, 21 U.S.C. section 360bbb-3(b)(1), unless the authorization is terminated or revoked sooner. Performed at [facility], [place] [address] [place], [place] [address]   Culture, Urine     Status: None   Collection Time: 08/28/18 10:50 PM   Specimen: Urine, Clean Catch  Result Value Ref Range Status   Specimen Description URINE, CLEAN CATCH  Final   Special Requests   Final    STERILE CONTAINER Performed at [facility], [address], [place], [place] [address]    Culture   Final    Multiple bacterial morphotypes present, none predominant. Suggest appropriate recollection if clinically indicated.   Report Status 08/30/2018 FINAL  Final     Labs: BNP (last 3 results) Recent Labs    05/29/18 1217 07/28/18 1915 08/25/18 1840  BNP 488.0* 241.0* 127.5*   Basic Metabolic Panel: Recent Labs  Lab 08/27/18 0538 08/28/18 0537 08/29/18 0354 08/30/18 0627 08/31/18 0549  NA 142 138 138 138 140  K 3.7 3.9 4.1 3.5 3.8  CL 108 106 105 102 102  CO2 24 22 22 25 26   GLUCOSE 142* 153* 154* 127* 121*  BUN 60* 68* 77* 82* 87*  CREATININE  3.68*  4.10* 4.29* 3.79* 3.80*  CALCIUM 8.4* 8.2* 8.0* 8.0* 8.0*  MG  --   --   --   --  1.7   Liver Function Tests: Recent Labs  Lab 08/25/18 1840  AST 18  ALT 13  ALKPHOS 56  BILITOT 1.2  PROT 6.4*  ALBUMIN 3.3*   No results for input(s): LIPASE, AMYLASE in the last 168 hours. No results for input(s): AMMONIA in the last 168 hours. CBC: Recent Labs  Lab 08/25/18 1840 08/26/18 0655 08/30/18 0627 08/31/18 0549  WBC 7.3 5.9 7.4 7.9  HGB 10.8* 10.1* 10.0* 10.2*  HCT 34.1* 31.6* 31.0* 31.3*  MCV 95.3 95.5 94.2 93.7  PLT 123* 127* 130* 141*   Cardiac Enzymes: Recent Labs  Lab 08/25/18 1840  TROPONINI <0.03   BNP: Invalid input(s): POCBNP CBG: Recent Labs  Lab 08/30/18 1152 08/30/18 1613 08/30/18 2141 08/31/18 0651 08/31/18 1125  GLUCAP 145* 150* 178* 114* 223*   D-Dimer No results for input(s): DDIMER in the last 72 hours. Hgb A1c No results for input(s): HGBA1C in the last 72 hours. Lipid Profile No results for input(s): CHOL, HDL, LDLCALC, TRIG, CHOLHDL, LDLDIRECT in the last 72 hours. Thyroid function studies No results for input(s): TSH, T4TOTAL, T3FREE, THYROIDAB in the last 72 hours.  Invalid input(s): FREET3 Anemia work up No results for input(s): VITAMINB12, FOLATE, FERRITIN, TIBC, IRON, RETICCTPCT in the last 72 hours. Urinalysis    Component Value Date/Time   COLORURINE STRAW (A) 08/28/2018 2246   APPEARANCEUR CLEAR 08/28/2018 2246   LABSPEC 1.006 08/28/2018 2246   PHURINE 5.0 08/28/2018 2246   GLUCOSEU NEGATIVE 08/28/2018 2246   HGBUR NEGATIVE 08/28/2018 2246   BILIRUBINUR NEGATIVE 08/28/2018 2246   KETONESUR NEGATIVE 08/28/2018 2246   PROTEINUR NEGATIVE 08/28/2018 2246   NITRITE NEGATIVE 08/28/2018 2246   LEUKOCYTESUR MODERATE (A) 08/28/2018 2246   Sepsis Labs Invalid input(s): PROCALCITONIN,  WBC,  LACTICIDVEN Microbiology Recent Results (from the past 240 hour(s))  SARS Coronavirus 2 (CEPHEID - Performed in [place] hospital lab),  Hosp Order     Status: None   Collection Time: 08/25/18  6:49 PM   Specimen: Nasopharyngeal Swab  Result Value Ref Range Status   SARS Coronavirus 2 NEGATIVE NEGATIVE Final    Comment: (NOTE) If result is NEGATIVE SARS-CoV-2 target nucleic acids are NOT DETECTED. The SARS-CoV-2 RNA is generally detectable in upper and lower  respiratory specimens during the acute phase of infection. The lowest  concentration of SARS-CoV-2 viral copies this assay can detect is 250  copies / mL. A negative result does not preclude SARS-CoV-2 infection  and should not be used as the sole basis for treatment or other  patient management decisions.  A negative result may occur with  improper specimen collection / handling, submission of specimen other  than nasopharyngeal swab, presence of viral mutation(s) within the  areas targeted by this assay, and inadequate number of viral copies  (<250 copies / mL). A negative result must be combined with clinical  observations, patient history, and epidemiological information. If result is POSITIVE SARS-CoV-2 target nucleic acids are DETECTED. The SARS-CoV-2 RNA is generally detectable in upper and lower  respiratory specimens dur ing the acute phase of infection.  Positive  results are indicative of active infection with SARS-CoV-2.  Clinical  correlation with patient history and other diagnostic information is  necessary to determine patient infection status.  Positive results do  not rule out bacterial infection or co-infection with other viruses. If  result is PRESUMPTIVE POSTIVE SARS-CoV-2 nucleic acids MAY BE PRESENT.   A presumptive positive result was obtained on the submitted specimen  and confirmed on repeat testing.  While 2019 novel coronavirus  (SARS-CoV-2) nucleic acids may be present in the submitted sample  additional confirmatory testing may be necessary for epidemiological  and / or clinical management purposes  to differentiate between   SARS-CoV-2 and other Sarbecovirus currently known to infect humans.  If clinically indicated additional testing with an alternate test  methodology [phone]) is advised. The SARS-CoV-2 RNA is generally  detectable in upper and lower respiratory sp ecimens during the acute  phase of infection. The expected result is Negative. Fact Sheet for Patients:  StrictlyIdeas.no Fact Sheet for Healthcare Providers: BankingDealers.co.za This test is not yet approved or cleared by the [place] FDA and has been authorized for detection and/or diagnosis of SARS-CoV-2 by FDA under an Emergency Use Authorization (EUA).  This EUA will remain in effect (meaning this test can be used) for the duration of the COVID-19 declaration under Section 564(b)(1) of the Act, 21 U.S.C. section 360bbb-3(b)(1), unless the authorization is terminated or revoked sooner. Performed at [facility], [place] [address] [place], [place] [address]   Culture, Urine     Status: None   Collection Time: 08/28/18 10:50 PM   Specimen: Urine, Clean Catch  Result Value Ref Range Status   Specimen Description URINE, CLEAN CATCH  Final   Special Requests   Final    STERILE CONTAINER Performed at [facility], [address] [place], [place] [address]    Culture   Final    Multiple bacterial morphotypes present, none predominant. Suggest appropriate recollection if clinically indicated.   Report Status 08/30/2018 FINAL  Final    Procedures/Studies: Dg Chest 2 View  Result Date: 08/23/2018 CLINICAL DATA:  Acute shortness of breath EXAM: CHEST - 2 VIEW COMPARISON:  07/28/2018 FINDINGS: Cardiac shadow is mildly enlarged but stable. Aortic calcifications are seen. Small bilateral pleural effusions are noted. No focal infiltrate is seen. Degenerative changes of the thoracic spine are noted. IMPRESSION: Small bilateral pleural effusions. No other focal abnormality is noted.  Electronically Signed   By: Inez Catalina M.D.   On: 08/23/2018 15:34   US Renal  Result Date: 08/16/2018 CLINICAL DATA:  CKD stage 4 EXAM: RENAL / URINARY TRACT ULTRASOUND COMPLETE COMPARISON:  No recent relevant prior available for comparison. FINDINGS: Right Kidney: Renal measurements: 10.2 x 4.6 x 5.5 cm = volume: 134 mL. There is a complex 7 mm nodule in the upper pole the right kidney. The echogenicity is normal. Left Kidney: Renal measurements: 7.4 x 3.1 x 3.5 cm = volume: 41 mL. The echogenicity is normal. There is some cortical thinning. There is a slightly complex 1 cm nodule Arising from the interpolar region. Bladder: The bladder was not definitely visualized. IMPRESSION: 1. No acute sonographic abnormality detected. There is no hydronephrosis. 2. Slightly complex bilateral renal nodules favored to represent proteinaceous cysts. A 6 month follow-up ultrasound can be performed to confirm stability. 3. Bladder not visualized. 4. Cortical thinning of the left kidney which can be seen in patients with medical renal disease. Electronically Signed   By: Constance Holster M.D.   On: 08/16/2018 20:59   Dg Chest Port 1 View  Result Date: 08/25/2018 CLINICAL DATA:  Shortness of breath.  History of CHF. EXAM: PORTABLE CHEST 1 VIEW COMPARISON:  Chest x-ray dated August 23, 2018. FINDINGS: Stable mild cardiomegaly. Normal  mediastinal contours. Atherosclerotic calcification of the aortic arch. Normal pulmonary vascularity. Unchanged small bilateral pleural effusions and mild bibasilar atelectasis. No pneumothorax. No acute osseous abnormality. IMPRESSION: 1. Unchanged small bilateral pleural effusions and mild bibasilar atelectasis. Electronically Signed   By: Titus Dubin M.D.   On: 08/25/2018 18:23    (Echo, Carotid, EGD, Colonoscopy, ERCP)  Time coordinating discharge: Over 30 minutes  SIGNED:   Guilford Shi, MD  [facility] 08/31/2018, 3:17 PM Pager   If 7PM-7AM, please contact  night-coverage [email or web address] Password [email or web address]

## 2018-09-02 DIAGNOSIS — I5033 Acute on chronic diastolic (congestive) heart failure: Secondary | ICD-10-CM | POA: Diagnosis not present

## 2018-09-04 DIAGNOSIS — Z6835 Body mass index (BMI) 35.0-35.9, adult: Secondary | ICD-10-CM | POA: Diagnosis not present

## 2018-09-04 DIAGNOSIS — Z09 Encounter for follow-up examination after completed treatment for conditions other than malignant neoplasm: Secondary | ICD-10-CM | POA: Diagnosis not present

## 2018-09-04 DIAGNOSIS — I509 Heart failure, unspecified: Secondary | ICD-10-CM | POA: Diagnosis not present

## 2018-09-04 DIAGNOSIS — Z299 Encounter for prophylactic measures, unspecified: Secondary | ICD-10-CM | POA: Diagnosis not present

## 2018-09-04 DIAGNOSIS — E1122 Type 2 diabetes mellitus with diabetic chronic kidney disease: Secondary | ICD-10-CM | POA: Diagnosis not present

## 2018-09-04 DIAGNOSIS — I4891 Unspecified atrial fibrillation: Secondary | ICD-10-CM | POA: Diagnosis not present

## 2018-09-04 DIAGNOSIS — I10 Essential (primary) hypertension: Secondary | ICD-10-CM | POA: Diagnosis not present

## 2018-09-06 ENCOUNTER — Ambulatory Visit (HOSPITAL_COMMUNITY): Payer: Medicare HMO

## 2018-09-08 ENCOUNTER — Telehealth: Payer: Self-pay | Admitting: Cardiology

## 2018-09-08 NOTE — Telephone Encounter (Signed)

## 2018-09-11 ENCOUNTER — Telehealth (INDEPENDENT_AMBULATORY_CARE_PROVIDER_SITE_OTHER): Payer: Medicare HMO | Admitting: Cardiology

## 2018-09-11 ENCOUNTER — Encounter: Payer: Self-pay | Admitting: Cardiology

## 2018-09-11 VITALS — BP 143/66 | HR 79 | Ht 63.0 in | Wt 188.0 lb

## 2018-09-11 DIAGNOSIS — I5032 Chronic diastolic (congestive) heart failure: Secondary | ICD-10-CM | POA: Diagnosis not present

## 2018-09-11 DIAGNOSIS — I10 Essential (primary) hypertension: Secondary | ICD-10-CM | POA: Diagnosis not present

## 2018-09-11 DIAGNOSIS — N184 Chronic kidney disease, stage 4 (severe): Secondary | ICD-10-CM

## 2018-09-11 NOTE — Progress Notes (Signed)
Virtual Visit via Telephone Note   This visit type was conducted due to national recommendations for restrictions regarding the COVID-19 Pandemic (e.g. social distancing) in an effort to limit this patient's exposure and mitigate transmission in our community.  Due to her co-morbid illnesses, this patient is at least at moderate risk for complications without adequate follow up.  This format is felt to be most appropriate for this patient at this time.  The patient did not have access to video technology/had technical difficulties with video requiring transitioning to audio format only (telephone).  All issues noted in this document were discussed and addressed.  No physical exam could be performed with this format.  Please refer to the patient's chart for her  consent to telehealth for [facility].   Date:  09/11/2018   ID:  Miranda Garcia, DOB Mar 11, 1931, MRN [id number]  Patient Location: Home Provider Location: Office  PCP:  Glenda Chroman, MD  Cardiologist:  Carlyle Dolly, MD  Electrophysiologist:  None   Evaluation Performed:  Follow-Up Visit  Chief Complaint:  Hospital follow up  History of Present Illness:    Miranda Garcia is a 85 y.o. female seen today for follow up of the following medical problems. This is a focused visit on her history of chronic diastolic HF, for more detailed history please refer to prior notes    1. Chronic diastolic HF - working to limit diuretic due to her renal dysfunction 04/2018 echo: LVEF 60-65%, normal RV, evidence of diastolic dysfunction    - uptrending Cr by labs, was on lasix 20mg  every other day. Changed to [facility] and Fridays - pcp lab 07/27/2018: Cr 2, BUN 44, GFR 22, K 5.1, NT proBNP 2994  - 07/28/2018 ER visit with SOB. Reported DOE and orthopnea. ER weight 205 lbs - BNP 241 (down from 488), Cr 2.64, BUN 51, WBC 6.1 Hgb 12.1 Plt 163 Trop neg - CXR cardiomegaly with vascular congestion, cardiomegaly - EKG afib rate 97   - she increased her lasix for a few days after ER visit. Symptoms have not improved.  - mild LE edema. Last lasix dose Tuesday - no wheezing, mild cough. Ongoing orthopnea, sometimes has to sit up.  - home weights 203 and stable.    - admitted 08/5679 with diasotlic HF. SOB and severe orthopnea.  - sats 89% on RA, Cr up to 3.20 - diuresed during admission, discharged on lasix 40mg  bid per nephrology recs.   - since discharge feeling much better. Orthopnea has resolve - taking lasix 40mg  bid. Some days she will take 40mg  daily.  - labs with pcp last week Cr 2.86. Had been above 4 during recent admission.  - home weights down 17 lbs admitted, weights stable 188 lbs    2. CKD IV - followed by nephorlogy  3. Dyspnea - extensive pulmonmary and cardiac workup over the last few months.  Jan 2020 PFTs suggestive of restriction, mildly redcued DLCO but corrects for alveolar ventilation  04/2018 echo: LVEF 60-65%, indeterminate diastolic function,reportedmoderate aortic stenosis - 05/2018 nuclear stress: no ischemia 05/2018 CT chest high resolution: findings suggesting of interstitial lung disease vs alveolar hemorrage.  05/2018 VQ scan: low probability PE,Patchy retention of the ventilation agent centrally may be seen with air trapping or COPD.     The patient does not have symptoms concerning for COVID-19 infection (fever, chills, cough, or new shortness of breath).    Past Medical History:  Diagnosis Date  . Arthritis   .  Atrial fibrillation ([place])   . CHF (congestive heart failure) ([place])   . Diabetes mellitus type II, controlled ([place])    x 15 yrs  . Elevated troponin   . Hemorrhoids   . Hypertension   . Renal insufficiency    Past Surgical History:  Procedure Laterality Date  . [place]   [facility]  . CATARACT EXTRACTION W/PHACO  01/11/2011   Procedure: CATARACT EXTRACTION PHACO AND INTRAOCULAR LENS PLACEMENT (IOC);  Surgeon: Tonny Jannae Fagerstrom;  Location: [facility] ORS;   Service: Ophthalmology;  Laterality: Right;  CDE 19.32  . CATARACT EXTRACTION W/PHACO  01/21/2011   Procedure: CATARACT EXTRACTION PHACO AND INTRAOCULAR LENS PLACEMENT (IOC);  Surgeon: Tonny Tanique Matney;  Location: [facility] ORS;  Service: Ophthalmology;  Laterality: Left;  CDE: 21.19  . COLONOSCOPY N/A 09/12/2013   Procedure: COLONOSCOPY;  Surgeon: Rogene Houston, MD;  Location: [facility] ENDO SUITE;  Service: Endoscopy;  Laterality: N/A;  125  . CYSTOSCOPY     [name]  . LITHOTRIPSY     [facility]  . PARTIAL HYSTERECTOMY       No outpatient medications have been marked as taking for the 09/11/18 encounter (Appointment) with Arnoldo Lenis, MD.     Allergies:   Atorvastatin, Contrast media [iodinated diagnostic agents], and Penicillins   Social History   Tobacco Use  . Smoking status: Never Smoker  . Smokeless tobacco: Never Used  Substance Use Topics  . Alcohol use: No    Alcohol/week: 0.0 standard drinks  . Drug use: No     Family Hx: The patient's family history includes Colon cancer in her brother.  ROS:   Please see the history of present illness.     All other systems reviewed and are negative.   Prior CV studies:   The following studies were reviewed today:   Labs/Other Tests and Data Reviewed:    EKG:  No ECG reviewed.  Recent Labs: 05/31/2018: Pro B Natriuretic peptide (BNP) 466.0 08/25/2018: ALT 13; B Natriuretic Peptide 466.9 08/31/2018: BUN 87; Creatinine, Ser 3.80; Hemoglobin 10.2; Magnesium 1.7; Platelets 141; Potassium 3.8; Sodium 140   Recent Lipid Panel Lab Results  Component Value Date/Time   CHOL 147 02/29/2012 04:27 AM   TRIG 140 02/29/2012 04:27 AM   HDL 45 02/29/2012 04:27 AM   CHOLHDL 3.3 02/29/2012 04:27 AM   LDLCALC 74 02/29/2012 04:27 AM    Wt Readings from Last 3 Encounters:  08/31/18 201 lb 8 oz (91.4 kg)  08/23/18 207 lb 12.8 oz (94.3 kg)  08/03/18 205 lb (93 kg)     Objective:    Vital Signs:   Today's Vitals   09/11/18 1602  BP: (!) 143/66   Pulse: 79  Weight: 188 lb (85.3 kg)  Height: 5\' 3"  (1.6 m)   Body mass index is 33.3 kg/m. Normal affect. Normal speech pattern and tone. Comfortable, no apparent distress. No audible signs of SOB or wheezing.  ASSESSMENT & PLAN:    1. Chronic diastolic HF - recent admission as reported above - since discharge continued downtrend in weights, down to 188 lbs. Cr has been improving as well - in setting of advanced kidney disease we have deferred diuretics to neprhology  2. CKD IV - f/u with neprhology next week - repeat Cr trending down from peak during recent admission   COVID-19 Education: The signs and symptoms of COVID-19 were discussed with the patient and how to seek care for testing (follow up with PCP or arrange E-visit).  The  importance of social distancing was discussed today.  Time:   Today, I have spent 14 minutes with the patient with telehealth technology discussing the above problems.     Medication Adjustments/Labs and Tests Ordered: Current medicines are reviewed at length with the patient today.  Concerns regarding medicines are outlined above.   Tests Ordered: No orders of the defined types were placed in this encounter.   Medication Changes: No orders of the defined types were placed in this encounter.   Follow Up:  Virtual Visit in 3 month(s)  Signed, Carlyle Dolly, MD  09/11/2018 2:51 PM    [place]

## 2018-09-11 NOTE — Patient Instructions (Signed)
Your physician recommends that you schedule a follow-up appointment in: 3 MONTHS WITH DR BRANCH  Your physician recommends that you continue on your current medications as directed. Please refer to the Current Medication list given to you today.  Thank you for choosing [facility]!!    

## 2018-09-15 ENCOUNTER — Other Ambulatory Visit: Payer: Self-pay | Admitting: Cardiology

## 2018-09-18 DIAGNOSIS — I480 Paroxysmal atrial fibrillation: Secondary | ICD-10-CM | POA: Diagnosis not present

## 2018-09-18 DIAGNOSIS — N2581 Secondary hyperparathyroidism of renal origin: Secondary | ICD-10-CM | POA: Diagnosis not present

## 2018-09-18 DIAGNOSIS — N189 Chronic kidney disease, unspecified: Secondary | ICD-10-CM | POA: Diagnosis not present

## 2018-09-18 DIAGNOSIS — D631 Anemia in chronic kidney disease: Secondary | ICD-10-CM | POA: Diagnosis not present

## 2018-09-18 DIAGNOSIS — N184 Chronic kidney disease, stage 4 (severe): Secondary | ICD-10-CM | POA: Diagnosis not present

## 2018-09-18 DIAGNOSIS — I5032 Chronic diastolic (congestive) heart failure: Secondary | ICD-10-CM | POA: Diagnosis not present

## 2018-09-18 DIAGNOSIS — E1122 Type 2 diabetes mellitus with diabetic chronic kidney disease: Secondary | ICD-10-CM | POA: Diagnosis not present

## 2018-09-18 DIAGNOSIS — N179 Acute kidney failure, unspecified: Secondary | ICD-10-CM | POA: Diagnosis not present

## 2018-09-18 DIAGNOSIS — I129 Hypertensive chronic kidney disease with stage 1 through stage 4 chronic kidney disease, or unspecified chronic kidney disease: Secondary | ICD-10-CM | POA: Diagnosis not present

## 2018-09-25 ENCOUNTER — Ambulatory Visit: Payer: Medicare HMO | Admitting: Pulmonary Disease

## 2018-10-02 DIAGNOSIS — Z7189 Other specified counseling: Secondary | ICD-10-CM | POA: Diagnosis not present

## 2018-10-02 DIAGNOSIS — I10 Essential (primary) hypertension: Secondary | ICD-10-CM | POA: Diagnosis not present

## 2018-10-02 DIAGNOSIS — Z299 Encounter for prophylactic measures, unspecified: Secondary | ICD-10-CM | POA: Diagnosis not present

## 2018-10-02 DIAGNOSIS — R5383 Other fatigue: Secondary | ICD-10-CM | POA: Diagnosis not present

## 2018-10-02 DIAGNOSIS — Z1331 Encounter for screening for depression: Secondary | ICD-10-CM | POA: Diagnosis not present

## 2018-10-02 DIAGNOSIS — Z0000 Encounter for general adult medical examination without abnormal findings: Secondary | ICD-10-CM | POA: Diagnosis not present

## 2018-10-02 DIAGNOSIS — Z79899 Other long term (current) drug therapy: Secondary | ICD-10-CM | POA: Diagnosis not present

## 2018-10-02 DIAGNOSIS — Z1211 Encounter for screening for malignant neoplasm of colon: Secondary | ICD-10-CM | POA: Diagnosis not present

## 2018-10-02 DIAGNOSIS — I5033 Acute on chronic diastolic (congestive) heart failure: Secondary | ICD-10-CM | POA: Diagnosis not present

## 2018-10-02 DIAGNOSIS — E7800 Pure hypercholesterolemia, unspecified: Secondary | ICD-10-CM | POA: Diagnosis not present

## 2018-10-02 DIAGNOSIS — Z1339 Encounter for screening examination for other mental health and behavioral disorders: Secondary | ICD-10-CM | POA: Diagnosis not present

## 2018-10-02 DIAGNOSIS — Z6835 Body mass index (BMI) 35.0-35.9, adult: Secondary | ICD-10-CM | POA: Diagnosis not present

## 2018-10-06 DIAGNOSIS — R69 Illness, unspecified: Secondary | ICD-10-CM | POA: Diagnosis not present

## 2018-10-18 DIAGNOSIS — I10 Essential (primary) hypertension: Secondary | ICD-10-CM | POA: Diagnosis not present

## 2018-10-18 DIAGNOSIS — I480 Paroxysmal atrial fibrillation: Secondary | ICD-10-CM | POA: Diagnosis not present

## 2018-10-18 DIAGNOSIS — N179 Acute kidney failure, unspecified: Secondary | ICD-10-CM | POA: Diagnosis not present

## 2018-10-18 DIAGNOSIS — I5032 Chronic diastolic (congestive) heart failure: Secondary | ICD-10-CM | POA: Diagnosis not present

## 2018-10-18 DIAGNOSIS — E1122 Type 2 diabetes mellitus with diabetic chronic kidney disease: Secondary | ICD-10-CM | POA: Diagnosis not present

## 2018-10-18 DIAGNOSIS — I129 Hypertensive chronic kidney disease with stage 1 through stage 4 chronic kidney disease, or unspecified chronic kidney disease: Secondary | ICD-10-CM | POA: Diagnosis not present

## 2018-10-18 DIAGNOSIS — N184 Chronic kidney disease, stage 4 (severe): Secondary | ICD-10-CM | POA: Diagnosis not present

## 2018-11-02 DIAGNOSIS — I5033 Acute on chronic diastolic (congestive) heart failure: Secondary | ICD-10-CM | POA: Diagnosis not present

## 2018-11-06 DIAGNOSIS — I10 Essential (primary) hypertension: Secondary | ICD-10-CM | POA: Diagnosis not present

## 2018-11-06 DIAGNOSIS — Z6834 Body mass index (BMI) 34.0-34.9, adult: Secondary | ICD-10-CM | POA: Diagnosis not present

## 2018-11-06 DIAGNOSIS — E1165 Type 2 diabetes mellitus with hyperglycemia: Secondary | ICD-10-CM | POA: Diagnosis not present

## 2018-11-06 DIAGNOSIS — I4891 Unspecified atrial fibrillation: Secondary | ICD-10-CM | POA: Diagnosis not present

## 2018-11-06 DIAGNOSIS — Z299 Encounter for prophylactic measures, unspecified: Secondary | ICD-10-CM | POA: Diagnosis not present

## 2018-11-06 DIAGNOSIS — E1122 Type 2 diabetes mellitus with diabetic chronic kidney disease: Secondary | ICD-10-CM | POA: Diagnosis not present

## 2018-11-06 DIAGNOSIS — N184 Chronic kidney disease, stage 4 (severe): Secondary | ICD-10-CM | POA: Diagnosis not present

## 2018-11-16 DIAGNOSIS — R69 Illness, unspecified: Secondary | ICD-10-CM | POA: Diagnosis not present

## 2018-11-20 DIAGNOSIS — I10 Essential (primary) hypertension: Secondary | ICD-10-CM | POA: Diagnosis not present

## 2018-11-22 ENCOUNTER — Other Ambulatory Visit: Payer: Self-pay | Admitting: Cardiology

## 2018-12-03 DIAGNOSIS — I5033 Acute on chronic diastolic (congestive) heart failure: Secondary | ICD-10-CM | POA: Diagnosis not present

## 2018-12-08 DIAGNOSIS — R69 Illness, unspecified: Secondary | ICD-10-CM | POA: Diagnosis not present

## 2018-12-13 ENCOUNTER — Encounter: Payer: Self-pay | Admitting: Cardiology

## 2018-12-13 ENCOUNTER — Ambulatory Visit: Payer: Medicare HMO | Admitting: Cardiology

## 2018-12-13 ENCOUNTER — Encounter: Payer: Self-pay | Admitting: *Deleted

## 2018-12-13 ENCOUNTER — Other Ambulatory Visit: Payer: Self-pay

## 2018-12-13 VITALS — BP 133/72 | HR 74 | Ht 63.0 in | Wt 187.2 lb

## 2018-12-13 DIAGNOSIS — I5032 Chronic diastolic (congestive) heart failure: Secondary | ICD-10-CM | POA: Diagnosis not present

## 2018-12-13 DIAGNOSIS — I4891 Unspecified atrial fibrillation: Secondary | ICD-10-CM

## 2018-12-13 DIAGNOSIS — I10 Essential (primary) hypertension: Secondary | ICD-10-CM | POA: Diagnosis not present

## 2018-12-13 DIAGNOSIS — N184 Chronic kidney disease, stage 4 (severe): Secondary | ICD-10-CM

## 2018-12-13 DIAGNOSIS — Z23 Encounter for immunization: Secondary | ICD-10-CM | POA: Diagnosis not present

## 2018-12-13 NOTE — Patient Instructions (Signed)

## 2018-12-13 NOTE — Progress Notes (Signed)
Clinical Summary Miranda Garcia is a 85 y.o.female  1. Chronic diastolic HF - working to limit diuretic due to her renal dysfunction 04/2018 echo: LVEF60-65%, normal RV, evidence of diastolic dysfunction   - admitted 03/3084 with diasotlic HF. SOB and severe orthopnea.  - sats 89% on RA, Cr up to 3.20 - diuresed during admission, discharged on lasix 40mg  bid per nephrology recs.   - since discharge feeling much better. Orthopnea has resolve - taking lasix 40mg  bid. Some days she will take 40mg  daily.  - labs with pcp last week Cr 2.86. Had been above 4 during recent admission.  - home weights down 17 lbs admitted, weights stable 188 lbs  - weight stable around 187 lbs. Home sclae 184 lbs. Earlier in the year she was above 200 lbs. Very aggressive with her low sodium diet, has really helped keep the fluid off.  - no recent edema - taking lasix 20mg  daily.    2. CKD IV - followed by nephorlogy Miranda Garcia, has f/u Oct 28  3. Dyspnea - extensive pulmonmary and cardiac workup over the last few months.  Jan 2020 PFTs suggestive of restriction, mildly redcued DLCO but corrects for alveolar ventilation  04/2018 echo: LVEF 60-65%, indeterminate diastolic function,reportedmoderate aortic stenosis - 05/2018 nuclear stress: no ischemia 05/2018 CT chest high resolution: findings suggesting of interstitial lung disease vs alveolar hemorrage.  05/2018 VQ scan: low probability PE,Patchy retention of the ventilation agent centrally may be seen with air trapping or COPD.  - symptoms resolved with significant diuresis.   4. HTN -compliant with meds   5. Afib - no recent symptoms - no recent bleeding on eliquis -   6. Aortic stenosis - mild to moderate by 04/2018 echo     Past Medical History:  Diagnosis Date  . Arthritis   . Atrial fibrillation ([place])   . CHF (congestive heart failure) ([place])   . Diabetes mellitus type II, controlled ([place])    x 15 yrs  .  Elevated troponin   . Hemorrhoids   . Hypertension   . Renal insufficiency      Allergies  Allergen Reactions  . Atorvastatin Other (See Comments)    Leg pains  . Contrast Media [Iodinated Diagnostic Agents]     Pt has 1 full functioning kidney  . Penicillins Nausea And Vomiting and Rash    Did it involve swelling of the face/tongue/throat, SOB, or low BP? No Did it involve sudden or severe rash/hives, skin peeling, or any reaction on the inside of your mouth or nose? Yes Did you need to seek medical attention at a Garcia or doctor's office? Yes When did it last happen?65 yrs ago If all above answers are "NO", may proceed with cephalosporin use.      Current Outpatient Medications  Medication Sig Dispense Refill  . calcium citrate-vitamin D (CITRACAL+D) 315-200 MG-UNIT per tablet Take 1 tablet by mouth daily.     . carvedilol (COREG) 6.25 MG tablet Take 1 tablet (6.25 mg total) by mouth 2 (two) times daily with a meal for 30 days. 60 tablet 0  . cholecalciferol (VITAMIN D3) 25 MCG (1000 UT) tablet Take 1,000 Units by mouth daily.    Marland Kitchen diltiazem (CARDIZEM CD) 180 MG 24 hr capsule TAKE ONE (1) CAPSULE EACH DAY 90 capsule 1  . ELIQUIS 2.5 MG TABS tablet TAKE ONE TABLET BY MOUTH TWICE DAILY 60 tablet 3  . furosemide (LASIX) 20 MG tablet Take 2 tablets (40 mg  total) by mouth 2 (two) times daily. 45 tablet 3  . insulin aspart protamine- aspart (NOVOLOG MIX 70/30) (70-30) 100 UNIT/ML injection Inject 0.12 mLs (12 Units total) into the skin 2 (two) times a day. 10 mL 11  . pravastatin (PRAVACHOL) 20 MG tablet Take 1 tablet (20 mg total) by mouth every evening. 90 tablet 3   No current facility-administered medications for this visit.    Facility-Administered Medications Ordered in Other Visits  Medication Dose Route Frequency Provider Last Rate Last Dose  . neomycin-polymyxin-dexameth (MAXITROL) 0.1 % ophth ointment    PRN Miranda Trebor Galdamez, MD   1 application at 99/83/38 1427      Past Surgical History:  Procedure Laterality Date  . Miranda Garcia   Miranda Garcia  . CATARACT EXTRACTION W/PHACO  01/11/2011   Procedure: CATARACT EXTRACTION PHACO AND INTRAOCULAR LENS PLACEMENT (IOC);  Surgeon: Miranda Garcia;  Location: Miranda Garcia;  Service: Ophthalmology;  Laterality: Right;  CDE 19.32  . CATARACT EXTRACTION W/PHACO  01/21/2011   Procedure: CATARACT EXTRACTION PHACO AND INTRAOCULAR LENS PLACEMENT (IOC);  Surgeon: Miranda Garcia;  Location: Miranda Garcia;  Service: Ophthalmology;  Laterality: Left;  CDE: 21.19  . COLONOSCOPY N/A 09/12/2013   Procedure: COLONOSCOPY;  Surgeon: Miranda Houston, MD;  Location: Miranda Garcia;  Service: Endoscopy;  Laterality: N/A;  125  . CYSTOSCOPY     Miranda Garcia  . LITHOTRIPSY     Miranda Garcia  . PARTIAL HYSTERECTOMY       Allergies  Allergen Reactions  . Atorvastatin Other (See Comments)    Leg pains  . Contrast Media [Iodinated Diagnostic Agents]     Pt has 1 full functioning kidney  . Penicillins Nausea And Vomiting and Rash    Did it involve swelling of the face/tongue/throat, SOB, or low BP? No Did it involve sudden or severe rash/hives, skin peeling, or any reaction on the inside of your mouth or nose? Yes Did you need to seek medical attention at a Garcia or doctor's office? Yes When did it last happen?65 yrs ago If all above answers are "NO", may proceed with cephalosporin use.       Family History  Problem Relation Age of Onset  . Colon cancer Brother      Social History [name] reports that she has never smoked. She has never used smokeless tobacco. [name] reports no history of alcohol use.   Review of Systems CONSTITUTIONAL: No weight loss, fever, chills, weakness or fatigue.  HEENT: Eyes: No visual loss, blurred vision, double vision or yellow sclerae.No hearing loss, sneezing, congestion, runny nose or sore throat.  SKIN: No rash or itching.  CARDIOVASCULAR: per hpi RESPIRATORY: No shortness of breath, cough or  sputum.  GASTROINTESTINAL: No anorexia, nausea, vomiting or diarrhea. No abdominal pain or blood.  GENITOURINARY: No burning on urination, no polyuria NEUROLOGICAL: No headache, dizziness, syncope, paralysis, ataxia, numbness or tingling in the extremities. No change in bowel or bladder control.  MUSCULOSKELETAL: No muscle, back pain, joint pain or stiffness.  LYMPHATICS: No enlarged nodes. No history of splenectomy.  PSYCHIATRIC: No history of depression or anxiety.  ENDOCRINOLOGIC: No reports of sweating, cold or heat intolerance. No polyuria or polydipsia.  Marland Kitchen   Physical Examination Today's Vitals   12/13/18 1312  BP: 133/72  Pulse: 74  SpO2: 99%  Weight: 187 lb 3.2 oz (84.9 kg)  Height: 5\' 3"  (1.6 m)   Body mass index is 33.16 kg/m.  Gen: resting comfortably, no acute distress HEENT:  no scleral icterus, pupils equal round and reactive, no palptable cervical adenopathy,  CV: RRR, 3/6 systolic murmur rusb, no jvd Resp: Clear to auscultation bilaterally GI: abdomen is soft, non-tender, non-distended, normal bowel sounds, no hepatosplenomegaly MSK: extremities are warm, no edema.  Skin: warm, no rash Neuro:  no focal deficits Psych: appropriate affect     Assessment and Plan  1. Chronic diastolic HF - has done very well since her admission 08/2018 with HF. Baseline weight around 187 lbs which she is holding, euvolemic today - continue current meds    2. CKD IV - request labs from neprhology   3. Aortic stenosis - mild to moderate, repeat echo next year   4. HTN - reasonable control, continue current meds  5. Afib - no symptoms, continue current meds including anticoag  F/u 6 months    Arnoldo Lenis, M.D.,

## 2018-12-18 DIAGNOSIS — I10 Essential (primary) hypertension: Secondary | ICD-10-CM | POA: Diagnosis not present

## 2018-12-18 DIAGNOSIS — R69 Illness, unspecified: Secondary | ICD-10-CM | POA: Diagnosis not present

## 2018-12-25 DIAGNOSIS — I10 Essential (primary) hypertension: Secondary | ICD-10-CM | POA: Diagnosis not present

## 2018-12-25 DIAGNOSIS — I4891 Unspecified atrial fibrillation: Secondary | ICD-10-CM | POA: Diagnosis not present

## 2018-12-25 DIAGNOSIS — E119 Type 2 diabetes mellitus without complications: Secondary | ICD-10-CM | POA: Diagnosis not present

## 2019-01-02 DIAGNOSIS — I5033 Acute on chronic diastolic (congestive) heart failure: Secondary | ICD-10-CM | POA: Diagnosis not present

## 2019-01-08 DIAGNOSIS — Z6834 Body mass index (BMI) 34.0-34.9, adult: Secondary | ICD-10-CM | POA: Diagnosis not present

## 2019-01-08 DIAGNOSIS — Z713 Dietary counseling and surveillance: Secondary | ICD-10-CM | POA: Diagnosis not present

## 2019-01-08 DIAGNOSIS — Z299 Encounter for prophylactic measures, unspecified: Secondary | ICD-10-CM | POA: Diagnosis not present

## 2019-01-08 DIAGNOSIS — I10 Essential (primary) hypertension: Secondary | ICD-10-CM | POA: Diagnosis not present

## 2019-01-11 DIAGNOSIS — I5032 Chronic diastolic (congestive) heart failure: Secondary | ICD-10-CM | POA: Diagnosis not present

## 2019-01-11 DIAGNOSIS — I480 Paroxysmal atrial fibrillation: Secondary | ICD-10-CM | POA: Diagnosis not present

## 2019-01-11 DIAGNOSIS — I129 Hypertensive chronic kidney disease with stage 1 through stage 4 chronic kidney disease, or unspecified chronic kidney disease: Secondary | ICD-10-CM | POA: Diagnosis not present

## 2019-01-11 DIAGNOSIS — N189 Chronic kidney disease, unspecified: Secondary | ICD-10-CM | POA: Diagnosis not present

## 2019-01-11 DIAGNOSIS — N179 Acute kidney failure, unspecified: Secondary | ICD-10-CM | POA: Diagnosis not present

## 2019-01-11 DIAGNOSIS — E1122 Type 2 diabetes mellitus with diabetic chronic kidney disease: Secondary | ICD-10-CM | POA: Diagnosis not present

## 2019-01-13 ENCOUNTER — Other Ambulatory Visit: Payer: Self-pay | Admitting: Cardiology

## 2019-01-16 DIAGNOSIS — I10 Essential (primary) hypertension: Secondary | ICD-10-CM | POA: Diagnosis not present

## 2019-01-25 DIAGNOSIS — N179 Acute kidney failure, unspecified: Secondary | ICD-10-CM | POA: Diagnosis not present

## 2019-02-02 DIAGNOSIS — I5033 Acute on chronic diastolic (congestive) heart failure: Secondary | ICD-10-CM | POA: Diagnosis not present

## 2019-02-05 DIAGNOSIS — I10 Essential (primary) hypertension: Secondary | ICD-10-CM | POA: Diagnosis not present

## 2019-02-05 DIAGNOSIS — I4891 Unspecified atrial fibrillation: Secondary | ICD-10-CM | POA: Diagnosis not present

## 2019-02-05 DIAGNOSIS — E119 Type 2 diabetes mellitus without complications: Secondary | ICD-10-CM | POA: Diagnosis not present

## 2019-02-07 DIAGNOSIS — I10 Essential (primary) hypertension: Secondary | ICD-10-CM | POA: Diagnosis not present

## 2019-02-13 DIAGNOSIS — E1122 Type 2 diabetes mellitus with diabetic chronic kidney disease: Secondary | ICD-10-CM | POA: Diagnosis not present

## 2019-02-13 DIAGNOSIS — I10 Essential (primary) hypertension: Secondary | ICD-10-CM | POA: Diagnosis not present

## 2019-02-13 DIAGNOSIS — E1165 Type 2 diabetes mellitus with hyperglycemia: Secondary | ICD-10-CM | POA: Diagnosis not present

## 2019-02-13 DIAGNOSIS — Z6833 Body mass index (BMI) 33.0-33.9, adult: Secondary | ICD-10-CM | POA: Diagnosis not present

## 2019-02-13 DIAGNOSIS — Z299 Encounter for prophylactic measures, unspecified: Secondary | ICD-10-CM | POA: Diagnosis not present

## 2019-02-13 DIAGNOSIS — I4891 Unspecified atrial fibrillation: Secondary | ICD-10-CM | POA: Diagnosis not present

## 2019-02-13 DIAGNOSIS — N184 Chronic kidney disease, stage 4 (severe): Secondary | ICD-10-CM | POA: Diagnosis not present

## 2019-02-19 DIAGNOSIS — N184 Chronic kidney disease, stage 4 (severe): Secondary | ICD-10-CM | POA: Diagnosis not present

## 2019-02-22 DIAGNOSIS — R69 Illness, unspecified: Secondary | ICD-10-CM | POA: Diagnosis not present

## 2019-03-04 DIAGNOSIS — I5033 Acute on chronic diastolic (congestive) heart failure: Secondary | ICD-10-CM | POA: Diagnosis not present

## 2019-03-05 ENCOUNTER — Other Ambulatory Visit: Payer: Self-pay | Admitting: Cardiology

## 2019-03-13 DIAGNOSIS — I10 Essential (primary) hypertension: Secondary | ICD-10-CM | POA: Diagnosis not present

## 2019-03-19 DIAGNOSIS — I10 Essential (primary) hypertension: Secondary | ICD-10-CM | POA: Diagnosis not present

## 2019-03-19 DIAGNOSIS — E119 Type 2 diabetes mellitus without complications: Secondary | ICD-10-CM | POA: Diagnosis not present

## 2019-03-19 DIAGNOSIS — I4891 Unspecified atrial fibrillation: Secondary | ICD-10-CM | POA: Diagnosis not present

## 2019-03-29 ENCOUNTER — Other Ambulatory Visit: Payer: Self-pay | Admitting: Cardiology

## 2019-04-04 DIAGNOSIS — I5033 Acute on chronic diastolic (congestive) heart failure: Secondary | ICD-10-CM | POA: Diagnosis not present

## 2019-04-05 DIAGNOSIS — E119 Type 2 diabetes mellitus without complications: Secondary | ICD-10-CM | POA: Diagnosis not present

## 2019-04-05 DIAGNOSIS — I4891 Unspecified atrial fibrillation: Secondary | ICD-10-CM | POA: Diagnosis not present

## 2019-04-05 DIAGNOSIS — I10 Essential (primary) hypertension: Secondary | ICD-10-CM | POA: Diagnosis not present

## 2019-04-12 ENCOUNTER — Other Ambulatory Visit: Payer: Self-pay | Admitting: *Deleted

## 2019-04-12 DIAGNOSIS — I509 Heart failure, unspecified: Secondary | ICD-10-CM

## 2019-04-13 DIAGNOSIS — I10 Essential (primary) hypertension: Secondary | ICD-10-CM | POA: Diagnosis not present

## 2019-05-05 DIAGNOSIS — I5033 Acute on chronic diastolic (congestive) heart failure: Secondary | ICD-10-CM | POA: Diagnosis not present

## 2019-05-09 ENCOUNTER — Ambulatory Visit (INDEPENDENT_AMBULATORY_CARE_PROVIDER_SITE_OTHER): Payer: Medicare HMO

## 2019-05-09 ENCOUNTER — Other Ambulatory Visit: Payer: Self-pay

## 2019-05-09 DIAGNOSIS — I5030 Unspecified diastolic (congestive) heart failure: Secondary | ICD-10-CM | POA: Diagnosis not present

## 2019-05-09 DIAGNOSIS — I509 Heart failure, unspecified: Secondary | ICD-10-CM

## 2019-05-10 DIAGNOSIS — E119 Type 2 diabetes mellitus without complications: Secondary | ICD-10-CM | POA: Diagnosis not present

## 2019-05-10 DIAGNOSIS — I10 Essential (primary) hypertension: Secondary | ICD-10-CM | POA: Diagnosis not present

## 2019-05-10 DIAGNOSIS — I4891 Unspecified atrial fibrillation: Secondary | ICD-10-CM | POA: Diagnosis not present

## 2019-05-14 DIAGNOSIS — I10 Essential (primary) hypertension: Secondary | ICD-10-CM | POA: Diagnosis not present

## 2019-05-17 ENCOUNTER — Telehealth: Payer: Self-pay | Admitting: *Deleted

## 2019-05-17 DIAGNOSIS — R69 Illness, unspecified: Secondary | ICD-10-CM | POA: Diagnosis not present

## 2019-05-17 NOTE — Telephone Encounter (Signed)
Pt voiced understanding - routed to pcp  

## 2019-05-17 NOTE — Telephone Encounter (Signed)
-----   Message from Arnoldo Lenis, MD sent at 05/17/2019  4:22 PM EST ----- Echo shows normal heart pumping function, stable mild to moderate valve disease. Continue to monitor  Zandra Abts MD

## 2019-05-23 DIAGNOSIS — I480 Paroxysmal atrial fibrillation: Secondary | ICD-10-CM | POA: Diagnosis not present

## 2019-05-23 DIAGNOSIS — N184 Chronic kidney disease, stage 4 (severe): Secondary | ICD-10-CM | POA: Diagnosis not present

## 2019-05-23 DIAGNOSIS — E1122 Type 2 diabetes mellitus with diabetic chronic kidney disease: Secondary | ICD-10-CM | POA: Diagnosis not present

## 2019-05-23 DIAGNOSIS — D631 Anemia in chronic kidney disease: Secondary | ICD-10-CM | POA: Diagnosis not present

## 2019-05-23 DIAGNOSIS — N2581 Secondary hyperparathyroidism of renal origin: Secondary | ICD-10-CM | POA: Diagnosis not present

## 2019-05-23 DIAGNOSIS — I129 Hypertensive chronic kidney disease with stage 1 through stage 4 chronic kidney disease, or unspecified chronic kidney disease: Secondary | ICD-10-CM | POA: Diagnosis not present

## 2019-05-23 DIAGNOSIS — N189 Chronic kidney disease, unspecified: Secondary | ICD-10-CM | POA: Diagnosis not present

## 2019-05-24 DIAGNOSIS — M25552 Pain in left hip: Secondary | ICD-10-CM | POA: Diagnosis not present

## 2019-05-24 DIAGNOSIS — R809 Proteinuria, unspecified: Secondary | ICD-10-CM | POA: Diagnosis not present

## 2019-05-24 DIAGNOSIS — Z6832 Body mass index (BMI) 32.0-32.9, adult: Secondary | ICD-10-CM | POA: Diagnosis not present

## 2019-05-24 DIAGNOSIS — E1129 Type 2 diabetes mellitus with other diabetic kidney complication: Secondary | ICD-10-CM | POA: Diagnosis not present

## 2019-05-24 DIAGNOSIS — E1142 Type 2 diabetes mellitus with diabetic polyneuropathy: Secondary | ICD-10-CM | POA: Diagnosis not present

## 2019-05-24 DIAGNOSIS — J449 Chronic obstructive pulmonary disease, unspecified: Secondary | ICD-10-CM | POA: Diagnosis not present

## 2019-05-24 DIAGNOSIS — E1165 Type 2 diabetes mellitus with hyperglycemia: Secondary | ICD-10-CM | POA: Diagnosis not present

## 2019-05-24 DIAGNOSIS — Z299 Encounter for prophylactic measures, unspecified: Secondary | ICD-10-CM | POA: Diagnosis not present

## 2019-05-24 DIAGNOSIS — I10 Essential (primary) hypertension: Secondary | ICD-10-CM | POA: Diagnosis not present

## 2019-05-28 DIAGNOSIS — R69 Illness, unspecified: Secondary | ICD-10-CM | POA: Diagnosis not present

## 2019-06-02 DIAGNOSIS — I5033 Acute on chronic diastolic (congestive) heart failure: Secondary | ICD-10-CM | POA: Diagnosis not present

## 2019-06-05 DIAGNOSIS — I10 Essential (primary) hypertension: Secondary | ICD-10-CM | POA: Diagnosis not present

## 2019-06-05 DIAGNOSIS — E119 Type 2 diabetes mellitus without complications: Secondary | ICD-10-CM | POA: Diagnosis not present

## 2019-06-05 DIAGNOSIS — I4891 Unspecified atrial fibrillation: Secondary | ICD-10-CM | POA: Diagnosis not present

## 2019-06-12 DIAGNOSIS — E1165 Type 2 diabetes mellitus with hyperglycemia: Secondary | ICD-10-CM | POA: Diagnosis not present

## 2019-06-12 DIAGNOSIS — E1122 Type 2 diabetes mellitus with diabetic chronic kidney disease: Secondary | ICD-10-CM | POA: Diagnosis not present

## 2019-06-12 DIAGNOSIS — I10 Essential (primary) hypertension: Secondary | ICD-10-CM | POA: Diagnosis not present

## 2019-06-12 DIAGNOSIS — I5032 Chronic diastolic (congestive) heart failure: Secondary | ICD-10-CM | POA: Diagnosis not present

## 2019-06-12 DIAGNOSIS — R29898 Other symptoms and signs involving the musculoskeletal system: Secondary | ICD-10-CM | POA: Diagnosis not present

## 2019-06-12 DIAGNOSIS — Z299 Encounter for prophylactic measures, unspecified: Secondary | ICD-10-CM | POA: Diagnosis not present

## 2019-06-19 DIAGNOSIS — R69 Illness, unspecified: Secondary | ICD-10-CM | POA: Diagnosis not present

## 2019-06-19 DIAGNOSIS — I10 Essential (primary) hypertension: Secondary | ICD-10-CM | POA: Diagnosis not present

## 2019-06-20 DIAGNOSIS — M25552 Pain in left hip: Secondary | ICD-10-CM | POA: Diagnosis not present

## 2019-06-20 DIAGNOSIS — M545 Low back pain: Secondary | ICD-10-CM | POA: Diagnosis not present

## 2019-06-20 DIAGNOSIS — N189 Chronic kidney disease, unspecified: Secondary | ICD-10-CM | POA: Diagnosis not present

## 2019-06-20 DIAGNOSIS — I509 Heart failure, unspecified: Secondary | ICD-10-CM | POA: Diagnosis not present

## 2019-06-20 DIAGNOSIS — Z7901 Long term (current) use of anticoagulants: Secondary | ICD-10-CM | POA: Diagnosis not present

## 2019-06-20 DIAGNOSIS — E109 Type 1 diabetes mellitus without complications: Secondary | ICD-10-CM | POA: Diagnosis not present

## 2019-06-22 ENCOUNTER — Ambulatory Visit: Payer: Medicare HMO | Admitting: Cardiology

## 2019-06-27 DIAGNOSIS — I10 Essential (primary) hypertension: Secondary | ICD-10-CM | POA: Diagnosis not present

## 2019-06-27 DIAGNOSIS — H353112 Nonexudative age-related macular degeneration, right eye, intermediate dry stage: Secondary | ICD-10-CM | POA: Diagnosis not present

## 2019-06-27 DIAGNOSIS — H35033 Hypertensive retinopathy, bilateral: Secondary | ICD-10-CM | POA: Diagnosis not present

## 2019-06-27 DIAGNOSIS — Z7984 Long term (current) use of oral hypoglycemic drugs: Secondary | ICD-10-CM | POA: Diagnosis not present

## 2019-06-27 DIAGNOSIS — H3562 Retinal hemorrhage, left eye: Secondary | ICD-10-CM | POA: Diagnosis not present

## 2019-06-27 DIAGNOSIS — H52223 Regular astigmatism, bilateral: Secondary | ICD-10-CM | POA: Diagnosis not present

## 2019-06-27 DIAGNOSIS — H524 Presbyopia: Secondary | ICD-10-CM | POA: Diagnosis not present

## 2019-06-27 DIAGNOSIS — H5203 Hypermetropia, bilateral: Secondary | ICD-10-CM | POA: Diagnosis not present

## 2019-06-27 DIAGNOSIS — H353122 Nonexudative age-related macular degeneration, left eye, intermediate dry stage: Secondary | ICD-10-CM | POA: Diagnosis not present

## 2019-06-27 DIAGNOSIS — E119 Type 2 diabetes mellitus without complications: Secondary | ICD-10-CM | POA: Diagnosis not present

## 2019-07-03 DIAGNOSIS — I10 Essential (primary) hypertension: Secondary | ICD-10-CM | POA: Diagnosis not present

## 2019-07-03 DIAGNOSIS — I4891 Unspecified atrial fibrillation: Secondary | ICD-10-CM | POA: Diagnosis not present

## 2019-07-03 DIAGNOSIS — E119 Type 2 diabetes mellitus without complications: Secondary | ICD-10-CM | POA: Diagnosis not present

## 2019-07-04 DIAGNOSIS — M25552 Pain in left hip: Secondary | ICD-10-CM | POA: Diagnosis not present

## 2019-07-05 ENCOUNTER — Ambulatory Visit: Payer: Medicare HMO | Admitting: Cardiology

## 2019-07-20 DIAGNOSIS — I10 Essential (primary) hypertension: Secondary | ICD-10-CM | POA: Diagnosis not present

## 2019-07-21 DIAGNOSIS — R69 Illness, unspecified: Secondary | ICD-10-CM | POA: Diagnosis not present

## 2019-07-30 DIAGNOSIS — H35033 Hypertensive retinopathy, bilateral: Secondary | ICD-10-CM | POA: Diagnosis not present

## 2019-08-03 ENCOUNTER — Other Ambulatory Visit: Payer: Self-pay | Admitting: Cardiology

## 2019-08-13 DIAGNOSIS — H43812 Vitreous degeneration, left eye: Secondary | ICD-10-CM | POA: Diagnosis not present

## 2019-08-13 DIAGNOSIS — H353122 Nonexudative age-related macular degeneration, left eye, intermediate dry stage: Secondary | ICD-10-CM | POA: Diagnosis not present

## 2019-08-13 DIAGNOSIS — M25552 Pain in left hip: Secondary | ICD-10-CM | POA: Diagnosis not present

## 2019-08-13 DIAGNOSIS — M1612 Unilateral primary osteoarthritis, left hip: Secondary | ICD-10-CM | POA: Diagnosis not present

## 2019-08-13 DIAGNOSIS — H353113 Nonexudative age-related macular degeneration, right eye, advanced atrophic without subfoveal involvement: Secondary | ICD-10-CM | POA: Diagnosis not present

## 2019-08-14 ENCOUNTER — Encounter: Payer: Self-pay | Admitting: Cardiology

## 2019-08-14 ENCOUNTER — Ambulatory Visit: Payer: Medicare HMO | Admitting: Cardiology

## 2019-08-14 ENCOUNTER — Other Ambulatory Visit: Payer: Self-pay

## 2019-08-14 ENCOUNTER — Other Ambulatory Visit (INDEPENDENT_AMBULATORY_CARE_PROVIDER_SITE_OTHER): Payer: Medicare HMO | Admitting: *Deleted

## 2019-08-14 VITALS — BP 114/68 | HR 64 | Ht 63.0 in | Wt 176.2 lb

## 2019-08-14 DIAGNOSIS — Z01810 Encounter for preprocedural cardiovascular examination: Secondary | ICD-10-CM | POA: Diagnosis not present

## 2019-08-14 DIAGNOSIS — I350 Nonrheumatic aortic (valve) stenosis: Secondary | ICD-10-CM | POA: Diagnosis not present

## 2019-08-14 DIAGNOSIS — I4891 Unspecified atrial fibrillation: Secondary | ICD-10-CM

## 2019-08-14 DIAGNOSIS — I5032 Chronic diastolic (congestive) heart failure: Secondary | ICD-10-CM | POA: Diagnosis not present

## 2019-08-14 NOTE — Patient Instructions (Signed)

## 2019-08-14 NOTE — Progress Notes (Signed)
Clinical Summary Ms. Weldon is a 86 y.o.female seen today for follow up of the following medical problems.   1. Chronic diastolic HF - working to limit diuretic due to her renal dysfunction 04/2018 echo: LVEF60-65%, normal RV, evidence of diastolic dysfunction  06/2681 echo LVEF 60-65%, indet DDx, mod pulm HTN, severe LAE, mod MR, mod TR, mild to mod AS.  - no recent edema, no sob or doe  2. CKDIV -followed by nephorlogy Dr Hollie Salk  3. Dyspnea - extensive pulmonmary and cardiac workup over the last few months.  Jan 2020 PFTs suggestive of restriction, mildly redcued DLCO but corrects for alveolar ventilation  04/2018 echo: LVEF 60-65%, indeterminate diastolic function,reportedmoderate aortic stenosis - 05/2018 nuclear stress: no ischemia 05/2018 CT chest high resolution: findings suggesting of interstitial lung disease vs alveolar hemorrage.  05/2018 VQ scan: low probability PE,Patchy retention of the ventilation agent centrally may be seen with air trapping or COPD.  - symptoms resolved with significant diuresis. Denies any recent dyspnea.   4. HTN -she is compliant with meds   5. Afib - no recent palpitaitons   6. Aortic stenosis - mild to moderate by 04/2018 echo - 04/2019 mild to mod AS  7. Chronic hip pain - followed by ortho  8. Preoperative evaluation - considering hip replacement  - extensive cardiac testing within the last year as reported above   Past Medical History:  Diagnosis Date  . Arthritis   . Atrial fibrillation ([place])   . CHF (congestive heart failure) ([place])   . Diabetes mellitus type II, controlled ([place])    x 15 yrs  . Elevated troponin   . Hemorrhoids   . Hypertension   . Renal insufficiency      Allergies  Allergen Reactions  . Atorvastatin Other (See Comments)    Leg pains  . Contrast Media [Iodinated Diagnostic Agents]     Pt has 1 full functioning kidney  . Penicillins Nausea And Vomiting and Rash    Did it  involve swelling of the face/tongue/throat, SOB, or low BP? No Did it involve sudden or severe rash/hives, skin peeling, or any reaction on the inside of your mouth or nose? Yes Did you need to seek medical attention at a hospital or doctor's office? Yes When did it last happen?65 yrs ago If all above answers are "NO", may proceed with cephalosporin use.      Current Outpatient Medications  Medication Sig Dispense Refill  . Calcium Carbonate-Vit D-Min (CALTRATE 600+D PLUS MINERALS) 600-800 MG-UNIT TABS Take 1 tablet by mouth daily.    . carvedilol (COREG) 6.25 MG tablet Take 1 tablet (6.25 mg total) by mouth 2 (two) times daily with a meal for 30 days. 60 tablet 0  . Cholecalciferol (VITAMIN D3) 10 MCG (400 UNIT) tablet Take 400 Units by mouth daily.    Marland Kitchen diltiazem (CARDIZEM CD) 180 MG 24 hr capsule TAKE ONE (1) CAPSULE EACH DAY 90 capsule 1  . ELIQUIS 2.5 MG TABS tablet TAKE ONE TABLET BY MOUTH TWICE DAILY 60 tablet 6  . furosemide (LASIX) 20 MG tablet Take 20 mg by mouth daily.    . furosemide (LASIX) 40 MG tablet TAKE 1 TABLET DAILY AS NEEDED FOR SWELLING 90 tablet 1  . insulin aspart protamine- aspart (NOVOLOG MIX 70/30) (70-30) 100 UNIT/ML injection Inject 0.12 mLs (12 Units total) into the skin 2 (two) times a day. 10 mL 11  . Multiple Vitamins-Minerals (PRESERVISION AREDS) CAPS Take 2 capsules by mouth daily.    Marland Kitchen  pravastatin (PRAVACHOL) 20 MG tablet TAKE 1 TABLET DAILY IN THE EVENING 90 tablet 1   No current facility-administered medications for this visit.   Facility-Administered Medications Ordered in Other Visits  Medication Dose Route Frequency Provider Last Rate Last Admin  . neomycin-polymyxin-dexameth (MAXITROL) 0.1 % ophth ointment    PRN Tonny Ezra Denne, MD   1 application at 41/32/44 1427     Past Surgical History:  Procedure Laterality Date  . [place]   [facility]  . CATARACT EXTRACTION W/PHACO  01/11/2011   Procedure: CATARACT EXTRACTION PHACO AND  INTRAOCULAR LENS PLACEMENT (IOC);  Surgeon: Tonny Tyrus Wilms;  Location: [facility];  Service: Ophthalmology;  Laterality: Right;  CDE 19.32  . CATARACT EXTRACTION W/PHACO  01/21/2011   Procedure: CATARACT EXTRACTION PHACO AND INTRAOCULAR LENS PLACEMENT (IOC);  Surgeon: Tonny Levoy Geisen;  Location: [facility];  Service: Ophthalmology;  Laterality: Left;  CDE: 21.19  . COLONOSCOPY N/A 09/12/2013   Procedure: COLONOSCOPY;  Surgeon: Rogene Houston, MD;  Location: [facility];  Service: Endoscopy;  Laterality: N/A;  125  . CYSTOSCOPY     [name]  . LITHOTRIPSY     [facility]  . PARTIAL HYSTERECTOMY       Allergies  Allergen Reactions  . Atorvastatin Other (See Comments)    Leg pains  . Contrast Media [Iodinated Diagnostic Agents]     Pt has 1 full functioning kidney  . Penicillins Nausea And Vomiting and Rash    Did it involve swelling of the face/tongue/throat, SOB, or low BP? No Did it involve sudden or severe rash/hives, skin peeling, or any reaction on the inside of your mouth or nose? Yes Did you need to seek medical attention at a hospital or doctor's office? Yes When did it last happen?65 yrs ago If all above answers are "NO", may proceed with cephalosporin use.       Family History  Problem Relation Age of Onset  . Colon cancer Brother      Social History Ms. Filippi reports that she has never smoked. She has never used smokeless tobacco. Ms. Limberg reports no history of alcohol use.   Review of Systems CONSTITUTIONAL: No weight loss, fever, chills, weakness or fatigue.  HEENT: Eyes: No visual loss, blurred vision, double vision or yellow sclerae.No hearing loss, sneezing, congestion, runny nose or sore throat.  SKIN: No rash or itching.  CARDIOVASCULAR: per hpi RESPIRATORY: No shortness of breath, cough or sputum.  GASTROINTESTINAL: No anorexia, nausea, vomiting or diarrhea. No abdominal pain or blood.  GENITOURINARY: No burning on urination, no polyuria NEUROLOGICAL: No  headache, dizziness, syncope, paralysis, ataxia, numbness or tingling in the extremities. No change in bowel or bladder control.  MUSCULOSKELETAL: +hip pain LYMPHATICS: No enlarged nodes. No history of splenectomy.  PSYCHIATRIC: No history of depression or anxiety.  ENDOCRINOLOGIC: No reports of sweating, cold or heat intolerance. No polyuria or polydipsia.  Marland Kitchen   Physical Examination Today's Vitals   08/14/19 1433  BP: 114/68  Pulse: 64  SpO2: 98%  Weight: 176 lb 3.2 oz (79.9 kg)  Height: 5\' 3"  (1.6 m)   Body mass index is 31.21 kg/m.  Gen: resting comfortably, no acute distress HEENT: no scleral icterus, pupils equal round and reactive, no palptable cervical adenopathy,  CV: RRR, no m/r/g no jvd Resp: Clear to auscultation bilaterally GI: abdomen is soft, non-tender, non-distended, normal bowel sounds, no hepatosplenomegaly MSK: extremities are warm, no edema.  Skin: warm, no rash Neuro:  no focal deficits Psych: appropriate  affect   Diagnostic Studies  04/2019 echo IMPRESSIONS    1. Left ventricular ejection fraction, by estimation, is 60 to 65%. The  left ventricle has normal function. The left ventricle has no regional  wall motion abnormalities. There is mild concentric left ventricular  hypertrophy. Left ventricular diastolic  parameters are indeterminate. Elevated left ventricular end-diastolic  pressure.  2. Right ventricular systolic function is low normal. The right  ventricular size is normal. There is moderately elevated pulmonary artery  systolic pressure.  3. Left atrial size was severely dilated.  4. Right atrial size was mildly dilated.  5. The mitral valve is degenerative. Moderate mitral valve regurgitation.  6. Tricuspid valve regurgitation is moderate.  7. Aortic valve is functionally bicuspid. Morphologically, there appears  to be moderate calcific aortic stenosis but peak velocities and gradients  are lower (in mild range). The aortic  valve is abnormal. Aortic valve  regurgitation is not visualized.  Mild to moderate aortic valve stenosis. Aortic valve area, by VTI measures  1.10 cm. Aortic valve mean gradient measures 11.5 mmHg. Aortic valve Vmax  measures 2.22 m/s.  8. The inferior vena cava is normal in size with <50% respiratory  variability, suggesting right atrial pressure of 8 mmHg.    Assessment and Plan  1.Chronic diastolic HF -no current symptoms, appears euvolemic - continue current meds   2. Aortic stenosis - mild to moderate by recent echo, continue to monitor   3. HTN - at goal, continue current meds  4. Afib - denies symptoms, continue current meds including eliquis  5. Preoperative evaluation - considering hip replacement - extensive cardiac testing within the last year including echo and stress test, overall benign findings and nothing to limit her candidacy for surgery purely from a cardiac standpoint. Would need to hold eliquis 2 days prior, resume 1 day after surgery.       Arnoldo Lenis, M.D.

## 2019-08-16 ENCOUNTER — Telehealth: Payer: Self-pay | Admitting: Cardiology

## 2019-08-16 NOTE — Telephone Encounter (Signed)
Patient with diagnosis of afib on Eliquis for anticoagulation.    Procedure: Left total hip arthroplasty Date of procedure: 09/11/19  CHADS2-VASc score of  6 (CHF, HTN, AGE, DM2,  AGE, female)  CrCl 14 ml/min  Per office protocol, patient can hold Eliquis for 4 days prior to procedure.    Patient does NOT need lovenox bridge

## 2019-08-16 NOTE — Telephone Encounter (Signed)
   Primary Cardiologist: Carlyle Dolly, MD  Chart reviewed as part of pre-operative protocol coverage. Given past medical history and time since last visit, based on ACC/AHA guidelines, CLARINE ELROD would be at acceptable risk for the planned procedure without further cardiovascular testing.   Patient with diagnosis of afib on Eliquis for anticoagulation.    Procedure: Left total hip arthroplasty Date of procedure: 09/11/19  CHADS2-VASc score of  6 (CHF, HTN, AGE, DM2,  AGE, female)  CrCl 14 ml/min  Per office protocol, patient can hold Eliquis for 4 days prior to procedure.    Patient does NOT need lovenox bridge  I will route this recommendation to the requesting party via Epic fax function and remove from pre-op pool.  Please call with questions.  Jossie Ng. Tiann Saha NP-C    08/16/2019, 2:48 PM [address] Office [phone] Fax [phone]

## 2019-08-16 NOTE — Telephone Encounter (Signed)
   [facility] Pre-operative Risk Assessment    HEARTCARE STAFF: - Please ensure there is not already an duplicate clearance open for this procedure. - Under Visit Info/Reason for Call, type in Other and utilize the format Clearance MM/DD/YY or Clearance TBD. Do not use dashes or single digits. - If request is for dental extraction, please clarify the # of teeth to be extracted.  Request for surgical clearance:  1. What type of surgery is being performed? Left total hip arthroplasty  2. When is this surgery scheduled? September 11, 2019  3. What type of clearance is required (medical clearance vs. Pharmacy clearance to hold med vs. Both)? Medical and Pharmacy clearance  4. Are there any medications that need to be held prior to surgery and how long? Eliquis 3 days prior and resume after surgery.  Asking if patient needs to bridge with lovenox  5. Practice name and name of physician performing surgery? [facility]   Dr Paralee Cancel  6. What is the office phone number? [place]   7.   What is the office fax number? [phone]  8.   Anesthesia type (None, local, MAC, general) ? Spinal   Weston Anna 08/16/2019, 8:48 AM  _________________________________________________________________   (provider comments below)

## 2019-08-19 DIAGNOSIS — E119 Type 2 diabetes mellitus without complications: Secondary | ICD-10-CM | POA: Diagnosis not present

## 2019-08-19 DIAGNOSIS — I4891 Unspecified atrial fibrillation: Secondary | ICD-10-CM | POA: Diagnosis not present

## 2019-08-19 DIAGNOSIS — I10 Essential (primary) hypertension: Secondary | ICD-10-CM | POA: Diagnosis not present

## 2019-08-23 ENCOUNTER — Other Ambulatory Visit: Payer: Self-pay | Admitting: Cardiology

## 2019-08-30 DIAGNOSIS — N184 Chronic kidney disease, stage 4 (severe): Secondary | ICD-10-CM | POA: Diagnosis not present

## 2019-08-30 DIAGNOSIS — E1165 Type 2 diabetes mellitus with hyperglycemia: Secondary | ICD-10-CM | POA: Diagnosis not present

## 2019-08-30 DIAGNOSIS — E1142 Type 2 diabetes mellitus with diabetic polyneuropathy: Secondary | ICD-10-CM | POA: Diagnosis not present

## 2019-08-30 DIAGNOSIS — I10 Essential (primary) hypertension: Secondary | ICD-10-CM | POA: Diagnosis not present

## 2019-08-30 DIAGNOSIS — Z6832 Body mass index (BMI) 32.0-32.9, adult: Secondary | ICD-10-CM | POA: Diagnosis not present

## 2019-08-30 DIAGNOSIS — E1122 Type 2 diabetes mellitus with diabetic chronic kidney disease: Secondary | ICD-10-CM | POA: Diagnosis not present

## 2019-08-30 DIAGNOSIS — Z299 Encounter for prophylactic measures, unspecified: Secondary | ICD-10-CM | POA: Diagnosis not present

## 2019-09-03 NOTE — Patient Instructions (Addendum)
DUE TO COVID-19 ONLY ONE VISITOR IS ALLOWED TO COME WITH YOU AND STAY IN THE WAITING ROOM ONLY DURING PRE OP AND PROCEDURE DAY OF SURGERY. THE 2 VISITORS  MAY VISIT WITH YOU AFTER SURGERY IN YOUR PRIVATE ROOM DURING VISITING HOURS ONLY!  YOU NEED TO HAVE A COVID 19 TEST ON  6/18__@__10 :10_____, THIS TEST MUST BE DONE BEFORE SURGERY, COME  Miranda Garcia , [id number].  ([place]) ONCE YOUR COVID TEST IS COMPLETED, PLEASE BEGIN THE QUARANTINE INSTRUCTIONS AS OUTLINED IN YOUR HANDOUT.                Miranda Garcia    Your procedure is scheduled on: 09/11/19   Report to [facility] Main  Entrance   Report to admitting at   1:30  PM     Call this number if you have problems the morning of surgery [phone]   . BRUSH YOUR TEETH MORNING OF SURGERY AND RINSE YOUR MOUTH OUT, NO CHEWING GUM CANDY OR MINTS.  Do not eat food After Midnight  . YOU MAY HAVE CLEAR LIQUIDS FROM MIDNIGHT UNTIL 1:00 PM  CLEAR LIQUID DIET   Foods Allowed                                                                     Foods Excluded  Coffee and tea, regular and decaf                             liquids that you cannot  Plain Jell-O any favor except red or purple                                           see through such as: Fruit ices (not with fruit pulp)                                     milk, soups, orange juice  Iced Popsicles                                    All solid food Carbonated beverages, regular and diet                                    Cranberry, grape and apple juices Sports drinks like Gatorade Lightly seasoned clear broth or consume(fat free) Sugar, honey syrup     At 1:00 PM  Please finish the prescribed Pre-Surgery Gatorade drink.   Nothing by mouth after you finish the Gatorade drink !    Take these medicines the morning of surgery with A SIP OF WATER: Carvedilol, Diltiazem       How to Manage Your Diabetes Before and After  Surgery  Why is it important to control my blood sugar before and after surgery?  Improving blood sugar levels before and after surgery helps healing  and can limit problems.  A way of improving blood sugar control is eating a healthy diet by: o  Eating less sugar and carbohydrates o  Increasing activity/exercise o  Talking with your doctor about reaching your blood sugar goals  High blood sugars (greater than 180 mg/dL) can raise your risk of infections and slow your recovery, so you will need to focus on controlling your diabetes during the weeks before surgery.  Make sure that the doctor who takes care of your diabetes knows about your planned surgery including the date and location.  How do I manage my blood sugar before surgery?  Check your blood sugar at least 4 times a day, starting 2 days before surgery, to make sure that the level is not too high or low. o Check your blood sugar the morning of your surgery when you wake up and every 2 hours until you get to the Short Stay unit.  If your blood sugar is less than 70 mg/dL, you will need to treat for low blood sugar: o Do not take insulin. o Treat a low blood sugar (less than 70 mg/dL) with  cup of clear juice (cranberry or apple), 4 glucose tablets, OR glucose gel. o Recheck blood sugar in 15 minutes after treatment (to make sure it is greater than 70 mg/dL). If your blood sugar is not greater than 70 mg/dL on recheck, call [phone] for further instructions.  Report your blood sugar to the short stay nurse when you get to Short Stay.   If you are admitted to the hospital after surgery: o Your blood sugar will be checked by the staff and you will probably be given insulin after surgery (instead of oral diabetes medicines) to make sure you have good blood sugar levels. o The goal for blood sugar control after surgery is 80-180 mg/dL.   WHAT DO I DO ABOUT MY DIABETES MEDICATION?   Do not take oral diabetes medicines  (pills) the morning of surgery.   THE NIGHT BEFORE SURGERY, take 7 units of Novolog  insulin.        THE MORNING OF SURGERY, take 0 units of  Novolog  insulin.                         You may not have any metal on your body including hair pins and              piercings  Do not wear jewelry, make-up, lotions, powders or perfumes, deodorant             Do not wear nail polish on your fingernails.              Do not shave  48 hours prior to surgery.               Do not bring valuables to the hospital. [place].  Contacts, dentures or bridgework may not be worn into surgery.                   Please read over the following fact sheets you were given: _____________________________________________________________________             [facility] - Preparing for Surgery  Before surgery, you can play an important role.   Because skin is not sterile, your skin needs to be as free of  germs as possible.   You can reduce the number of germs on your skin by washing with CHG (chlorahexidine gluconate) soap before surgery.   CHG is an antiseptic cleaner which kills germs and bonds with the skin to continue killing germs even after washing. Please DO NOT use if you have an allergy to CHG or antibacterial soaps   If your skin becomes reddened/irritated stop using the CHG and inform your nurse when you arrive at Short Stay. Do not shave (including legs and underarms) for at least 48 hours prior to the first CHG shower.  . Please follow these instructions carefully:   1.  Shower with CHG Soap the night before surgery and the  morning of Surgery.  2.  If you choose to wash your hair, wash your hair first as usual with your  normal  shampoo.  3.  After you shampoo, rinse your hair and body thoroughly to remove the  shampoo.                                        4.  Use CHG as you would any other liquid soap.  You can apply chg directly  to  the skin and wash                       Gently with a scrungie or clean washcloth.  5.  Apply the CHG Soap to your body ONLY FROM THE NECK DOWN.   Do not use on face/ open                           Wound or open sores. Avoid contact with eyes, ears mouth and genitals (private parts).                       Wash face,  Genitals (private parts) with your normal soap.             6.  Wash thoroughly, paying special attention to the area where your surgery  will be performed.  7.  Thoroughly rinse your body with warm water from the neck down.  8.  DO NOT shower/wash with your normal soap after using and rinsing off  the CHG Soap.             9.  Pat yourself dry with a clean towel.            10.  Wear clean pajamas.            11.  Place clean sheets on your bed the night of your first shower and do not  sleep with pets. Day of Surgery : Do not apply any lotions/deodorants the morning of surgery.  Please wear clean clothes to the hospital/surgery center.  FAILURE TO FOLLOW THESE INSTRUCTIONS MAY RESULT IN THE CANCELLATION OF YOUR SURGERY PATIENT SIGNATURE_________________________________  NURSE SIGNATURE__________________________________  ________________________________________________________________________   Miranda Garcia  An incentive spirometer is a tool that can help keep your lungs clear and active. This tool measures how well you are filling your lungs with each breath. Taking long deep breaths may help reverse or decrease the chance of developing breathing (pulmonary) problems (especially infection) following:  A long period of time when you are unable to move or be active. BEFORE THE PROCEDURE   If the spirometer includes  an indicator to show your best effort, your nurse or respiratory therapist will set it to a desired goal.  If possible, sit up straight or lean slightly forward. Try not to slouch.  Hold the incentive spirometer in an upright position. INSTRUCTIONS  FOR USE  1. Sit on the edge of your bed if possible, or sit up as far as you can in bed or on a chair. 2. Hold the incentive spirometer in an upright position. 3. Breathe out normally. 4. Place the mouthpiece in your mouth and seal your lips tightly around it. 5. Breathe in slowly and as deeply as possible, raising the piston or the ball toward the top of the column. 6. Hold your breath for 3-5 seconds or for as long as possible. Allow the piston or ball to fall to the bottom of the column. 7. Remove the mouthpiece from your mouth and breathe out normally. 8. Rest for a few seconds and repeat Steps 1 through 7 at least 10 times every 1-2 hours when you are awake. Take your time and take a few normal breaths between deep breaths. 9. The spirometer may include an indicator to show your best effort. Use the indicator as a goal to work toward during each repetition. 10. After each set of 10 deep breaths, practice coughing to be sure your lungs are clear. If you have an incision (the cut made at the time of surgery), support your incision when coughing by placing a pillow or rolled up towels firmly against it. Once you are able to get out of bed, walk around indoors and cough well. You may stop using the incentive spirometer when instructed by your caregiver.  RISKS AND COMPLICATIONS  Take your time so you do not get dizzy or light-headed.  If you are in pain, you may need to take or ask for pain medication before doing incentive spirometry. It is harder to take a deep breath if you are having pain. AFTER USE  Rest and breathe slowly and easily.  It can be helpful to keep track of a log of your progress. Your caregiver can provide you with a simple table to help with this. If you are using the spirometer at home, follow these instructions: [place] IF:   You are having difficultly using the spirometer.  You have trouble using the spirometer as often as instructed.  Your pain  medication is not giving enough relief while using the spirometer.  You develop fever of 100.5 F (38.1 C) or higher. SEEK IMMEDIATE MEDICAL CARE IF:   You cough up bloody sputum that had not been present before.  You develop fever of 102 F (38.9 C) or greater.  You develop worsening pain at or near the incision site. MAKE SURE YOU:   Understand these instructions.  Will watch your condition.  Will get help right away if you are not doing well or get worse. Document Released: 07/19/2006 Document Revised: 05/31/2011 Document Reviewed: 09/19/2006 [facility] Patient Information 2014 [place], [place].   ________________________________________________________________________

## 2019-09-04 ENCOUNTER — Encounter (HOSPITAL_COMMUNITY)
Admission: RE | Admit: 2019-09-04 | Discharge: 2019-09-04 | Disposition: A | Discharge status: Home / Self Care | Payer: Medicare HMO | Source: Ambulatory Visit | Attending: Orthopedic Surgery | Admitting: Orthopedic Surgery

## 2019-09-04 ENCOUNTER — Encounter (HOSPITAL_COMMUNITY): Payer: Self-pay

## 2019-09-04 ENCOUNTER — Other Ambulatory Visit: Payer: Self-pay

## 2019-09-04 DIAGNOSIS — Z01818 Encounter for other preprocedural examination: Secondary | ICD-10-CM | POA: Insufficient documentation

## 2019-09-04 DIAGNOSIS — Z01812 Encounter for preprocedural laboratory examination: Secondary | ICD-10-CM | POA: Diagnosis present

## 2019-09-04 HISTORY — DX: Myoneural disorder, unspecified: G70.9

## 2019-09-04 HISTORY — DX: Personal history of urinary calculi: Z87.442

## 2019-09-04 HISTORY — DX: Cardiac arrhythmia, unspecified: I49.9

## 2019-09-04 LAB — COMPREHENSIVE METABOLIC PANEL
ALT: 15 U/L (ref 0–44)
AST: 17 U/L (ref 15–41)
Albumin: 3.3 g/dL — ABNORMAL LOW (ref 3.5–5.0)
Alkaline Phosphatase: 67 U/L (ref 38–126)
Anion gap: 10 (ref 5–15)
BUN: 54 mg/dL — ABNORMAL HIGH (ref 8–23)
CO2: 28 mmol/L (ref 22–32)
Calcium: 9.1 mg/dL (ref 8.9–10.3)
Chloride: 100 mmol/L (ref 98–111)
Creatinine, Ser: 2.57 mg/dL — ABNORMAL HIGH (ref 0.44–1.00)
GFR calc Af Amer: 19 mL/min — ABNORMAL LOW (ref >60)
GFR calc non Af Amer: 16 mL/min — ABNORMAL LOW (ref >60)
Glucose, Bld: 124 mg/dL — ABNORMAL HIGH (ref 70–99)
Potassium: 3.7 mmol/L (ref 3.5–5.1)
Sodium: 138 mmol/L (ref 135–145)
Total Bilirubin: 1.1 mg/dL (ref 0.3–1.2)
Total Protein: 6.5 g/dL (ref 6.5–8.1)

## 2019-09-04 LAB — CBC
HCT: 35.1 % — ABNORMAL LOW (ref 36.0–46.0)
Hemoglobin: 11.2 g/dL — ABNORMAL LOW (ref 12.0–15.0)
MCH: 31.6 pg (ref 26.0–34.0)
MCHC: 31.9 g/dL (ref 30.0–36.0)
MCV: 99.2 fL (ref 80.0–100.0)
Platelets: 161 10*3/uL (ref 150–400)
RBC: 3.54 MIL/uL — ABNORMAL LOW (ref 3.87–5.11)
RDW: 12.9 % (ref 11.5–15.5)
WBC: 7.7 10*3/uL (ref 4.0–10.5)
nRBC: 0 % (ref 0.0–0.2)

## 2019-09-04 LAB — SURGICAL PCR SCREEN
MRSA, PCR: NEGATIVE
Staphylococcus aureus: NEGATIVE

## 2019-09-04 LAB — ABO/RH: ABO/RH(D): O POS

## 2019-09-04 NOTE — Progress Notes (Signed)
COVID Vaccine Completed:Yes Date COVID Vaccine completed:2/31/21 COVID vaccine manufacturer:  Moderna     PCP - Dr. Francesco Runner Cardiologist - Dr. Zandra Abts  Chest x-ray - 08/25/18 EKG - 08/14/19 Stress Test - no ECHO - 05/09/19 Cardiac Cath - no  Sleep Study - no CPAP -   Fasting Blood Sugar - 100-122 Checks Blood Sugar _QD____ times a day  Blood Thinner Instructions:Eliquis Aspirin Instructions:Dr. Alvan Dame said to stop 3 days prior to DOS Last Dose:09/07/19  Anesthesia review:   Patient denies shortness of breath, fever, cough and chest pain at PAT appointment yes  Patient verbalized understanding of instructions that were given to them at the PAT appointment. Patient was also instructed that they will need to review over the PAT instructions again at home before surgery. yes

## 2019-09-05 NOTE — Anesthesia Preprocedure Evaluation (Addendum)
Anesthesia Evaluation  Patient identified by MRN, date of birth, ID band Patient awake    Reviewed: Allergy & Precautions, NPO status , Patient's Chart, lab work & pertinent test results, reviewed documented beta blocker date and time   Airway Mallampati: II  TM Distance: >3 FB Neck ROM: Full    Dental no notable dental hx. (+) Teeth Intact, Caps   Pulmonary shortness of breath and with exertion,    Pulmonary exam normal breath sounds clear to auscultation       Cardiovascular hypertension, Pt. on medications and Pt. on home beta blockers +CHF  Normal cardiovascular exam+ dysrhythmias Atrial Fibrillation  Rhythm:Regular Rate:Normal     Neuro/Psych  Neuromuscular disease negative psych ROS   GI/Hepatic negative GI ROS, Neg liver ROS,   Endo/Other  diabetes, Well Controlled, Type 2, Insulin DependentHyperlipidemia  Renal/GU Renal InsufficiencyRenal disease  negative genitourinary   Musculoskeletal  (+) Arthritis , Osteoarthritis,  OA left hip Hx/o previous lower back surgery x 2   Abdominal (+) + obese,   Peds  Hematology  (+) anemia , Eliquis therapy- last dose 6/18   Anesthesia Other Findings   Reproductive/Obstetrics                           Anesthesia Physical Anesthesia Plan  ASA: III  Anesthesia Plan: Spinal   Post-op Pain Management:    Induction: Intravenous  PONV Risk Score and Plan: 3 and Ondansetron, Propofol infusion and Treatment may vary due to age or medical condition  Airway Management Planned: Natural Airway, Nasal Cannula and Simple Face Mask  Additional Equipment:   Intra-op Plan:   Post-operative Plan:   Informed Consent: I have reviewed the patients History and Physical, chart, labs and discussed the procedure including the risks, benefits and alternatives for the proposed anesthesia with the patient or authorized representative who has indicated his/her  understanding and acceptance.     Dental advisory given  Plan Discussed with: CRNA and Surgeon  Anesthesia Plan Comments: (See PAT note, Konrad Felix, PA-C)       Anesthesia Quick Evaluation

## 2019-09-05 NOTE — Progress Notes (Signed)
Anesthesia Chart Review   Case: [id number] Date/Time: 09/11/19 1545   Procedure: TOTAL HIP ARTHROPLASTY ANTERIOR APPROACH (Left Hip) - 70 mins   Anesthesia type: Spinal   Pre-op diagnosis: Left hip osteoarthritis   Location[facility] ROOM 09 / [facility]   Surgeons: Paralee Cancel, MD      DISCUSSION:86 y.o. never smoker with h/o HTN, DM II, atrial fibrillation (Eliquis), mild to moderate AS (mean gradient 11.5 mmHg, valve area 1.10 cm2), chronic diastolic HF, CKD Stage IV (creatinine stable), left hip OA scheduled for above procedure 09/11/2019 with Dr. Paralee Cancel.   Per cardiac pre-operative risk assessment 08/16/2019, "Chart reviewed as part of pre-operative protocol coverage. Given past medical history and time since last visit, based on ACC/AHA guidelines, Miranda Garcia would be at acceptable risk for the planned procedure without further cardiovascular testing.  Patient with diagnosis ofafibon Eliquisfor anticoagulation.  Procedure:Left total hip arthroplasty Date of procedure:09/11/19 CHADS2-VASc score of6(CHF, HTN, AGE,DM2, AGE, female) CrCl14 ml/min Per office protocol, patient can holdEliquisfor 4days prior to procedure.  Patient does NOT need lovenox bridge"  Anticipate pt can proceed with planned procedure barring acute status change.   VS: BP 134/71   Pulse 71   Temp 36.7 C (Oral)   Resp 18   Ht 5\' 3"  (1.6 m)   Wt 79.4 kg   SpO2 96%   BMI 31.00 kg/m   PROVIDERS: Glenda Chroman, MD is PCP   Carlyle Dolly, MD is Cardiologist  LABS: creatinine stable (all labs ordered are listed, but only abnormal results are displayed)  Labs Reviewed  CBC - Abnormal; Notable for the following components:      Result Value   RBC 3.54 (*)    Hemoglobin 11.2 (*)    HCT 35.1 (*)    All other components within normal limits  COMPREHENSIVE METABOLIC PANEL - Abnormal; Notable for the following components:   Glucose, Bld 124 (*)    BUN 54 (*)    Creatinine, Ser 2.57 (*)     Albumin 3.3 (*)    GFR calc non Af Amer 16 (*)    GFR calc Af Amer 19 (*)    All other components within normal limits  SURGICAL PCR SCREEN  TYPE AND SCREEN  ABO/RH     IMAGES:   EKG: 08/14/2019 Rate 70 bpm Atrial fibrillation   CV: Echo 05/09/2019 IMPRESSIONS    1. Left ventricular ejection fraction, by estimation, is 60 to 65%. The  left ventricle has normal function. The left ventricle has no regional  wall motion abnormalities. There is mild concentric left ventricular  hypertrophy. Left ventricular diastolic  parameters are indeterminate. Elevated left ventricular end-diastolic  pressure.  2. Right ventricular systolic function is low normal. The right  ventricular size is normal. There is moderately elevated pulmonary artery  systolic pressure.  3. Left atrial size was severely dilated.  4. Right atrial size was mildly dilated.  5. The mitral valve is degenerative. Moderate mitral valve regurgitation.  6. Tricuspid valve regurgitation is moderate.  7. Aortic valve is functionally bicuspid. Morphologically, there appears  to be moderate calcific aortic stenosis but peak velocities and gradients  are lower (in mild range). The aortic valve is abnormal. Aortic valve  regurgitation is not visualized.  Mild to moderate aortic valve stenosis. Aortic valve area, by VTI measures  1.10 cm. Aortic valve mean gradient measures 11.5 mmHg. Aortic valve Vmax  measures 2.22 m/s.  8. The inferior vena cava is normal in size with <  50% respiratory  variability, suggesting right atrial pressure of 8 mmHg.   Myocardial Perfusion 05/23/2018  There was no ST segment deviation noted during stress.  The study is normal. There are no perfusion defects  This is a low risk study.  The left ventricular ejection fraction is hyperdynamic (>65%). Past Medical History:  Diagnosis Date  . Arthritis    back, knees  . Atrial fibrillation ([place])   . CHF (congestive heart failure)  ([place]) 08/2018  . Diabetes mellitus type II, controlled ([place])    x 15 yrs  . Dysrhythmia    A-fib  . Elevated troponin   . Hemorrhoids   . History of kidney stones   . Hypertension   . Neuromuscular disorder (H[facility])    numbness in hands   . Renal insufficiency    stage iv    Past Surgical History:  Procedure Laterality Date  . [place]   [facility]  . CATARACT EXTRACTION W/PHACO  01/11/2011   Procedure: CATARACT EXTRACTION PHACO AND INTRAOCULAR LENS PLACEMENT (IOC);  Surgeon: Tonny Branch;  Location: [facility];  Service: Ophthalmology;  Laterality: Right;  CDE 19.32  . CATARACT EXTRACTION W/PHACO  01/21/2011   Procedure: CATARACT EXTRACTION PHACO AND INTRAOCULAR LENS PLACEMENT (IOC);  Surgeon: Tonny Branch;  Location: [facility];  Service: Ophthalmology;  Laterality: Left;  CDE: 21.19  . COLONOSCOPY N/A 09/12/2013   Procedure: COLONOSCOPY;  Surgeon: Rogene Houston, MD;  Location: [facility] SUITE;  Service: Endoscopy;  Laterality: N/A;  125  . CYSTOSCOPY     [name]  . LITHOTRIPSY     [facility]  . PARTIAL HYSTERECTOMY      MEDICATIONS: . Calcium Carbonate-Vit D-Min (CALTRATE 600+D PLUS MINERALS) 600-800 MG-UNIT TABS  . carvedilol (COREG) 6.25 MG tablet  . Cholecalciferol (VITAMIN D3) 10 MCG (400 UNIT) tablet  . diltiazem (CARDIZEM CD) 180 MG 24 hr capsule  . ELIQUIS 2.5 MG TABS tablet  . furosemide (LASIX) 20 MG tablet  . furosemide (LASIX) 40 MG tablet  . insulin aspart protamine- aspart (NOVOLOG MIX 70/30) (70-30) 100 UNIT/ML injection  . Multiple Vitamins-Minerals (PRESERVISION AREDS) CAPS  . pravastatin (PRAVACHOL) 20 MG tablet   No current facility-administered medications for this encounter.   . neomycin-polymyxin-dexameth (MAXITROL) 0.1 % ophth ointment     Maia Plan [facility] Pre-Surgical Testing [phone] 09/05/19  3:16 PM

## 2019-09-06 NOTE — H&P (Signed)
TOTAL HIP ADMISSION H&P  Patient is admitted for left total hip arthroplasty, anterior approach  Subjective:  Chief Complaint:   Left hip OA / pain  HPI: Miranda Garcia, 86 y.o. female, has a history of pain and functional disability in the left hip(s) due to arthritis and patient has failed non-surgical conservative treatments for greater than 12 weeks to include NSAID's and/or analgesics, corticosteriod injections, use of assistive devices and activity modification.  Onset of symptoms was gradual starting <1 year ago with gradually worsening course since that time.The patient noted no past surgery on the left hip(s).  Patient currently rates pain in the left hip at 8 out of 10 with activity. Patient has night pain, worsening of pain with activity and weight bearing, trendelenberg gait, pain that interfers with activities of daily living and pain with passive range of motion. Patient has evidence of periarticular osteophytes and joint space narrowing by imaging studies. This condition presents safety issues increasing the risk of falls.  There is no current active infection.  Risks, benefits and expectations were discussed with the patient.  Risks including but not limited to the risk of anesthesia, blood clots, nerve damage, blood vessel damage, failure of the prosthesis, infection and up to and including death.  Patient understand the risks, benefits and expectations and wishes to proceed with surgery.   PCP: Glenda Chroman, MD  D/C Plans:       Home   Post-op Meds:       No Rx given   Tranexamic Acid:      To be given - IV   Decadron:      Is to be given  FYI:      Eliquis  Norco  No Celebrex (CKD)  DME:   Pt already has equipment  PT:   [facility]  Pharmacy: [facility]   Patient Active Problem List   Diagnosis Date Noted  . Acute on chronic diastolic CHF (congestive heart failure) ([place]) 08/25/2018  . Insulin-requiring or dependent type II diabetes mellitus ([place]) 08/25/2018  .  Hemoptysis 06/02/2018  . Dyspnea 04/17/2018  . Pulmonary nodules 04/17/2018  . Heme positive stool 08/23/2013  . Long term (current) use of anticoagulants 03/03/2012  . Chronic diastolic CHF (congestive heart failure) ([place]) 02/28/2012  . Hypomagnesemia 02/26/2012  . Elevated troponin 02/26/2012  . Congestive heart failure ([place]) 02/26/2012  . Atrial fibrillation ([place]) 02/25/2012  . Chest pain 02/25/2012  . CKD (chronic kidney disease), stage IV ([place]) 02/25/2012  . Leukocytosis 02/25/2012  . Anemia due to chronic illness 02/25/2012  . Diabetes ([place]) 02/25/2012   Past Medical History:  Diagnosis Date  . Arthritis    back, knees  . Atrial fibrillation ([place])   . CHF (congestive heart failure) ([place]) 08/2018  . Diabetes mellitus type II, controlled ([place])    x 15 yrs  . Dysrhythmia    A-fib  . Elevated troponin   . Hemorrhoids   . History of kidney stones   . Hypertension   . Neuromuscular disorder ([facility])    numbness in hands   . Renal insufficiency    stage iv    Past Surgical History:  Procedure Laterality Date  . [place]   [facility]  . CATARACT EXTRACTION W/PHACO  01/11/2011   Procedure: CATARACT EXTRACTION PHACO AND INTRAOCULAR LENS PLACEMENT (IOC);  Surgeon: Tonny Branch;  Location: [facility] ORS;  Service: Ophthalmology;  Laterality: Right;  CDE 19.32  . CATARACT EXTRACTION W/PHACO  01/21/2011  Procedure: CATARACT EXTRACTION PHACO AND INTRAOCULAR LENS PLACEMENT (IOC);  Surgeon: Tonny Branch;  Location: [facility] ORS;  Service: Ophthalmology;  Laterality: Left;  CDE: 21.19  . COLONOSCOPY N/A 09/12/2013   Procedure: COLONOSCOPY;  Surgeon: Rogene Houston, MD;  Location: [facility] ENDO SUITE;  Service: Endoscopy;  Laterality: N/A;  125  . CYSTOSCOPY     [name]  . LITHOTRIPSY     [facility]  . PARTIAL HYSTERECTOMY      No current facility-administered medications for this encounter.   Current Outpatient Medications  Medication Sig Dispense Refill Last Dose  . Calcium Carbonate-Vit D-Min  (CALTRATE 600+D PLUS MINERALS) 600-800 MG-UNIT TABS Take 1 tablet by mouth daily.     . carvedilol (COREG) 6.25 MG tablet Take 6.25 mg by mouth 2 (two) times daily.      . Cholecalciferol (VITAMIN D3) 10 MCG (400 UNIT) tablet Take 400 Units by mouth daily.     Marland Kitchen diltiazem (CARDIZEM CD) 180 MG 24 hr capsule TAKE ONE (1) CAPSULE EACH DAY (Patient taking differently: Take 180 mg by mouth daily. TAKE ONE (1) CAPSULE EACH DAY) 90 capsule 1   . ELIQUIS 2.5 MG TABS tablet TAKE ONE TABLET BY MOUTH TWICE DAILY (Patient taking differently: Take 2.5 mg by mouth 2 (two) times daily. ) 60 tablet 6   . furosemide (LASIX) 20 MG tablet Take 20 mg by mouth daily.      . insulin aspart protamine- aspart (NOVOLOG MIX 70/30) (70-30) 100 UNIT/ML injection Inject 0.12 mLs (12 Units total) into the skin 2 (two) times a day. (Patient taking differently: Inject 12-15 Units into the skin 2 (two) times a day. ) 10 mL 11   . Multiple Vitamins-Minerals (PRESERVISION AREDS) CAPS Take 2 capsules by mouth daily.     . pravastatin (PRAVACHOL) 20 MG tablet TAKE 1 TABLET DAILY IN THE EVENING (Patient taking differently: Take 20 mg by mouth every evening. ) 90 tablet 1   . furosemide (LASIX) 40 MG tablet TAKE 1 TABLET DAILY AS NEEDED FOR SWELLING 90 tablet 1    Facility-Administered Medications Ordered in Other Encounters  Medication Dose Route Frequency Provider Last Rate Last Admin  . neomycin-polymyxin-dexameth (MAXITROL) 0.1 % ophth ointment    PRN Tonny Branch, MD   1 application at 99/83/38 1427   Allergies  Allergen Reactions  . Atorvastatin Other (See Comments)    Leg pains  . Contrast Media [Iodinated Diagnostic Agents]     Pt has 1 full functioning kidney  . Penicillins Nausea And Vomiting and Rash    Did it involve swelling of the face/tongue/throat, SOB, or low BP? No Did it involve sudden or severe rash/hives, skin peeling, or any reaction on the inside of your mouth or nose? Yes Did you need to seek medical  attention at a hospital or doctor's office? Yes When did it last happen?65 yrs ago If all above answers are "NO", may proceed with cephalosporin use.     Social History   Tobacco Use  . Smoking status: Never Smoker  . Smokeless tobacco: Never Used  Substance Use Topics  . Alcohol use: No    Alcohol/week: 0.0 standard drinks    Family History  Problem Relation Age of Onset  . Colon cancer Brother      Review of Systems  Constitutional: Negative.   HENT: Negative.   Eyes: Negative.   Respiratory: Negative.   Cardiovascular: Negative.   Gastrointestinal: Negative.   Genitourinary: Negative.   Musculoskeletal: Positive for joint pain.  Skin: Negative.   Neurological: Negative.   Endo/Heme/Allergies: Negative.   Psychiatric/Behavioral: Negative.      Objective:  Physical Exam  Constitutional: She is oriented to person, place, and time. She appears well-developed.  HENT:  Head: Normocephalic.  Eyes: Pupils are equal, round, and reactive to light.  Neck: No JVD present. No tracheal deviation present. No thyromegaly present.  Cardiovascular: Normal rate and regular rhythm.  Respiratory: Effort normal and breath sounds normal. No respiratory distress. She has no wheezes.  GI: Soft. There is no abdominal tenderness. There is no guarding.  Musculoskeletal:     Cervical back: Neck supple.     Left hip: Tenderness and bony tenderness present. No deformity or lacerations. Decreased range of motion. Decreased strength.  Lymphadenopathy:    She has no cervical adenopathy.  Neurological: She is alert and oriented to person, place, and time.  Skin: Skin is warm and dry.      Labs:  Estimated body mass index is 31 kg/m as calculated from the following:   Height as of 09/04/19: 5\' 3"  (1.6 m).   Weight as of 09/04/19: 79.4 kg.   Imaging Review Plain radiographs demonstrate severe degenerative joint disease of the left hip. The bone quality appears to be good for age  and reported activity level.      Assessment/Plan:  End stage arthritis, left hip  The patient history, physical examination, clinical judgement of the provider and imaging studies are consistent with end stage degenerative joint disease of the left hip(s) and total hip arthroplasty is deemed medically necessary. The treatment options including medical management, injection therapy, arthroscopy and arthroplasty were discussed at length. The risks and benefits of total hip arthroplasty were presented and reviewed. The risks due to aseptic loosening, infection, stiffness, dislocation/subluxation,  thromboembolic complications and other imponderables were discussed.  The patient acknowledged the explanation, agreed to proceed with the plan and consent was signed. Patient is being admitted for treatment for surgery, pain control, PT, OT, prophylactic antibiotics, VTE prophylaxis, progressive ambulation and ADL's and discharge planning.The patient is planning to be discharged home.   Anticipated LOS equal to or greater than 2 midnights due to - Age 12 and older with one or more of the following:  - Obesity  - Expected need for hospital services (PT, OT, Nursing) required for safe  discharge  - Anticipated need for postoperative skilled nursing care or inpatient rehab  - Active co-morbidities: Diabetes, Coronary Artery Disease, Cardiac Arrhythmia and CKD

## 2019-09-07 ENCOUNTER — Other Ambulatory Visit (HOSPITAL_COMMUNITY)
Admission: RE | Admit: 2019-09-07 | Discharge: 2019-09-07 | Disposition: A | Discharge status: Home / Self Care | Payer: Medicare HMO | Source: Ambulatory Visit | Attending: Orthopedic Surgery | Admitting: Orthopedic Surgery

## 2019-09-07 DIAGNOSIS — Z01812 Encounter for preprocedural laboratory examination: Secondary | ICD-10-CM | POA: Diagnosis not present

## 2019-09-07 DIAGNOSIS — Z20822 Contact with and (suspected) exposure to covid-19: Secondary | ICD-10-CM | POA: Diagnosis not present

## 2019-09-07 LAB — SARS CORONAVIRUS 2 (TAT 6-24 HRS): SARS Coronavirus 2: NEGATIVE

## 2019-09-11 ENCOUNTER — Ambulatory Visit (HOSPITAL_COMMUNITY): Payer: Medicare HMO | Admitting: Physician Assistant

## 2019-09-11 ENCOUNTER — Encounter (HOSPITAL_COMMUNITY): Payer: Self-pay | Admitting: Orthopedic Surgery

## 2019-09-11 ENCOUNTER — Ambulatory Visit (HOSPITAL_COMMUNITY): Payer: Medicare HMO

## 2019-09-11 ENCOUNTER — Observation Stay (HOSPITAL_COMMUNITY): Payer: Medicare HMO

## 2019-09-11 ENCOUNTER — Encounter (HOSPITAL_COMMUNITY)
Admission: RE | Disposition: A | Discharge status: Home / Self Care | Payer: Self-pay | Source: Home / Self Care | Attending: Orthopedic Surgery

## 2019-09-11 ENCOUNTER — Other Ambulatory Visit: Payer: Self-pay

## 2019-09-11 ENCOUNTER — Ambulatory Visit (HOSPITAL_COMMUNITY): Payer: Medicare HMO | Admitting: Certified Registered"

## 2019-09-11 ENCOUNTER — Observation Stay (HOSPITAL_COMMUNITY)
Admission: RE | Admit: 2019-09-11 | Discharge: 2019-09-12 | Disposition: A | Discharge status: Home / Self Care | Payer: Medicare HMO | Attending: Orthopedic Surgery | Admitting: Orthopedic Surgery

## 2019-09-11 DIAGNOSIS — I5033 Acute on chronic diastolic (congestive) heart failure: Secondary | ICD-10-CM | POA: Diagnosis not present

## 2019-09-11 DIAGNOSIS — Z91041 Radiographic dye allergy status: Secondary | ICD-10-CM | POA: Insufficient documentation

## 2019-09-11 DIAGNOSIS — Z809 Family history of malignant neoplasm, unspecified: Secondary | ICD-10-CM | POA: Insufficient documentation

## 2019-09-11 DIAGNOSIS — Z87442 Personal history of urinary calculi: Secondary | ICD-10-CM | POA: Diagnosis not present

## 2019-09-11 DIAGNOSIS — Z96649 Presence of unspecified artificial hip joint: Secondary | ICD-10-CM

## 2019-09-11 DIAGNOSIS — Z90710 Acquired absence of both cervix and uterus: Secondary | ICD-10-CM | POA: Diagnosis not present

## 2019-09-11 DIAGNOSIS — Z79899 Other long term (current) drug therapy: Secondary | ICD-10-CM | POA: Insufficient documentation

## 2019-09-11 DIAGNOSIS — Z880 Allergy status to penicillin: Secondary | ICD-10-CM | POA: Diagnosis not present

## 2019-09-11 DIAGNOSIS — Z961 Presence of intraocular lens: Secondary | ICD-10-CM | POA: Diagnosis not present

## 2019-09-11 DIAGNOSIS — D631 Anemia in chronic kidney disease: Secondary | ICD-10-CM | POA: Insufficient documentation

## 2019-09-11 DIAGNOSIS — E669 Obesity, unspecified: Secondary | ICD-10-CM | POA: Diagnosis not present

## 2019-09-11 DIAGNOSIS — Z7901 Long term (current) use of anticoagulants: Secondary | ICD-10-CM | POA: Diagnosis not present

## 2019-09-11 DIAGNOSIS — E1122 Type 2 diabetes mellitus with diabetic chronic kidney disease: Secondary | ICD-10-CM | POA: Diagnosis not present

## 2019-09-11 DIAGNOSIS — N184 Chronic kidney disease, stage 4 (severe): Secondary | ICD-10-CM | POA: Diagnosis not present

## 2019-09-11 DIAGNOSIS — I130 Hypertensive heart and chronic kidney disease with heart failure and stage 1 through stage 4 chronic kidney disease, or unspecified chronic kidney disease: Secondary | ICD-10-CM | POA: Diagnosis not present

## 2019-09-11 DIAGNOSIS — Z6831 Body mass index (BMI) 31.0-31.9, adult: Secondary | ICD-10-CM | POA: Diagnosis not present

## 2019-09-11 DIAGNOSIS — I4891 Unspecified atrial fibrillation: Secondary | ICD-10-CM | POA: Insufficient documentation

## 2019-09-11 DIAGNOSIS — D72829 Elevated white blood cell count, unspecified: Secondary | ICD-10-CM | POA: Diagnosis not present

## 2019-09-11 DIAGNOSIS — M1612 Unilateral primary osteoarthritis, left hip: Secondary | ICD-10-CM | POA: Diagnosis not present

## 2019-09-11 DIAGNOSIS — Z9842 Cataract extraction status, left eye: Secondary | ICD-10-CM | POA: Diagnosis not present

## 2019-09-11 DIAGNOSIS — Z794 Long term (current) use of insulin: Secondary | ICD-10-CM | POA: Diagnosis not present

## 2019-09-11 DIAGNOSIS — Z888 Allergy status to other drugs, medicaments and biological substances status: Secondary | ICD-10-CM | POA: Diagnosis not present

## 2019-09-11 DIAGNOSIS — R0602 Shortness of breath: Secondary | ICD-10-CM | POA: Insufficient documentation

## 2019-09-11 DIAGNOSIS — Z9841 Cataract extraction status, right eye: Secondary | ICD-10-CM | POA: Diagnosis not present

## 2019-09-11 DIAGNOSIS — Z96642 Presence of left artificial hip joint: Secondary | ICD-10-CM | POA: Diagnosis not present

## 2019-09-11 DIAGNOSIS — G709 Myoneural disorder, unspecified: Secondary | ICD-10-CM | POA: Insufficient documentation

## 2019-09-11 DIAGNOSIS — Z881 Allergy status to other antibiotic agents status: Secondary | ICD-10-CM | POA: Diagnosis not present

## 2019-09-11 DIAGNOSIS — I2510 Atherosclerotic heart disease of native coronary artery without angina pectoris: Secondary | ICD-10-CM | POA: Insufficient documentation

## 2019-09-11 DIAGNOSIS — Z419 Encounter for procedure for purposes other than remedying health state, unspecified: Secondary | ICD-10-CM

## 2019-09-11 DIAGNOSIS — Z471 Aftercare following joint replacement surgery: Secondary | ICD-10-CM | POA: Diagnosis not present

## 2019-09-11 HISTORY — PX: TOTAL HIP ARTHROPLASTY: SHX124

## 2019-09-11 LAB — TYPE AND SCREEN
ABO/RH(D): O POS
Antibody Screen: NEGATIVE

## 2019-09-11 LAB — GLUCOSE, CAPILLARY
Glucose-Capillary: 205 mg/dL — ABNORMAL HIGH (ref 70–99)
Glucose-Capillary: 140 mg/dL — ABNORMAL HIGH (ref 70–99)
Glucose-Capillary: 347 mg/dL — ABNORMAL HIGH (ref 70–99)

## 2019-09-11 SURGERY — ARTHROPLASTY, HIP, TOTAL, ANTERIOR APPROACH
Anesthesia: Spinal | Site: Hip | Laterality: Left

## 2019-09-11 MED ORDER — ONDANSETRON HCL 4 MG/2ML IJ SOLN: 4.0000 mg | Freq: Once | INTRAMUSCULAR | Status: DC | PRN

## 2019-09-11 MED ORDER — DIPHENHYDRAMINE HCL 12.5 MG/5ML PO ELIX: 12.5000 mg | ORAL_SOLUTION | ORAL | Status: DC | PRN

## 2019-09-11 MED ORDER — MEPERIDINE HCL 50 MG/ML IJ SOLN: 6.2500 mg | INTRAMUSCULAR | Status: DC | PRN

## 2019-09-11 MED ORDER — INSULIN ASPART 100 UNIT/ML ~~LOC~~ SOLN
0.0000 [IU] | Freq: Every day | SUBCUTANEOUS | Status: DC
  Administered 2019-09-11: 4 [IU] via SUBCUTANEOUS

## 2019-09-11 MED ORDER — PROPOFOL 500 MG/50ML IV EMUL
INTRAVENOUS | Status: DC | PRN
  Administered 2019-09-11: 100 ug/kg/min via INTRAVENOUS

## 2019-09-11 MED ORDER — ONDANSETRON HCL 4 MG/2ML IJ SOLN
INTRAMUSCULAR | Status: DC | PRN
  Administered 2019-09-11: 4 mg via INTRAVENOUS

## 2019-09-11 MED ORDER — CEFAZOLIN SODIUM-DEXTROSE 2-4 GM/100ML-% IV SOLN
2.0000 g | Freq: Four times a day (QID) | INTRAVENOUS | Status: AC
  Administered 2019-09-11 – 2019-09-12 (×2): 2 g via INTRAVENOUS
  Filled 2019-09-11 (×2): qty 100

## 2019-09-11 MED ORDER — PHENYLEPHRINE 40 MCG/ML (10ML) SYRINGE FOR IV PUSH (FOR BLOOD PRESSURE SUPPORT)
PREFILLED_SYRINGE | INTRAVENOUS | Status: AC
  Filled 2019-09-11: qty 10

## 2019-09-11 MED ORDER — SODIUM CHLORIDE 0.9 % IR SOLN
Status: DC | PRN
  Administered 2019-09-11: 1000 mL

## 2019-09-11 MED ORDER — BISACODYL 10 MG RE SUPP: 10.0000 mg | Freq: Every day | RECTAL | Status: DC | PRN

## 2019-09-11 MED ORDER — ONDANSETRON HCL 4 MG PO TABS: 4.0000 mg | ORAL_TABLET | Freq: Four times a day (QID) | ORAL | Status: DC | PRN

## 2019-09-11 MED ORDER — CHLORHEXIDINE GLUCONATE 0.12 % MT SOLN
15.0000 mL | Freq: Once | OROMUCOSAL | Status: AC
  Administered 2019-09-11: 15 mL via OROMUCOSAL

## 2019-09-11 MED ORDER — PHENOL 1.4 % MT LIQD: 1.0000 | OROMUCOSAL | Status: DC | PRN

## 2019-09-11 MED ORDER — BUPIVACAINE IN DEXTROSE 0.75-8.25 % IT SOLN
INTRATHECAL | Status: DC | PRN
  Administered 2019-09-11: 1.5 mL via INTRATHECAL

## 2019-09-11 MED ORDER — ACETAMINOPHEN 500 MG PO TABS
500.0000 mg | ORAL_TABLET | Freq: Four times a day (QID) | ORAL | Status: AC
  Administered 2019-09-11 – 2019-09-12 (×4): 500 mg via ORAL
  Filled 2019-09-11 (×4): qty 1

## 2019-09-11 MED ORDER — MORPHINE SULFATE (PF) 2 MG/ML IV SOLN: 0.5000 mg | INTRAVENOUS | Status: DC | PRN

## 2019-09-11 MED ORDER — DILTIAZEM HCL ER COATED BEADS 180 MG PO CP24
180.0000 mg | ORAL_CAPSULE | Freq: Every day | ORAL | Status: DC
  Administered 2019-09-12: 180 mg via ORAL
  Filled 2019-09-11: qty 1

## 2019-09-11 MED ORDER — DEXAMETHASONE SODIUM PHOSPHATE 10 MG/ML IJ SOLN
10.0000 mg | Freq: Once | INTRAMUSCULAR | Status: AC
  Administered 2019-09-11: 8 mg via INTRAVENOUS

## 2019-09-11 MED ORDER — ALUM & MAG HYDROXIDE-SIMETH 200-200-20 MG/5ML PO SUSP: 15.0000 mL | ORAL | Status: DC | PRN

## 2019-09-11 MED ORDER — METOCLOPRAMIDE HCL 5 MG/ML IJ SOLN: 5.0000 mg | Freq: Three times a day (TID) | INTRAMUSCULAR | Status: DC | PRN

## 2019-09-11 MED ORDER — METHOCARBAMOL 500 MG IVPB - SIMPLE MED
500.0000 mg | Freq: Four times a day (QID) | INTRAVENOUS | Status: DC | PRN
  Filled 2019-09-11: qty 50

## 2019-09-11 MED ORDER — DEXAMETHASONE SODIUM PHOSPHATE 10 MG/ML IJ SOLN
INTRAMUSCULAR | Status: AC
  Filled 2019-09-11: qty 1

## 2019-09-11 MED ORDER — HYDROCODONE-ACETAMINOPHEN 7.5-325 MG PO TABS: 1.0000 | ORAL_TABLET | ORAL | Status: DC | PRN

## 2019-09-11 MED ORDER — CEFAZOLIN SODIUM-DEXTROSE 2-4 GM/100ML-% IV SOLN
2.0000 g | INTRAVENOUS | Status: AC
  Administered 2019-09-11: 2 g via INTRAVENOUS
  Filled 2019-09-11: qty 100

## 2019-09-11 MED ORDER — ACETAMINOPHEN 325 MG PO TABS: 325.0000 mg | ORAL_TABLET | Freq: Four times a day (QID) | ORAL | Status: DC | PRN

## 2019-09-11 MED ORDER — TRANEXAMIC ACID-NACL 1000-0.7 MG/100ML-% IV SOLN
1000.0000 mg | INTRAVENOUS | Status: AC
  Administered 2019-09-11: 1000 mg via INTRAVENOUS
  Filled 2019-09-11: qty 100

## 2019-09-11 MED ORDER — HYDROCODONE-ACETAMINOPHEN 5-325 MG PO TABS
1.0000 | ORAL_TABLET | ORAL | Status: DC | PRN
  Administered 2019-09-11: 1 via ORAL
  Filled 2019-09-11: qty 1

## 2019-09-11 MED ORDER — FERROUS SULFATE 325 (65 FE) MG PO TABS
325.0000 mg | ORAL_TABLET | Freq: Three times a day (TID) | ORAL | Status: DC
  Administered 2019-09-11 – 2019-09-12 (×3): 325 mg via ORAL
  Filled 2019-09-11 (×3): qty 1

## 2019-09-11 MED ORDER — MENTHOL 3 MG MT LOZG: 1.0000 | LOZENGE | OROMUCOSAL | Status: DC | PRN

## 2019-09-11 MED ORDER — METHOCARBAMOL 500 MG PO TABS
500.0000 mg | ORAL_TABLET | Freq: Four times a day (QID) | ORAL | Status: DC | PRN
  Administered 2019-09-12 (×2): 500 mg via ORAL
  Filled 2019-09-11 (×2): qty 1

## 2019-09-11 MED ORDER — PROPOFOL 10 MG/ML IV BOLUS
INTRAVENOUS | Status: DC | PRN
  Administered 2019-09-11: 20 mg via INTRAVENOUS
  Administered 2019-09-11: 10 mg via INTRAVENOUS
  Administered 2019-09-11: 30 mg via INTRAVENOUS
  Administered 2019-09-11: 10 mg via INTRAVENOUS

## 2019-09-11 MED ORDER — INSULIN ASPART 100 UNIT/ML ~~LOC~~ SOLN
0.0000 [IU] | Freq: Three times a day (TID) | SUBCUTANEOUS | Status: DC
  Administered 2019-09-12: 11 [IU] via SUBCUTANEOUS
  Administered 2019-09-12: 5 [IU] via SUBCUTANEOUS

## 2019-09-11 MED ORDER — ONDANSETRON HCL 4 MG/2ML IJ SOLN
INTRAMUSCULAR | Status: AC
  Filled 2019-09-11: qty 2

## 2019-09-11 MED ORDER — METOCLOPRAMIDE HCL 5 MG PO TABS: 5.0000 mg | ORAL_TABLET | Freq: Three times a day (TID) | ORAL | Status: DC | PRN

## 2019-09-11 MED ORDER — DOCUSATE SODIUM 100 MG PO CAPS
100.0000 mg | ORAL_CAPSULE | Freq: Two times a day (BID) | ORAL | Status: DC
  Administered 2019-09-11 – 2019-09-12 (×2): 100 mg via ORAL
  Filled 2019-09-11 (×2): qty 1

## 2019-09-11 MED ORDER — METHOCARBAMOL 500 MG IVPB - SIMPLE MED
INTRAVENOUS | Status: AC
  Administered 2019-09-11: 500 mg via INTRAVENOUS
  Filled 2019-09-11: qty 50

## 2019-09-11 MED ORDER — APIXABAN 2.5 MG PO TABS
2.5000 mg | ORAL_TABLET | Freq: Two times a day (BID) | ORAL | Status: DC
  Administered 2019-09-12: 2.5 mg via ORAL
  Filled 2019-09-11: qty 1

## 2019-09-11 MED ORDER — PHENYLEPHRINE 40 MCG/ML (10ML) SYRINGE FOR IV PUSH (FOR BLOOD PRESSURE SUPPORT)
PREFILLED_SYRINGE | INTRAVENOUS | Status: DC | PRN
  Administered 2019-09-11: 120 ug via INTRAVENOUS

## 2019-09-11 MED ORDER — CARVEDILOL 6.25 MG PO TABS
6.2500 mg | ORAL_TABLET | Freq: Two times a day (BID) | ORAL | Status: DC
  Administered 2019-09-11 – 2019-09-12 (×2): 6.25 mg via ORAL
  Filled 2019-09-11 (×2): qty 1

## 2019-09-11 MED ORDER — FUROSEMIDE 20 MG PO TABS
20.0000 mg | ORAL_TABLET | Freq: Every day | ORAL | Status: DC
  Administered 2019-09-12: 20 mg via ORAL
  Filled 2019-09-11: qty 1

## 2019-09-11 MED ORDER — MAGNESIUM CITRATE PO SOLN: 1.0000 | Freq: Once | ORAL | Status: DC | PRN

## 2019-09-11 MED ORDER — FENTANYL CITRATE (PF) 100 MCG/2ML IJ SOLN: 25.0000 ug | INTRAMUSCULAR | Status: DC | PRN

## 2019-09-11 MED ORDER — INSULIN ASPART PROT & ASPART (70-30 MIX) 100 UNIT/ML ~~LOC~~ SUSP
12.0000 [IU] | Freq: Two times a day (BID) | SUBCUTANEOUS | Status: DC
  Administered 2019-09-12: 14 [IU] via SUBCUTANEOUS
  Filled 2019-09-11: qty 10

## 2019-09-11 MED ORDER — ORAL CARE MOUTH RINSE: 15.0000 mL | Freq: Once | OROMUCOSAL | Status: AC

## 2019-09-11 MED ORDER — ONDANSETRON HCL 4 MG/2ML IJ SOLN: 4.0000 mg | Freq: Four times a day (QID) | INTRAMUSCULAR | Status: DC | PRN

## 2019-09-11 MED ORDER — PRAVASTATIN SODIUM 20 MG PO TABS
20.0000 mg | ORAL_TABLET | Freq: Every evening | ORAL | Status: DC
  Administered 2019-09-11: 20 mg via ORAL
  Filled 2019-09-11: qty 1

## 2019-09-11 MED ORDER — DEXAMETHASONE SODIUM PHOSPHATE 10 MG/ML IJ SOLN
10.0000 mg | Freq: Once | INTRAMUSCULAR | Status: AC
  Administered 2019-09-12: 10 mg via INTRAVENOUS
  Filled 2019-09-11: qty 1

## 2019-09-11 MED ORDER — SODIUM CHLORIDE 0.9 % IV SOLN: INTRAVENOUS | Status: DC

## 2019-09-11 MED ORDER — LACTATED RINGERS IV SOLN: INTRAVENOUS | Status: DC

## 2019-09-11 MED ORDER — FUROSEMIDE 40 MG PO TABS: 40.0000 mg | ORAL_TABLET | Freq: Every day | ORAL | Status: DC | PRN

## 2019-09-11 MED ORDER — FENTANYL CITRATE (PF) 100 MCG/2ML IJ SOLN
INTRAMUSCULAR | Status: AC
  Administered 2019-09-11: 50 ug via INTRAVENOUS
  Filled 2019-09-11: qty 2

## 2019-09-11 MED ORDER — TRANEXAMIC ACID-NACL 1000-0.7 MG/100ML-% IV SOLN
1000.0000 mg | Freq: Once | INTRAVENOUS | Status: AC
  Administered 2019-09-11: 1000 mg via INTRAVENOUS
  Filled 2019-09-11: qty 100

## 2019-09-11 MED ORDER — POLYETHYLENE GLYCOL 3350 17 G PO PACK
17.0000 g | PACK | Freq: Two times a day (BID) | ORAL | Status: DC
  Administered 2019-09-12: 17 g via ORAL
  Filled 2019-09-11 (×2): qty 1

## 2019-09-11 SURGICAL SUPPLY — 50 items
ADH SKN CLS APL DERMABOND .7 (GAUZE/BANDAGES/DRESSINGS) ×1
BAG DECANTER FOR FLEXI CONT (MISCELLANEOUS) IMPLANT
BAG SPEC THK2 15X12 ZIP CLS (MISCELLANEOUS)
BAG ZIPLOCK 12X15 (MISCELLANEOUS) IMPLANT
BLADE SAG 18X100X1.27 (BLADE) ×3 IMPLANT
BLADE SURG SZ10 CARB STEEL (BLADE) ×6 IMPLANT
COVER PERINEAL POST (MISCELLANEOUS) ×3 IMPLANT
COVER SURGICAL LIGHT HANDLE (MISCELLANEOUS) ×3 IMPLANT
COVER WAND RF STERILE (DRAPES) IMPLANT
CUP ACETBLR 52 OD PINNACLE (Hips) ×2 IMPLANT
DERMABOND ADVANCED (GAUZE/BANDAGES/DRESSINGS) ×2
DERMABOND ADVANCED .7 DNX12 (GAUZE/BANDAGES/DRESSINGS) ×1 IMPLANT
DRAPE STERI IOBAN 125X83 (DRAPES) ×3 IMPLANT
DRAPE U-SHAPE 47X51 STRL (DRAPES) ×6 IMPLANT
DRESSING AQUACEL AG SP 3.5X10 (GAUZE/BANDAGES/DRESSINGS) ×1 IMPLANT
DRSG AQUACEL AG SP 3.5X10 (GAUZE/BANDAGES/DRESSINGS) ×3
DURAPREP 26ML APPLICATOR (WOUND CARE) ×3 IMPLANT
ELECT REM PT RETURN 15FT ADLT (MISCELLANEOUS) ×3 IMPLANT
ELIMINATOR HOLE APEX DEPUY (Hips) ×2 IMPLANT
GLOVE BIO SURGEON STRL SZ 6 (GLOVE) ×6 IMPLANT
GLOVE BIOGEL PI IND STRL 6.5 (GLOVE) ×1 IMPLANT
GLOVE BIOGEL PI IND STRL 7.5 (GLOVE) ×1 IMPLANT
GLOVE BIOGEL PI IND STRL 8.5 (GLOVE) ×1 IMPLANT
GLOVE BIOGEL PI INDICATOR 6.5 (GLOVE) ×2
GLOVE BIOGEL PI INDICATOR 7.5 (GLOVE) ×2
GLOVE BIOGEL PI INDICATOR 8.5 (GLOVE) ×2
GLOVE ECLIPSE 8.0 STRL XLNG CF (GLOVE) ×6 IMPLANT
GLOVE ORTHO TXT STRL SZ7.5 (GLOVE) ×6 IMPLANT
GOWN STRL REUS W/TWL LRG LVL3 (GOWN DISPOSABLE) ×6 IMPLANT
GOWN STRL REUS W/TWL XL LVL3 (GOWN DISPOSABLE) ×3 IMPLANT
HEAD M SROM 36MM PLUS 1.5 (Hips) IMPLANT
HOLDER FOLEY CATH W/STRAP (MISCELLANEOUS) ×3 IMPLANT
KIT TURNOVER KIT A (KITS) IMPLANT
LINER NEUTRAL 52X36MM PLUS 4 (Liner) ×2 IMPLANT
PACK ANTERIOR HIP CUSTOM (KITS) ×3 IMPLANT
PENCIL SMOKE EVACUATOR (MISCELLANEOUS) ×2 IMPLANT
SCREW 6.5MMX30MM (Screw) ×2 IMPLANT
SLEEVE SUCTION CATH 165 (SLEEVE) ×2 IMPLANT
SROM M HEAD 36MM PLUS 1.5 (Hips) ×3 IMPLANT
STEM FEMORAL SZ 5MM STD ACTIS (Stem) ×2 IMPLANT
SUT MNCRL AB 4-0 PS2 18 (SUTURE) ×3 IMPLANT
SUT STRATAFIX 0 PDS 27 VIOLET (SUTURE) ×3
SUT VIC AB 1 CT1 36 (SUTURE) ×9 IMPLANT
SUT VIC AB 2-0 CT1 27 (SUTURE) ×6
SUT VIC AB 2-0 CT1 TAPERPNT 27 (SUTURE) ×2 IMPLANT
SUTURE STRATFX 0 PDS 27 VIOLET (SUTURE) ×1 IMPLANT
TRAY FOLEY MTR SLVR 14FR STAT (SET/KITS/TRAYS/PACK) ×2 IMPLANT
TRAY FOLEY MTR SLVR 16FR STAT (SET/KITS/TRAYS/PACK) IMPLANT
WATER STERILE IRR 1000ML POUR (IV SOLUTION) ×5 IMPLANT
YANKAUER SUCT BULB TIP 10FT TU (MISCELLANEOUS) IMPLANT

## 2019-09-11 NOTE — Transfer of Care (Signed)
Immediate Anesthesia Transfer of Care Note  Patient: Miranda Garcia  Procedure(s) Performed: TOTAL HIP ARTHROPLASTY ANTERIOR APPROACH (Left Hip)  Patient Location: PACU  Anesthesia Type:Spinal  Level of Consciousness: awake, alert  and oriented  Airway & Oxygen Therapy: Patient Spontanous Breathing and Patient connected to face mask oxygen  Post-op Assessment: Report given to RN and Post -op Vital signs reviewed and stable  Post vital signs: Reviewed and unstable  Last Vitals:  Vitals Value Taken Time  BP 105/53 09/11/19 1652  Temp    Pulse 72 09/11/19 1654  Resp 18 09/11/19 1654  SpO2 100 % 09/11/19 1654  Vitals shown include unvalidated device data.  Last Pain:  Vitals:   09/11/19 1314  TempSrc:   PainSc: 3       Patients Stated Pain Goal: 3 (70/01/74 9449)  Complications: No complications documented.

## 2019-09-11 NOTE — Interval H&P Note (Signed)
History and Physical Interval Note:  09/11/2019 1:53 PM  Miranda Garcia  has presented today for surgery, with the diagnosis of Left hip osteoarthritis.  The various methods of treatment have been discussed with the patient and family. After consideration of risks, benefits and other options for treatment, the patient has consented to  Procedure(s) with comments: TOTAL HIP ARTHROPLASTY ANTERIOR APPROACH (Left) - 70 mins as a surgical intervention.  The patient's history has been reviewed, patient examined, no change in status, stable for surgery.  I have reviewed the patient's chart and labs.  Questions were answered to the patient's satisfaction.     Mauri Pole

## 2019-09-11 NOTE — Op Note (Signed)
NAME:  Miranda Garcia                ACCOUNT NO.: [id number]      MEDICAL RECORD NO.: [id number]      FACILITY:  [facility]      PHYSICIAN:  Mauri Pole  DATE OF BIRTH:  [birth year]     DATE OF PROCEDURE:  09/11/2019                                 OPERATIVE REPORT         PREOPERATIVE DIAGNOSIS: Left  hip osteoarthritis.      POSTOPERATIVE DIAGNOSIS:  Left hip osteoarthritis.      PROCEDURE:  Left total hip replacement through an anterior approach   utilizing DePuy THR system, component size 52 mm pinnacle cup, a size 36+4 neutral   Altrex liner, a size 5 standard Actis stem with a 36+1.5 Articuleze metal head ball.      SURGEON:  Pietro Cassis. Alvan Dame, M.D.      ASSISTANT:  Malachy Chamber, PA-C     ANESTHESIA:  Spinal.      SPECIMENS:  None.      COMPLICATIONS:  None.      BLOOD LOSS:  350 cc     DRAINS:  None.      INDICATION OF THE PROCEDURE:  Miranda Garcia is a 86 y.o. female who had   presented to office for evaluation of left hip pain.  Radiographs revealed   progressive degenerative changes with bone-on-bone   articulation of the  hip joint, including subchondral cystic changes and osteophytes.  The patient had painful limited range of   motion significantly affecting their overall quality of life and function.  The patient was failing to    respond to conservative measures including medications and/or injections and activity modification and at this point was ready   to proceed with more definitive measures.  Consent was obtained for   benefit of pain relief.  Specific risks of infection, DVT, component   failure, dislocation, neurovascular injury, and need for revision surgery were reviewed in the office as well discussion of   the anterior versus posterior approach were reviewed.     PROCEDURE IN DETAIL:  The patient was brought to operative theater.   Once adequate anesthesia, preoperative antibiotics, 2 gm of Ancef, 1 gm of Tranexamic  Acid, and 10 mg of Decadron were administered, the patient was positioned supine on the [employer] table.  Once the patient was safely positioned with adequate padding of boney prominences we predraped out the hip, and used fluoroscopy to confirm orientation of the pelvis.      The left hip was then prepped and draped from proximal iliac crest to   mid thigh with a shower curtain technique.      Time-out was performed identifying the patient, planned procedure, and the appropriate extremity.     An incision was then made 2 cm lateral to the   anterior superior iliac spine extending over the orientation of the   tensor fascia lata muscle and sharp dissection was carried down to the   fascia of the muscle.      The fascia was then incised.  The muscle belly was identified and swept   laterally and retractor placed along the superior neck.  Following   cauterization of the circumflex vessels and removing some pericapsular  fat, a second cobra retractor was placed on the inferior neck.  A T-capsulotomy was made along the line of the   superior neck to the trochanteric fossa, then extended proximally and   distally.  Tag sutures were placed and the retractors were then placed   intracapsular.  We then identified the trochanteric fossa and   orientation of my neck cut and then made a neck osteotomy with the femur on traction.  The femoral   head was removed without difficulty or complication.  Traction was let   off and retractors were placed posterior and anterior around the   acetabulum.      The labrum and foveal tissue were debrided.  I began reaming with a 44 mm   reamer and reamed up to 51 mm reamer with good bony bed preparation and a 52 mm  cup was chosen.  The final 52 mm Pinnacle cup was then impacted under fluoroscopy to confirm the depth of penetration and orientation with respect to   Abduction and forward flexion.  A screw was placed into the ilium followed by the hole eliminator.   The final   36+4 neutral Altrex liner was impacted with good visualized rim fit.  The cup was positioned anatomically within the acetabular portion of the pelvis.      At this point, the femur was rolled to 100 degrees.  Further capsule was   released off the inferior aspect of the femoral neck.  I then   released the superior capsule proximally.  With the leg in a neutral position the hook was placed laterally   along the femur under the vastus lateralis origin and elevated manually and then held in position using the hook attachment on the bed.  The leg was then extended and adducted with the leg rolled to 100   degrees of external rotation.  Retractors were placed along the medial calcar and posteriorly over the greater trochanter.  Once the proximal femur was fully   exposed, I used a box osteotome to set orientation.  I then began   broaching with the starting chili pepper broach and passed this by hand and then broached up to 5.  With the 5 broach in place I chose a standard neck and did several trial reductions.  The offset was appropriate, leg lengths   appeared to be equal best matched with the +1.5 head ball trial confirmed radiographically.   Given these findings, I went ahead and dislocated the hip, repositioned all   retractors and positioned the right hip in the extended and abducted position.  The final 5 standard Actis stem was   chosen and it was impacted down to the level of neck cut.  Based on this   and the trial reductions, a final 36+1.5 Articuleze metal head ball was chosen and   impacted onto a clean and dry trunnion, and the hip was reduced.  The   hip had been irrigated throughout the case again at this point.  I did   reapproximate the superior capsular leaflet to the anterior leaflet   using #1 Vicryl.  The fascia of the   tensor fascia lata muscle was then reapproximated using #1 Vicryl and #0 Stratafix sutures.  The   remaining wound was closed with 2-0 Vicryl and  running 4-0 Monocryl.   The hip was cleaned, dried, and dressed sterilely using Dermabond and   Aquacel dressing.  The patient was then brought   to recovery room in  stable condition tolerating the procedure well.    Griffith Citron, PA-C was present for the entirety of the case involved from   preoperative positioning, perioperative retractor management, general   facilitation of the case, as well as primary wound closure as assistant.            Pietro Cassis Alvan Dame, M.D.        09/11/2019 1:55 PM

## 2019-09-11 NOTE — Discharge Instructions (Signed)
INSTRUCTIONS AFTER JOINT REPLACEMENT   o Remove items at home which could result in a fall. This includes throw rugs or furniture in walking pathways o ICE to the affected joint every three hours while awake for 30 minutes at a time, for at least the first 3-5 days, and then as needed for pain and swelling.  Continue to use ice for pain and swelling. You may notice swelling that will progress down to the foot and ankle.  This is normal after surgery.  Elevate your leg when you are not up walking on it.   o Continue to use the breathing machine you got in the hospital (incentive spirometer) which will help keep your temperature down.  It is common for your temperature to cycle up and down following surgery, especially at night when you are not up moving around and exerting yourself.  The breathing machine keeps your lungs expanded and your temperature down.   DIET:  As you were doing prior to hospitalization, we recommend a well-balanced diet.  DRESSING / WOUND CARE / SHOWERING  Keep the surgical dressing until follow up.  The dressing is water proof, so you can shower without any extra covering.  IF THE DRESSING FALLS OFF or the wound gets wet inside, change the dressing with sterile gauze.  Please use good hand washing techniques before changing the dressing.  Do not use any lotions or creams on the incision until instructed by your surgeon.    ACTIVITY  o Increase activity slowly as tolerated, but follow the weight bearing instructions below.   o No driving for 6 weeks or until further direction given by your physician.  You cannot drive while taking narcotics.  o No lifting or carrying greater than 10 lbs. until further directed by your surgeon. o Avoid periods of inactivity such as sitting longer than an hour when not asleep. This helps prevent blood clots.  o You may return to work once you are authorized by your doctor.     WEIGHT BEARING   Weight bearing as tolerated with assist  device (walker, cane, etc) as directed, use it as long as suggested by your surgeon or therapist, typically at least 4-6 weeks.   EXERCISES  Results after joint replacement surgery are often greatly improved when you follow the exercise, range of motion and muscle strengthening exercises prescribed by your doctor. Safety measures are also important to protect the joint from further injury. Any time any of these exercises cause you to have increased pain or swelling, decrease what you are doing until you are comfortable again and then slowly increase them. If you have problems or questions, call your caregiver or physical therapist for advice.   Rehabilitation is important following a joint replacement. After just a few days of immobilization, the muscles of the leg can become weakened and shrink (atrophy).  These exercises are designed to build up the tone and strength of the thigh and leg muscles and to improve motion. Often times heat used for twenty to thirty minutes before working out will loosen up your tissues and help with improving the range of motion but do not use heat for the first two weeks following surgery (sometimes heat can increase post-operative swelling).   These exercises can be done on a training (exercise) mat, on the floor, on a table or on a bed. Use whatever works the best and is most comfortable for you.    Use music or television while you are exercising so that   the exercises are a pleasant break in your day. This will make your life better with the exercises acting as a break in your routine that you can look forward to.   Perform all exercises about fifteen times, three times per day or as directed.  You should exercise both the operative leg and the other leg as well.  Exercises include:   . Quad Sets - Tighten up the muscle on the front of the thigh (Quad) and hold for 5-10 seconds.   . Straight Leg Raises - With your knee straight (if you were given a brace, keep it on),  lift the leg to 60 degrees, hold for 3 seconds, and slowly lower the leg.  Perform this exercise against resistance later as your leg gets stronger.  . Leg Slides: Lying on your back, slowly slide your foot toward your buttocks, bending your knee up off the floor (only go as far as is comfortable). Then slowly slide your foot back down until your leg is flat on the floor again.  . Angel Wings: Lying on your back spread your legs to the side as far apart as you can without causing discomfort.  . Hamstring Strength:  Lying on your back, push your heel against the floor with your leg straight by tightening up the muscles of your buttocks.  Repeat, but this time bend your knee to a comfortable angle, and push your heel against the floor.  You may put a pillow under the heel to make it more comfortable if necessary.   A rehabilitation program following joint replacement surgery can speed recovery and prevent re-injury in the future due to weakened muscles. Contact your doctor or a physical therapist for more information on knee rehabilitation.    CONSTIPATION  Constipation is defined medically as fewer than three stools per week and severe constipation as less than one stool per week.  Even if you have a regular bowel pattern at home, your normal regimen is likely to be disrupted due to multiple reasons following surgery.  Combination of anesthesia, postoperative narcotics, change in appetite and fluid intake all can affect your bowels.   YOU MUST use at least one of the following options; they are listed in order of increasing strength to get the job done.  They are all available over the counter, and you may need to use some, POSSIBLY even all of these options:    Drink plenty of fluids (prune juice may be helpful) and high fiber foods Colace 100 mg by mouth twice a day  Senokot for constipation as directed and as needed Dulcolax (bisacodyl), take with full glass of water  Miralax (polyethylene glycol)  once or twice a day as needed.  If you have tried all these things and are unable to have a bowel movement in the first 3-4 days after surgery call either your surgeon or your primary doctor.    If you experience loose stools or diarrhea, hold the medications until you stool forms back up.  If your symptoms do not get better within 1 week or if they get worse, check with your doctor.  If you experience "the worst abdominal pain ever" or develop nausea or vomiting, please contact the office immediately for further recommendations for treatment.   ITCHING:  If you experience itching with your medications, try taking only a single pain pill, or even half a pain pill at a time.  You can also use Benadryl over the counter for itching or also to   help with sleep.   TED HOSE STOCKINGS:  Use stockings on both legs until for at least 2 weeks or as directed by physician office. They may be removed at night for sleeping.  MEDICATIONS:  See your medication summary on the "After Visit Summary" that nursing will review with you.  You may have some home medications which will be placed on hold until you complete the course of blood thinner medication.  It is important for you to complete the blood thinner medication as prescribed.  PRECAUTIONS:  If you experience chest pain or shortness of breath - call 911 immediately for transfer to the hospital emergency department.   If you develop a fever greater that 101 F, purulent drainage from wound, increased redness or drainage from wound, foul odor from the wound/dressing, or calf pain - CONTACT YOUR SURGEON.                                                   FOLLOW-UP APPOINTMENTS:  If you do not already have a post-op appointment, please call the office for an appointment to be seen by your surgeon.  Guidelines for how soon to be seen are listed in your "After Visit Summary", but are typically between 1-4 weeks after surgery.  OTHER INSTRUCTIONS:   Knee  Replacement:  Do not place pillow under knee, focus on keeping the knee straight while resting. CPM instructions: 0-90 degrees, 2 hours in the morning, 2 hours in the afternoon, and 2 hours in the evening. Place foam block, curve side up under heel at all times except when in CPM or when walking.  DO NOT modify, tear, cut, or change the foam block in any way.   DENTAL ANTIBIOTICS:  In most cases prophylactic antibiotics for Dental procdeures after total joint surgery are not necessary.  Exceptions are as follows:  1. History of prior total joint infection  2. Severely immunocompromised (Organ Transplant, cancer chemotherapy, Rheumatoid biologic meds such as [place])  3. Poorly controlled diabetes (A1C &gt; 8.0, blood glucose over 200)  If you have one of these conditions, contact your surgeon for an antibiotic prescription, prior to your dental procedure.   MAKE SURE YOU:  . Understand these instructions.  . Get help right away if you are not doing well or get worse.    Thank you for letting us be a part of your medical care team.  It is a privilege we respect greatly.  We hope these instructions will help you stay on track for a fast and full recovery!   Information on my medicine - ELIQUIS (apixaban)  This medication education was reviewed with me or my healthcare representative as part of my discharge preparation.  The pharmacist that spoke with me during my hospital stay was:  Why was Eliquis prescribed for you? Eliquis was prescribed for you to reduce the risk of blood clots forming after orthopedic surgery.    What do You need to know about Eliquis? Take your Eliquis TWICE DAILY - one tablet in the morning and one tablet in the evening with or without food.  It would be best to take the dose about the same time each day.  If you have difficulty swallowing the tablet whole please discuss with your pharmacist how to take the medication safely.  Take Eliquis exactly as  prescribed by your  doctor and DO NOT stop taking Eliquis without talking to the doctor who prescribed the medication.  Stopping without other medication to take the place of Eliquis may increase your risk of developing a clot.  After discharge, you should have regular check-up appointments with your healthcare provider that is prescribing your Eliquis.  What do you do if you miss a dose? If a dose of ELIQUIS is not taken at the scheduled time, take it as soon as possible on the same day and twice-daily administration should be resumed.  The dose should not be doubled to make up for a missed dose.  Do not take more than one tablet of ELIQUIS at the same time.  Important Safety Information A possible side effect of Eliquis is bleeding. You should call your healthcare provider right away if you experience any of the following: ? Bleeding from an injury or your nose that does not stop. ? Unusual colored urine (red or dark brown) or unusual colored stools (red or black). ? Unusual bruising for unknown reasons. ? A serious fall or if you hit your head (even if there is no bleeding).  Some medicines may interact with Eliquis and might increase your risk of bleeding or clotting while on Eliquis. To help avoid this, consult your healthcare provider or pharmacist prior to using any new prescription or non-prescription medications, including herbals, vitamins, non-steroidal anti-inflammatory drugs (NSAIDs) and supplements.  This website has more information on Eliquis (apixaban): http://www.eliquis.com/eliquis/home

## 2019-09-11 NOTE — Anesthesia Procedure Notes (Signed)
Procedure Name: MAC Date/Time: 09/11/2019 3:03 PM Performed by: Niel Hummer, CRNA Pre-anesthesia Checklist: Patient identified, Emergency Drugs available, Suction available and Patient being monitored Oxygen Delivery Method: Simple face mask

## 2019-09-11 NOTE — Anesthesia Postprocedure Evaluation (Signed)
Anesthesia Post Note  Patient: JULY LINAM  Procedure(s) Performed: TOTAL HIP ARTHROPLASTY ANTERIOR APPROACH (Left Hip)     Patient location during evaluation: PACU Anesthesia Type: Spinal Level of consciousness: awake and alert and oriented Pain management: pain level controlled Vital Signs Assessment: post-procedure vital signs reviewed and stable Respiratory status: spontaneous breathing, respiratory function stable and nonlabored ventilation Cardiovascular status: blood pressure returned to baseline and stable Postop Assessment: no headache, no backache, no apparent nausea or vomiting, spinal receding and patient able to bend at knees Anesthetic complications: no   No complications documented.  Last Vitals:  Vitals:   09/11/19 1715 09/11/19 1730  BP: (!) 142/81   Pulse: (!) 119 61  Resp: 15 16  Temp:    SpO2: 98% 100%    Last Pain:  Vitals:   09/11/19 1730  TempSrc:   PainSc: 0-No pain    LLE Motor Response: Non-purposeful movement (09/11/19 1730) LLE Sensation: Increased (09/11/19 1730)     L Sensory Level: L3-Anterior knee, lower leg (09/11/19 1730)    Samreet [place]field A.

## 2019-09-11 NOTE — Anesthesia Procedure Notes (Signed)
Spinal  Patient location during procedure: OR Start time: 09/11/2019 3:07 PM End time: 09/11/2019 3:13 PM Staffing Performed: anesthesiologist  Anesthesiologist: Josephine Igo, MD Preanesthetic Checklist Completed: patient identified, IV checked, risks and benefits discussed, surgical consent, monitors and equipment checked and pre-op evaluation Spinal Block Patient position: sitting Prep: DuraPrep Patient monitoring: heart rate, continuous pulse ox and blood pressure Approach: midline Location: L3-4 Injection technique: single-shot Needle Needle type: Pencan  Needle gauge: 24 G Needle length: 9 cm Additional Notes Spinal attempt by CRNA, unsuccessful. Dr. Royce Macadamia placed spinal a level up. CRNA injected bupivacaine.

## 2019-09-12 ENCOUNTER — Encounter (HOSPITAL_COMMUNITY): Payer: Self-pay | Admitting: Orthopedic Surgery

## 2019-09-12 DIAGNOSIS — E669 Obesity, unspecified: Secondary | ICD-10-CM | POA: Diagnosis present

## 2019-09-12 DIAGNOSIS — M1612 Unilateral primary osteoarthritis, left hip: Secondary | ICD-10-CM | POA: Diagnosis not present

## 2019-09-12 LAB — BASIC METABOLIC PANEL
Anion gap: 9 (ref 5–15)
BUN: 42 mg/dL — ABNORMAL HIGH (ref 8–23)
CO2: 26 mmol/L (ref 22–32)
Calcium: 7.9 mg/dL — ABNORMAL LOW (ref 8.9–10.3)
Chloride: 101 mmol/L (ref 98–111)
Creatinine, Ser: 2.2 mg/dL — ABNORMAL HIGH (ref 0.44–1.00)
GFR calc Af Amer: 23 mL/min — ABNORMAL LOW (ref >60)
GFR calc non Af Amer: 20 mL/min — ABNORMAL LOW (ref >60)
Glucose, Bld: 279 mg/dL — ABNORMAL HIGH (ref 70–99)
Potassium: 4.3 mmol/L (ref 3.5–5.1)
Sodium: 136 mmol/L (ref 135–145)

## 2019-09-12 LAB — CBC
HCT: 34.4 % — ABNORMAL LOW (ref 36.0–46.0)
Hemoglobin: 11.1 g/dL — ABNORMAL LOW (ref 12.0–15.0)
MCH: 31 pg (ref 26.0–34.0)
MCHC: 32.3 g/dL (ref 30.0–36.0)
MCV: 96.1 fL (ref 80.0–100.0)
Platelets: 144 10*3/uL — ABNORMAL LOW (ref 150–400)
RBC: 3.58 MIL/uL — ABNORMAL LOW (ref 3.87–5.11)
RDW: 12.4 % (ref 11.5–15.5)
WBC: 7.9 10*3/uL (ref 4.0–10.5)
nRBC: 0 % (ref 0.0–0.2)

## 2019-09-12 LAB — GLUCOSE, CAPILLARY
Glucose-Capillary: 210 mg/dL — ABNORMAL HIGH (ref 70–99)
Glucose-Capillary: 340 mg/dL — ABNORMAL HIGH (ref 70–99)

## 2019-09-12 MED ORDER — METHOCARBAMOL 500 MG PO TABS: 500.0000 mg | ORAL_TABLET | Freq: Four times a day (QID) | ORAL | 0 refills | Status: DC | PRN

## 2019-09-12 MED ORDER — HYDROCODONE-ACETAMINOPHEN 5-325 MG PO TABS: 1.0000 | ORAL_TABLET | Freq: Four times a day (QID) | ORAL | 0 refills | Status: DC | PRN

## 2019-09-12 MED ORDER — POLYETHYLENE GLYCOL 3350 17 G PO PACK
17.0000 g | PACK | Freq: Two times a day (BID) | ORAL | 0 refills | Status: DC
Start: 2019-09-12 — End: 2020-02-18

## 2019-09-12 MED ORDER — DOCUSATE SODIUM 100 MG PO CAPS
100.0000 mg | ORAL_CAPSULE | Freq: Two times a day (BID) | ORAL | 0 refills | Status: DC
Start: 2019-09-12 — End: 2020-02-18

## 2019-09-12 MED ORDER — FERROUS SULFATE 325 (65 FE) MG PO TABS: 325.0000 mg | ORAL_TABLET | Freq: Three times a day (TID) | ORAL | 0 refills | Status: DC

## 2019-09-12 NOTE — TOC Transition Note (Signed)
Transition of Care [facility]) - CM/SW Discharge Note   Patient Details  Name: Miranda Garcia MRN: [id number] Date of Birth: 03/26/1930  [facility] CM/SW Contact:  Lia Hopping, [place] Phone Number: 09/12/2019, 11:50 AM   Clinical Narrative:    Therapy Plan: HEP Patient confirm she has RW and 3 in1    Final next level of care: Home/Self Care (HEP)     Patient Goals and CMS Choice     Choice offered to / list presented to : NA  Discharge Placement                       Discharge Plan and Services                DME Arranged: N/A DME Agency: NA       HH Arranged: NA HH Agency: NA        Social Determinants of Health (SDOH) Interventions     Readmission Risk Interventions No flowsheet data found.

## 2019-09-12 NOTE — Progress Notes (Signed)
Patient discharged to home w/ dtr. Given all belongings, instructions. Verbalized understanding of instructions. Escorted to pov via w/c. 

## 2019-09-12 NOTE — Discharge Summary (Signed)
Patient ID: Miranda Garcia MRN: [id number] DOB/AGE: 10/21/1930 86 y.o.  Admit date: 09/11/2019 Discharge date: 09/12/2019  Admission Diagnoses:  Primary Problem: Left hip OA Active Problems:   S/P left THA   Obese   Discharge Diagnoses:  Same  Past Medical History:  Diagnosis Date  . Arthritis    back, knees  . Atrial fibrillation ([place])   . CHF (congestive heart failure) ([place]) 08/2018  . Diabetes mellitus type II, controlled ([place])    x 15 yrs  . Dysrhythmia    A-fib  . Elevated troponin   . Hemorrhoids   . History of kidney stones   . Hypertension   . Neuromuscular disorder ([facility])    numbness in hands   . Renal insufficiency    stage iv    Surgeries: Procedure(s):  LEFT TOTAL HIP ARTHROPLASTY ANTERIOR APPROACH on 09/11/2019   Consultants: N/A  Discharged Condition: Improved  Hospital Course: Miranda Garcia is an 86 y.o. female who was admitted 09/11/2019 for operative treatment of left hip OA. Patient has severe unremitting pain that affects sleep, daily activities, and work/hobbies. After pre-op clearance the patient was taken to the operating room on 09/11/2019 and underwent  Procedure(s):  LEFT TOTAL HIP ARTHROPLASTY ANTERIOR APPROACH.    Patient was given perioperative antibiotics:  Anti-infectives (From admission, onward)   Start     Dose/Rate Route Frequency Ordered Stop   09/11/19 2100  ceFAZolin (ANCEF) IVPB 2g/100 mL premix        2 g 200 mL/hr over 30 Minutes Intravenous Every 6 hours 09/11/19 1828 09/12/19 0554   09/11/19 1300  ceFAZolin (ANCEF) IVPB 2g/100 mL premix        2 g 200 mL/hr over 30 Minutes Intravenous On call to O.R. 09/11/19 1245 09/11/19 1540       Patient was given sequential compression devices, early ambulation, and chemoprophylaxis to prevent DVT.  Patient benefited maximally from hospital stay and there were no complications.    Recent vital signs:  Patient Vitals for the past 24 hrs:  BP Temp Temp src Pulse Resp SpO2  Height Weight  09/12/19 0549 (!) 160/94 98.1 F (36.7 C) Oral 88 -- 99 % -- --  09/12/19 0158 (!) 142/78 98.1 F (36.7 C) Oral 94 -- 99 % -- --  09/11/19 2252 (!) 153/79 97.9 F (36.6 C) Oral 100 -- 99 % -- --  09/11/19 2148 (!) 164/89 97.8 F (36.6 C) Oral 87 -- 98 % -- --  09/11/19 1824 (!) 170/87 -- -- 72 14 99 % -- --  09/11/19 1800 (!) 150/77 (!) 97.5 F (36.4 C) -- 65 15 100 % -- --  09/11/19 1745 (!) 150/75 -- -- 74 14 100 % -- --  09/11/19 1730 (!) 147/78 -- -- 61 16 100 % -- --  09/11/19 1715 (!) 142/81 -- -- (!) 119 15 98 % -- --  09/11/19 1700 127/65 97.6 F (36.4 C) -- 68 14 100 % -- --  09/11/19 1314 -- -- -- -- -- -- 5\' 3"  (1.6 m) 79.4 kg  09/11/19 1304 (!) 180/70 97.8 F (36.6 C) Oral 61 18 98 % -- --     Recent laboratory studies:  Recent Labs    09/12/19 0301  WBC 7.9  HGB 11.1*  HCT 34.4*  PLT 144*  NA 136  K 4.3  CL 101  CO2 26  BUN 42*  CREATININE 2.20*  GLUCOSE 279*  CALCIUM 7.9*     Discharge Medications:  Allergies as of 09/12/2019      Reactions   Atorvastatin Other (See Comments)   Leg pains   Contrast Media [iodinated Diagnostic Agents]    Pt has 1 full functioning kidney   Penicillins Nausea And Vomiting, Rash   Tolerated Cephalosporin Date: 09/11/19. Did it involve swelling of the face/tongue/throat, SOB, or low BP? No Did it involve sudden or severe rash/hives, skin peeling, or any reaction on the inside of your mouth or nose? Yes Did you need to seek medical attention at a hospital or doctor's office? Yes When did it last happen?65 yrs ago If all above answers are "NO", may proceed with cephalosporin use.      Medication List    TAKE these medications   Caltrate 600+D Plus Minerals 600-800 MG-UNIT Tabs Take 1 tablet by mouth daily.   carvedilol 6.25 MG tablet Commonly known as: COREG Take 6.25 mg by mouth 2 (two) times daily.   diltiazem 180 MG 24 hr capsule Commonly known as: CARDIZEM CD TAKE ONE (1) CAPSULE  EACH DAY What changed: See the new instructions.   docusate sodium 100 MG capsule Commonly known as: Colace Take 1 capsule (100 mg total) by mouth 2 (two) times daily.   Eliquis 2.5 MG Tabs tablet Generic drug: apixaban TAKE ONE TABLET BY MOUTH TWICE DAILY What changed: how much to take   ferrous sulfate 325 (65 FE) MG tablet Commonly known as: FerrouSul Take 1 tablet (325 mg total) by mouth 3 (three) times daily with meals for 14 days.   furosemide 20 MG tablet Commonly known as: LASIX Take 20 mg by mouth daily.   furosemide 40 MG tablet Commonly known as: LASIX TAKE 1 TABLET DAILY AS NEEDED FOR SWELLING   HYDROcodone-acetaminophen 5-325 MG tablet Commonly known as: Norco Take 1-2 tablets by mouth every 6 (six) hours as needed for moderate pain or severe pain.   insulin aspart protamine- aspart (70-30) 100 UNIT/ML injection Commonly known as: NovoLOG Mix 70/30 Inject 0.12 mLs (12 Units total) into the skin 2 (two) times a day. What changed: how much to take   methocarbamol 500 MG tablet Commonly known as: Robaxin Take 1 tablet (500 mg total) by mouth every 6 (six) hours as needed for muscle spasms.   polyethylene glycol 17 g packet Commonly known as: MIRALAX / GLYCOLAX Take 17 g by mouth 2 (two) times daily.   pravastatin 20 MG tablet Commonly known as: PRAVACHOL TAKE 1 TABLET DAILY IN THE EVENING   PreserVision AREDS Caps Take 2 capsules by mouth daily.   Vitamin D3 10 MCG (400 UNIT) tablet Take 400 Units by mouth daily.            Discharge Care Instructions  (From admission, onward)         Start     Ordered   09/12/19 0000  Change dressing       Comments: Maintain surgical dressing until follow up in the clinic. If the edges start to pull up, may reinforce with tape. If the dressing is no longer working, may remove and cover with gauze and tape, but must keep the area dry and clean.  Call with any questions or concerns.   09/12/19 0842           Diagnostic Studies: DG Pelvis Portable  Result Date: 09/11/2019 CLINICAL DATA:  Postop EXAM: PORTABLE PELVIS 1-2 VIEWS COMPARISON:  None. FINDINGS: Pubic symphysis and rami are intact. Mild degenerative change of the right hip. Amorphous calcifications along  the right inferior hip joint, question loose bodies. Status post left hip replacement with intact hardware and normal alignment. Gas in the soft tissues consistent with recent surgery. IMPRESSION: Status post left hip replacement with expected postsurgical changes. Electronically Signed   By: Donavan Foil M.D.   On: 09/11/2019 17:52   DG C-Arm 1-60 Min-No Report  Result Date: 09/11/2019 Fluoroscopy was utilized by the requesting physician.  No radiographic interpretation.   DG HIP OPERATIVE UNILAT W OR W/O PELVIS LEFT  Result Date: 09/11/2019 CLINICAL DATA:  Hip replacement EXAM: OPERATIVE left HIP (WITH PELVIS IF PERFORMED) 2 VIEWS TECHNIQUE: Fluoroscopic spot image(s) were submitted for interpretation post-operatively. COMPARISON:  None. FINDINGS: Two low resolution intraoperative spot views of the left hip. Total fluoroscopy time was 12 seconds. Images were obtained during operative placement of left hip replacement. IMPRESSION: Intraoperative fluoroscopic assistance provided during left hip replacement surgery Electronically Signed   By: Donavan Foil M.D.   On: 09/11/2019 17:52    Disposition: Discharge disposition: 01-Home or Self Care       Discharge Instructions    Call MD / Call 911   Complete by: As directed    If you experience chest pain or shortness of breath, CALL 911 and be transported to the hospital emergency room.  If you develope a fever above 101 F, pus (white drainage) or increased drainage or redness at the wound, or calf pain, call your surgeon's office.   Change dressing   Complete by: As directed    Maintain surgical dressing until follow up in the clinic. If the edges start to pull up, may reinforce with  tape. If the dressing is no longer working, may remove and cover with gauze and tape, but must keep the area dry and clean.  Call with any questions or concerns.   Constipation Prevention   Complete by: As directed    Drink plenty of fluids.  Prune juice may be helpful.  You may use a stool softener, such as Colace (over the counter) 100 mg twice a day.  Use MiraLax (over the counter) for constipation as needed.   Diet - low sodium heart healthy   Complete by: As directed    Discharge instructions   Complete by: As directed    Maintain surgical dressing until follow up in the clinic. If the edges start to pull up, may reinforce with tape. If the dressing is no longer working, may remove and cover with gauze and tape, but must keep the area dry and clean.  Follow up in 2 weeks at [facility]. Call with any questions or concerns.   Increase activity slowly as tolerated   Complete by: As directed    Weight bearing as tolerated with assist device (walker, cane, etc) as directed, use it as long as suggested by your surgeon or therapist, typically at least 4-6 weeks.   TED hose   Complete by: As directed    Use stockings (TED hose) for 2 weeks on both leg(s).  You may remove them at night for sleeping.       Follow-up Information    Paralee Cancel, MD. Schedule an appointment as soon as possible for a visit in 2 weeks.   Specialty: Orthopedic Surgery Contact information: [address] [place] [address] [phone]                Signed: Lucille Passy [facility] 09/12/2019, 8:43 AM

## 2019-09-12 NOTE — Evaluation (Addendum)
Physical Therapy Evaluation Patient Details Name: Miranda Garcia MRN: [id number] DOB: 1930/04/20 Today's Date: 09/12/2019   History of Present Illness  86 yo female s/p L DA THA. PMH: DM, afib, CHF, HTN, renal insufficiency  Clinical Impression  Pt is s/p THA resulting in the deficits listed below (see PT Problem List).  Pt doing well today, reviewed transfer/gait safety, and THA HEP. Will see again this pm and pt should be ready to d/c later today   Pt will benefit from skilled PT to increase their independence and safety with mobility to allow discharge to the venue listed below.      Follow Up Recommendations Follow surgeon's recommendation for DC plan and follow-up therapies    Equipment Recommendations  None recommended by PT    Recommendations for Other Services       Precautions / Restrictions Precautions Precautions: Fall Restrictions Weight Bearing Restrictions: No Other Position/Activity Restrictions:       Mobility  Bed Mobility               General bed mobility comments: pt in chair  Transfers Overall transfer level: Needs assistance Equipment used: Rolling walker (2 wheeled) Transfers: Sit to/from Stand Sit to Stand: Min assist         General transfer comment: cues for hand placement, and safety  Ambulation/Gait Ambulation/Gait assistance: Min guard Gait Distance (Feet): 80 Feet Assistive device: Rolling walker (2 wheeled) Gait Pattern/deviations: Step-to pattern     General Gait Details: cues for sequence, RW position  Stairs            Wheelchair Mobility    Modified Rankin (Stroke Patients Only)       Balance                                             Pertinent Vitals/Pain Pain Assessment: Faces Pain Score: 3  Pain Location: left hip Pain Descriptors / Indicators: Aching;Grimacing;Sore Pain Intervention(s): Limited activity within patient's tolerance;Monitored during  session;Repositioned;Premedicated before session;Ice applied    Home Living Family/patient expects to be discharged to:: Private residence Living Arrangements: Children;Other relatives Available Help at Discharge: Family Type of Home: House Home Access: Stairs to enter Entrance Stairs-Rails: Right;Left;Can reach both Entrance Stairs-Number of Steps: 4 Home Layout: One level Home Equipment: [place] - 2 wheels;Bedside commode;Shower seat      Prior Function Level of Independence: Independent               Hand Dominance        Extremity/Trunk Assessment   Upper Extremity Assessment Upper Extremity Assessment: Overall WFL for tasks assessed    Lower Extremity Assessment Lower Extremity Assessment: LLE deficits/detail LLE Deficits / Details: ankle WFL, knee and hip grossly 2+/5, limited by post op pain/weakness       Communication   Communication: No difficulties  Cognition Arousal/Alertness: Awake/alert Behavior During Therapy: WFL for tasks assessed/performed Overall Cognitive Status: Within Functional Limits for tasks assessed                                        General Comments      Exercises Total Joint Exercises Ankle Circles/Pumps: AROM;Both;10 reps Quad Sets: 10 reps;Both;AROM Heel Slides: AROM;Left;10 reps;AAROM Hip ABduction/ADduction: AROM;Left;10 reps   Assessment/Plan    PT  Assessment Patient needs continued PT services  PT Problem List Decreased strength;Decreased range of motion;Decreased activity tolerance;Decreased balance;Decreased mobility;Pain       PT Treatment Interventions DME instruction;Therapeutic exercise;Gait training;Functional mobility training;Stair training;Therapeutic activities;Patient/family education    PT Goals (Current goals can be found in the Care Plan section)  Acute Rehab PT Goals Patient Stated Goal: home today PT Goal Formulation: With patient/family Time For Goal Achievement:  09/19/19 Potential to Achieve Goals: Good    Frequency 7X/week   Barriers to discharge        Co-evaluation               AM-PAC PT "6 Clicks" Mobility  Outcome Measure Help needed turning from your back to your side while in a flat bed without using bedrails?: A Little Help needed moving from lying on your back to sitting on the side of a flat bed without using bedrails?: A Little Help needed moving to and from a bed to a chair (including a wheelchair)?: A Little Help needed standing up from a chair using your arms (e.g., wheelchair or bedside chair)?: A Little Help needed to walk in hospital room?: A Little Help needed climbing 3-5 steps with a railing? : A Little 6 Click Score: 18    End of Session Equipment Utilized During Treatment: Gait belt Activity Tolerance: Patient tolerated treatment well Patient left: in chair;with call bell/phone within reach;with chair alarm set;with family/visitor present Nurse Communication: Mobility status PT Visit Diagnosis: Difficulty in walking, not elsewhere classified (R26.2)    Time: 1132-1150 PT Time Calculation (min) (ACUTE ONLY): 18 min   Charges:   PT Evaluation $PT Eval Low Complexity: [place], PT  Acute Rehab Dept ([place]) [phone] Pager [phone]  09/12/2019   [facility] 09/12/2019, 1:04 PM

## 2019-09-12 NOTE — Progress Notes (Signed)
   09/12/19 1400  PT Visit Information  Last PT Received On 09/12/19  Reviewed transfer safety, amb; instructed up/down  4 steps with  bil hand rails. Pt progressing very well. dtr present. Ready for d/c with family assist  Assistance Needed +1  History of Present Illness 86 yo female s/p L DA THA. PMH: DM, afib, CHF, HTN, renal insufficiency  Subjective Data  Patient Stated Goal home today  Precautions  Precautions Fall  Restrictions  Weight Bearing Restrictions No  Pain Assessment  Pain Assessment 0-10  Pain Score 2  Pain Location left hip  Pain Descriptors / Indicators Aching;Grimacing;Sore  Pain Intervention(s) Limited activity within patient's tolerance;Monitored during session;Premedicated before session;Repositioned;Other (comment) (RN gave muscle relaxer after PT)  Cognition  Arousal/Alertness Awake/alert  Behavior During Therapy WFL for tasks assessed/performed  Overall Cognitive Status Within Functional Limits for tasks assessed  Bed Mobility  General bed mobility comments pt in chair  Transfers  Overall transfer level Needs assistance  Equipment used Rolling walker (2 wheeled)  Transfers Sit to/from Stand  Sit to Stand Supervision  General transfer comment cues for hand placement, and safety  Ambulation/Gait  Ambulation/Gait assistance Supervision  Gait Distance (Feet) 60 Feet  Assistive device Rolling walker (2 wheeled)  Gait Pattern/deviations Step-to pattern;Decreased stance time - left  General Gait Details cues for sequence, RW position  Stairs Yes  Stairs assistance Min guard  Stair Management Two rails;Step to pattern;Forwards  Number of Stairs 4  General stair comments cues for sequence, min/guard for safety. dtr present   PT - End of Session  Equipment Utilized During Treatment Gait belt  Activity Tolerance Patient tolerated treatment well  Patient left in chair;with call bell/phone within reach;with chair alarm set;with family/visitor present  Nurse  Communication Mobility status   PT - Assessment/Plan  PT Plan Current plan remains appropriate  PT Visit Diagnosis Difficulty in walking, not elsewhere classified (R26.2)  PT Frequency (ACUTE ONLY) 7X/week  Follow Up Recommendations Follow surgeon's recommendation for DC plan and follow-up therapies  PT equipment None recommended by PT  AM-PAC PT "6 Clicks" Mobility Outcome Measure (Version 2)  Help needed turning from your back to your side while in a flat bed without using bedrails? 3  Help needed moving from lying on your back to sitting on the side of a flat bed without using bedrails? 3  Help needed moving to and from a bed to a chair (including a wheelchair)? 3  Help needed standing up from a chair using your arms (e.g., wheelchair or bedside chair)? 4  Help needed to walk in hospital room? 3  Help needed climbing 3-5 steps with a railing?  3  6 Click Score 19  Consider Recommendation of Discharge To: Home with [facility]  PT Goal Progression  Progress towards PT goals Progressing toward goals  Acute Rehab PT Goals  PT Goal Formulation With patient/family  Time For Goal Achievement 09/19/19  Potential to Achieve Goals Good  PT Time Calculation  PT Start Time (ACUTE ONLY) 1402  PT Stop Time (ACUTE ONLY) 1412  PT Time Calculation (min) (ACUTE ONLY) 10 min  PT General Charges  $$ ACUTE PT VISIT 1 Visit  PT Treatments  $Gait Training 8-22 mins

## 2019-09-12 NOTE — Progress Notes (Addendum)
     Subjective: 1 Day Post-Op Procedure(s) (LRB): TOTAL HIP ARTHROPLASTY ANTERIOR APPROACH (Left)   Patient reports pain as mild, pain controlled.  No reported events throughout the night.  Dr. Alvan Dame discussed the procedure, findings and expectations moving forward.  Discussed kidney function and patient states that she has know CKD.  Ready to be discharged home, if they do well with PT.  Follow up in the clinic in 2 weeks.  Knows to call with any questions or concerns.    Objective:   VITALS:   Vitals:   09/12/19 0158 09/12/19 0549  BP: (!) 142/78 (!) 160/94  Pulse: 94 88  Resp:    Temp: 98.1 F (36.7 C) 98.1 F (36.7 C)  SpO2: 99% 99%    Dorsiflexion/Plantar flexion intact Incision: dressing C/D/I No cellulitis present Compartment soft  LABS Recent Labs    09/12/19 0301  HGB 11.1*  HCT 34.4*  WBC 7.9  PLT 144*    Recent Labs    09/12/19 0301  NA 136  K 4.3  BUN 42*  CREATININE 2.20*  GLUCOSE 279*     Assessment/Plan: 1 Day Post-Op Procedure(s) (LRB): TOTAL HIP ARTHROPLASTY ANTERIOR APPROACH (Left) Foley cath d/c'ed Advance diet Up with therapy D/C IV fluids Discharge home Follow up in 2 weeks at [facility] Follow up with OLIN,Darik Massing D in 2 weeks.  Contact information:  [facility] [address], [address] [place] [phone]    Obese (BMI 30-39.9) Estimated body mass index is 31 kg/m as calculated from the following:   Height as of this encounter: 5\' 3"  (1.6 m).   Weight as of this encounter: 79.4 kg. Patient also counseled that weight may inhibit the healing process Patient counseled that losing weight will help with future health issues      Danae Orleans PA-C  [facility]  [place] [address], [place], [place] [address] Phone: [phone] [email or web address] Facebook  [employer]

## 2019-09-14 LAB — HEMOGLOBIN A1C
Hgb A1c MFr Bld: 6.1 % — ABNORMAL HIGH (ref 4.8–5.6)
Mean Plasma Glucose: 128 mg/dL

## 2019-09-19 ENCOUNTER — Other Ambulatory Visit: Payer: Self-pay | Admitting: Cardiology

## 2019-09-19 DIAGNOSIS — E1142 Type 2 diabetes mellitus with diabetic polyneuropathy: Secondary | ICD-10-CM | POA: Diagnosis not present

## 2019-09-19 DIAGNOSIS — E119 Type 2 diabetes mellitus without complications: Secondary | ICD-10-CM | POA: Diagnosis not present

## 2019-09-19 DIAGNOSIS — Z09 Encounter for follow-up examination after completed treatment for conditions other than malignant neoplasm: Secondary | ICD-10-CM | POA: Diagnosis not present

## 2019-09-19 DIAGNOSIS — E1165 Type 2 diabetes mellitus with hyperglycemia: Secondary | ICD-10-CM | POA: Diagnosis not present

## 2019-09-19 DIAGNOSIS — I10 Essential (primary) hypertension: Secondary | ICD-10-CM | POA: Diagnosis not present

## 2019-09-19 DIAGNOSIS — Z6831 Body mass index (BMI) 31.0-31.9, adult: Secondary | ICD-10-CM | POA: Diagnosis not present

## 2019-09-19 DIAGNOSIS — I4891 Unspecified atrial fibrillation: Secondary | ICD-10-CM | POA: Diagnosis not present

## 2019-09-19 DIAGNOSIS — Z96642 Presence of left artificial hip joint: Secondary | ICD-10-CM | POA: Diagnosis not present

## 2019-09-29 DIAGNOSIS — R69 Illness, unspecified: Secondary | ICD-10-CM | POA: Diagnosis not present

## 2019-10-08 DIAGNOSIS — Z0000 Encounter for general adult medical examination without abnormal findings: Secondary | ICD-10-CM | POA: Diagnosis not present

## 2019-10-08 DIAGNOSIS — Z6832 Body mass index (BMI) 32.0-32.9, adult: Secondary | ICD-10-CM | POA: Diagnosis not present

## 2019-10-08 DIAGNOSIS — I10 Essential (primary) hypertension: Secondary | ICD-10-CM | POA: Diagnosis not present

## 2019-10-08 DIAGNOSIS — Z1339 Encounter for screening examination for other mental health and behavioral disorders: Secondary | ICD-10-CM | POA: Diagnosis not present

## 2019-10-08 DIAGNOSIS — N2581 Secondary hyperparathyroidism of renal origin: Secondary | ICD-10-CM | POA: Diagnosis not present

## 2019-10-08 DIAGNOSIS — Z299 Encounter for prophylactic measures, unspecified: Secondary | ICD-10-CM | POA: Diagnosis not present

## 2019-10-08 DIAGNOSIS — E1165 Type 2 diabetes mellitus with hyperglycemia: Secondary | ICD-10-CM | POA: Diagnosis not present

## 2019-10-08 DIAGNOSIS — Z79899 Other long term (current) drug therapy: Secondary | ICD-10-CM | POA: Diagnosis not present

## 2019-10-08 DIAGNOSIS — Z1331 Encounter for screening for depression: Secondary | ICD-10-CM | POA: Diagnosis not present

## 2019-10-08 DIAGNOSIS — E7800 Pure hypercholesterolemia, unspecified: Secondary | ICD-10-CM | POA: Diagnosis not present

## 2019-10-08 DIAGNOSIS — Z7189 Other specified counseling: Secondary | ICD-10-CM | POA: Diagnosis not present

## 2019-10-19 DIAGNOSIS — I4891 Unspecified atrial fibrillation: Secondary | ICD-10-CM | POA: Diagnosis not present

## 2019-10-19 DIAGNOSIS — E119 Type 2 diabetes mellitus without complications: Secondary | ICD-10-CM | POA: Diagnosis not present

## 2019-10-19 DIAGNOSIS — I10 Essential (primary) hypertension: Secondary | ICD-10-CM | POA: Diagnosis not present

## 2019-10-22 DIAGNOSIS — D631 Anemia in chronic kidney disease: Secondary | ICD-10-CM | POA: Diagnosis not present

## 2019-10-22 DIAGNOSIS — N189 Chronic kidney disease, unspecified: Secondary | ICD-10-CM | POA: Diagnosis not present

## 2019-10-22 DIAGNOSIS — I129 Hypertensive chronic kidney disease with stage 1 through stage 4 chronic kidney disease, or unspecified chronic kidney disease: Secondary | ICD-10-CM | POA: Diagnosis not present

## 2019-10-22 DIAGNOSIS — N184 Chronic kidney disease, stage 4 (severe): Secondary | ICD-10-CM | POA: Diagnosis not present

## 2019-10-22 DIAGNOSIS — N2581 Secondary hyperparathyroidism of renal origin: Secondary | ICD-10-CM | POA: Diagnosis not present

## 2019-10-22 DIAGNOSIS — I5032 Chronic diastolic (congestive) heart failure: Secondary | ICD-10-CM | POA: Diagnosis not present

## 2019-10-31 DIAGNOSIS — Z96642 Presence of left artificial hip joint: Secondary | ICD-10-CM | POA: Diagnosis not present

## 2019-10-31 DIAGNOSIS — Z471 Aftercare following joint replacement surgery: Secondary | ICD-10-CM | POA: Diagnosis not present

## 2019-11-17 DIAGNOSIS — R69 Illness, unspecified: Secondary | ICD-10-CM | POA: Diagnosis not present

## 2019-11-20 DIAGNOSIS — I10 Essential (primary) hypertension: Secondary | ICD-10-CM | POA: Diagnosis not present

## 2019-11-29 DIAGNOSIS — R69 Illness, unspecified: Secondary | ICD-10-CM | POA: Diagnosis not present

## 2019-12-06 DIAGNOSIS — Z299 Encounter for prophylactic measures, unspecified: Secondary | ICD-10-CM | POA: Diagnosis not present

## 2019-12-06 DIAGNOSIS — I10 Essential (primary) hypertension: Secondary | ICD-10-CM | POA: Diagnosis not present

## 2019-12-06 DIAGNOSIS — I5032 Chronic diastolic (congestive) heart failure: Secondary | ICD-10-CM | POA: Diagnosis not present

## 2019-12-06 DIAGNOSIS — E1122 Type 2 diabetes mellitus with diabetic chronic kidney disease: Secondary | ICD-10-CM | POA: Diagnosis not present

## 2019-12-06 DIAGNOSIS — Z6832 Body mass index (BMI) 32.0-32.9, adult: Secondary | ICD-10-CM | POA: Diagnosis not present

## 2019-12-06 DIAGNOSIS — E1165 Type 2 diabetes mellitus with hyperglycemia: Secondary | ICD-10-CM | POA: Diagnosis not present

## 2019-12-06 DIAGNOSIS — N184 Chronic kidney disease, stage 4 (severe): Secondary | ICD-10-CM | POA: Diagnosis not present

## 2019-12-19 DIAGNOSIS — I10 Essential (primary) hypertension: Secondary | ICD-10-CM | POA: Diagnosis not present

## 2019-12-20 DIAGNOSIS — I10 Essential (primary) hypertension: Secondary | ICD-10-CM | POA: Diagnosis not present

## 2019-12-20 DIAGNOSIS — I4891 Unspecified atrial fibrillation: Secondary | ICD-10-CM | POA: Diagnosis not present

## 2019-12-20 DIAGNOSIS — E119 Type 2 diabetes mellitus without complications: Secondary | ICD-10-CM | POA: Diagnosis not present

## 2019-12-31 DIAGNOSIS — R69 Illness, unspecified: Secondary | ICD-10-CM | POA: Diagnosis not present

## 2020-01-01 DIAGNOSIS — I10 Essential (primary) hypertension: Secondary | ICD-10-CM | POA: Diagnosis not present

## 2020-01-01 DIAGNOSIS — Z299 Encounter for prophylactic measures, unspecified: Secondary | ICD-10-CM | POA: Diagnosis not present

## 2020-01-01 DIAGNOSIS — J329 Chronic sinusitis, unspecified: Secondary | ICD-10-CM | POA: Diagnosis not present

## 2020-01-01 DIAGNOSIS — E1122 Type 2 diabetes mellitus with diabetic chronic kidney disease: Secondary | ICD-10-CM | POA: Diagnosis not present

## 2020-01-18 DIAGNOSIS — I10 Essential (primary) hypertension: Secondary | ICD-10-CM | POA: Diagnosis not present

## 2020-01-18 DIAGNOSIS — E119 Type 2 diabetes mellitus without complications: Secondary | ICD-10-CM | POA: Diagnosis not present

## 2020-01-18 DIAGNOSIS — I4891 Unspecified atrial fibrillation: Secondary | ICD-10-CM | POA: Diagnosis not present

## 2020-01-19 DIAGNOSIS — I10 Essential (primary) hypertension: Secondary | ICD-10-CM | POA: Diagnosis not present

## 2020-01-29 DIAGNOSIS — E1122 Type 2 diabetes mellitus with diabetic chronic kidney disease: Secondary | ICD-10-CM | POA: Diagnosis not present

## 2020-01-29 DIAGNOSIS — J069 Acute upper respiratory infection, unspecified: Secondary | ICD-10-CM | POA: Diagnosis not present

## 2020-01-29 DIAGNOSIS — Z789 Other specified health status: Secondary | ICD-10-CM | POA: Diagnosis not present

## 2020-01-29 DIAGNOSIS — N184 Chronic kidney disease, stage 4 (severe): Secondary | ICD-10-CM | POA: Diagnosis not present

## 2020-01-29 DIAGNOSIS — E1165 Type 2 diabetes mellitus with hyperglycemia: Secondary | ICD-10-CM | POA: Diagnosis not present

## 2020-01-29 DIAGNOSIS — Z299 Encounter for prophylactic measures, unspecified: Secondary | ICD-10-CM | POA: Diagnosis not present

## 2020-02-07 DIAGNOSIS — H43812 Vitreous degeneration, left eye: Secondary | ICD-10-CM | POA: Diagnosis not present

## 2020-02-07 DIAGNOSIS — H43392 Other vitreous opacities, left eye: Secondary | ICD-10-CM | POA: Diagnosis not present

## 2020-02-07 DIAGNOSIS — H353132 Nonexudative age-related macular degeneration, bilateral, intermediate dry stage: Secondary | ICD-10-CM | POA: Diagnosis not present

## 2020-02-15 DIAGNOSIS — R69 Illness, unspecified: Secondary | ICD-10-CM | POA: Diagnosis not present

## 2020-02-18 ENCOUNTER — Encounter: Payer: Self-pay | Admitting: *Deleted

## 2020-02-18 ENCOUNTER — Encounter: Payer: Self-pay | Admitting: Cardiology

## 2020-02-18 ENCOUNTER — Other Ambulatory Visit: Payer: Self-pay

## 2020-02-18 ENCOUNTER — Ambulatory Visit: Payer: Medicare HMO | Admitting: Cardiology

## 2020-02-18 VITALS — BP 124/70 | HR 74 | Ht 64.0 in | Wt 178.0 lb

## 2020-02-18 DIAGNOSIS — I350 Nonrheumatic aortic (valve) stenosis: Secondary | ICD-10-CM | POA: Diagnosis not present

## 2020-02-18 DIAGNOSIS — I5032 Chronic diastolic (congestive) heart failure: Secondary | ICD-10-CM | POA: Diagnosis not present

## 2020-02-18 DIAGNOSIS — I10 Essential (primary) hypertension: Secondary | ICD-10-CM

## 2020-02-18 NOTE — Progress Notes (Signed)
Clinical Summary Ms. Younkin is a 86 y.o.female  seen today for follow up of the following medical problems.   1. Chronic diastolic HF - working to limit diuretic due to her renal dysfunction 04/2018 echo: LVEF60-65%, normal RV, evidence of diastolic dysfunction  06/5807 echo LVEF 60-65%, indet DDx, mod pulm HTN, severe LAE, mod MR, mod TR, mild to mod AS.    - no SOB or DOE. No recent edema.   2. CKDIV -followed by nephorlogyDr Hollie Salk  3. Dyspnea - extensive pulmonmary and cardiac workup over the last few months.  Jan 2020 PFTs suggestive of restriction, mildly redcued DLCO but corrects for alveolar ventilation  04/2018 echo: LVEF 60-65%, indeterminate diastolic function,reportedmoderate aortic stenosis - 05/2018 nuclear stress: no ischemia 05/2018 CT chest high resolution: findings suggesting of interstitial lung disease vs alveolar hemorrage.  05/2018 VQ scan: low probability PE,Patchy retention of the ventilation agent centrally may be seen with air trapping or COPD.  -no recent SOB/DOE   4.HTN -compliant with meds   5. Afib -denies any palpitations - no bleeding on eliquis.    6. Aortic stenosis -mild to moderate by 04/2018 echo - 04/2019 mild to mod AS  7. Chronic hip pain - followed by ortho - recent hip replacement 08/2019  COVID vaccine moderna x 2.   Past Medical History:  Diagnosis Date  . Arthritis    back, knees  . Atrial fibrillation ([place])   . CHF (congestive heart failure) ([place]) 08/2018  . Diabetes mellitus type II, controlled ([place])    x 15 yrs  . Dysrhythmia    A-fib  . Elevated troponin   . Hemorrhoids   . History of kidney stones   . Hypertension   . Neuromuscular disorder (HCC)    numbness in hands   . Renal insufficiency    stage iv     Allergies  Allergen Reactions  . Atorvastatin Other (See Comments)    Leg pains  . Contrast Media [Iodinated Diagnostic Agents]     Pt has 1 full functioning kidney   . Penicillins Nausea And Vomiting and Rash    Tolerated Cephalosporin Date: 09/11/19.    Did it involve swelling of the face/tongue/throat, SOB, or low BP? No Did it involve sudden or severe rash/hives, skin peeling, or any reaction on the inside of your mouth or nose? Yes Did you need to seek medical attention at a hospital or doctor's office? Yes When did it last happen?65 yrs ago If all above answers are "NO", may proceed with cephalosporin use.      Current Outpatient Medications  Medication Sig Dispense Refill  . Calcium Carbonate-Vit D-Min (CALTRATE 600+D PLUS MINERALS) 600-800 MG-UNIT TABS Take 1 tablet by mouth daily.    . carvedilol (COREG) 6.25 MG tablet Take 6.25 mg by mouth 2 (two) times daily.     . Cholecalciferol (VITAMIN D3) 10 MCG (400 UNIT) tablet Take 400 Units by mouth daily.    Marland Kitchen diltiazem (CARDIZEM CD) 180 MG 24 hr capsule TAKE ONE (1) CAPSULE EACH DAY (Patient taking differently: Take 180 mg by mouth daily. TAKE ONE (1) CAPSULE EACH DAY) 90 capsule 1  . docusate sodium (COLACE) 100 MG capsule Take 1 capsule (100 mg total) by mouth 2 (two) times daily. 28 capsule 0  . ELIQUIS 2.5 MG TABS tablet TAKE ONE TABLET BY MOUTH TWICE DAILY (Patient taking differently: Take 2.5 mg by mouth 2 (two) times daily. ) 60 tablet 6  . ferrous sulfate (FERROUSUL)  325 (65 FE) MG tablet Take 1 tablet (325 mg total) by mouth 3 (three) times daily with meals for 14 days. 42 tablet 0  . furosemide (LASIX) 20 MG tablet Take 20 mg by mouth daily.     . furosemide (LASIX) 40 MG tablet TAKE 1 TABLET DAILY AS NEEDED FOR SWELLING 90 tablet 1  . HYDROcodone-acetaminophen (NORCO) 5-325 MG tablet Take 1-2 tablets by mouth every 6 (six) hours as needed for moderate pain or severe pain. 40 tablet 0  . insulin aspart protamine- aspart (NOVOLOG MIX 70/30) (70-30) 100 UNIT/ML injection Inject 0.12 mLs (12 Units total) into the skin 2 (two) times a day. (Patient taking differently: Inject 12-15  Units into the skin 2 (two) times a day. ) 10 mL 11  . methocarbamol (ROBAXIN) 500 MG tablet Take 1 tablet (500 mg total) by mouth every 6 (six) hours as needed for muscle spasms. 40 tablet 0  . Multiple Vitamins-Minerals (PRESERVISION AREDS) CAPS Take 2 capsules by mouth daily.    . polyethylene glycol (MIRALAX / GLYCOLAX) 17 g packet Take 17 g by mouth 2 (two) times daily. 28 packet 0  . pravastatin (PRAVACHOL) 20 MG tablet TAKE 1 TABLET DAILY IN THE EVENING 90 tablet 1   No current facility-administered medications for this visit.   Facility-Administered Medications Ordered in Other Visits  Medication Dose Route Frequency Provider Last Rate Last Admin  . neomycin-polymyxin-dexameth (MAXITROL) 0.1 % ophth ointment    PRN Tonny Briunna Leicht, MD   1 application at 29/47/65 1427     Past Surgical History:  Procedure Laterality Date  . [place]   [facility]  . CATARACT EXTRACTION W/PHACO  01/11/2011   Procedure: CATARACT EXTRACTION PHACO AND INTRAOCULAR LENS PLACEMENT (IOC);  Surgeon: Tonny Saleema Weppler;  Location: [facility]S;  Service: Ophthalmology;  Laterality: Right;  CDE 19.32  . CATARACT EXTRACTION W/PHACO  01/21/2011   Procedure: CATARACT EXTRACTION PHACO AND INTRAOCULAR LENS PLACEMENT (IOC);  Surgeon: Tonny Emmily Pellegrin;  Location: [facility] ORS;  Service: Ophthalmology;  Laterality: Left;  CDE: 21.19  . COLONOSCOPY N/A 09/12/2013   Procedure: COLONOSCOPY;  Surgeon: Rogene Houston, MD;  Location: [facility] ENDO SUITE;  Service: Endoscopy;  Laterality: N/A;  125  . CYSTOSCOPY     [name]  . LITHOTRIPSY     [facility]  . PARTIAL HYSTERECTOMY    . TOTAL HIP ARTHROPLASTY Left 09/11/2019   Procedure: TOTAL HIP ARTHROPLASTY ANTERIOR APPROACH;  Surgeon: Paralee Cancel, MD;  Location: [facility]S;  Service: Orthopedics;  Laterality: Left;  70 mins     Allergies  Allergen Reactions  . Atorvastatin Other (See Comments)    Leg pains  . Contrast Media [Iodinated Diagnostic Agents]     Pt has 1 full functioning kidney  .  Penicillins Nausea And Vomiting and Rash    Tolerated Cephalosporin Date: 09/11/19.    Did it involve swelling of the face/tongue/throat, SOB, or low BP? No Did it involve sudden or severe rash/hives, skin peeling, or any reaction on the inside of your mouth or nose? Yes Did you need to seek medical attention at a hospital or doctor's office? Yes When did it last happen?65 yrs ago If all above answers are "NO", may proceed with cephalosporin use.       Family History  Problem Relation Age of Onset  . Colon cancer Brother      Social History Ms. Shannahan reports that she has never smoked. She has never used smokeless tobacco. Ms. Defilippo reports no  history of alcohol use.   Review of Systems CONSTITUTIONAL: No weight loss, fever, chills, weakness or fatigue.  HEENT: Eyes: No visual loss, blurred vision, double vision or yellow sclerae.No hearing loss, sneezing, congestion, runny nose or sore throat.  SKIN: No rash or itching.  CARDIOVASCULAR: per hpi RESPIRATORY: No shortness of breath, cough or sputum.  GASTROINTESTINAL: No anorexia, nausea, vomiting or diarrhea. No abdominal pain or blood.  GENITOURINARY: No burning on urination, no polyuria NEUROLOGICAL: No headache, dizziness, syncope, paralysis, ataxia, numbness or tingling in the extremities. No change in bowel or bladder control.  MUSCULOSKELETAL: No muscle, back pain, joint pain or stiffness.  LYMPHATICS: No enlarged nodes. No history of splenectomy.  PSYCHIATRIC: No history of depression or anxiety.  ENDOCRINOLOGIC: No reports of sweating, cold or heat intolerance. No polyuria or polydipsia.  Marland Kitchen   Physical Examination Today's Vitals   02/18/20 1453  BP: 124/70  Pulse: 74  SpO2: 98%  Weight: 178 lb (80.7 kg)  Height: 5\' 4"  (1.626 m)   Body mass index is 30.55 kg/m.  Gen: resting comfortably, no acute distress HEENT: no scleral icterus, pupils equal round and reactive, no palptable cervical  adenopathy,  CV: RRR, 3/6 systolic murmur rusb, no jvd Resp: Clear to auscultation bilaterally GI: abdomen is soft, non-tender, non-distended, normal bowel sounds, no hepatosplenomegaly MSK: extremities are warm, no edema.  Skin: warm, no rash Neuro:  no focal deficits Psych: appropriate affect   Diagnostic Studies  04/2019 echo IMPRESSIONS    1. Left ventricular ejection fraction, by estimation, is 60 to 65%. The  left ventricle has normal function. The left ventricle has no regional  wall motion abnormalities. There is mild concentric left ventricular  hypertrophy. Left ventricular diastolic  parameters are indeterminate. Elevated left ventricular end-diastolic  pressure.  2. Right ventricular systolic function is low normal. The right  ventricular size is normal. There is moderately elevated pulmonary artery  systolic pressure.  3. Left atrial size was severely dilated.  4. Right atrial size was mildly dilated.  5. The mitral valve is degenerative. Moderate mitral valve regurgitation.  6. Tricuspid valve regurgitation is moderate.  7. Aortic valve is functionally bicuspid. Morphologically, there appears  to be moderate calcific aortic stenosis but peak velocities and gradients  are lower (in mild range). The aortic valve is abnormal. Aortic valve  regurgitation is not visualized.  Mild to moderate aortic valve stenosis. Aortic valve area, by VTI measures  1.10 cm. Aortic valve mean gradient measures 11.5 mmHg. Aortic valve Vmax  measures 2.22 m/s.  8. The inferior vena cava is normal in size with <50% respiratory  variability, suggesting right atrial pressure of 8 mmHg.     Assessment and Plan  1.Chronic diastolic HF - euvolemic and no symptoms, continue current diuretic.    2. Aortic stenosis - mild to moderate by recent echo - we will continue to monitor   3. HTN - at goal, continue current meds  4. Afib - no symptoms, continue current meds  including anticoag     Arnoldo Lenis, M.D.

## 2020-02-18 NOTE — Patient Instructions (Signed)

## 2020-02-19 DIAGNOSIS — I10 Essential (primary) hypertension: Secondary | ICD-10-CM | POA: Diagnosis not present

## 2020-02-19 DIAGNOSIS — E119 Type 2 diabetes mellitus without complications: Secondary | ICD-10-CM | POA: Diagnosis not present

## 2020-02-19 DIAGNOSIS — I4891 Unspecified atrial fibrillation: Secondary | ICD-10-CM | POA: Diagnosis not present

## 2020-02-20 DIAGNOSIS — R69 Illness, unspecified: Secondary | ICD-10-CM | POA: Diagnosis not present

## 2020-02-25 ENCOUNTER — Other Ambulatory Visit: Payer: Self-pay | Admitting: Cardiology

## 2020-02-29 ENCOUNTER — Other Ambulatory Visit: Payer: Self-pay | Admitting: Cardiology

## 2020-03-05 DIAGNOSIS — N189 Chronic kidney disease, unspecified: Secondary | ICD-10-CM | POA: Diagnosis not present

## 2020-03-05 DIAGNOSIS — D631 Anemia in chronic kidney disease: Secondary | ICD-10-CM | POA: Diagnosis not present

## 2020-03-05 DIAGNOSIS — E1122 Type 2 diabetes mellitus with diabetic chronic kidney disease: Secondary | ICD-10-CM | POA: Diagnosis not present

## 2020-03-05 DIAGNOSIS — W19XXXA Unspecified fall, initial encounter: Secondary | ICD-10-CM | POA: Diagnosis not present

## 2020-03-05 DIAGNOSIS — Y92009 Unspecified place in unspecified non-institutional (private) residence as the place of occurrence of the external cause: Secondary | ICD-10-CM | POA: Diagnosis not present

## 2020-03-05 DIAGNOSIS — I129 Hypertensive chronic kidney disease with stage 1 through stage 4 chronic kidney disease, or unspecified chronic kidney disease: Secondary | ICD-10-CM | POA: Diagnosis not present

## 2020-03-05 DIAGNOSIS — I5032 Chronic diastolic (congestive) heart failure: Secondary | ICD-10-CM | POA: Diagnosis not present

## 2020-03-05 DIAGNOSIS — N184 Chronic kidney disease, stage 4 (severe): Secondary | ICD-10-CM | POA: Diagnosis not present

## 2020-03-05 DIAGNOSIS — N2581 Secondary hyperparathyroidism of renal origin: Secondary | ICD-10-CM | POA: Diagnosis not present

## 2020-03-05 DIAGNOSIS — I480 Paroxysmal atrial fibrillation: Secondary | ICD-10-CM | POA: Diagnosis not present

## 2020-03-07 ENCOUNTER — Other Ambulatory Visit: Payer: Self-pay | Admitting: Cardiology

## 2020-03-07 DIAGNOSIS — E1165 Type 2 diabetes mellitus with hyperglycemia: Secondary | ICD-10-CM | POA: Diagnosis not present

## 2020-03-07 DIAGNOSIS — M1611 Unilateral primary osteoarthritis, right hip: Secondary | ICD-10-CM | POA: Diagnosis not present

## 2020-03-07 DIAGNOSIS — M25551 Pain in right hip: Secondary | ICD-10-CM | POA: Diagnosis not present

## 2020-03-07 DIAGNOSIS — I10 Essential (primary) hypertension: Secondary | ICD-10-CM | POA: Diagnosis not present

## 2020-03-07 DIAGNOSIS — Z043 Encounter for examination and observation following other accident: Secondary | ICD-10-CM | POA: Diagnosis not present

## 2020-03-07 DIAGNOSIS — Z299 Encounter for prophylactic measures, unspecified: Secondary | ICD-10-CM | POA: Diagnosis not present

## 2020-03-07 DIAGNOSIS — W182XXA Fall in (into) shower or empty bathtub, initial encounter: Secondary | ICD-10-CM | POA: Diagnosis not present

## 2020-03-07 DIAGNOSIS — E1122 Type 2 diabetes mellitus with diabetic chronic kidney disease: Secondary | ICD-10-CM | POA: Diagnosis not present

## 2020-03-07 DIAGNOSIS — M5136 Other intervertebral disc degeneration, lumbar region: Secondary | ICD-10-CM | POA: Diagnosis not present

## 2020-03-12 DIAGNOSIS — I10 Essential (primary) hypertension: Secondary | ICD-10-CM | POA: Diagnosis not present

## 2020-03-12 DIAGNOSIS — E1122 Type 2 diabetes mellitus with diabetic chronic kidney disease: Secondary | ICD-10-CM | POA: Diagnosis not present

## 2020-03-12 DIAGNOSIS — Z299 Encounter for prophylactic measures, unspecified: Secondary | ICD-10-CM | POA: Diagnosis not present

## 2020-03-12 DIAGNOSIS — I5032 Chronic diastolic (congestive) heart failure: Secondary | ICD-10-CM | POA: Diagnosis not present

## 2020-03-12 DIAGNOSIS — M25559 Pain in unspecified hip: Secondary | ICD-10-CM | POA: Diagnosis not present

## 2020-03-12 DIAGNOSIS — E1165 Type 2 diabetes mellitus with hyperglycemia: Secondary | ICD-10-CM | POA: Diagnosis not present

## 2020-03-20 DIAGNOSIS — I4891 Unspecified atrial fibrillation: Secondary | ICD-10-CM | POA: Diagnosis not present

## 2020-03-20 DIAGNOSIS — I10 Essential (primary) hypertension: Secondary | ICD-10-CM | POA: Diagnosis not present

## 2020-03-20 DIAGNOSIS — E119 Type 2 diabetes mellitus without complications: Secondary | ICD-10-CM | POA: Diagnosis not present

## 2020-04-21 DIAGNOSIS — E1122 Type 2 diabetes mellitus with diabetic chronic kidney disease: Secondary | ICD-10-CM | POA: Diagnosis not present

## 2020-04-21 DIAGNOSIS — I4891 Unspecified atrial fibrillation: Secondary | ICD-10-CM | POA: Diagnosis not present

## 2020-04-21 DIAGNOSIS — I10 Essential (primary) hypertension: Secondary | ICD-10-CM | POA: Diagnosis not present

## 2020-04-21 DIAGNOSIS — H16143 Punctate keratitis, bilateral: Secondary | ICD-10-CM | POA: Diagnosis not present

## 2020-04-21 DIAGNOSIS — E7800 Pure hypercholesterolemia, unspecified: Secondary | ICD-10-CM | POA: Diagnosis not present

## 2020-05-19 DIAGNOSIS — E7800 Pure hypercholesterolemia, unspecified: Secondary | ICD-10-CM | POA: Diagnosis not present

## 2020-05-19 DIAGNOSIS — I10 Essential (primary) hypertension: Secondary | ICD-10-CM | POA: Diagnosis not present

## 2020-05-19 DIAGNOSIS — I4891 Unspecified atrial fibrillation: Secondary | ICD-10-CM | POA: Diagnosis not present

## 2020-05-19 DIAGNOSIS — E1122 Type 2 diabetes mellitus with diabetic chronic kidney disease: Secondary | ICD-10-CM | POA: Diagnosis not present

## 2020-05-30 DIAGNOSIS — M5136 Other intervertebral disc degeneration, lumbar region: Secondary | ICD-10-CM | POA: Diagnosis not present

## 2020-05-30 DIAGNOSIS — M7918 Myalgia, other site: Secondary | ICD-10-CM | POA: Diagnosis not present

## 2020-05-30 DIAGNOSIS — M47816 Spondylosis without myelopathy or radiculopathy, lumbar region: Secondary | ICD-10-CM | POA: Diagnosis not present

## 2020-05-30 DIAGNOSIS — Z96642 Presence of left artificial hip joint: Secondary | ICD-10-CM | POA: Diagnosis not present

## 2020-06-16 DIAGNOSIS — E1122 Type 2 diabetes mellitus with diabetic chronic kidney disease: Secondary | ICD-10-CM | POA: Diagnosis not present

## 2020-06-16 DIAGNOSIS — I10 Essential (primary) hypertension: Secondary | ICD-10-CM | POA: Diagnosis not present

## 2020-06-16 DIAGNOSIS — Z299 Encounter for prophylactic measures, unspecified: Secondary | ICD-10-CM | POA: Diagnosis not present

## 2020-06-16 DIAGNOSIS — E1142 Type 2 diabetes mellitus with diabetic polyneuropathy: Secondary | ICD-10-CM | POA: Diagnosis not present

## 2020-06-16 DIAGNOSIS — E1165 Type 2 diabetes mellitus with hyperglycemia: Secondary | ICD-10-CM | POA: Diagnosis not present

## 2020-06-16 DIAGNOSIS — N184 Chronic kidney disease, stage 4 (severe): Secondary | ICD-10-CM | POA: Diagnosis not present

## 2020-06-19 DIAGNOSIS — I10 Essential (primary) hypertension: Secondary | ICD-10-CM | POA: Diagnosis not present

## 2020-06-30 DIAGNOSIS — Z299 Encounter for prophylactic measures, unspecified: Secondary | ICD-10-CM | POA: Diagnosis not present

## 2020-06-30 DIAGNOSIS — I4891 Unspecified atrial fibrillation: Secondary | ICD-10-CM | POA: Diagnosis not present

## 2020-06-30 DIAGNOSIS — J449 Chronic obstructive pulmonary disease, unspecified: Secondary | ICD-10-CM | POA: Diagnosis not present

## 2020-06-30 DIAGNOSIS — N184 Chronic kidney disease, stage 4 (severe): Secondary | ICD-10-CM | POA: Diagnosis not present

## 2020-06-30 DIAGNOSIS — R5383 Other fatigue: Secondary | ICD-10-CM | POA: Diagnosis not present

## 2020-06-30 DIAGNOSIS — I10 Essential (primary) hypertension: Secondary | ICD-10-CM | POA: Diagnosis not present

## 2020-06-30 DIAGNOSIS — E7800 Pure hypercholesterolemia, unspecified: Secondary | ICD-10-CM | POA: Diagnosis not present

## 2020-07-03 DIAGNOSIS — M5136 Other intervertebral disc degeneration, lumbar region: Secondary | ICD-10-CM | POA: Diagnosis not present

## 2020-07-03 DIAGNOSIS — M47816 Spondylosis without myelopathy or radiculopathy, lumbar region: Secondary | ICD-10-CM | POA: Diagnosis not present

## 2020-07-03 DIAGNOSIS — M48062 Spinal stenosis, lumbar region with neurogenic claudication: Secondary | ICD-10-CM | POA: Diagnosis not present

## 2020-07-17 DIAGNOSIS — N184 Chronic kidney disease, stage 4 (severe): Secondary | ICD-10-CM | POA: Diagnosis not present

## 2020-07-17 DIAGNOSIS — N189 Chronic kidney disease, unspecified: Secondary | ICD-10-CM | POA: Diagnosis not present

## 2020-07-17 DIAGNOSIS — I480 Paroxysmal atrial fibrillation: Secondary | ICD-10-CM | POA: Diagnosis not present

## 2020-07-17 DIAGNOSIS — N2581 Secondary hyperparathyroidism of renal origin: Secondary | ICD-10-CM | POA: Diagnosis not present

## 2020-07-17 DIAGNOSIS — I129 Hypertensive chronic kidney disease with stage 1 through stage 4 chronic kidney disease, or unspecified chronic kidney disease: Secondary | ICD-10-CM | POA: Diagnosis not present

## 2020-07-17 DIAGNOSIS — D631 Anemia in chronic kidney disease: Secondary | ICD-10-CM | POA: Diagnosis not present

## 2020-07-17 DIAGNOSIS — E1122 Type 2 diabetes mellitus with diabetic chronic kidney disease: Secondary | ICD-10-CM | POA: Diagnosis not present

## 2020-07-17 DIAGNOSIS — I5032 Chronic diastolic (congestive) heart failure: Secondary | ICD-10-CM | POA: Diagnosis not present

## 2020-07-18 DIAGNOSIS — I10 Essential (primary) hypertension: Secondary | ICD-10-CM | POA: Diagnosis not present

## 2020-07-21 DIAGNOSIS — L570 Actinic keratosis: Secondary | ICD-10-CM | POA: Diagnosis not present

## 2020-07-21 DIAGNOSIS — Z1283 Encounter for screening for malignant neoplasm of skin: Secondary | ICD-10-CM | POA: Diagnosis not present

## 2020-07-21 DIAGNOSIS — L821 Other seborrheic keratosis: Secondary | ICD-10-CM | POA: Diagnosis not present

## 2020-07-22 ENCOUNTER — Telehealth: Payer: Self-pay | Admitting: Cardiology

## 2020-07-22 ENCOUNTER — Encounter: Payer: Self-pay | Admitting: *Deleted

## 2020-07-22 NOTE — Telephone Encounter (Signed)
Pt c/o Shortness Of Breath: STAT if SOB developed within the last 24 hours or pt is noticeably SOB on the phone  1. Are you currently SOB (can you hear that pt is SOB on the phone)?  Speaking to her daughter   2. How long have you been experiencing SOB?   For a month   3. Are you SOB when sitting or when up moving around? Both   4. Are you currently experiencing any other symptoms? Tightness of chest  Was seen by [facility] 07-17-2020. Was told to increase her Lasix for 3 days and when she went off Lasix she started retaining fluid again   Appointment has been made for 5/6.

## 2020-07-22 NOTE — Telephone Encounter (Signed)
Reports SOB. Denies chest pain. Denies coughing, congestion, fever or swelling. Reports tightness in chest. Reports gaining 2 lbs overnight. Weight today 179 lbs and yesterday 177.4 lbs. Reports taking 80 mg furosemide today. Medications reviewed. Recent office note and lab work requested from Dr Hollie Salk. Advised to keep appointment scheduled on 07/25/20 @2 :00 pm with Katina Dung, NP. Advised if symptoms got worse to go to the ED for an evaluation. Verbalized understanding of plan.

## 2020-07-25 ENCOUNTER — Ambulatory Visit (INDEPENDENT_AMBULATORY_CARE_PROVIDER_SITE_OTHER): Payer: Medicare HMO | Admitting: Cardiology

## 2020-07-25 ENCOUNTER — Encounter: Payer: Self-pay | Admitting: Cardiology

## 2020-07-25 ENCOUNTER — Other Ambulatory Visit: Payer: Self-pay

## 2020-07-25 VITALS — BP 152/90 | HR 68 | Ht 63.0 in | Wt 178.0 lb

## 2020-07-25 DIAGNOSIS — I482 Chronic atrial fibrillation, unspecified: Secondary | ICD-10-CM | POA: Diagnosis not present

## 2020-07-25 DIAGNOSIS — I350 Nonrheumatic aortic (valve) stenosis: Secondary | ICD-10-CM

## 2020-07-25 DIAGNOSIS — I5033 Acute on chronic diastolic (congestive) heart failure: Secondary | ICD-10-CM | POA: Diagnosis not present

## 2020-07-25 NOTE — Progress Notes (Signed)
Clinical Summary Miranda Garcia is a 86 y.o.female seen today for follow up of the following medical problems.  1. Chronic diastolic HF - working to limit diuretic due to her renal dysfunction 04/2018 echo: LVEF60-65%, normal RV, evidence of diastolic dysfunction  03/6107 echo LVEF 60-65%, indet DDx, mod pulm HTN, severe LAE, mod MR, mod TR, mild to mod AS.  - had some SOB/DOE, abdominal fullnes. Had some orthopnea. Home weights usually 172 to 173 lbs, had trended up to 179 lbs.  - neprhology adjusted diuretic. taking lasix 80mg  daily. Has had some Cr elevation 2.7-->3.5 - breathing is improving with higher diuretic dosing. Home eights trending down to 174 lbs    2. CKDIV -followed by nephorlogyDr Miranda Garcia  3. Dyspnea - extensive pulmonmary and cardiac workup over the last few months.  Jan 2020 PFTs suggestive of restriction, mildly redcued DLCO but corrects for alveolar ventilation  04/2018 echo: LVEF 60-65%, indeterminate diastolic function,reportedmoderate aortic stenosis - 05/2018 nuclear stress: no ischemia 05/2018 CT chest high resolution: findings suggesting of interstitial lung disease vs alveolar hemorrage.  05/2018 VQ scan: low probability PE,Patchy retention of the ventilation agent centrally may be seen with air trapping or COPD.    4.HTN She is compliantw with meds - recent elevation in bp's in setting of fluid overload.    5. Afib -no palpitations.  - home HRs in 70s to 80s   6. Aortic stenosis -mild to moderate by 04/2018 echo - 04/2019 mild to mod AS  7. Chronic hip pain - followed by ortho - recent hip replacement 08/2019    Past Medical History:  Diagnosis Date  . Arthritis    back, knees  . Atrial fibrillation ([place])   . CHF (congestive heart failure) ([place]) 08/2018  . Diabetes mellitus type II, controlled ([place])    x 15 yrs  . Dysrhythmia    A-fib  . Elevated troponin   . Hemorrhoids   . History of kidney stones    . Hypertension   . Neuromuscular disorder (HCC)    numbness in hands   . Renal insufficiency    stage iv     Allergies  Allergen Reactions  . Atorvastatin Other (See Comments)    Leg pains  . Contrast Media [Iodinated Diagnostic Agents]     Pt has 1 full functioning kidney  . Penicillins Nausea And Vomiting and Rash    Tolerated Cephalosporin Date: 09/11/19.    Did it involve swelling of the face/tongue/throat, SOB, or low BP? No Did it involve sudden or severe rash/hives, skin peeling, or any reaction on the inside of your mouth or nose? Yes Did you need to seek medical attention at a hospital or doctor's office? Yes When did it last happen?65 yrs ago If all above answers are "NO", may proceed with cephalosporin use.      Current Outpatient Medications  Medication Sig Dispense Refill  . Calcium Carbonate-Vit D-Min (CALTRATE 600+D PLUS MINERALS) 600-800 MG-UNIT TABS Take 1 tablet by mouth daily.    . carvedilol (COREG) 6.25 MG tablet Take 6.25 mg by mouth in the morning, at noon, and at bedtime.     . Cholecalciferol (VITAMIN D3) 10 MCG (400 UNIT) tablet Take 400 Units by mouth daily.    Marland Kitchen diltiazem (CARDIZEM CD) 180 MG 24 hr capsule TAKE ONE (1) CAPSULE EACH DAY (Patient taking differently: Take 180 mg by mouth daily. TAKE ONE (1) CAPSULE EACH DAY) 90 capsule 1  . ELIQUIS 2.5 MG TABS tablet  TAKE ONE TABLET BY MOUTH TWICE DAILY 60 tablet 6  . furosemide (LASIX) 40 MG tablet Take 1 tablet (40 mg total) by mouth daily. 30 tablet 2  . insulin aspart protamine- aspart (NOVOLOG MIX 70/30) (70-30) 100 UNIT/ML injection Inject 0.12 mLs (12 Units total) into the skin 2 (two) times a day. (Patient taking differently: Inject 12-15 Units into the skin 2 (two) times a day. ) 10 mL 11  . Multiple Vitamins-Minerals (PRESERVISION AREDS) CAPS Take 2 capsules by mouth daily.    . pravastatin (PRAVACHOL) 20 MG tablet TAKE 1 TABLET DAILY IN THE EVENING 90 tablet 1   No current  facility-administered medications for this visit.   Facility-Administered Medications Ordered in Other Visits  Medication Dose Route Frequency Provider Last Rate Last Admin  . neomycin-polymyxin-dexameth (MAXITROL) 0.1 % ophth ointment    PRN Miranda Ecko Beasley, MD   1 application at 63/01/60 1427     Past Surgical History:  Procedure Laterality Date  . [place]   [facility]  . CATARACT EXTRACTION W/PHACO  01/11/2011   Procedure: CATARACT EXTRACTION PHACO AND INTRAOCULAR LENS PLACEMENT (IOC);  Surgeon: Miranda Garcia;  Location: [facility];  Service: Ophthalmology;  Laterality: Right;  CDE 19.32  . CATARACT EXTRACTION W/PHACO  01/21/2011   Procedure: CATARACT EXTRACTION PHACO AND INTRAOCULAR LENS PLACEMENT (IOC);  Surgeon: Miranda Garcia;  Location: [facility];  Service: Ophthalmology;  Laterality: Left;  CDE: 21.19  . COLONOSCOPY N/A 09/12/2013   Procedure: COLONOSCOPY;  Surgeon: Miranda Houston, MD;  Location[facility];  Service: Endoscopy;  Laterality: N/A;  125  . CYSTOSCOPY     Miranda Garcia  . LITHOTRIPSY     [facility]  . PARTIAL HYSTERECTOMY    . TOTAL HIP ARTHROPLASTY Left 09/11/2019   Procedure: TOTAL HIP ARTHROPLASTY ANTERIOR APPROACH;  Surgeon: Miranda Cancel, MD;  Location: [facility];  Service: Orthopedics;  Laterality: Left;  70 mins     Allergies  Allergen Reactions  . Atorvastatin Other (See Comments)    Leg pains  . Contrast Media [Iodinated Diagnostic Agents]     Pt has 1 full functioning kidney  . Penicillins Nausea And Vomiting and Rash    Tolerated Cephalosporin Date: 09/11/19.    Did it involve swelling of the face/tongue/throat, SOB, or low BP? No Did it involve sudden or severe rash/hives, skin peeling, or any reaction on the inside of your mouth or nose? Yes Did you need to seek medical attention at a hospital or doctor's office? Yes When did it last happen?65 yrs ago If all above answers are "NO", may proceed with cephalosporin use.       Family History  Problem  Relation Age of Onset  . Colon cancer Brother      Social History Miranda Garcia reports that she has never smoked. She has never used smokeless tobacco. Miranda Garcia reports no history of alcohol use.   Review of Systems CONSTITUTIONAL: No weight loss, fever, chills, weakness or fatigue.  HEENT: Eyes: No visual loss, blurred vision, double vision or yellow sclerae.No hearing loss, sneezing, congestion, runny nose or sore throat.  SKIN: No rash or itching.  CARDIOVASCULAR: per hpi RESPIRATORY: No shortness of breath, cough or sputum.  GASTROINTESTINAL: No anorexia, nausea, vomiting or diarrhea. No abdominal pain or blood.  GENITOURINARY: No burning on urination, no polyuria NEUROLOGICAL: No headache, dizziness, syncope, paralysis, ataxia, numbness or tingling in the extremities. No change in bowel or bladder control.  MUSCULOSKELETAL: No muscle, back pain, joint  pain or stiffness.  LYMPHATICS: No enlarged nodes. No history of splenectomy.  PSYCHIATRIC: No history of depression or anxiety.  ENDOCRINOLOGIC: No reports of sweating, cold or heat intolerance. No polyuria or polydipsia.  Marland Kitchen   Physical Examination Today's Vitals   07/25/20 1420  BP: (!) 152/90  Pulse: 68  SpO2: 98%  Weight: 178 lb (80.7 kg)  Height: 5\' 3"  (1.6 m)   Body mass index is 31.53 kg/m.  Gen: resting comfortably, no acute distress HEENT: no scleral icterus, pupils equal round and reactive, no palptable cervical adenopathy,  CV: irreg, no mr/g no jvd Resp: Clear to auscultation bilaterally GI: abdomen is soft, non-tender, non-distended, normal bowel sounds, no hepatosplenomegaly MSK: extremities are warm, no edema.  Skin: warm, no rash Neuro:  no focal deficits Psych: appropriate affect   Diagnostic Studies 04/2019 echo IMPRESSIONS    1. Left ventricular ejection fraction, by estimation, is 60 to 65%. The  left ventricle has normal function. The left ventricle has no regional  wall motion  abnormalities. There is mild concentric left ventricular  hypertrophy. Left ventricular diastolic  parameters are indeterminate. Elevated left ventricular end-diastolic  pressure.  2. Right ventricular systolic function is low normal. The right  ventricular size is normal. There is moderately elevated pulmonary artery  systolic pressure.  3. Left atrial size was severely dilated.  4. Right atrial size was mildly dilated.  5. The mitral valve is degenerative. Moderate mitral valve regurgitation.  6. Tricuspid valve regurgitation is moderate.  7. Aortic valve is functionally bicuspid. Morphologically, there appears  to be moderate calcific aortic stenosis but peak velocities and gradients  are lower (in mild range). The aortic valve is abnormal. Aortic valve  regurgitation is not visualized.  Mild to moderate aortic valve stenosis. Aortic valve area, by VTI measures  1.10 cm. Aortic valve mean gradient measures 11.5 mmHg. Aortic valve Vmax  measures 2.22 m/s.  8. The inferior vena cava is normal in size with <50% respiratory  variability, suggesting right atrial pressure of 8 mmHg.      Assessment and Plan  1.Acute on chronic diastolic HF - symptoms improving with recent adjustements in diuretic by neprhology - defer diuretic to nephrology - we will repeat an echo to evaluate for any new cardaic dysfunction, reassess her aortic stenosis.    2. Aortic stenosis - mild to moderateby recent echo - with recent issues with SOB and volume overload, repeat echo.    3. Afib - ekg shows rate controlled afib, no symptoms - continue current meds     Arnoldo Lenis, M.D.

## 2020-07-25 NOTE — Patient Instructions (Signed)
Medication Instructions:  Your physician recommends that you continue on your current medications as directed. Please refer to the Current Medication list given to you today.  *If you need a refill on your cardiac medications before your next appointment, please call your pharmacy*   Lab Work: NONE   If you have labs (blood work) drawn today and your tests are completely normal, you will receive your results only by: Marland Kitchen MyChart Message (if you have MyChart) OR . A paper copy in the mail If you have any lab test that is abnormal or we need to change your treatment, we will call you to review the results.   Testing/Procedures: Your physician has requested that you have an echocardiogram. Echocardiography is a painless test that uses sound waves to create images of your heart. It provides your doctor with information about the size and shape of your heart and how well your heart's chambers and valves are working. This procedure takes approximately one hour. There are no restrictions for this procedure.   Follow-Up: At [facility], you and your health needs are our priority.  As part of our continuing mission to provide you with exceptional heart care, we have created designated Provider Care Teams.  These Care Teams include your primary Cardiologist (physician) and Advanced Practice Providers (APPs -  Physician Assistants and Nurse Practitioners) who all work together to provide you with the care you need, when you need it.  We recommend signing up for the patient portal called "MyChart".  Sign up information is provided on this After Visit Summary.  MyChart is used to connect with patients for Virtual Visits (Telemedicine).  Patients are able to view lab/test results, encounter notes, upcoming appointments, etc.  Non-urgent messages can be sent to your provider as well.   To learn more about what you can do with MyChart, go to [email or web address].    Your next appointment:    in June    The format for your next appointment:   In Person  Provider:   Carlyle Dolly, MD   Other Instructions Thank you for choosing [facility]!

## 2020-07-31 DIAGNOSIS — H524 Presbyopia: Secondary | ICD-10-CM | POA: Diagnosis not present

## 2020-07-31 DIAGNOSIS — E083293 Diabetes mellitus due to underlying condition with mild nonproliferative diabetic retinopathy without macular edema, bilateral: Secondary | ICD-10-CM | POA: Diagnosis not present

## 2020-08-07 DIAGNOSIS — H43812 Vitreous degeneration, left eye: Secondary | ICD-10-CM | POA: Diagnosis not present

## 2020-08-07 DIAGNOSIS — H353132 Nonexudative age-related macular degeneration, bilateral, intermediate dry stage: Secondary | ICD-10-CM | POA: Diagnosis not present

## 2020-08-07 DIAGNOSIS — N184 Chronic kidney disease, stage 4 (severe): Secondary | ICD-10-CM | POA: Diagnosis not present

## 2020-08-07 DIAGNOSIS — H43392 Other vitreous opacities, left eye: Secondary | ICD-10-CM | POA: Diagnosis not present

## 2020-08-18 DIAGNOSIS — E7800 Pure hypercholesterolemia, unspecified: Secondary | ICD-10-CM | POA: Diagnosis not present

## 2020-08-18 DIAGNOSIS — I10 Essential (primary) hypertension: Secondary | ICD-10-CM | POA: Diagnosis not present

## 2020-08-18 DIAGNOSIS — I4891 Unspecified atrial fibrillation: Secondary | ICD-10-CM | POA: Diagnosis not present

## 2020-08-18 DIAGNOSIS — E1122 Type 2 diabetes mellitus with diabetic chronic kidney disease: Secondary | ICD-10-CM | POA: Diagnosis not present

## 2020-08-22 ENCOUNTER — Other Ambulatory Visit: Payer: Self-pay | Admitting: Cardiology

## 2020-08-26 ENCOUNTER — Ambulatory Visit (INDEPENDENT_AMBULATORY_CARE_PROVIDER_SITE_OTHER): Payer: Medicare HMO

## 2020-08-26 DIAGNOSIS — I350 Nonrheumatic aortic (valve) stenosis: Secondary | ICD-10-CM

## 2020-08-26 LAB — ECHOCARDIOGRAM COMPLETE
AR max vel: 1.04 cm2
AV Area VTI: 0.97 cm2
AV Area mean vel: 1.15 cm2
AV Mean grad: 15.4 mmHg
AV Peak grad: 28.9 mmHg
Ao pk vel: 2.69 m/s
Area-P 1/2: 3.66 cm2
Calc EF: 61 %
MV M vel: 4.87 m/s
MV Peak grad: 94.9 mmHg
S' Lateral: 2.31 cm
Single Plane A2C EF: 62.1 %
Single Plane A4C EF: 59.3 %

## 2020-09-01 ENCOUNTER — Ambulatory Visit (INDEPENDENT_AMBULATORY_CARE_PROVIDER_SITE_OTHER): Payer: Medicare HMO | Admitting: Cardiology

## 2020-09-01 ENCOUNTER — Encounter: Payer: Self-pay | Admitting: Cardiology

## 2020-09-01 VITALS — BP 120/70 | HR 72 | Ht 63.0 in | Wt 175.0 lb

## 2020-09-01 DIAGNOSIS — I350 Nonrheumatic aortic (valve) stenosis: Secondary | ICD-10-CM | POA: Diagnosis not present

## 2020-09-01 DIAGNOSIS — I5032 Chronic diastolic (congestive) heart failure: Secondary | ICD-10-CM | POA: Diagnosis not present

## 2020-09-01 NOTE — Progress Notes (Signed)
Clinical Summary Miranda Garcia is a 87 y.o.female seen today for follow up of the following medical problems. This is a focused visit on her history of chronic diastolic HF and aortic stenosis.    1. Chronic diastolic HF  - working to limit diuretic due to her renal dysfunction 04/2018 echo: LVEF  60-65%, normal RV, evidence of diastolic dysfunction   10/8278 echo LVEF 60-65%, indet DDx, mod pulm HTN, severe LAE, mod MR, mod TR, mild to mod AS.    - had some SOB/DOE, abdominal fullnes. Had some orthopnea. Home weights usually 172 to 173 lbs, had trended up to 179 lbs.  - neprhology adjusted diuretic. taking lasix 80mg  daily. Has had some Cr elevation 2.7-->3.5 - breathing is improving with higher diuretic dosing. Home eights trending down to 174 lbs    08/2020 echo LVEF 03-49%, indet diastolic fxn, normal RV, severe LAE, small to mod pericardial effusion, mild to mod MR, mod to severe AS  - home weights stable 171-172 lbs. - she repiorts last Cr 2.81 GFR 16    2. CKD IV - followed by nephorlogy Dr Hollie Salk    3. Aortic stenosis - mild to moderate by 04/2018 echo - 04/2019 mild to mod AS  08/2020 echo LVEF 17-91%, indet diastolic fxn, normal RV, severe LAE, small to mod pericardial effusion, mild to mod MR, mod to severe AS(DI 0.34, mean grad 15.4, AVA VTI 0.97, SVI 35.   - overall mod to severe AS heavily favoring the moderate side of the spectrum.     Past Medical History:  Diagnosis Date   Arthritis    back, knees   Atrial fibrillation (HCC)    CHF (congestive heart failure) ([place]) 08/2018   Diabetes mellitus type II, controlled ([place])    x 15 yrs   Dysrhythmia    A-fib   Elevated troponin    Hemorrhoids    History of kidney stones    Hypertension    Neuromuscular disorder (HCC)    numbness in hands    Renal insufficiency    stage iv     Allergies  Allergen Reactions   Atorvastatin Other (See Comments)    Leg pains   Contrast Media [Iodinated Diagnostic  Agents]     Pt has 1 full functioning kidney   Penicillins Nausea And Vomiting and Rash    Tolerated Cephalosporin Date: 09/11/19.    Did it involve swelling of the face/tongue/throat, SOB, or low BP? No Did it involve sudden or severe rash/hives, skin peeling, or any reaction on the inside of your mouth or nose? Yes Did you need to seek medical attention at a hospital or doctor's office? Yes When did it last happen?      65 yrs ago If all above answers are "NO", may proceed with cephalosporin use.      Current Outpatient Medications  Medication Sig Dispense Refill   Calcium Carbonate-Vit D-Min (CALTRATE 600+D PLUS MINERALS) 600-800 MG-UNIT TABS Take 1 tablet by mouth daily.     carvedilol (COREG) 12.5 MG tablet Take 12.5 mg by mouth 2 (two) times daily.     Cholecalciferol (VITAMIN D3) 10 MCG (400 UNIT) tablet Take 400 Units by mouth daily.     diltiazem (CARDIZEM CD) 240 MG 24 hr capsule Take 240 mg by mouth daily.     ELIQUIS 2.5 MG TABS tablet TAKE ONE TABLET BY MOUTH TWICE DAILY 60 tablet 6   furosemide (LASIX) 80 MG tablet Take 80 mg by mouth  daily.     insulin aspart protamine- aspart (NOVOLOG MIX 70/30) (70-30) 100 UNIT/ML injection Inject 0.12 mLs (12 Units total) into the skin 2 (two) times a day. (Patient taking differently: Inject 12-15 Units into the skin 2 (two) times a day.) 10 mL 11   Multiple Vitamins-Minerals (PRESERVISION AREDS) CAPS Take 2 capsules by mouth daily.     pravastatin (PRAVACHOL) 20 MG tablet TAKE 1 TABLET DAILY IN THE EVENING 90 tablet 1   No current facility-administered medications for this visit.   Facility-Administered Medications Ordered in Other Visits  Medication Dose Route Frequency Provider Last Rate Last Admin   neomycin-polymyxin-dexameth (MAXITROL) 0.1 % ophth ointment    PRN Tonny Aarthi Uyeno, MD   1 application at 55/97/41 1427     Past Surgical History:  Procedure Laterality Date   [place]   [facility]   CATARACT EXTRACTION  [facility]  01/11/2011   Procedure: CATARACT EXTRACTION PHACO AND INTRAOCULAR LENS PLACEMENT (IOC);  Surgeon: Tonny Grady Lucci;  Location: [facility];  Service: Ophthalmology;  Laterality: Right;  CDE 19.32   CATARACT EXTRACTION W/PHACO  01/21/2011   Procedure: CATARACT EXTRACTION PHACO AND INTRAOCULAR LENS PLACEMENT (IOC);  Surgeon: Tonny Coltrane Tugwell;  Location: [facility];  Service: Ophthalmology;  Laterality: Left;  CDE: 21.19   COLONOSCOPY N/A 09/12/2013   Procedure: COLONOSCOPY;  Surgeon: Rogene Houston, MD;  [facility];  Service: Endoscopy;  Laterality: N/A;  [place]   LITHOTRIPSY     [facility]   PARTIAL HYSTERECTOMY     TOTAL HIP ARTHROPLASTY Left 09/11/2019   Procedure: TOTAL HIP ARTHROPLASTY ANTERIOR APPROACH;  Surgeon: Paralee Cancel, MD;  Location: [facility];  Service: Orthopedics;  Laterality: Left;  70 mins     Allergies  Allergen Reactions   Atorvastatin Other (See Comments)    Leg pains   Contrast Media [Iodinated Diagnostic Agents]     Pt has 1 full functioning kidney   Penicillins Nausea And Vomiting and Rash    Tolerated Cephalosporin Date: 09/11/19.    Did it involve swelling of the face/tongue/throat, SOB, or low BP? No Did it involve sudden or severe rash/hives, skin peeling, or any reaction on the inside of your mouth or nose? Yes Did you need to seek medical attention at a hospital or doctor's office? Yes When did it last happen?      65 yrs ago If all above answers are "NO", may proceed with cephalosporin use.       Family History  Problem Relation Age of Onset   Colon cancer Brother      Social History Ms. Schlafer reports that she has never smoked. She has never used smokeless tobacco. Ms. Nutting reports no history of alcohol use.   Review of Systems CONSTITUTIONAL: No weight loss, fever, chills, weakness or fatigue.  HEENT: Eyes: No visual loss, blurred vision, double vision or yellow sclerae.No hearing loss, sneezing, congestion,  runny nose or sore throat.  SKIN: No rash or itching.  CARDIOVASCULAR: per hpi RESPIRATORY: No shortness of breath, cough or sputum.  GASTROINTESTINAL: No anorexia, nausea, vomiting or diarrhea. No abdominal pain or blood.  GENITOURINARY: No burning on urination, no polyuria NEUROLOGICAL: No headache, dizziness, syncope, paralysis, ataxia, numbness or tingling in the extremities. No change in bowel or bladder control.  MUSCULOSKELETAL: No muscle, back pain, joint pain or stiffness.  LYMPHATICS: No enlarged nodes. No history of splenectomy.  PSYCHIATRIC: No history of depression or anxiety.  ENDOCRINOLOGIC: No reports of sweating, cold or heat intolerance. No polyuria or polydipsia.  Marland Kitchen   Physical Examination Today's Vitals   09/01/20 1404  BP: 120/70  Pulse: 72  SpO2: 96%  Weight: 175 lb (79.4 kg)  Height: 5\' 3"  (1.6 m)   Body mass index is 31 kg/m.  Gen: resting comfortably, no acute distress HEENT: no scleral icterus, pupils equal round and reactive, no palptable cervical adenopathy,  CV: RRR, 3/6 systolic murmur rusb, no jvd Resp: Clear to auscultation bilaterally GI: abdomen is soft, non-tender, non-distended, normal bowel sounds, no hepatosplenomegaly MSK: extremities are warm, no edema.  Skin: warm, no rash Neuro:  no focal deficits Psych: appropriate affect   Diagnostic Studies  04/2019 echo IMPRESSIONS     1. Left ventricular ejection fraction, by estimation, is 60 to 65%. The  left ventricle has normal function. The left ventricle has no regional  wall motion abnormalities. There is mild concentric left ventricular  hypertrophy. Left ventricular diastolic  parameters are indeterminate. Elevated left ventricular end-diastolic  pressure.   2. Right ventricular systolic function is low normal. The right  ventricular size is normal. There is moderately elevated pulmonary artery  systolic pressure.   3. Left atrial size was severely dilated.   4. Right atrial  size was mildly dilated.   5. The mitral valve is degenerative. Moderate mitral valve regurgitation.   6. Tricuspid valve regurgitation is moderate.   7. Aortic valve is functionally bicuspid. Morphologically, there appears  to be moderate calcific aortic stenosis but peak velocities and gradients  are lower (in mild range). The aortic valve is abnormal. Aortic valve  regurgitation is not visualized.  Mild to moderate aortic valve stenosis. Aortic valve area, by VTI measures  1.10 cm. Aortic valve mean gradient measures 11.5 mmHg. Aortic valve Vmax  measures 2.22 m/s.   8. The inferior vena cava is normal in size with <50% respiratory  variability, suggesting right atrial pressure of 8 mmHg.   08/2020 echo LVEF 68-11%, indet diastolic fxn, normal RV, severe LAE, small to mod pericardial effusion, mild to mod MR, mod to severe AS  Assessment and Plan  1. Chronic diastolic HF - doing well with recent incease in lasix dosing, she is euvolemic and home weights stable 171 to 172 lbs, Cr trending back down followed closely by renal - stable echo findings - continue current meds     2. Aortic stenosis - moderate borderline severe, with majority criteria in the moderate range. Borderline low SVI so would lead to lower than expected gradients - continue to monitor at this time, valve not at point where it would have contributed significant to recent HF exacerbation.       Arnoldo Lenis, M.D.

## 2020-09-01 NOTE — Patient Instructions (Signed)
Medication Instructions:  Continue all current medications.   Labwork: none  Testing/Procedures: none  Follow-Up: 6 months   Any Other Special Instructions Will Be Listed Below (If Applicable).   If you need a refill on your cardiac medications before your next appointment, please call your pharmacy.  

## 2020-09-23 ENCOUNTER — Other Ambulatory Visit: Payer: Self-pay | Admitting: Cardiology

## 2020-09-23 NOTE — Telephone Encounter (Signed)
Prescription refill request for Eliquis received. Indication: afib  Last office visit: [place], 09/01/2020 Scr: 3.54, 07/17/2020 Age: 87 yo  Weight: 79.4 kg   Pt is on the correct dose of Eliquis per dosing criteria, prescription refill sent for Eliquis 2.5 mg bid.

## 2020-10-01 IMAGING — DX CHEST - 2 VIEW
2 series · 2 of 2 positions shown · non-contrast
Comparison: Chest CT dated 03/10/2018 and chest x-ray dated
02/22/2018

CLINICAL DATA: Hemoptysis.

EXAM:
CHEST - 2 VIEW

[chest pa]
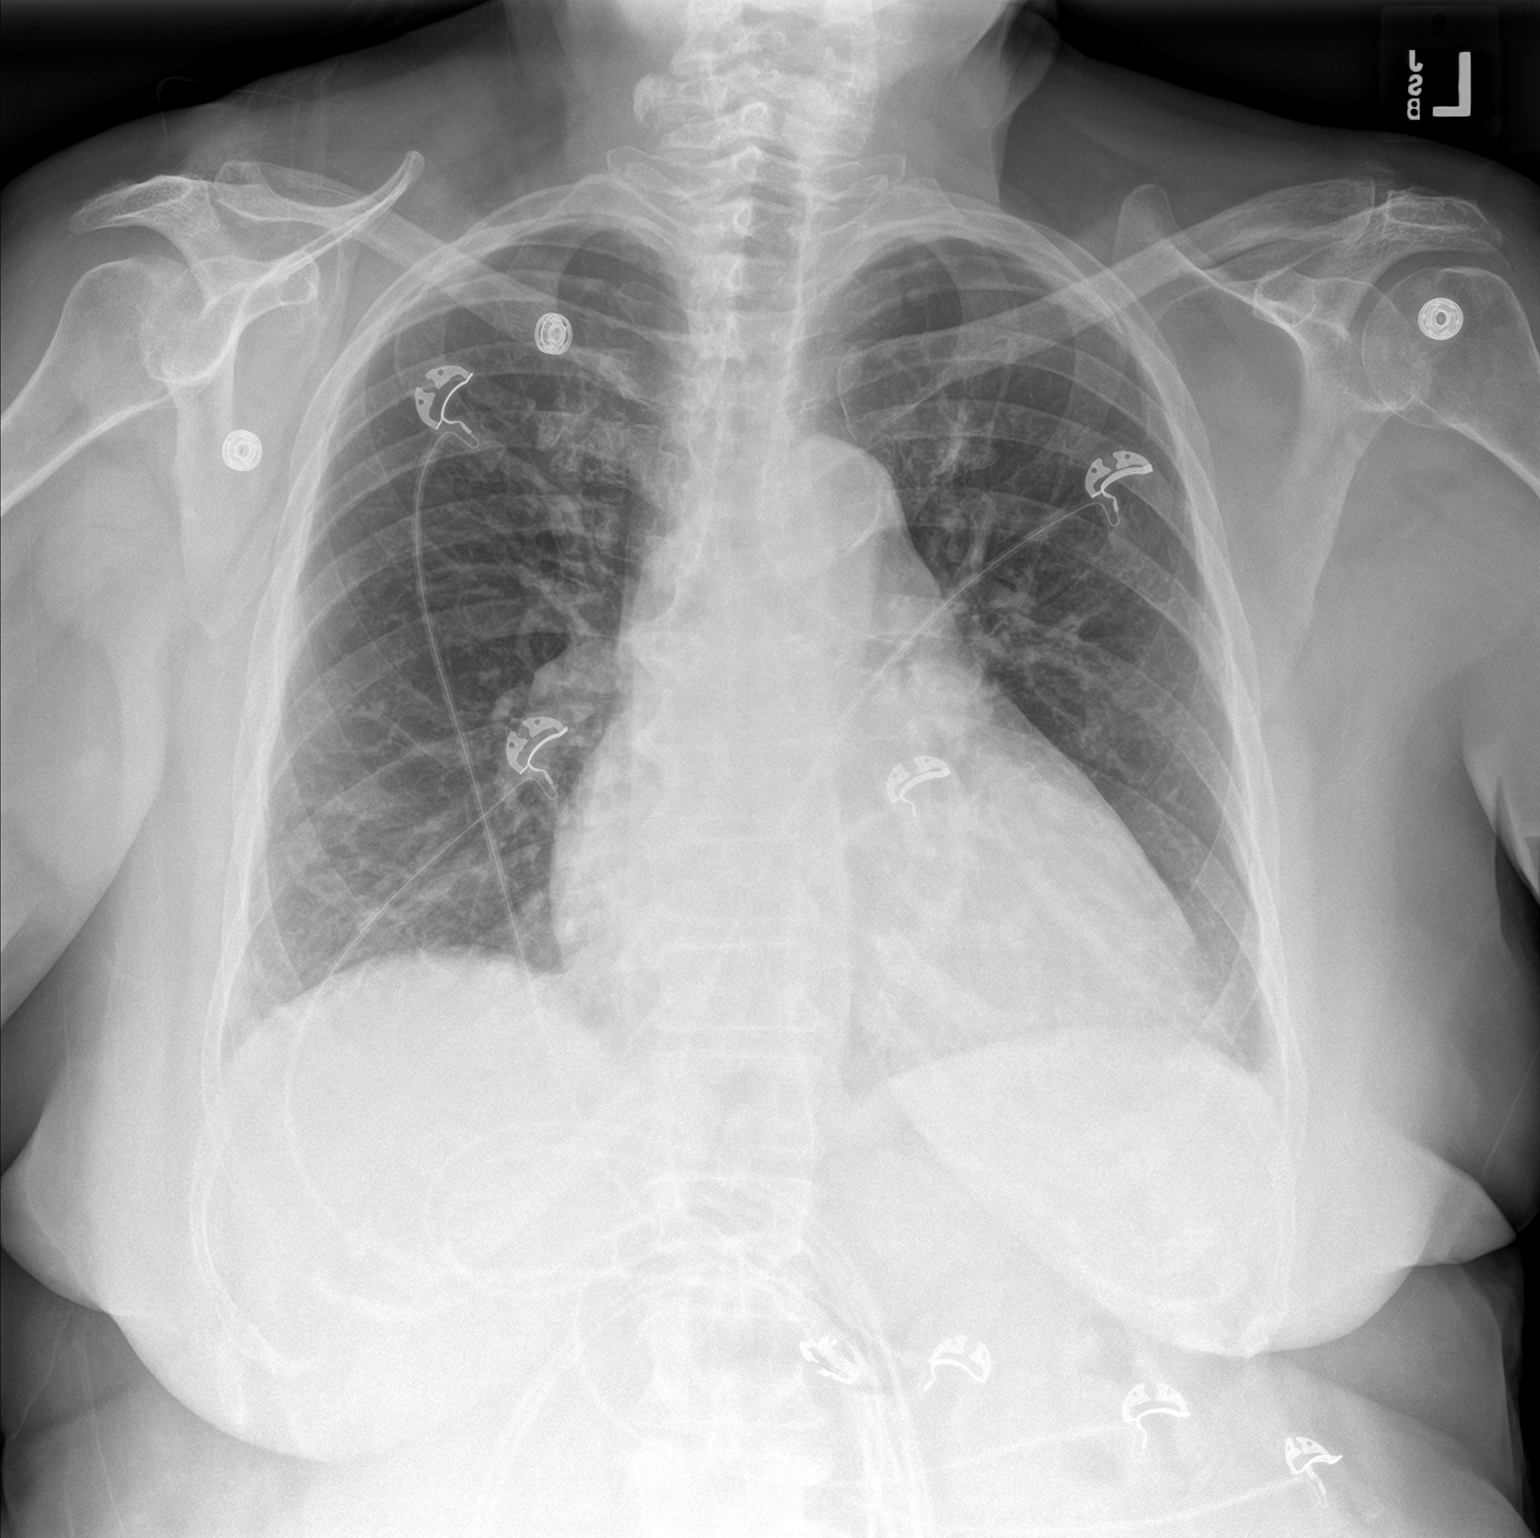

[chest lat]
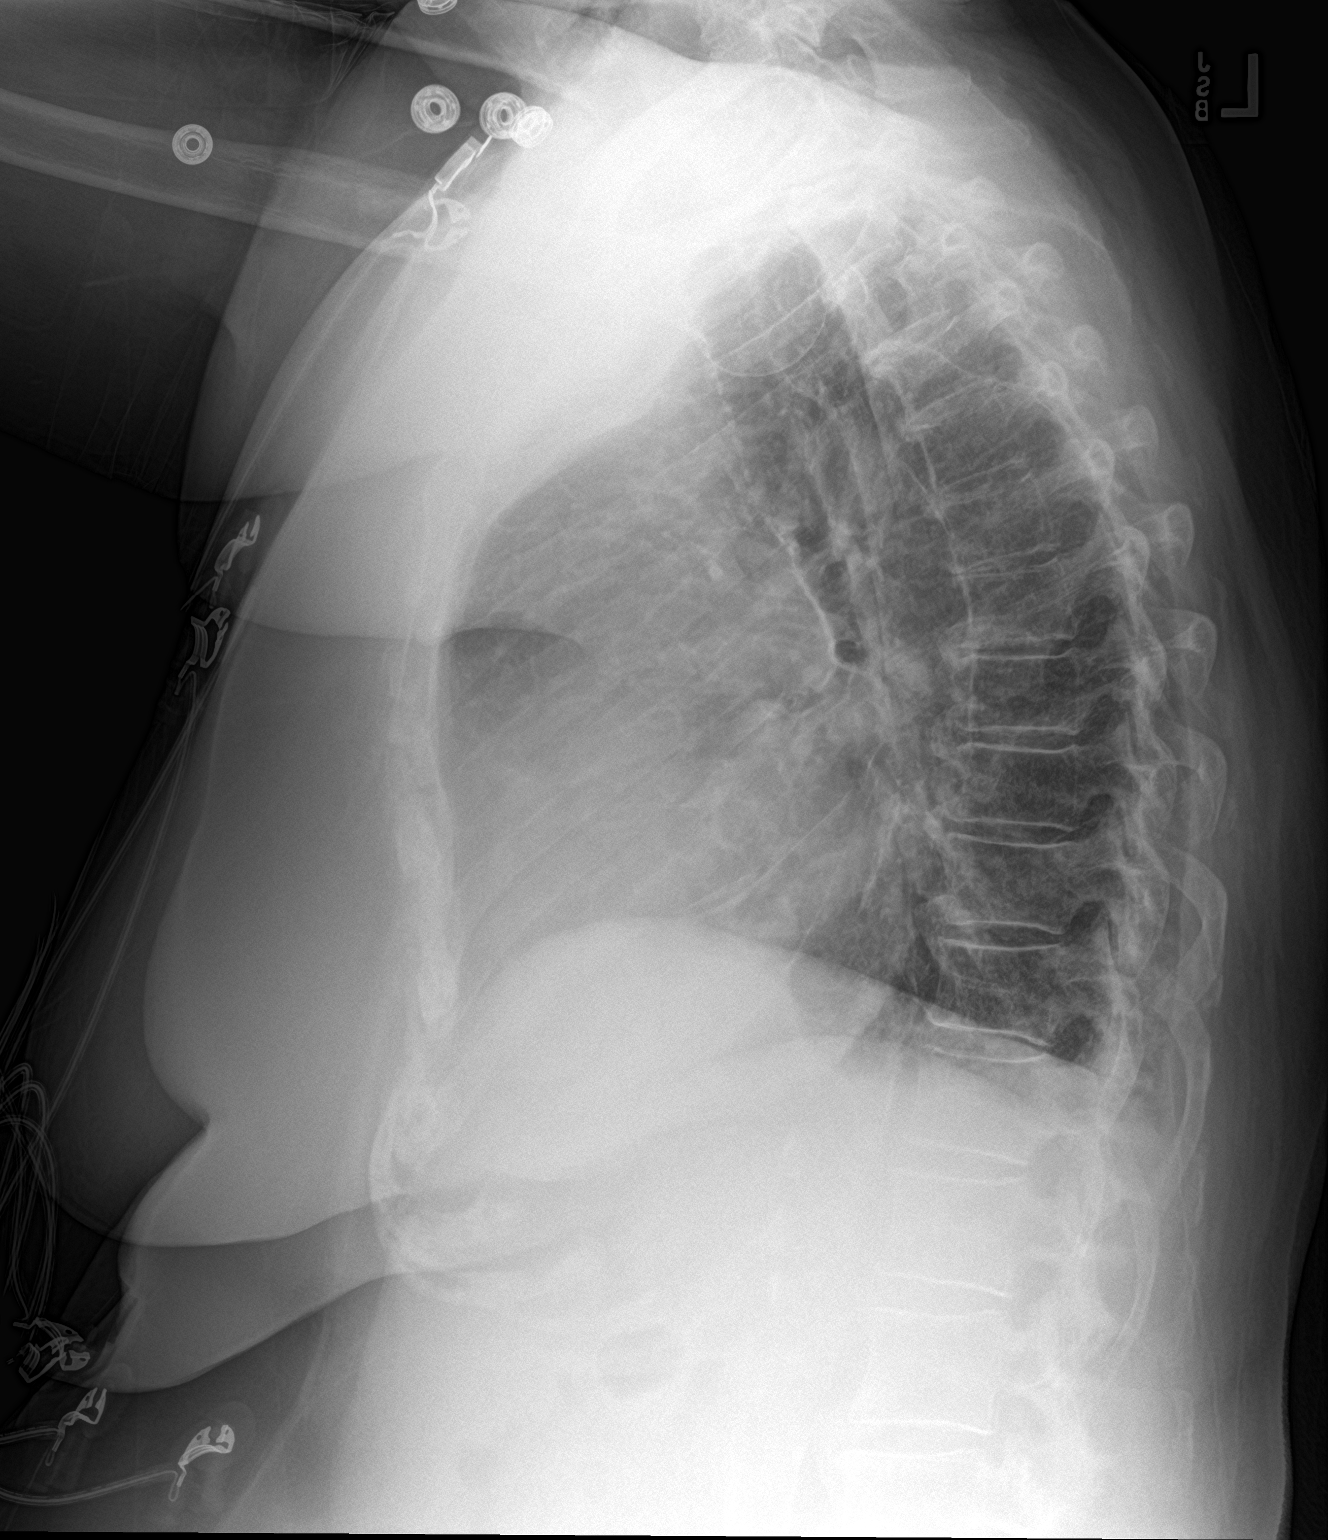

[2 of 2 positions shown; findings below may reference images not displayed]

FINDINGS: Chronic cardiomegaly. Aortic atherosclerosis. Pulmonary vascularity
is normal. There is minimal accentuation of the interstitial
markings at the lung bases. The multiple small pulmonary nodules
noted on prior CT scans are not appreciable on this chest x-ray.

No significant bone abnormality.
IMPRESSION: No acute abnormalities.  Chronic cardiomegaly.

Aortic Atherosclerosis ([id number]).

## 2020-10-09 DIAGNOSIS — Z1339 Encounter for screening examination for other mental health and behavioral disorders: Secondary | ICD-10-CM | POA: Diagnosis not present

## 2020-10-09 DIAGNOSIS — Z1331 Encounter for screening for depression: Secondary | ICD-10-CM | POA: Diagnosis not present

## 2020-10-09 DIAGNOSIS — E7800 Pure hypercholesterolemia, unspecified: Secondary | ICD-10-CM | POA: Diagnosis not present

## 2020-10-09 DIAGNOSIS — R42 Dizziness and giddiness: Secondary | ICD-10-CM | POA: Diagnosis not present

## 2020-10-09 DIAGNOSIS — Z299 Encounter for prophylactic measures, unspecified: Secondary | ICD-10-CM | POA: Diagnosis not present

## 2020-10-09 DIAGNOSIS — Z7189 Other specified counseling: Secondary | ICD-10-CM | POA: Diagnosis not present

## 2020-10-09 DIAGNOSIS — Z79899 Other long term (current) drug therapy: Secondary | ICD-10-CM | POA: Diagnosis not present

## 2020-10-09 DIAGNOSIS — R5383 Other fatigue: Secondary | ICD-10-CM | POA: Diagnosis not present

## 2020-10-09 DIAGNOSIS — Z0000 Encounter for general adult medical examination without abnormal findings: Secondary | ICD-10-CM | POA: Diagnosis not present

## 2020-10-09 DIAGNOSIS — I10 Essential (primary) hypertension: Secondary | ICD-10-CM | POA: Diagnosis not present

## 2020-10-09 DIAGNOSIS — E1165 Type 2 diabetes mellitus with hyperglycemia: Secondary | ICD-10-CM | POA: Diagnosis not present

## 2020-10-09 DIAGNOSIS — Z6831 Body mass index (BMI) 31.0-31.9, adult: Secondary | ICD-10-CM | POA: Diagnosis not present

## 2020-10-09 DIAGNOSIS — I5032 Chronic diastolic (congestive) heart failure: Secondary | ICD-10-CM | POA: Diagnosis not present

## 2020-10-22 DIAGNOSIS — Z96642 Presence of left artificial hip joint: Secondary | ICD-10-CM | POA: Diagnosis not present

## 2020-10-22 DIAGNOSIS — M5136 Other intervertebral disc degeneration, lumbar region: Secondary | ICD-10-CM | POA: Diagnosis not present

## 2020-11-19 DIAGNOSIS — I4891 Unspecified atrial fibrillation: Secondary | ICD-10-CM | POA: Diagnosis not present

## 2020-11-19 DIAGNOSIS — E7800 Pure hypercholesterolemia, unspecified: Secondary | ICD-10-CM | POA: Diagnosis not present

## 2020-11-19 DIAGNOSIS — I129 Hypertensive chronic kidney disease with stage 1 through stage 4 chronic kidney disease, or unspecified chronic kidney disease: Secondary | ICD-10-CM | POA: Diagnosis not present

## 2020-11-19 DIAGNOSIS — D631 Anemia in chronic kidney disease: Secondary | ICD-10-CM | POA: Diagnosis not present

## 2020-11-19 DIAGNOSIS — I480 Paroxysmal atrial fibrillation: Secondary | ICD-10-CM | POA: Diagnosis not present

## 2020-11-19 DIAGNOSIS — N2581 Secondary hyperparathyroidism of renal origin: Secondary | ICD-10-CM | POA: Diagnosis not present

## 2020-11-19 DIAGNOSIS — I10 Essential (primary) hypertension: Secondary | ICD-10-CM | POA: Diagnosis not present

## 2020-11-19 DIAGNOSIS — N184 Chronic kidney disease, stage 4 (severe): Secondary | ICD-10-CM | POA: Diagnosis not present

## 2020-11-19 DIAGNOSIS — N189 Chronic kidney disease, unspecified: Secondary | ICD-10-CM | POA: Diagnosis not present

## 2020-11-19 DIAGNOSIS — I5032 Chronic diastolic (congestive) heart failure: Secondary | ICD-10-CM | POA: Diagnosis not present

## 2020-11-19 DIAGNOSIS — E1122 Type 2 diabetes mellitus with diabetic chronic kidney disease: Secondary | ICD-10-CM | POA: Diagnosis not present

## 2020-11-19 DIAGNOSIS — N390 Urinary tract infection, site not specified: Secondary | ICD-10-CM | POA: Diagnosis not present

## 2020-12-15 DIAGNOSIS — I700 Atherosclerosis of aorta: Secondary | ICD-10-CM | POA: Diagnosis not present

## 2020-12-15 DIAGNOSIS — I10 Essential (primary) hypertension: Secondary | ICD-10-CM | POA: Diagnosis not present

## 2020-12-15 DIAGNOSIS — Z299 Encounter for prophylactic measures, unspecified: Secondary | ICD-10-CM | POA: Diagnosis not present

## 2020-12-15 DIAGNOSIS — E1122 Type 2 diabetes mellitus with diabetic chronic kidney disease: Secondary | ICD-10-CM | POA: Diagnosis not present

## 2020-12-15 DIAGNOSIS — I4891 Unspecified atrial fibrillation: Secondary | ICD-10-CM | POA: Diagnosis not present

## 2020-12-28 IMAGING — CR PORTABLE CHEST - 1 VIEW
1 series · 1 of 1 positions shown · non-contrast
Comparison: Chest x-ray dated August 23, 2018.

CLINICAL DATA: Shortness of breath.  History of CHF.

EXAM:
PORTABLE CHEST 1 VIEW

[AP]
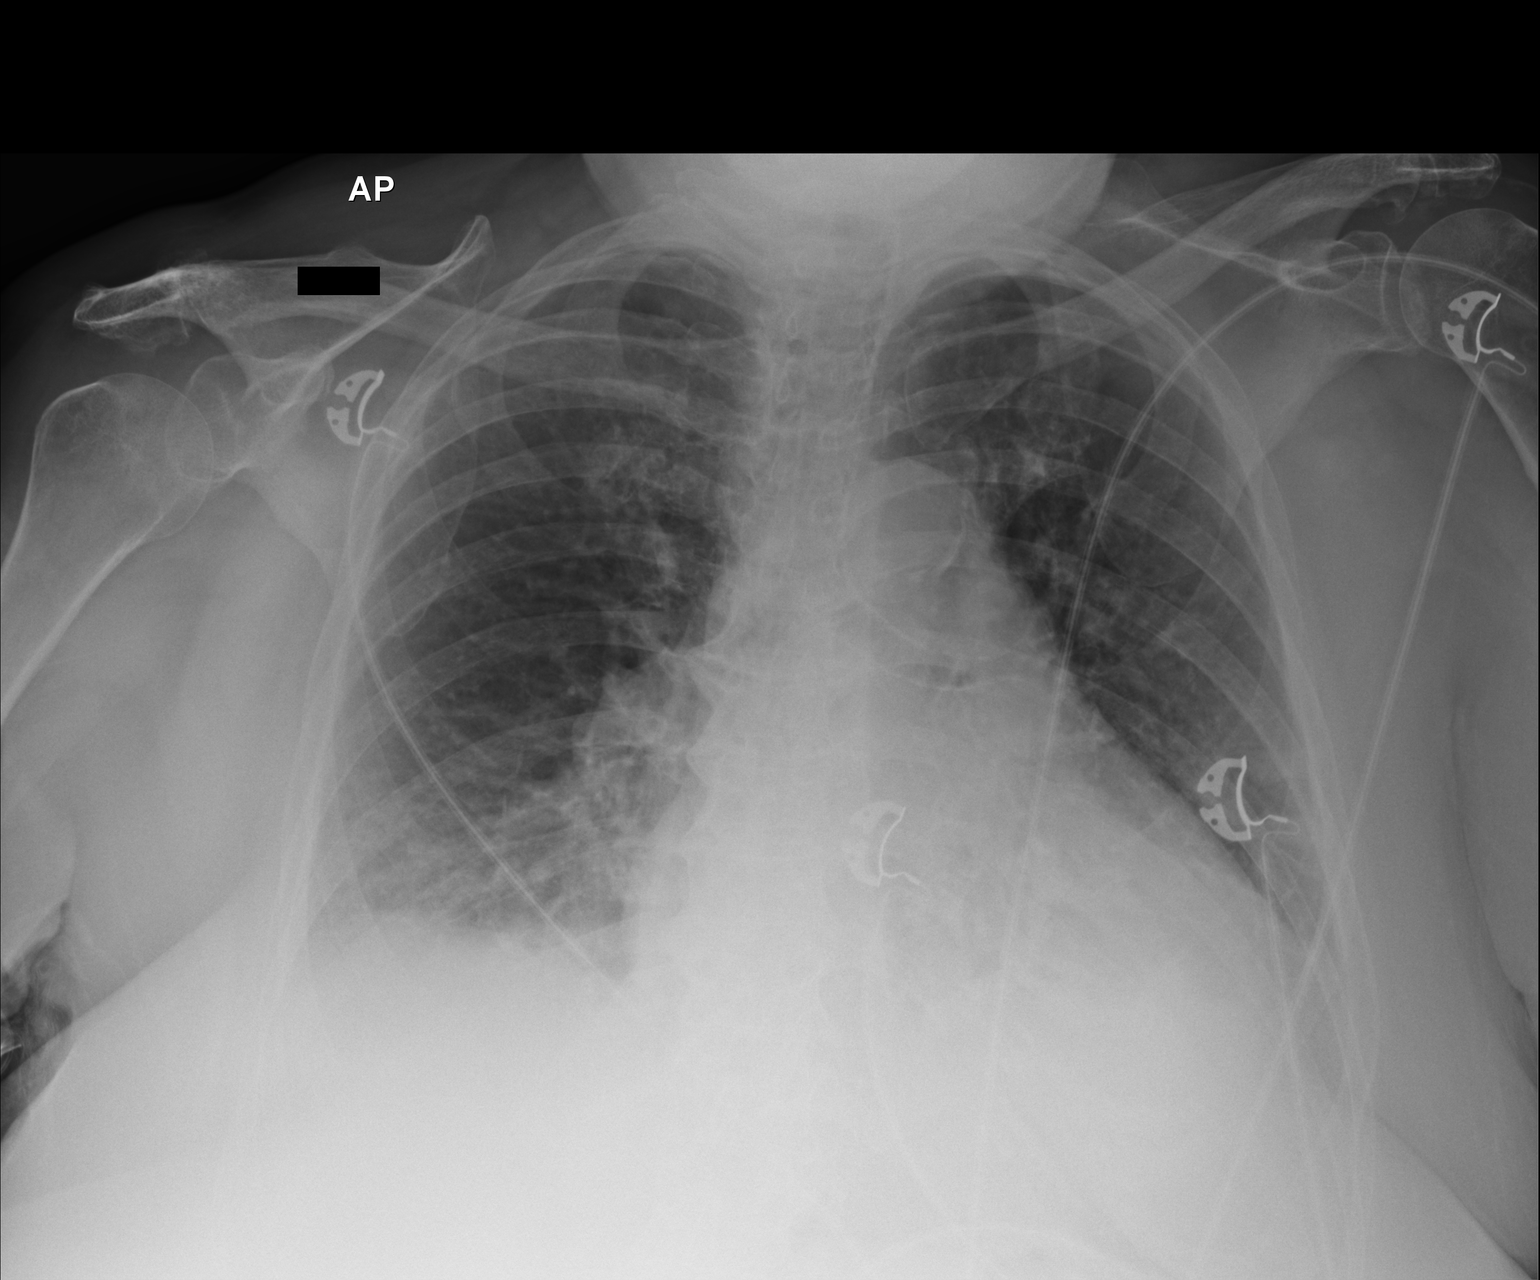

[1 of 1 positions shown; findings below may reference images not displayed]

FINDINGS: Stable mild cardiomegaly. Normal mediastinal contours.
Atherosclerotic calcification of the aortic arch. Normal pulmonary
vascularity. Unchanged small bilateral pleural effusions and mild
bibasilar atelectasis. No pneumothorax. No acute osseous
abnormality.
IMPRESSION: 1. Unchanged small bilateral pleural effusions and mild bibasilar
atelectasis.

## 2021-01-14 DIAGNOSIS — Z23 Encounter for immunization: Secondary | ICD-10-CM | POA: Diagnosis not present

## 2021-01-14 DIAGNOSIS — I10 Essential (primary) hypertension: Secondary | ICD-10-CM | POA: Diagnosis not present

## 2021-01-14 DIAGNOSIS — Z299 Encounter for prophylactic measures, unspecified: Secondary | ICD-10-CM | POA: Diagnosis not present

## 2021-01-14 DIAGNOSIS — N2581 Secondary hyperparathyroidism of renal origin: Secondary | ICD-10-CM | POA: Diagnosis not present

## 2021-01-14 DIAGNOSIS — I5032 Chronic diastolic (congestive) heart failure: Secondary | ICD-10-CM | POA: Diagnosis not present

## 2021-01-14 DIAGNOSIS — E1122 Type 2 diabetes mellitus with diabetic chronic kidney disease: Secondary | ICD-10-CM | POA: Diagnosis not present

## 2021-02-05 DIAGNOSIS — H43812 Vitreous degeneration, left eye: Secondary | ICD-10-CM | POA: Diagnosis not present

## 2021-02-05 DIAGNOSIS — H43392 Other vitreous opacities, left eye: Secondary | ICD-10-CM | POA: Diagnosis not present

## 2021-02-05 DIAGNOSIS — H02132 Senile ectropion of right lower eyelid: Secondary | ICD-10-CM | POA: Diagnosis not present

## 2021-02-05 DIAGNOSIS — H353132 Nonexudative age-related macular degeneration, bilateral, intermediate dry stage: Secondary | ICD-10-CM | POA: Diagnosis not present

## 2021-02-13 ENCOUNTER — Other Ambulatory Visit: Payer: Self-pay | Admitting: Cardiology

## 2021-02-16 DIAGNOSIS — Z008 Encounter for other general examination: Secondary | ICD-10-CM | POA: Diagnosis not present

## 2021-02-16 DIAGNOSIS — R32 Unspecified urinary incontinence: Secondary | ICD-10-CM | POA: Diagnosis not present

## 2021-02-16 DIAGNOSIS — I700 Atherosclerosis of aorta: Secondary | ICD-10-CM | POA: Diagnosis not present

## 2021-02-16 DIAGNOSIS — I4891 Unspecified atrial fibrillation: Secondary | ICD-10-CM | POA: Diagnosis not present

## 2021-02-16 DIAGNOSIS — I509 Heart failure, unspecified: Secondary | ICD-10-CM | POA: Diagnosis not present

## 2021-02-16 DIAGNOSIS — N2581 Secondary hyperparathyroidism of renal origin: Secondary | ICD-10-CM | POA: Diagnosis not present

## 2021-02-16 DIAGNOSIS — E785 Hyperlipidemia, unspecified: Secondary | ICD-10-CM | POA: Diagnosis not present

## 2021-02-16 DIAGNOSIS — D6869 Other thrombophilia: Secondary | ICD-10-CM | POA: Diagnosis not present

## 2021-02-16 DIAGNOSIS — Z794 Long term (current) use of insulin: Secondary | ICD-10-CM | POA: Diagnosis not present

## 2021-02-16 DIAGNOSIS — R030 Elevated blood-pressure reading, without diagnosis of hypertension: Secondary | ICD-10-CM | POA: Diagnosis not present

## 2021-02-16 DIAGNOSIS — M1990 Unspecified osteoarthritis, unspecified site: Secondary | ICD-10-CM | POA: Diagnosis not present

## 2021-02-16 DIAGNOSIS — E1151 Type 2 diabetes mellitus with diabetic peripheral angiopathy without gangrene: Secondary | ICD-10-CM | POA: Diagnosis not present

## 2021-02-16 DIAGNOSIS — I2510 Atherosclerotic heart disease of native coronary artery without angina pectoris: Secondary | ICD-10-CM | POA: Diagnosis not present

## 2021-02-16 DIAGNOSIS — E669 Obesity, unspecified: Secondary | ICD-10-CM | POA: Diagnosis not present

## 2021-02-16 DIAGNOSIS — Z7901 Long term (current) use of anticoagulants: Secondary | ICD-10-CM | POA: Diagnosis not present

## 2021-03-04 ENCOUNTER — Encounter: Payer: Self-pay | Admitting: *Deleted

## 2021-03-04 ENCOUNTER — Ambulatory Visit: Payer: Medicare HMO | Admitting: Cardiology

## 2021-03-04 ENCOUNTER — Encounter: Payer: Self-pay | Admitting: Cardiology

## 2021-03-04 VITALS — BP 126/60 | HR 58 | Ht 63.0 in | Wt 177.0 lb

## 2021-03-04 DIAGNOSIS — I10 Essential (primary) hypertension: Secondary | ICD-10-CM

## 2021-03-04 DIAGNOSIS — I350 Nonrheumatic aortic (valve) stenosis: Secondary | ICD-10-CM | POA: Diagnosis not present

## 2021-03-04 DIAGNOSIS — I5032 Chronic diastolic (congestive) heart failure: Secondary | ICD-10-CM | POA: Diagnosis not present

## 2021-03-04 DIAGNOSIS — I4891 Unspecified atrial fibrillation: Secondary | ICD-10-CM

## 2021-03-04 NOTE — Progress Notes (Signed)
Clinical Summary Miranda Garcia is a 87 y.o.female seen today for follow up of the following medical problems.   1. Chronic diastolic HF  - working to limit diuretic due to her renal dysfunction 04/2018 echo: LVEF  60-65%, normal RV, evidence of diastolic dysfunction   11/3568 echo LVEF 60-65%, indet DDx, mod pulm HTN, severe LAE, mod MR, mod TR, mild to mod AS.      08/2020 echo LVEF 17-79%, indet diastolic fxn, normal RV, severe LAE, small to mod pericardial effusion, mild to mod MR, mod to severe AS   - no recent edmea. Takes lasix 80mg  once daily. Home weights on her scale stable 170-172 - chronic stable SOB/DOE.  - 10/2020 Cr 2.62     2. CKD IV - followed by nephorlogy Dr Miranda Garcia     3. Aortic stenosis - mild to moderate by 04/2018 echo - 04/2019 mild to mod AS   08/2020 echo LVEF 39-03%, indet diastolic fxn, normal RV, severe LAE, small to mod pericardial effusion, mild to mod MR, mod to severe AS(DI 0.34, mean grad 15.4, AVA VTI 0.97, SVI 35.    - overall mod to severe AS heavily favoring the moderate side of the spectrum.   3. Dyspnea - extensive pulmonmary and cardiac workup over the last few months.   Jan 2020 PFTs suggestive of restriction, mildly redcued DLCO but corrects for alveolar ventilation    04/2018 echo: LVEF 60-65%, indeterminate diastolic function, reported moderate aortic stenosis - 05/2018 nuclear stress: no ischemia 05/2018 CT chest high resolution: findings suggesting of interstitial lung disease vs alveolar hemorrage.  05/2018 VQ scan: low probability PE, Patchy retention of the ventilation agent centrally may be seen with air trapping or COPD.   4. HTN - compliant with meds     5. Afib -no recent palpitations - no bleeding on eliquis.    6. Hyperlipidemia - she is on pravastatin, labs followed by pcp - leg pains on atorvastaitn.     Past Medical History:  Diagnosis Date   Arthritis    back, knees   Atrial fibrillation (HCC)    CHF  (congestive heart failure) ([place]) 08/2018   Diabetes mellitus type II, controlled ([place])    x 15 yrs   Dysrhythmia    A-fib   Elevated troponin    Hemorrhoids    History of kidney stones    Hypertension    Neuromuscular disorder (HCC)    numbness in hands    Renal insufficiency    stage iv     Allergies  Allergen Reactions   Atorvastatin Other (See Comments)    Leg pains   Contrast Media [Iodinated Diagnostic Agents]     Pt has 1 full functioning kidney   Penicillins Nausea And Vomiting and Rash    Tolerated Cephalosporin Date: 09/11/19.    Did it involve swelling of the face/tongue/throat, SOB, or low BP? No Did it involve sudden or severe rash/hives, skin peeling, or any reaction on the inside of your mouth or nose? Yes Did you need to seek medical attention at a hospital or doctor's office? Yes When did it last happen?      67 yrs ago If all above answers are "NO", may proceed with cephalosporin use.      Current Outpatient Medications  Medication Sig Dispense Refill   Calcium Carbonate-Vit D-Min (CALTRATE 600+D PLUS MINERALS) 600-800 MG-UNIT TABS Take 1 tablet by mouth daily.     carvedilol (COREG) 12.5 MG tablet Take  25 mg by mouth 2 (two) times daily.     Cholecalciferol (VITAMIN D3) 10 MCG (400 UNIT) tablet Take 400 Units by mouth daily.     diltiazem (CARDIZEM CD) 240 MG 24 hr capsule Take 240 mg by mouth daily.     ELIQUIS 2.5 MG TABS tablet TAKE ONE TABLET BY MOUTH TWICE DAILY 60 tablet 6   furosemide (LASIX) 80 MG tablet Take 80 mg by mouth daily.     insulin aspart protamine- aspart (NOVOLOG MIX 70/30) (70-30) 100 UNIT/ML injection Inject 0.12 mLs (12 Units total) into the skin 2 (two) times a day. (Patient taking differently: Inject 12-15 Units into the skin 2 (two) times a day.) 10 mL 11   Multiple Vitamins-Minerals (PRESERVISION AREDS) CAPS Take 2 capsules by mouth daily.     pravastatin (PRAVACHOL) 20 MG tablet TAKE 1 TABLET DAILY IN THE EVENING 90 tablet 1    No current facility-administered medications for this visit.   Facility-Administered Medications Ordered in Other Visits  Medication Dose Route Frequency Provider Last Rate Last Admin   neomycin-polymyxin-dexameth (MAXITROL) 0.1 % ophth ointment    PRN Miranda Dystany Duffy, MD   1 application at 96/28/36 1427     Past Surgical History:  Procedure Laterality Date   [place]   [facility]   CATARACT EXTRACTION [facility]  01/11/2011   Procedure: CATARACT EXTRACTION PHACO AND INTRAOCULAR LENS PLACEMENT (IOC);  Surgeon: Miranda Garcia;  Location: [facility];  Service: Ophthalmology;  Laterality: Right;  CDE 19.32   CATARACT EXTRACTION W/PHACO  01/21/2011   Procedure: CATARACT EXTRACTION PHACO AND INTRAOCULAR LENS PLACEMENT (IOC);  Surgeon: Miranda Garcia;  Location: [facility];  Service: Ophthalmology;  Laterality: Left;  CDE: 21.19   COLONOSCOPY N/A 09/12/2013   Procedure: COLONOSCOPY;  Surgeon: Miranda Houston, MD;  Location[facility];  Service: Endoscopy;  Laterality: N/A;  [place]   LITHOTRIPSY     APH   PARTIAL HYSTERECTOMY     TOTAL HIP ARTHROPLASTY Left 09/11/2019   Procedure: TOTAL HIP ARTHROPLASTY ANTERIOR APPROACH;  Surgeon: Miranda Cancel, MD;  Location: [facility];  Service: Orthopedics;  Laterality: Left;  70 mins     Allergies  Allergen Reactions   Atorvastatin Other (See Comments)    Leg pains   Contrast Media [Iodinated Diagnostic Agents]     Pt has 1 full functioning kidney   Penicillins Nausea And Vomiting and Rash    Tolerated Cephalosporin Date: 09/11/19.    Did it involve swelling of the face/tongue/throat, SOB, or low BP? No Did it involve sudden or severe rash/hives, skin peeling, or any reaction on the inside of your mouth or nose? Yes Did you need to seek medical attention at a hospital or doctor's office? Yes When did it last happen?      67 yrs ago If all above answers are "NO", may proceed with cephalosporin use.       Family History  Problem  Relation Age of Onset   Colon cancer Brother      Social History Miranda Garcia reports that she has never smoked. She has never used smokeless tobacco. Miranda Garcia reports no history of alcohol use.   Review of Systems CONSTITUTIONAL: No weight loss, fever, chills, weakness or fatigue.  HEENT: Eyes: No visual loss, blurred vision, double vision or yellow sclerae.No hearing loss, sneezing, congestion, runny nose or sore throat.  SKIN: No rash or itching.  CARDIOVASCULAR: per hpi RESPIRATORY: No  shortness of breath, cough or sputum.  GASTROINTESTINAL: No anorexia, nausea, vomiting or diarrhea. No abdominal pain or blood.  GENITOURINARY: No burning on urination, no polyuria NEUROLOGICAL: No headache, dizziness, syncope, paralysis, ataxia, numbness or tingling in the extremities. No change in bowel or bladder control.  MUSCULOSKELETAL: No muscle, back pain, joint pain or stiffness.  LYMPHATICS: No enlarged nodes. No history of splenectomy.  PSYCHIATRIC: No history of depression or anxiety.  ENDOCRINOLOGIC: No reports of sweating, cold or heat intolerance. No polyuria or polydipsia.  Marland Kitchen   Physical Examination Today's Vitals   03/04/21 1248  BP: 126/60  Pulse: (!) 58  SpO2: 97%  Weight: 177 lb (80.3 kg)  Height: 5\' 3"  (1.6 m)   Body mass index is 31.35 kg/m.  Gen: resting comfortably, no acute distress HEENT: no scleral icterus, pupils equal round and reactive, no palptable cervical adenopathy,  CV: irreg, 3/6 systolc murmur rusb, no jvd Resp: Clear to auscultation bilaterally GI: abdomen is soft, non-tender, non-distended, normal bowel sounds, no hepatosplenomegaly MSK: extremities are warm, no edema.  Skin: warm, no rash Neuro:  no focal deficits Psych: appropriate affect   Diagnostic Studies  04/2019 echo IMPRESSIONS     1. Left ventricular ejection fraction, by estimation, is 60 to 65%. The  left ventricle has normal function. The left ventricle has no regional   wall motion abnormalities. There is mild concentric left ventricular  hypertrophy. Left ventricular diastolic  parameters are indeterminate. Elevated left ventricular end-diastolic  pressure.   2. Right ventricular systolic function is low normal. The right  ventricular size is normal. There is moderately elevated pulmonary artery  systolic pressure.   3. Left atrial size was severely dilated.   4. Right atrial size was mildly dilated.   5. The mitral valve is degenerative. Moderate mitral valve regurgitation.   6. Tricuspid valve regurgitation is moderate.   7. Aortic valve is functionally bicuspid. Morphologically, there appears  to be moderate calcific aortic stenosis but peak velocities and gradients  are lower (in mild range). The aortic valve is abnormal. Aortic valve  regurgitation is not visualized.  Mild to moderate aortic valve stenosis. Aortic valve area, by VTI measures  1.10 cm. Aortic valve mean gradient measures 11.5 mmHg. Aortic valve Vmax  measures 2.22 m/s.   8. The inferior vena cava is normal in size with <50% respiratory  variability, suggesting right atrial pressure of 8 mmHg.     08/2020 echo LVEF 33-83%, indet diastolic fxn, normal RV, severe LAE, small to mod pericardial effusion, mild to mod MR, mod to severe AS   Assessment and Plan   1. Chronic diastolic HF - euvolemic without symptoms, continue current meds     2. Aortic stenosis -moderate to severe AS, continue to monitor. Repeat echo next year  3. Afib - no symptoms, continue current meds including eliquis for stroke prevention   4. HTN - at goal, continue current meds    Arnoldo Lenis, M.D.

## 2021-03-04 NOTE — Patient Instructions (Addendum)
Medication Instructions:  Continue all current medications.   Labwork: none  Testing/Procedures: none  Follow-Up: 6 months   Any Other Special Instructions Will Be Listed Below (If Applicable).   If you need a refill on your cardiac medications before your next appointment, please call your pharmacy.  

## 2021-04-01 DIAGNOSIS — E119 Type 2 diabetes mellitus without complications: Secondary | ICD-10-CM | POA: Diagnosis not present

## 2021-04-08 DIAGNOSIS — I480 Paroxysmal atrial fibrillation: Secondary | ICD-10-CM | POA: Diagnosis not present

## 2021-04-08 DIAGNOSIS — I129 Hypertensive chronic kidney disease with stage 1 through stage 4 chronic kidney disease, or unspecified chronic kidney disease: Secondary | ICD-10-CM | POA: Diagnosis not present

## 2021-04-08 DIAGNOSIS — N2581 Secondary hyperparathyroidism of renal origin: Secondary | ICD-10-CM | POA: Diagnosis not present

## 2021-04-08 DIAGNOSIS — H02135 Senile ectropion of left lower eyelid: Secondary | ICD-10-CM | POA: Diagnosis not present

## 2021-04-08 DIAGNOSIS — H04123 Dry eye syndrome of bilateral lacrimal glands: Secondary | ICD-10-CM | POA: Diagnosis not present

## 2021-04-08 DIAGNOSIS — N189 Chronic kidney disease, unspecified: Secondary | ICD-10-CM | POA: Diagnosis not present

## 2021-04-08 DIAGNOSIS — H02115 Cicatricial ectropion of left lower eyelid: Secondary | ICD-10-CM | POA: Diagnosis not present

## 2021-04-08 DIAGNOSIS — H02132 Senile ectropion of right lower eyelid: Secondary | ICD-10-CM | POA: Diagnosis not present

## 2021-04-08 DIAGNOSIS — N184 Chronic kidney disease, stage 4 (severe): Secondary | ICD-10-CM | POA: Diagnosis not present

## 2021-04-08 DIAGNOSIS — H02112 Cicatricial ectropion of right lower eyelid: Secondary | ICD-10-CM | POA: Diagnosis not present

## 2021-04-08 DIAGNOSIS — H16213 Exposure keratoconjunctivitis, bilateral: Secondary | ICD-10-CM | POA: Diagnosis not present

## 2021-04-08 DIAGNOSIS — D631 Anemia in chronic kidney disease: Secondary | ICD-10-CM | POA: Diagnosis not present

## 2021-04-08 DIAGNOSIS — H02222 Mechanical lagophthalmos right lower eyelid: Secondary | ICD-10-CM | POA: Diagnosis not present

## 2021-04-08 DIAGNOSIS — H02532 Eyelid retraction right lower eyelid: Secondary | ICD-10-CM | POA: Diagnosis not present

## 2021-04-08 DIAGNOSIS — E1122 Type 2 diabetes mellitus with diabetic chronic kidney disease: Secondary | ICD-10-CM | POA: Diagnosis not present

## 2021-04-08 DIAGNOSIS — I5032 Chronic diastolic (congestive) heart failure: Secondary | ICD-10-CM | POA: Diagnosis not present

## 2021-04-08 DIAGNOSIS — H16211 Exposure keratoconjunctivitis, right eye: Secondary | ICD-10-CM | POA: Diagnosis not present

## 2021-04-08 DIAGNOSIS — H02535 Eyelid retraction left lower eyelid: Secondary | ICD-10-CM | POA: Diagnosis not present

## 2021-04-13 ENCOUNTER — Other Ambulatory Visit: Payer: Self-pay | Admitting: Cardiology

## 2021-04-13 NOTE — Telephone Encounter (Signed)
Prescription refill request for Eliquis received. Indication: Atrial fib Last office visit: 03/04/21  Zandra Abts MD Scr: 2.39 on 01/14/21 Age: 88 Weight: 80.3kg  Based on above findings Eliquis 2.5mg  twice daily is the appropriate dose.  Refill approved.

## 2021-04-21 DIAGNOSIS — I10 Essential (primary) hypertension: Secondary | ICD-10-CM | POA: Diagnosis not present

## 2021-04-21 DIAGNOSIS — E782 Mixed hyperlipidemia: Secondary | ICD-10-CM | POA: Diagnosis not present

## 2021-04-29 DIAGNOSIS — I10 Essential (primary) hypertension: Secondary | ICD-10-CM | POA: Diagnosis not present

## 2021-04-29 DIAGNOSIS — J449 Chronic obstructive pulmonary disease, unspecified: Secondary | ICD-10-CM | POA: Diagnosis not present

## 2021-04-29 DIAGNOSIS — Z299 Encounter for prophylactic measures, unspecified: Secondary | ICD-10-CM | POA: Diagnosis not present

## 2021-04-29 DIAGNOSIS — E1165 Type 2 diabetes mellitus with hyperglycemia: Secondary | ICD-10-CM | POA: Diagnosis not present

## 2021-04-29 DIAGNOSIS — I5032 Chronic diastolic (congestive) heart failure: Secondary | ICD-10-CM | POA: Diagnosis not present

## 2021-04-29 DIAGNOSIS — E1122 Type 2 diabetes mellitus with diabetic chronic kidney disease: Secondary | ICD-10-CM | POA: Diagnosis not present

## 2021-05-04 DIAGNOSIS — I739 Peripheral vascular disease, unspecified: Secondary | ICD-10-CM | POA: Diagnosis not present

## 2021-07-09 ENCOUNTER — Encounter: Payer: Self-pay | Admitting: Cardiology

## 2021-07-09 DIAGNOSIS — N2581 Secondary hyperparathyroidism of renal origin: Secondary | ICD-10-CM | POA: Diagnosis not present

## 2021-07-09 DIAGNOSIS — I129 Hypertensive chronic kidney disease with stage 1 through stage 4 chronic kidney disease, or unspecified chronic kidney disease: Secondary | ICD-10-CM | POA: Diagnosis not present

## 2021-07-09 DIAGNOSIS — E1122 Type 2 diabetes mellitus with diabetic chronic kidney disease: Secondary | ICD-10-CM | POA: Diagnosis not present

## 2021-07-09 DIAGNOSIS — I480 Paroxysmal atrial fibrillation: Secondary | ICD-10-CM | POA: Diagnosis not present

## 2021-07-09 DIAGNOSIS — D631 Anemia in chronic kidney disease: Secondary | ICD-10-CM | POA: Diagnosis not present

## 2021-07-09 DIAGNOSIS — N390 Urinary tract infection, site not specified: Secondary | ICD-10-CM | POA: Diagnosis not present

## 2021-07-09 DIAGNOSIS — N189 Chronic kidney disease, unspecified: Secondary | ICD-10-CM | POA: Diagnosis not present

## 2021-07-09 DIAGNOSIS — N184 Chronic kidney disease, stage 4 (severe): Secondary | ICD-10-CM | POA: Diagnosis not present

## 2021-07-09 DIAGNOSIS — I5032 Chronic diastolic (congestive) heart failure: Secondary | ICD-10-CM | POA: Diagnosis not present

## 2021-07-09 DIAGNOSIS — E119 Type 2 diabetes mellitus without complications: Secondary | ICD-10-CM | POA: Diagnosis not present

## 2021-08-04 DIAGNOSIS — Z299 Encounter for prophylactic measures, unspecified: Secondary | ICD-10-CM | POA: Diagnosis not present

## 2021-08-04 DIAGNOSIS — Z6832 Body mass index (BMI) 32.0-32.9, adult: Secondary | ICD-10-CM | POA: Diagnosis not present

## 2021-08-04 DIAGNOSIS — I10 Essential (primary) hypertension: Secondary | ICD-10-CM | POA: Diagnosis not present

## 2021-08-04 DIAGNOSIS — E1165 Type 2 diabetes mellitus with hyperglycemia: Secondary | ICD-10-CM | POA: Diagnosis not present

## 2021-08-04 DIAGNOSIS — N184 Chronic kidney disease, stage 4 (severe): Secondary | ICD-10-CM | POA: Diagnosis not present

## 2021-08-04 DIAGNOSIS — E1122 Type 2 diabetes mellitus with diabetic chronic kidney disease: Secondary | ICD-10-CM | POA: Diagnosis not present

## 2021-08-04 DIAGNOSIS — I5032 Chronic diastolic (congestive) heart failure: Secondary | ICD-10-CM | POA: Diagnosis not present

## 2021-08-06 ENCOUNTER — Other Ambulatory Visit: Payer: Self-pay | Admitting: Cardiology

## 2021-08-06 DIAGNOSIS — E119 Type 2 diabetes mellitus without complications: Secondary | ICD-10-CM | POA: Diagnosis not present

## 2021-08-06 DIAGNOSIS — H353132 Nonexudative age-related macular degeneration, bilateral, intermediate dry stage: Secondary | ICD-10-CM | POA: Diagnosis not present

## 2021-08-06 DIAGNOSIS — H43392 Other vitreous opacities, left eye: Secondary | ICD-10-CM | POA: Diagnosis not present

## 2021-08-06 DIAGNOSIS — H43812 Vitreous degeneration, left eye: Secondary | ICD-10-CM | POA: Diagnosis not present

## 2021-08-25 DIAGNOSIS — H524 Presbyopia: Secondary | ICD-10-CM | POA: Diagnosis not present

## 2021-08-31 DIAGNOSIS — I509 Heart failure, unspecified: Secondary | ICD-10-CM | POA: Diagnosis not present

## 2021-08-31 DIAGNOSIS — M13849 Other specified arthritis, unspecified hand: Secondary | ICD-10-CM | POA: Diagnosis not present

## 2021-08-31 DIAGNOSIS — G5603 Carpal tunnel syndrome, bilateral upper limbs: Secondary | ICD-10-CM | POA: Diagnosis not present

## 2021-08-31 DIAGNOSIS — E119 Type 2 diabetes mellitus without complications: Secondary | ICD-10-CM | POA: Diagnosis not present

## 2021-09-03 ENCOUNTER — Ambulatory Visit: Payer: Medicare HMO | Admitting: Cardiology

## 2021-09-03 ENCOUNTER — Encounter: Payer: Self-pay | Admitting: Cardiology

## 2021-09-03 ENCOUNTER — Encounter: Payer: Self-pay | Admitting: *Deleted

## 2021-09-03 VITALS — BP 132/68 | HR 76 | Ht 63.0 in | Wt 176.6 lb

## 2021-09-03 DIAGNOSIS — I5032 Chronic diastolic (congestive) heart failure: Secondary | ICD-10-CM | POA: Diagnosis not present

## 2021-09-03 DIAGNOSIS — I350 Nonrheumatic aortic (valve) stenosis: Secondary | ICD-10-CM

## 2021-09-03 DIAGNOSIS — I6529 Occlusion and stenosis of unspecified carotid artery: Secondary | ICD-10-CM

## 2021-09-03 DIAGNOSIS — I4891 Unspecified atrial fibrillation: Secondary | ICD-10-CM

## 2021-09-03 NOTE — Patient Instructions (Signed)
Medication Instructions:  Continue all current medications.  Labwork: none  Testing/Procedures: Your physician has requested that you have an echocardiogram. Echocardiography is a painless test that uses sound waves to create images of your heart. It provides your doctor with information about the size and shape of your heart and how well your heart's chambers and valves are working. This procedure takes approximately one hour. There are no restrictions for this procedure.  Your physician has requested that you have a carotid duplex. This test is an ultrasound of the carotid arteries in your neck. It looks at blood flow through these arteries that supply the brain with blood. Allow one hour for this exam. There are no restrictions or special instructions.  Office will contact with results via phone or letter.     Follow-Up: 6 months   Any Other Special Instructions Will Be Listed Below (If Applicable).   If you need a refill on your cardiac medications before your next appointment, please call your pharmacy.

## 2021-09-03 NOTE — Progress Notes (Signed)
Clinical Summary Miranda Garcia is a 88 y.o.female seen today for follow up of the following medical problems.    1. Chronic diastolic HF  - working to limit diuretic due to her renal dysfunction 04/2018 echo: LVEF  60-65%, normal RV, evidence of diastolic dysfunction   08/628 echo LVEF 60-65%, indet DDx, mod pulm HTN, severe LAE, mod MR, mod TR, mild to mod AS.      08/2020 echo LVEF 16-01%, indet diastolic fxn, normal RV, severe LAE, small to mod pericardial effusion, mild to mod MR, mod to severe AS    - chronic stable SOB/DOE. No recent edema - compliant with meds     2. CKD IV - followed by nephorlogy Dr Hollie Salk     3. Aortic stenosis - mild to moderate by 04/2018 echo - 04/2019 mild to mod AS   08/2020 echo LVEF 09-32%, indet diastolic fxn, normal RV, severe LAE, small to mod pericardial effusion, mild to mod MR, mod to severe AS(DI 0.34, mean grad 15.4, AVA VTI 0.97, SVI 35.    - overall mod to severe AS heavily favoring the moderate side of the spectrum.   - no chest pains, chronic SOB/DOE, no presyncope/syncope - sedetnary lifestyle, does light housework. Mainly limited by chronic back pain.     3. Dyspnea - extensive pulmonmary and cardiac workup over the last few months.   Jan 2020 PFTs suggestive of restriction, mildly redcued DLCO but corrects for alveolar ventilation    04/2018 echo: LVEF 60-65%, indeterminate diastolic function, reported moderate aortic stenosis - 05/2018 nuclear stress: no ischemia 05/2018 CT chest high resolution: findings suggesting of interstitial lung disease vs alveolar hemorrage.  05/2018 VQ scan: low probability PE, Patchy retention of the ventilation agent centrally may be seen with air trapping or COPD.     4. HTN - compliant with meds     5. Afib -no palpitatins - no bleeding on eliquis   6. Hyperlipidemia - she is on pravastatin, labs followed by pcp - leg pains on atorvastaitn.  02/2021 TC 107 TG 100 HDL 51 LDL 37     7. Preop evaluation - considering carpal tunnel sugery under local anesthesia    Past Medical History:  Diagnosis Date   Arthritis    back, knees   Atrial fibrillation (HCC)    CHF (congestive heart failure) ([place]) 08/2018   Diabetes mellitus type II, controlled ([place])    x 15 yrs   Dysrhythmia    A-fib   Elevated troponin    Hemorrhoids    History of kidney stones    Hypertension    Neuromuscular disorder (HCC)    numbness in hands    Renal insufficiency    stage iv     Allergies  Allergen Reactions   Atorvastatin Other (See Comments)    Leg pains   Contrast Media [Iodinated Contrast Media]     Pt has 1 full functioning kidney   Penicillins Nausea And Vomiting and Rash    Tolerated Cephalosporin Date: 09/11/19.    Did it involve swelling of the face/tongue/throat, SOB, or low BP? No Did it involve sudden or severe rash/hives, skin peeling, or any reaction on the inside of your mouth or nose? Yes Did you need to seek medical attention at a hospital or doctor's office? Yes When did it last happen?      65 yrs ago If all above answers are "NO", may proceed with cephalosporin use.      Current  Outpatient Medications  Medication Sig Dispense Refill   apixaban (ELIQUIS) 2.5 MG TABS tablet TAKE ONE TABLET BY MOUTH TWICE DAILY 60 tablet 5   Calcium Carbonate-Vit D-Min (CALTRATE 600+D PLUS MINERALS) 600-800 MG-UNIT TABS Take 1 tablet by mouth daily.     carvedilol (COREG) 12.5 MG tablet Take 12.5 mg by mouth 2 (two) times daily.     Cholecalciferol (VITAMIN D3) 10 MCG (400 UNIT) tablet Take 400 Units by mouth daily.     diltiazem (CARDIZEM CD) 240 MG 24 hr capsule Take 240 mg by mouth daily.     furosemide (LASIX) 80 MG tablet Take 80 mg by mouth daily.     insulin aspart protamine- aspart (NOVOLOG MIX 70/30) (70-30) 100 UNIT/ML injection Inject 0.12 mLs (12 Units total) into the skin 2 (two) times a day. (Patient taking differently: Inject 12-15 Units into the skin 2  (two) times a day.) 10 mL 11   Multiple Vitamins-Minerals (PRESERVISION AREDS) CAPS Take 2 capsules by mouth daily.     pravastatin (PRAVACHOL) 20 MG tablet TAKE 1 TABLET DAILY IN THE EVENING 90 tablet 1   No current facility-administered medications for this visit.   Facility-Administered Medications Ordered in Other Visits  Medication Dose Route Frequency Provider Last Rate Last Admin   neomycin-polymyxin-dexameth (MAXITROL) 0.1 % ophth ointment    PRN Tonny Jadis Pitter, MD   1 application  at 16/10/96 1427     Past Surgical History:  Procedure Laterality Date   [place]   [facility]   CATARACT EXTRACTION [facility]  01/11/2011   Procedure: CATARACT EXTRACTION PHACO AND INTRAOCULAR LENS PLACEMENT (IOC);  Surgeon: Tonny Saron Tweed;  Location: [facility];  Service: Ophthalmology;  Laterality: Right;  CDE 19.32   CATARACT EXTRACTION W/PHACO  01/21/2011   Procedure: CATARACT EXTRACTION PHACO AND INTRAOCULAR LENS PLACEMENT (IOC);  Surgeon: Tonny Darlyn Repsher;  Location: [facility];  Service: Ophthalmology;  Laterality: Left;  CDE: 21.19   COLONOSCOPY N/A 09/12/2013   Procedure: COLONOSCOPY;  Surgeon: Rogene Houston, MD;  Location: [facility];  Service: Endoscopy;  Laterality: N/A;  [place]   LITHOTRIPSY     APH   PARTIAL HYSTERECTOMY     TOTAL HIP ARTHROPLASTY Left 09/11/2019   Procedure: TOTAL HIP ARTHROPLASTY ANTERIOR APPROACH;  Surgeon: Paralee Cancel, MD;  Location[facility];  Service: Orthopedics;  Laterality: Left;  70 mins     Allergies  Allergen Reactions   Atorvastatin Other (See Comments)    Leg pains   Contrast Media [Iodinated Contrast Media]     Pt has 1 full functioning kidney   Penicillins Nausea And Vomiting and Rash    Tolerated Cephalosporin Date: 09/11/19.    Did it involve swelling of the face/tongue/throat, SOB, or low BP? No Did it involve sudden or severe rash/hives, skin peeling, or any reaction on the inside of your mouth or nose? Yes Did you need to  seek medical attention at a hospital or doctor's office? Yes When did it last happen?      65 yrs ago If all above answers are "NO", may proceed with cephalosporin use.       Family History  Problem Relation Age of Onset   Colon cancer Brother      Social History Ms. Osland reports that she has never smoked. She has never used smokeless tobacco. Ms. Liao reports no history of alcohol use.   Review of Systems CONSTITUTIONAL: No weight loss, fever, chills,  weakness or fatigue.  HEENT: Eyes: No visual loss, blurred vision, double vision or yellow sclerae.No hearing loss, sneezing, congestion, runny nose or sore throat.  SKIN: No rash or itching.  CARDIOVASCULAR: per hpi RESPIRATORY: per hpi GASTROINTESTINAL: No anorexia, nausea, vomiting or diarrhea. No abdominal pain or blood.  GENITOURINARY: No burning on urination, no polyuria NEUROLOGICAL: No headache, dizziness, syncope, paralysis, ataxia, numbness or tingling in the extremities. No change in bowel or bladder control.  MUSCULOSKELETAL: No muscle, back pain, joint pain or stiffness.  LYMPHATICS: No enlarged nodes. No history of splenectomy.  PSYCHIATRIC: No history of depression or anxiety.  ENDOCRINOLOGIC: No reports of sweating, cold or heat intolerance. No polyuria or polydipsia.  Marland Kitchen   Physical Examination Today's Vitals   09/03/21 1250  BP: 132/68  Pulse: 76  SpO2: 98%  Weight: 176 lb 9.6 oz (80.1 kg)  Height: '5\' 3"'$  (1.6 m)   Body mass index is 31.28 kg/m.  Gen: resting comfortably, no acute distress HEENT: no scleral icterus, pupils equal round and reactive, no palptable cervical adenopathy,  CV: irreg, 3/6 systolic murmur rusb, no jvd. +bilateral carotid bruits Resp: Clear to auscultation bilaterally GI: abdomen is soft, non-tender, non-distended, normal bowel sounds, no hepatosplenomegaly MSK: extremities are warm, no edema.  Skin: warm, no rash Neuro:  no focal deficits Psych: appropriate  affect   Diagnostic Studies  04/2019 echo IMPRESSIONS     1. Left ventricular ejection fraction, by estimation, is 60 to 65%. The  left ventricle has normal function. The left ventricle has no regional  wall motion abnormalities. There is mild concentric left ventricular  hypertrophy. Left ventricular diastolic  parameters are indeterminate. Elevated left ventricular end-diastolic  pressure.   2. Right ventricular systolic function is low normal. The right  ventricular size is normal. There is moderately elevated pulmonary artery  systolic pressure.   3. Left atrial size was severely dilated.   4. Right atrial size was mildly dilated.   5. The mitral valve is degenerative. Moderate mitral valve regurgitation.   6. Tricuspid valve regurgitation is moderate.   7. Aortic valve is functionally bicuspid. Morphologically, there appears  to be moderate calcific aortic stenosis but peak velocities and gradients  are lower (in mild range). The aortic valve is abnormal. Aortic valve  regurgitation is not visualized.  Mild to moderate aortic valve stenosis. Aortic valve area, by VTI measures  1.10 cm. Aortic valve mean gradient measures 11.5 mmHg. Aortic valve Vmax  measures 2.22 m/s.   8. The inferior vena cava is normal in size with <50% respiratory  variability, suggesting right atrial pressure of 8 mmHg.     08/2020 echo LVEF 43-15%, indet diastolic fxn, normal RV, severe LAE, small to mod pericardial effusion, mild to mod MR, mod to severe AS     Assessment and Plan     1. Chronic diastolic HF - doing well on current lasix dose, no symptoms. Continue current meds     2. Aortic stenosis -moderate to severe AS by prior echo - due for repeat echo, will order   3. Afib/acquired thrombophlia - no recent symptoms, continue current meds including eliquis for stroke prevention   4. HTN - bp at goal, continue current meds  5. Preop evaluation - considerint carpal tunnel  surgery under local anesthesia. Likely to clear for procedure but she is due for repeat echo and would wait for results primarily to see how her aortic valve is doing. In general would look to avoid general anesthesia  in the future unless urgent procedure given the status of her aortic valve, low risk procedures with local anesthesia would be acceptable in the future.   6. Carotid stenosis - repeat carotid US   Arnoldo Lenis, M.D

## 2021-09-07 DIAGNOSIS — G5600 Carpal tunnel syndrome, unspecified upper limb: Secondary | ICD-10-CM | POA: Diagnosis not present

## 2021-09-07 DIAGNOSIS — I5032 Chronic diastolic (congestive) heart failure: Secondary | ICD-10-CM | POA: Diagnosis not present

## 2021-09-07 DIAGNOSIS — Z6832 Body mass index (BMI) 32.0-32.9, adult: Secondary | ICD-10-CM | POA: Diagnosis not present

## 2021-09-07 DIAGNOSIS — Z299 Encounter for prophylactic measures, unspecified: Secondary | ICD-10-CM | POA: Diagnosis not present

## 2021-09-07 DIAGNOSIS — Z789 Other specified health status: Secondary | ICD-10-CM | POA: Diagnosis not present

## 2021-09-07 DIAGNOSIS — I10 Essential (primary) hypertension: Secondary | ICD-10-CM | POA: Diagnosis not present

## 2021-09-10 ENCOUNTER — Telehealth: Payer: Self-pay | Admitting: Cardiology

## 2021-09-10 DIAGNOSIS — I700 Atherosclerosis of aorta: Secondary | ICD-10-CM

## 2021-09-10 NOTE — Telephone Encounter (Signed)
   Pre-operative Risk Assessment    Patient Name: Miranda Garcia  DOB: Aug 30, 1930 MRN: [id number]      Request for Surgical Clearance    Procedure:   LEFT CARPAL TUNNEL RELEASE  Date of Surgery: TBD  For convenience, highlight and copy (CTL+C) the Clearance MM/DD/YY phrase above. Click here to go to Reason for Visit.  Paste (CTL+V) the date.  Merchandiser, retail.  Then click button underneath called Add Clearance MM/DD/YY as free text.     :1}    Surgeon:  Roseanne Kaufman MD Surgeon's Group or Practice Name:  [facility] Phone number:  [phone] Fax number:  [phone] ATTENTION:   Gwenyth Bouillon.   Type of Clearance Requested:   - Pharmacy:  Hold Apixaban (Eliquis) 2 DAYS PREOP & 2 DAYS POST OP    Type of Anesthesia:  Local    Additional requests/questions:   Rulon Eisenmenger   09/10/2021, 3:16 PM

## 2021-09-11 DIAGNOSIS — I700 Atherosclerosis of aorta: Secondary | ICD-10-CM | POA: Insufficient documentation

## 2021-09-16 DIAGNOSIS — H04123 Dry eye syndrome of bilateral lacrimal glands: Secondary | ICD-10-CM | POA: Diagnosis not present

## 2021-09-16 DIAGNOSIS — H04523 Eversion of bilateral lacrimal punctum: Secondary | ICD-10-CM | POA: Diagnosis not present

## 2021-09-16 DIAGNOSIS — H02132 Senile ectropion of right lower eyelid: Secondary | ICD-10-CM | POA: Diagnosis not present

## 2021-09-16 DIAGNOSIS — H16211 Exposure keratoconjunctivitis, right eye: Secondary | ICD-10-CM | POA: Diagnosis not present

## 2021-09-16 DIAGNOSIS — H02112 Cicatricial ectropion of right lower eyelid: Secondary | ICD-10-CM | POA: Diagnosis not present

## 2021-09-16 DIAGNOSIS — H02532 Eyelid retraction right lower eyelid: Secondary | ICD-10-CM | POA: Diagnosis not present

## 2021-09-16 DIAGNOSIS — H04522 Eversion of left lacrimal punctum: Secondary | ICD-10-CM | POA: Diagnosis not present

## 2021-09-16 DIAGNOSIS — H16213 Exposure keratoconjunctivitis, bilateral: Secondary | ICD-10-CM | POA: Diagnosis not present

## 2021-09-16 DIAGNOSIS — H16212 Exposure keratoconjunctivitis, left eye: Secondary | ICD-10-CM | POA: Diagnosis not present

## 2021-09-16 DIAGNOSIS — H02115 Cicatricial ectropion of left lower eyelid: Secondary | ICD-10-CM | POA: Diagnosis not present

## 2021-09-16 DIAGNOSIS — H04521 Eversion of right lacrimal punctum: Secondary | ICD-10-CM | POA: Diagnosis not present

## 2021-09-16 DIAGNOSIS — H02135 Senile ectropion of left lower eyelid: Secondary | ICD-10-CM | POA: Diagnosis not present

## 2021-09-18 DIAGNOSIS — E1122 Type 2 diabetes mellitus with diabetic chronic kidney disease: Secondary | ICD-10-CM | POA: Diagnosis not present

## 2021-09-18 DIAGNOSIS — E7800 Pure hypercholesterolemia, unspecified: Secondary | ICD-10-CM | POA: Diagnosis not present

## 2021-09-18 DIAGNOSIS — I4891 Unspecified atrial fibrillation: Secondary | ICD-10-CM | POA: Diagnosis not present

## 2021-09-18 DIAGNOSIS — I10 Essential (primary) hypertension: Secondary | ICD-10-CM | POA: Diagnosis not present

## 2021-09-21 ENCOUNTER — Ambulatory Visit (INDEPENDENT_AMBULATORY_CARE_PROVIDER_SITE_OTHER): Payer: Medicare HMO

## 2021-09-21 DIAGNOSIS — I350 Nonrheumatic aortic (valve) stenosis: Secondary | ICD-10-CM | POA: Diagnosis not present

## 2021-09-21 DIAGNOSIS — I6522 Occlusion and stenosis of left carotid artery: Secondary | ICD-10-CM

## 2021-09-21 DIAGNOSIS — I6529 Occlusion and stenosis of unspecified carotid artery: Secondary | ICD-10-CM

## 2021-09-21 LAB — ECHOCARDIOGRAM COMPLETE
AR max vel: 1.36 cm2
AV Area VTI: 1.26 cm2
AV Area mean vel: 1.27 cm2
AV Mean grad: 11.8 mmHg
AV Peak grad: 20 mmHg
Ao pk vel: 2.23 m/s
Area-P 1/2: 3.02 cm2
MV M vel: 4.78 m/s
MV Peak grad: 91.2 mmHg
S' Lateral: 2.01 cm
Single Plane A4C EF: 67.5 %

## 2021-09-28 DIAGNOSIS — M545 Low back pain, unspecified: Secondary | ICD-10-CM | POA: Diagnosis not present

## 2021-09-28 DIAGNOSIS — M5136 Other intervertebral disc degeneration, lumbar region: Secondary | ICD-10-CM | POA: Diagnosis not present

## 2021-09-28 DIAGNOSIS — Z96642 Presence of left artificial hip joint: Secondary | ICD-10-CM | POA: Diagnosis not present

## 2021-09-28 DIAGNOSIS — M48062 Spinal stenosis, lumbar region with neurogenic claudication: Secondary | ICD-10-CM | POA: Diagnosis not present

## 2021-09-29 ENCOUNTER — Telehealth: Payer: Self-pay | Admitting: *Deleted

## 2021-09-29 NOTE — Telephone Encounter (Signed)
-----   Message from Arnoldo Lenis, MD sent at 09/27/2021 12:22 PM EDT ----- Just mild blockages in the arteries of the neck, continue to monitor at this time   Zandra Abts MD

## 2021-09-29 NOTE — Telephone Encounter (Signed)
Laurine Blazer, LPN  9/76/7341  9:37 PM EDT Back to Top    Notified daughter Katharine Look), copy to pcp.

## 2021-09-30 ENCOUNTER — Telehealth: Payer: Self-pay | Admitting: Cardiology

## 2021-09-30 NOTE — Telephone Encounter (Signed)
   Pre-operative Risk Assessment    Patient Name: Miranda Garcia  DOB: 1931/03/11 MRN: [id number]      Request for Surgical Clearance    Procedure:   Bilateral lower lip ectropion repair bilateral conjplasty right lower lip excision of lession   Date of Surgery:  Clearance 10/07/21                                 Surgeon:  Dr. Sarajane Marek Surgeon's Group or Practice Name:  [facility] Phone number:  [phone] Fax number:  [phone]   Type of Clearance Requested:   - Medical   And pharmacy if on blood thinners   Type of Anesthesia:  MAC   Additional requests/questions:    Sandrea Hammond   09/30/2021, 1:17 PM

## 2021-09-30 NOTE — Telephone Encounter (Signed)
This is a duplicate encounter. See encounter dated 09/10/21.

## 2021-09-30 NOTE — Telephone Encounter (Signed)
Ok to proceed with surgery, no significant new findings on echo  Zandra Abts MD

## 2021-09-30 NOTE — Telephone Encounter (Signed)
   Primary Cardiologist: Carlyle Dolly, MD  Chart reviewed as part of pre-operative protocol coverage. Given past medical history and time since last visit, based on ACC/AHA guidelines, Miranda Garcia would be at acceptable risk for the planned procedure without further cardiovascular testing.   Ok to hold Eliquis for 2 days prior to procedure given her CKD. Recommend resuming within 1 day if possible given low bleed risk procedure and pt's elevated CV risk.  I will route this recommendation to the requesting party via Epic fax function and remove from pre-op pool.  Please call with questions.  Emmaline Life, NP-C    09/30/2021, 1:42 PM [place] [facility] [address] Office [phone] Fax [phone]

## 2021-10-01 ENCOUNTER — Telehealth: Payer: Self-pay | Admitting: Cardiology

## 2021-10-01 NOTE — Telephone Encounter (Signed)
Patient's daughter called stating her mother is going to have eye surgery 7/19, she wants to know when she can stop taking her Eliquis.

## 2021-10-02 ENCOUNTER — Telehealth: Payer: Self-pay | Admitting: *Deleted

## 2021-10-02 NOTE — Telephone Encounter (Signed)
Patient with diagnosis of afib on Eliquis for anticoagulation.    Procedure: Bilateral lower lip ectropion repair bilateral conjplasty right lower lip excision of lesion  Date of procedure: 10/07/21  CHA2DS2-VASc Score = 7  This indicates a 11.2% annual risk of stroke. The patient's score is based upon: CHF History: 1 HTN History: 1 Diabetes History: 1 Stroke History: 0 Vascular Disease History: 1 Age Score: 2 Gender Score: 1   CrCl 64m/min using adjusted body weight Platelet count 155K  Per office protocol, patient can hold Eliquis for 1-2 days prior to procedure. She should resume as soon as safely possible after given her elevated CV risk.  **This guidance is not considered finalized until pre-operative APP has relayed final recommendations.**

## 2021-10-02 NOTE — Telephone Encounter (Signed)
Pt has been added on to App schedule for a tele visit, Monday, 10/05/21, as procedure is 10/07/21.

## 2021-10-02 NOTE — Telephone Encounter (Signed)
Pt has been scheduled for a tele visit, 10/05/21 11:15/ consent on file / medications reconciled.    Patient Consent for Virtual Visit        Miranda Garcia has provided verbal consent on 10/02/2021 for a virtual visit (video or telephone).   CONSENT FOR VIRTUAL VISIT FOR:  Miranda Garcia  By participating in this virtual visit I agree to the following:  I hereby voluntarily request, consent and authorize [facility] and its employed or contracted physicians, physician assistants, nurse practitioners or other licensed health care professionals (the Practitioner), to provide me with telemedicine health care services (the "Services") as deemed necessary by the treating Practitioner. I acknowledge and consent to receive the Services by the Practitioner via telemedicine. I understand that the telemedicine visit will involve communicating with the Practitioner through live audiovisual communication technology and the disclosure of certain medical information by electronic transmission. I acknowledge that I have been given the opportunity to request an in-person assessment or other available alternative prior to the telemedicine visit and am voluntarily participating in the telemedicine visit.  I understand that I have the right to withhold or withdraw my consent to the use of telemedicine in the course of my care at any time, without affecting my right to future care or treatment, and that the Practitioner or I may terminate the telemedicine visit at any time. I understand that I have the right to inspect all information obtained and/or recorded in the course of the telemedicine visit and may receive copies of available information for a reasonable fee.  I understand that some of the potential risks of receiving the Services via telemedicine include:  Delay or interruption in medical evaluation due to technological equipment failure or disruption; Information transmitted may not be sufficient (e.g.  poor resolution of images) to allow for appropriate medical decision making by the Practitioner; and/or  In rare instances, security protocols could fail, causing a breach of personal health information.  Furthermore, I acknowledge that it is my responsibility to provide information about my medical history, conditions and care that is complete and accurate to the best of my ability. I acknowledge that Practitioner's advice, recommendations, and/or decision may be based on factors not within their control, such as incomplete or inaccurate data provided by me or distortions of diagnostic images or specimens that may result from electronic transmissions. I understand that the practice of medicine is not an exact science and that Practitioner makes no warranties or guarantees regarding treatment outcomes. I acknowledge that a copy of this consent can be made available to me via my patient portal ([facility]), or I can request a printed copy by calling the office of [facility].    I understand that my insurance will be billed for this visit.   I have read or had this consent read to me. I understand the contents of this consent, which adequately explains the benefits and risks of the Services being provided via telemedicine.  I have been provided ample opportunity to ask questions regarding this consent and the Services and have had my questions answered to my satisfaction. I give my informed consent for the services to be provided through the use of telemedicine in my medical care

## 2021-10-05 ENCOUNTER — Ambulatory Visit (INDEPENDENT_AMBULATORY_CARE_PROVIDER_SITE_OTHER): Payer: Medicare HMO | Admitting: Physician Assistant

## 2021-10-05 DIAGNOSIS — Z01810 Encounter for preprocedural cardiovascular examination: Secondary | ICD-10-CM | POA: Diagnosis not present

## 2021-10-05 NOTE — Addendum Note (Signed)
Addended by: Dominga Ferry on: 10/05/2021 03:26 PM   Modules accepted: Level of Service

## 2021-10-05 NOTE — Progress Notes (Addendum)
Virtual Visit via Telephone Note   Because of Miranda Garcia's co-morbid illnesses, she is at least at moderate risk for complications without adequate follow up.  This format is felt to be most appropriate for this patient at this time.  The patient did not have access to video technology/had technical difficulties with video requiring transitioning to audio format only (telephone).  All issues noted in this document were discussed and addressed.  No physical exam could be performed with this format.  Please refer to the patient's chart for her consent to telehealth for [facility]  Evaluation Performed:  Preoperative cardiovascular risk assessment _____________   Date:  10/05/2021   Patient ID:  Miranda Garcia, DOB 01-22-1931, MRN [id number] Patient Location:  Home Provider location:   Office  Primary Care Provider:  Glenda Chroman, MD Primary Cardiologist:  Carlyle Dolly, MD  Chief Complaint / Patient Profile   88 y.o. y/o female with a h/o chronic diastolic CHF, CKD IV, HTN, afib, HLD and aortic stenosis  who is pending Bilateral lower lip ectropion repair bilateral conjplasty right lower lip excision of lession  and presents today for telephonic preoperative cardiovascular risk assessment.  Past Medical History    Past Medical History:  Diagnosis Date   Arthritis    back, knees   Atrial fibrillation (HCC)    CHF (congestive heart failure) ([place]) 08/2018   Diabetes mellitus type II, controlled ([place])    x 15 yrs   Dysrhythmia    A-fib   Elevated troponin    Hemorrhoids    History of kidney stones    Hypertension    Neuromuscular disorder (HCC)    numbness in hands    Renal insufficiency    stage iv   Past Surgical History:  Procedure Laterality Date   BACK SURGERY  1997   [facility]   CATARACT EXTRACTION W/PHACO  01/11/2011   Procedure: CATARACT EXTRACTION PHACO AND INTRAOCULAR LENS PLACEMENT ([place]);  Surgeon: Tonny Branch;  Location: [facility] ORS;  Service:  Ophthalmology;  Laterality: Right;  CDE 19.32   CATARACT EXTRACTION W/PHACO  01/21/2011   Procedure: CATARACT EXTRACTION PHACO AND INTRAOCULAR LENS PLACEMENT (IOC);  Surgeon: Tonny Branch;  Location: [facility] ORS;  Service: Ophthalmology;  Laterality: Left;  CDE: 21.19   COLONOSCOPY N/A 09/12/2013   Procedure: COLONOSCOPY;  Surgeon: Rogene Houston, MD;  Location: [facility] ENDO SUITE;  Service: Endoscopy;  Laterality: N/A;  [place]   LITHOTRIPSY     APH   PARTIAL HYSTERECTOMY     TOTAL HIP ARTHROPLASTY Left 09/11/2019   Procedure: TOTAL HIP ARTHROPLASTY ANTERIOR APPROACH;  Surgeon: Paralee Cancel, MD;  Location: [facility] ORS;  Service: Orthopedics;  Laterality: Left;  70 mins    Allergies  Allergies  Allergen Reactions   Atorvastatin Other (See Comments)    Leg pains   Contrast Media [Iodinated Contrast Media]     Pt has 1 full functioning kidney   Penicillins Nausea And Vomiting and Rash    Tolerated Cephalosporin Date: 09/11/19.    Did it involve swelling of the face/tongue/throat, SOB, or low BP? No Did it involve sudden or severe rash/hives, skin peeling, or any reaction on the inside of your mouth or nose? Yes Did you need to seek medical attention at a hospital or doctor's office? Yes When did it last happen?      65 yrs ago If all above answers are "NO", may proceed with cephalosporin use.  History of Present Illness    Miranda Garcia is a 88 y.o. female who presents via audio/video conferencing for a telehealth visit today.  Pt was last seen in cardiology clinic on 09/03/21 by Dr. Harl Bowie.  At that time Miranda Garcia was doing well .  The patient is now pending procedure as outlined above. Since her last visit, she well.  She underwent echocardiogram showing moderate stable aortic stenosis.  She denies chest pain, shortness of breath, orthopnea, PND, syncope, lower extremity edema or melena.  She watches her salt and fluid intake.  Initially unable to reach the  patient in morning but called back,   Home Medications    Prior to Admission medications   Medication Sig Start Date End Date Taking? Authorizing Provider  apixaban (ELIQUIS) 2.5 MG TABS tablet TAKE ONE TABLET BY MOUTH TWICE DAILY 04/13/21   Arnoldo Lenis, MD  Calcium Carbonate-Vit D-Min (CALTRATE 600+D PLUS MINERALS) 600-800 MG-UNIT TABS Take 1 tablet by mouth daily.    [provider]  carvedilol (COREG) 12.5 MG tablet Take 12.5 mg by mouth 2 (two) times daily. 06/23/20   [provider]  Cholecalciferol (VITAMIN D3) 10 MCG (400 UNIT) tablet Take 400 Units by mouth daily.    [provider]  diltiazem (CARDIZEM CD) 240 MG 24 hr capsule Take 240 mg by mouth daily. 07/16/20   [provider]  erythromycin ophthalmic ointment erythromycin 5 mg/gram (0.5 %) eye ointment    [provider]  furosemide (LASIX) 80 MG tablet Take 80 mg by mouth daily.    [provider]  insulin aspart protamine- aspart (NOVOLOG MIX 70/30) (70-30) 100 UNIT/ML injection Inject 0.12 mLs (12 Units total) into the skin 2 (two) times a day. Patient taking differently: Inject 12-15 Units into the skin 2 (two) times a day. 08/31/18   Guilford Shi, MD  Multiple Vitamins-Minerals (PRESERVISION AREDS 2 PO) Take 1 tablet by mouth in the morning and at bedtime.    [provider]  pravastatin (PRAVACHOL) 20 MG tablet TAKE 1 TABLET DAILY IN THE EVENING 08/06/21   Arnoldo Lenis, MD    Physical Exam    Vital Signs:  Miranda Garcia does not have vital signs available for review today.  Given telephonic nature of communication, physical exam is limited. AAOx3. NAD. Normal affect.  Speech and respirations are unlabored.  Accessory Clinical Findings    None  Assessment & Plan    1.  Preoperative Cardiovascular Risk Assessment:  Given past medical history and time since last visit, based on ACC/AHA guidelines, Miranda Garcia would be at acceptable  risk for the planned procedure without further cardiovascular testing.   Per office protocol, patient can hold Eliquis for 1-2 days prior to procedure. She should resume as soon as safely possible after given her elevated CV risk.  The patient was advised that if she develops new symptoms prior to surgery to contact our office to arrange for a follow-up visit, and she verbalized understanding.  I will route this recommendation to the requesting party via Epic fax function and remove from pre-op pool.  Please call with questions.  Time:   Today, I have spent 11 minutes with the patient with telehealth technology discussing medical history, symptoms, and management plan.     [place], [place]  10/05/2021, 3:16 PM

## 2021-10-07 DIAGNOSIS — H04223 Epiphora due to insufficient drainage, bilateral lacrimal glands: Secondary | ICD-10-CM | POA: Diagnosis not present

## 2021-10-07 DIAGNOSIS — H02532 Eyelid retraction right lower eyelid: Secondary | ICD-10-CM | POA: Diagnosis not present

## 2021-10-07 DIAGNOSIS — D23112 Other benign neoplasm of skin of right lower eyelid, including canthus: Secondary | ICD-10-CM | POA: Diagnosis not present

## 2021-10-07 DIAGNOSIS — H02135 Senile ectropion of left lower eyelid: Secondary | ICD-10-CM | POA: Diagnosis not present

## 2021-10-07 DIAGNOSIS — H16212 Exposure keratoconjunctivitis, left eye: Secondary | ICD-10-CM | POA: Diagnosis not present

## 2021-10-07 DIAGNOSIS — D485 Neoplasm of uncertain behavior of skin: Secondary | ICD-10-CM | POA: Diagnosis not present

## 2021-10-07 DIAGNOSIS — H16211 Exposure keratoconjunctivitis, right eye: Secondary | ICD-10-CM | POA: Diagnosis not present

## 2021-10-07 DIAGNOSIS — H02115 Cicatricial ectropion of left lower eyelid: Secondary | ICD-10-CM | POA: Diagnosis not present

## 2021-10-07 DIAGNOSIS — H04523 Eversion of bilateral lacrimal punctum: Secondary | ICD-10-CM | POA: Diagnosis not present

## 2021-10-07 DIAGNOSIS — H02112 Cicatricial ectropion of right lower eyelid: Secondary | ICD-10-CM | POA: Diagnosis not present

## 2021-10-07 DIAGNOSIS — H16213 Exposure keratoconjunctivitis, bilateral: Secondary | ICD-10-CM | POA: Diagnosis not present

## 2021-10-07 DIAGNOSIS — H02132 Senile ectropion of right lower eyelid: Secondary | ICD-10-CM | POA: Diagnosis not present

## 2021-10-07 DIAGNOSIS — H02535 Eyelid retraction left lower eyelid: Secondary | ICD-10-CM | POA: Diagnosis not present

## 2021-10-09 ENCOUNTER — Other Ambulatory Visit: Payer: Self-pay | Admitting: Cardiology

## 2021-10-09 DIAGNOSIS — I482 Chronic atrial fibrillation, unspecified: Secondary | ICD-10-CM

## 2021-10-09 NOTE — Telephone Encounter (Signed)
Eliquis 2.'5mg'$  refill request received. Patient is 88 years old, weight-80.1kg, Crea-2.71 on 07/14/2021 via scanned labs from [place], [place], and last seen by [place], [place] on 10/05/2021. Dose is appropriate based on dosing criteria. Will send in refill to requested pharmacy.

## 2021-10-29 DIAGNOSIS — R42 Dizziness and giddiness: Secondary | ICD-10-CM | POA: Diagnosis not present

## 2021-10-29 DIAGNOSIS — Z79899 Other long term (current) drug therapy: Secondary | ICD-10-CM | POA: Diagnosis not present

## 2021-10-29 DIAGNOSIS — Z0000 Encounter for general adult medical examination without abnormal findings: Secondary | ICD-10-CM | POA: Diagnosis not present

## 2021-10-29 DIAGNOSIS — Z1339 Encounter for screening examination for other mental health and behavioral disorders: Secondary | ICD-10-CM | POA: Diagnosis not present

## 2021-10-29 DIAGNOSIS — Z6832 Body mass index (BMI) 32.0-32.9, adult: Secondary | ICD-10-CM | POA: Diagnosis not present

## 2021-10-29 DIAGNOSIS — Z1331 Encounter for screening for depression: Secondary | ICD-10-CM | POA: Diagnosis not present

## 2021-10-29 DIAGNOSIS — R5383 Other fatigue: Secondary | ICD-10-CM | POA: Diagnosis not present

## 2021-10-29 DIAGNOSIS — E7800 Pure hypercholesterolemia, unspecified: Secondary | ICD-10-CM | POA: Diagnosis not present

## 2021-10-29 DIAGNOSIS — I10 Essential (primary) hypertension: Secondary | ICD-10-CM | POA: Diagnosis not present

## 2021-10-29 DIAGNOSIS — Z7189 Other specified counseling: Secondary | ICD-10-CM | POA: Diagnosis not present

## 2021-10-29 DIAGNOSIS — Z299 Encounter for prophylactic measures, unspecified: Secondary | ICD-10-CM | POA: Diagnosis not present

## 2021-11-09 DIAGNOSIS — E7800 Pure hypercholesterolemia, unspecified: Secondary | ICD-10-CM | POA: Diagnosis not present

## 2021-11-09 DIAGNOSIS — D6869 Other thrombophilia: Secondary | ICD-10-CM | POA: Diagnosis not present

## 2021-11-09 DIAGNOSIS — I700 Atherosclerosis of aorta: Secondary | ICD-10-CM | POA: Diagnosis not present

## 2021-11-09 DIAGNOSIS — I4891 Unspecified atrial fibrillation: Secondary | ICD-10-CM | POA: Diagnosis not present

## 2021-11-09 DIAGNOSIS — M5416 Radiculopathy, lumbar region: Secondary | ICD-10-CM | POA: Diagnosis not present

## 2021-11-12 DIAGNOSIS — N184 Chronic kidney disease, stage 4 (severe): Secondary | ICD-10-CM | POA: Diagnosis not present

## 2021-11-12 DIAGNOSIS — D631 Anemia in chronic kidney disease: Secondary | ICD-10-CM | POA: Diagnosis not present

## 2021-11-12 DIAGNOSIS — I5032 Chronic diastolic (congestive) heart failure: Secondary | ICD-10-CM | POA: Diagnosis not present

## 2021-11-12 DIAGNOSIS — N2581 Secondary hyperparathyroidism of renal origin: Secondary | ICD-10-CM | POA: Diagnosis not present

## 2021-11-12 DIAGNOSIS — N189 Chronic kidney disease, unspecified: Secondary | ICD-10-CM | POA: Diagnosis not present

## 2021-11-12 DIAGNOSIS — I480 Paroxysmal atrial fibrillation: Secondary | ICD-10-CM | POA: Diagnosis not present

## 2021-11-12 DIAGNOSIS — N390 Urinary tract infection, site not specified: Secondary | ICD-10-CM | POA: Diagnosis not present

## 2021-11-12 DIAGNOSIS — I129 Hypertensive chronic kidney disease with stage 1 through stage 4 chronic kidney disease, or unspecified chronic kidney disease: Secondary | ICD-10-CM | POA: Diagnosis not present

## 2021-11-12 DIAGNOSIS — E1122 Type 2 diabetes mellitus with diabetic chronic kidney disease: Secondary | ICD-10-CM | POA: Diagnosis not present

## 2021-11-16 ENCOUNTER — Ambulatory Visit: Payer: Medicare HMO | Attending: Physical Medicine and Rehabilitation

## 2021-11-16 ENCOUNTER — Other Ambulatory Visit: Payer: Self-pay

## 2021-11-16 DIAGNOSIS — M5416 Radiculopathy, lumbar region: Secondary | ICD-10-CM | POA: Diagnosis not present

## 2021-11-16 DIAGNOSIS — M6281 Muscle weakness (generalized): Secondary | ICD-10-CM | POA: Insufficient documentation

## 2021-11-16 NOTE — Therapy (Signed)
OUTPATIENT PHYSICAL THERAPY THORACOLUMBAR EVALUATION   Patient Name: Miranda Garcia MRN: [id number] DOB:[birth year], 88 y.o., female Today's Date: 11/16/2021   PT End of Session - 11/16/21 1118     Visit Number 1    Number of Visits 8    Date for PT Re-Evaluation 12/18/21    PT Start Time 1121    PT Stop Time 1206    PT Time Calculation (min) 45 min    Activity Tolerance Patient tolerated treatment well    Behavior During Therapy [facility] for tasks assessed/performed             Past Medical History:  Diagnosis Date   Arthritis    back, knees   Atrial fibrillation (HCC)    CHF (congestive heart failure) ([place]) 08/2018   Diabetes mellitus type II, controlled ([place])    x 15 yrs   Dysrhythmia    A-fib   Elevated troponin    Hemorrhoids    History of kidney stones    Hypertension    Neuromuscular disorder (HCC)    numbness in hands    Renal insufficiency    stage iv   Past Surgical History:  Procedure Laterality Date   [place]   [facility]   CATARACT EXTRACTION W/PHACO  01/11/2011   Procedure: CATARACT EXTRACTION PHACO AND INTRAOCULAR LENS PLACEMENT ([place]);  Surgeon: Tonny Branch;  Location: [facility] ORS;  Service: Ophthalmology;  Laterality: Right;  CDE 19.32   CATARACT EXTRACTION W/PHACO  01/21/2011   Procedure: CATARACT EXTRACTION PHACO AND INTRAOCULAR LENS PLACEMENT (IOC);  Surgeon: Tonny Branch;  Location: [facility]S;  Service: Ophthalmology;  Laterality: Left;  CDE: 21.19   COLONOSCOPY N/A 09/12/2013   Procedure: COLONOSCOPY;  Surgeon: Rogene Houston, MD;  Location[facility];  Service: Endoscopy;  Laterality: N/A;  [place]   LITHOTRIPSY     APH   PARTIAL HYSTERECTOMY     TOTAL HIP ARTHROPLASTY Left 09/11/2019   Procedure: TOTAL HIP ARTHROPLASTY ANTERIOR APPROACH;  Surgeon: Paralee Cancel, MD;  Location: [facility];  Service: Orthopedics;  Laterality: Left;  70 mins   Patient Active Problem List   Diagnosis Date Noted   Aortic atherosclerosis  ([place]) 09/11/2021   Obese 09/12/2019   S/P left THA 09/11/2019   Acute on chronic diastolic CHF (congestive heart failure) ([place]) 08/25/2018   Insulin-requiring or dependent type II diabetes mellitus ([place]) 08/25/2018   Hemoptysis 06/02/2018   Dyspnea 04/17/2018   Pulmonary nodules 04/17/2018   Heme positive stool 08/23/2013   Long term (current) use of anticoagulants 03/03/2012   Chronic diastolic CHF (congestive heart failure) ([place]) 02/28/2012   Hypomagnesemia 02/26/2012   Elevated troponin 02/26/2012   Congestive heart failure ([place]) 02/26/2012   Atrial fibrillation ([place]) 02/25/2012   Chest pain 02/25/2012   CKD (chronic kidney disease), stage IV ([place]) 02/25/2012   Leukocytosis 02/25/2012   Anemia due to chronic illness 02/25/2012   Diabetes ([place]) 02/25/2012   REFERRING PROVIDER: Suella Broad, MD  REFERRING DIAG: Radiculopathy, lumbar region  Rationale for Evaluation and Treatment Rehabilitation  THERAPY DIAG:  Radiculopathy, lumbar region  Muscle weakness (generalized)  ONSET DATE: 5 weeks ago  SUBJECTIVE:  SUBJECTIVE STATEMENT: Patient reports that she has been having back problems for several years. She has needed multiple back surgeries over the years. She noticed that her back started getting worse after having her hip replacement in 2021. She was told that her hips look good, but her back is "a mess." She then began having right hip and leg pain about 5 weeks ago. She has noticed that her pain is getting worse since it first began. She had pain like this before when she ruptured some discs in her back.  PERTINENT HISTORY:  Multiple back surgeries, left hip replacement, a-fib, CHF, DM, and CKD  PAIN:  Are you having pain? Yes: NPRS scale: 10/10 Pain location: low back radiating into her  anterior thigh and mid shin  Pain description: sharp, soreness, aching, burning Aggravating factors: walking, putting her right foot on the floor Relieving factors: pain medication and biofreeze is the only thing that helps   PRECAUTIONS: None  WEIGHT BEARING RESTRICTIONS No  FALLS:  Has patient fallen in last 6 months? No  LIVING ENVIRONMENT: Lives with: lives with their family Lives in: House/apartment Stairs: Yes: External: 4-8 steps; can reach both Has following equipment at home: Environmental consultant - 2 wheeled  OCCUPATION: retired  PLOF: [place] reduced pain, return to church (has not been in 3 weeks due to pain), walk and stand longer (she was able to stand at least 10 minutes prior to her LE symptoms beginning 5 weeks ago), sleep better (3 hours currently with medication)   OBJECTIVE:   SCREENING FOR RED FLAGS: Bowel or bladder incontinence: No Spinal tumors: No Cauda equina syndrome: No Compression fracture: No Abdominal aneurysm: No  COGNITION:  Overall cognitive status: Within functional limits for tasks assessed     SENSATION: Patient reports no numbness or tingling  PALPATION: TTP: right lumbar paraspinals, gluteals, piriformis, IT band, quadriceps, hamstring, anterior tibialis, and gastroc/soleus  JOINT MOBILITY:  L3-5: reproduced sharp low back pain and hypomobile   LUMBAR ROM: unable to safely be assessed at this time  LOWER EXTREMITY ROM:  WFL for activities assessed  LOWER EXTREMITY MMT:    MMT Right eval Left eval  Hip flexion 3+/5 4-/5  Hip extension    Hip abduction    Hip adduction    Hip internal rotation    Hip external rotation    Knee flexion 3/5 4+/5  Knee extension 3/5 with familiar thigh pain 4+/5  Ankle dorsiflexion 3+/5 4-/5  Ankle plantarflexion    Ankle inversion    Ankle eversion     (Blank rows = not tested)  FUNCTIONAL TESTS:  Supine to sit transfer: modA  Sit to stand: required UE support    GAIT: Assistive device utilized: Environmental consultant - 2 wheeled Level of assistance: Modified independence Comments: decreased gait speed, stride length, absent heel strike and toe off bilaterally    TODAY'S TREATMENT                                    8/2/8 EXERCISE LOG  Exercise Repetitions and Resistance Comments  Lower trunk rotation 20 reps    Hip adduction isometric 5 second hold x 15 reps   Bridge  15 reps            Blank cell = exercise not performed today    PATIENT EDUCATION:  Education details: HEP, healing, prognosis, POC Person educated: Patient and Child(ren) Education method:  Explanation Education comprehension: verbalized understanding   HOME EXERCISE PROGRAM: Today's interventions were provided as her HEP. Patient and her daughter declined a handout.   ASSESSMENT:  CLINICAL IMPRESSION: Patient is a 88 y.o. female who was seen today for physical therapy evaluation and treatment for right lumbar radiculopathy. She exhibited decreased right lower extremity strength compared to the left lower extremity. She exhibited increased tenderness to palpation throughout her right low back and lower extremity. She was provided a HEP which she was able to properly demonstrate. She reported feeling comfortable with these interventions. She continues to require skilled physical therapy to address her remaining impairments to maximize her functional mobility.    OBJECTIVE IMPAIRMENTS Abnormal gait, decreased mobility, difficulty walking, decreased strength, hypomobility, impaired tone, and pain.   ACTIVITY LIMITATIONS carrying, lifting, bending, standing, squatting, sleeping, transfers, bed mobility, and locomotion level  PARTICIPATION LIMITATIONS: meal prep, cleaning, and community activity  PERSONAL FACTORS Age, Time since onset of injury/illness/exacerbation, Transportation, and 3+ comorbidities: a-fib, CHF, DM, and CKD  are also affecting patient's functional outcome.   REHAB  POTENTIAL: Fair    CLINICAL DECISION MAKING: Evolving/moderate complexity  EVALUATION COMPLEXITY: Moderate   GOALS: Goals reviewed with patient? No  LONG TERM GOALS: Target date: 12/14/2021  Patient will be independent with her HEP.  Baseline:  Goal status: INITIAL  2.  Patient will be able to stand for at least 10 minutes for improved function with household activities such as cooking.  Baseline:  Goal status: INITIAL  3.  Patient will be able to complete her daily activities without her familiar pain exceeding 6/10. Baseline:  Goal status: INITIAL  4.  Patient will be able to return to church without being limited by her familiar low back and right leg pain.  Baseline:  Goal status: INITIAL  PLAN: PT FREQUENCY: 2x/week  PT DURATION: 4 weeks  PLANNED INTERVENTIONS: Therapeutic exercises, Therapeutic activity, Neuromuscular re-education, Balance training, Gait training, Patient/Family education, Self Care, Joint mobilization, Stair training, Electrical stimulation, Spinal mobilization, Cryotherapy, Moist heat, Taping, Manual therapy, and Re-evaluation.  PLAN FOR NEXT SESSION: nustep, review HEP, clams, hip abduction, and modalities as needed   Darlin Coco, PT 11/16/2021, 12:41 PM

## 2021-11-17 DIAGNOSIS — E669 Obesity, unspecified: Secondary | ICD-10-CM | POA: Diagnosis not present

## 2021-11-17 DIAGNOSIS — I2510 Atherosclerotic heart disease of native coronary artery without angina pectoris: Secondary | ICD-10-CM | POA: Diagnosis not present

## 2021-11-17 DIAGNOSIS — E261 Secondary hyperaldosteronism: Secondary | ICD-10-CM | POA: Diagnosis not present

## 2021-11-17 DIAGNOSIS — N184 Chronic kidney disease, stage 4 (severe): Secondary | ICD-10-CM | POA: Diagnosis not present

## 2021-11-17 DIAGNOSIS — I4891 Unspecified atrial fibrillation: Secondary | ICD-10-CM | POA: Diagnosis not present

## 2021-11-17 DIAGNOSIS — I130 Hypertensive heart and chronic kidney disease with heart failure and stage 1 through stage 4 chronic kidney disease, or unspecified chronic kidney disease: Secondary | ICD-10-CM | POA: Diagnosis not present

## 2021-11-17 DIAGNOSIS — Z794 Long term (current) use of insulin: Secondary | ICD-10-CM | POA: Diagnosis not present

## 2021-11-17 DIAGNOSIS — I509 Heart failure, unspecified: Secondary | ICD-10-CM | POA: Diagnosis not present

## 2021-11-17 DIAGNOSIS — E785 Hyperlipidemia, unspecified: Secondary | ICD-10-CM | POA: Diagnosis not present

## 2021-11-17 DIAGNOSIS — E1122 Type 2 diabetes mellitus with diabetic chronic kidney disease: Secondary | ICD-10-CM | POA: Diagnosis not present

## 2021-11-17 DIAGNOSIS — D6869 Other thrombophilia: Secondary | ICD-10-CM | POA: Diagnosis not present

## 2021-11-17 DIAGNOSIS — K5900 Constipation, unspecified: Secondary | ICD-10-CM | POA: Diagnosis not present

## 2021-11-18 ENCOUNTER — Ambulatory Visit: Payer: Medicare HMO

## 2021-11-18 DIAGNOSIS — M5416 Radiculopathy, lumbar region: Secondary | ICD-10-CM

## 2021-11-18 DIAGNOSIS — M6281 Muscle weakness (generalized): Secondary | ICD-10-CM

## 2021-11-18 NOTE — Therapy (Signed)
OUTPATIENT PHYSICAL THERAPY THORACOLUMBAR TREATMENT   Patient Name: Miranda Garcia MRN: [id number] DOB:1931/01/25, 88 y.o., female Today's Date: 11/18/2021   PT End of Session - 11/18/21 1345     Visit Number 2    Number of Visits 8    Date for PT Re-Evaluation 12/18/21    PT Start Time 1346    PT Stop Time 1430    PT Time Calculation (min) 44 min    Activity Tolerance Patient tolerated treatment well    Behavior During Therapy [facility] for tasks assessed/performed             Past Medical History:  Diagnosis Date   Arthritis    back, knees   Atrial fibrillation (HCC)    CHF (congestive heart failure) ([place]) 08/2018   Diabetes mellitus type II, controlled ([place])    x 15 yrs   Dysrhythmia    A-fib   Elevated troponin    Hemorrhoids    History of kidney stones    Hypertension    Neuromuscular disorder (HCC)    numbness in hands    Renal insufficiency    stage iv   Past Surgical History:  Procedure Laterality Date   [place]   [facility]   CATARACT EXTRACTION W/PHACO  01/11/2011   Procedure: CATARACT EXTRACTION PHACO AND INTRAOCULAR LENS PLACEMENT ([place]);  Surgeon: Tonny Branch;  Location: [facility] ORS;  Service: Ophthalmology;  Laterality: Right;  CDE 19.32   CATARACT EXTRACTION W/PHACO  01/21/2011   Procedure: CATARACT EXTRACTION PHACO AND INTRAOCULAR LENS PLACEMENT (IOC);  Surgeon: Tonny Branch;  Location: [facility] ORS;  Service: Ophthalmology;  Laterality: Left;  CDE: 21.19   COLONOSCOPY N/A 09/12/2013   Procedure: COLONOSCOPY;  Surgeon: Rogene Houston, MD;  Location: [facility] ENDO SUITE;  Service: Endoscopy;  Laterality: N/A;  [place]   LITHOTRIPSY     APH   PARTIAL HYSTERECTOMY     TOTAL HIP ARTHROPLASTY Left 09/11/2019   Procedure: TOTAL HIP ARTHROPLASTY ANTERIOR APPROACH;  Surgeon: Paralee Cancel, MD;  Location: [facility] ORS;  Service: Orthopedics;  Laterality: Left;  70 mins   Patient Active Problem List   Diagnosis Date Noted   Aortic atherosclerosis  ([place]) 09/11/2021   Obese 09/12/2019   S/P left THA 09/11/2019   Acute on chronic diastolic CHF (congestive heart failure) ([place]) 08/25/2018   Insulin-requiring or dependent type II diabetes mellitus ([place]) 08/25/2018   Hemoptysis 06/02/2018   Dyspnea 04/17/2018   Pulmonary nodules 04/17/2018   Heme positive stool 08/23/2013   Long term (current) use of anticoagulants 03/03/2012   Chronic diastolic CHF (congestive heart failure) ([place]) 02/28/2012   Hypomagnesemia 02/26/2012   Elevated troponin 02/26/2012   Congestive heart failure ([place]) 02/26/2012   Atrial fibrillation ([place]) 02/25/2012   Chest pain 02/25/2012   CKD (chronic kidney disease), stage IV ([place]) 02/25/2012   Leukocytosis 02/25/2012   Anemia due to chronic illness 02/25/2012   Diabetes ([place]) 02/25/2012   REFERRING PROVIDER: Suella Broad, MD  REFERRING DIAG: Radiculopathy, lumbar region  Rationale for Evaluation and Treatment Rehabilitation  THERAPY DIAG:  Radiculopathy, lumbar region  Muscle weakness (generalized)  ONSET DATE: 5 weeks ago  SUBJECTIVE:  SUBJECTIVE STATEMENT: Patient reports that her HEP is helping some. She notes that she is still sore down into her legs, but it is not as bad. However, it still hurts a lot when she is standing and walking.  PERTINENT HISTORY:  Multiple back surgeries, left hip replacement, a-fib, CHF, DM, and CKD  PAIN:  Are you having pain? Yes: NPRS scale: 10/10 Pain location: low back radiating into her right anterior thigh and mid shin  Pain description: sharp, soreness, aching, burning Aggravating factors: walking, putting her right foot on the floor Relieving factors: pain medication and biofreeze is the only thing that helps   PRECAUTIONS: None  WEIGHT BEARING RESTRICTIONS No  FALLS:   Has patient fallen in last 6 months? No  LIVING ENVIRONMENT: Lives with: lives with their family Lives in: House/apartment Stairs: Yes: External: 4-8 steps; can reach both Has following equipment at home: Environmental consultant - 2 wheeled  OCCUPATION: retired  PLOF: [place] reduced pain, return to church (has not been in 3 weeks due to pain), walk and stand longer (she was able to stand at least 10 minutes prior to her LE symptoms beginning 5 weeks ago), sleep better (3 hours currently with medication)   OBJECTIVE:   SCREENING FOR RED FLAGS: Bowel or bladder incontinence: No Spinal tumors: No Cauda equina syndrome: No Compression fracture: No Abdominal aneurysm: No  COGNITION:  Overall cognitive status: Within functional limits for tasks assessed     SENSATION: Patient reports no numbness or tingling  PALPATION: TTP: right lumbar paraspinals, gluteals, piriformis, IT band, quadriceps, hamstring, anterior tibialis, and gastroc/soleus  JOINT MOBILITY:  L3-5: reproduced sharp low back pain and hypomobile   LUMBAR ROM: unable to safely be assessed at this time  LOWER EXTREMITY ROM:  WFL for activities assessed  LOWER EXTREMITY MMT:    MMT Right eval Left eval  Hip flexion 3+/5 4-/5  Hip extension    Hip abduction    Hip adduction    Hip internal rotation    Hip external rotation    Knee flexion 3/5 4+/5  Knee extension 3/5 with familiar thigh pain 4+/5  Ankle dorsiflexion 3+/5 4-/5  Ankle plantarflexion    Ankle inversion    Ankle eversion     (Blank rows = not tested)  FUNCTIONAL TESTS:  Supine to sit transfer: modA  Sit to stand: required UE support   GAIT: Assistive device utilized: Environmental consultant - 2 wheeled Level of assistance: Modified independence Comments: decreased gait speed, stride length, absent heel strike and toe off bilaterally    TODAY'S TREATMENT                                    8/30 EXERCISE LOG  Exercise Repetitions and  Resistance Comments  Nustep  L4 x 15 minutes   LAQ 4# x 2 minutes   Slouch/ overcorrect  20 reps   Standing march  20 reps        Blank cell = exercise not performed today  Manual Therapy Soft Tissue Mobilization: right quadriceps, IT band, and hip adductors, for reduced pain                                     8/2/8 EXERCISE LOG  Exercise Repetitions and Resistance Comments  Lower trunk rotation 20 reps    Hip  adduction isometric 5 second hold x 15 reps   Bridge  15 reps            Blank cell = exercise not performed today    PATIENT EDUCATION:  Education details: HEP, healing, prognosis, POC Person educated: Patient and Child(ren) Education method: Explanation Education comprehension: verbalized understanding   HOME EXERCISE PROGRAM: Today's interventions were provided as her HEP. Patient and her daughter declined a handout.   ASSESSMENT:  CLINICAL IMPRESSION: Patient was introduced to multiple new interventions for improved lumbar mobility and lower extremity strength. She required minimal cueing with slouch/ overcorrect for proper performance to facilitate lumbar mobility. She experienced no pain or discomfort with any of today's interventions. Soft tissue mobilization to the right quadriceps was the most effective at reducing her familiar right leg pain. She reported feeling better while walking at the conclusion of treatment. She continues to require skilled physical therapy to address her remaining impairments to return to her prior level of function.    OBJECTIVE IMPAIRMENTS Abnormal gait, decreased mobility, difficulty walking, decreased strength, hypomobility, impaired tone, and pain.   ACTIVITY LIMITATIONS carrying, lifting, bending, standing, squatting, sleeping, transfers, bed mobility, and locomotion level  PARTICIPATION LIMITATIONS: meal prep, cleaning, and community activity  PERSONAL FACTORS Age, Time since onset of injury/illness/exacerbation,  Transportation, and 3+ comorbidities: a-fib, CHF, DM, and CKD  are also affecting patient's functional outcome.   REHAB POTENTIAL: Fair    CLINICAL DECISION MAKING: Evolving/moderate complexity  EVALUATION COMPLEXITY: Moderate   GOALS: Goals reviewed with patient? No  LONG TERM GOALS: Target date: 12/14/2021  Patient will be independent with her HEP.  Baseline:  Goal status: INITIAL  2.  Patient will be able to stand for at least 10 minutes for improved function with household activities such as cooking.  Baseline:  Goal status: INITIAL  3.  Patient will be able to complete her daily activities without her familiar pain exceeding 6/10. Baseline:  Goal status: INITIAL  4.  Patient will be able to return to church without being limited by her familiar low back and right leg pain.  Baseline:  Goal status: INITIAL  PLAN: PT FREQUENCY: 2x/week  PT DURATION: 4 weeks  PLANNED INTERVENTIONS: Therapeutic exercises, Therapeutic activity, Neuromuscular re-education, Balance training, Gait training, Patient/Family education, Self Care, Joint mobilization, Stair training, Electrical stimulation, Spinal mobilization, Cryotherapy, Moist heat, Taping, Manual therapy, and Re-evaluation.  PLAN FOR NEXT SESSION: nustep, review HEP, clams, hip abduction, and modalities as needed   Darlin Coco, PT 11/18/2021, 2:52 PM

## 2021-11-25 ENCOUNTER — Ambulatory Visit: Payer: Medicare HMO | Attending: Physical Medicine and Rehabilitation

## 2021-11-25 DIAGNOSIS — M5416 Radiculopathy, lumbar region: Secondary | ICD-10-CM | POA: Insufficient documentation

## 2021-11-25 DIAGNOSIS — M6281 Muscle weakness (generalized): Secondary | ICD-10-CM | POA: Diagnosis not present

## 2021-11-25 NOTE — Therapy (Addendum)
OUTPATIENT PHYSICAL THERAPY THORACOLUMBAR TREATMENT   Patient Name: Miranda Garcia MRN: [id number] DOB:May 27, 1930, 88 y.o., female Today's Date: 11/25/2021   PT End of Session - 11/25/21 1307     Visit Number 3    Number of Visits 8    Date for PT Re-Evaluation 12/18/21    PT Start Time 1300    PT Stop Time 1601    PT Time Calculation (min) 55 min    Activity Tolerance Patient tolerated treatment well    Behavior During Therapy [facility] for tasks assessed/performed              Past Medical History:  Diagnosis Date   Arthritis    back, knees   Atrial fibrillation (HCC)    CHF (congestive heart failure) ([place]) 08/2018   Diabetes mellitus type II, controlled ([place])    x 15 yrs   Dysrhythmia    A-fib   Elevated troponin    Hemorrhoids    History of kidney stones    Hypertension    Neuromuscular disorder (HCC)    numbness in hands    Renal insufficiency    stage iv   Past Surgical History:  Procedure Laterality Date   [place]   [facility]   CATARACT EXTRACTION W/PHACO  01/11/2011   Procedure: CATARACT EXTRACTION PHACO AND INTRAOCULAR LENS PLACEMENT ([place]);  Surgeon: Tonny Branch;  Location: [facility];  Service: Ophthalmology;  Laterality: Right;  CDE 19.32   CATARACT EXTRACTION W/PHACO  01/21/2011   Procedure: CATARACT EXTRACTION PHACO AND INTRAOCULAR LENS PLACEMENT (IOC);  Surgeon: Tonny Branch;  Location: [facility];  Service: Ophthalmology;  Laterality: Left;  CDE: 21.19   COLONOSCOPY N/A 09/12/2013   Procedure: COLONOSCOPY;  Surgeon: Rogene Houston, MD;  Location: [facility] END[facility] SU[facility];  Service: Endoscopy;  Laterality: N/A;  [place]   LITHOTRIPSY     APH   PARTIAL HYSTERECTOMY     TOTAL HIP ARTHROPLASTY Left 09/11/2019   Procedure: TOTAL HIP ARTHROPLASTY ANTERIOR APPROACH;  Surgeon: Paralee Cancel, MD;  Location: [facility];  Service: Orthopedics;  Laterality: Left;  70 mins   Patient Active Problem List   Diagnosis Date Noted   Aortic atherosclerosis  ([place]) 09/11/2021   Obese 09/12/2019   S/P left THA 09/11/2019   Acute on chronic diastolic CHF (congestive heart failure) ([place]) 08/25/2018   Insulin-requiring or dependent type II diabetes mellitus ([place]) 08/25/2018   Hemoptysis 06/02/2018   Dyspnea 04/17/2018   Pulmonary nodules 04/17/2018   Heme positive stool 08/23/2013   Long term (current) use of anticoagulants 03/03/2012   Chronic diastolic CHF (congestive heart failure) ([place]) 02/28/2012   Hypomagnesemia 02/26/2012   Elevated troponin 02/26/2012   Congestive heart failure ([place]) 02/26/2012   Atrial fibrillation ([place]) 02/25/2012   Chest pain 02/25/2012   CKD (chronic kidney disease), stage IV ([place]) 02/25/2012   Leukocytosis 02/25/2012   Anemia due to chronic illness 02/25/2012   Diabetes ([place]) 02/25/2012   REFERRING PROVIDER: Suella Broad, MD  REFERRING DIAG: Radiculopathy, lumbar region  Rationale for Evaluation and Treatment Rehabilitation  THERAPY DIAG:  Radiculopathy, lumbar region  Muscle weakness (generalized)  ONSET DATE: 5 weeks ago  SUBJECTIVE:  SUBJECTIVE STATEMENT: Patient reports that she felt good after her last appointment. However, her legs were sore after her last appointment. She notes that her pain was a lot more thing morning.  PERTINENT HISTORY:  Multiple back surgeries, left hip replacement, a-fib, CHF, DM, and CKD  PAIN:  Are you having pain? Yes: NPRS scale: 10/10 Pain location: low back radiating into her right anterior thigh and mid shin  Pain description: sharp, soreness, aching, burning Aggravating factors: walking, putting her right foot on the floor Relieving factors: pain medication and biofreeze is the only thing that helps   PRECAUTIONS: None  WEIGHT BEARING RESTRICTIONS No  FALLS:  Has patient  fallen in last 6 months? No  LIVING ENVIRONMENT: Lives with: lives with their family Lives in: House/apartment Stairs: Yes: External: 4-8 steps; can reach both Has following equipment at home: Environmental consultant - 2 wheeled  OCCUPATION: retired  PLOF: [place] reduced pain, return to church (has not been in 3 weeks due to pain), walk and stand longer (she was able to stand at least 10 minutes prior to her LE symptoms beginning 5 weeks ago), sleep better (3 hours currently with medication)   OBJECTIVE:   SCREENING FOR RED FLAGS: Bowel or bladder incontinence: No Spinal tumors: No Cauda equina syndrome: No Compression fracture: No Abdominal aneurysm: No  COGNITION:  Overall cognitive status: Within functional limits for tasks assessed     SENSATION: Patient reports no numbness or tingling  PALPATION: TTP: right lumbar paraspinals, gluteals, piriformis, IT band, quadriceps, hamstring, anterior tibialis, and gastroc/soleus  JOINT MOBILITY:  L3-5: reproduced sharp low back pain and hypomobile   LUMBAR ROM: unable to safely be assessed at this time  LOWER EXTREMITY ROM:  WFL for activities assessed  LOWER EXTREMITY MMT:    MMT Right eval Left eval  Hip flexion 3+/5 4-/5  Hip extension    Hip abduction    Hip adduction    Hip internal rotation    Hip external rotation    Knee flexion 3/5 4+/5  Knee extension 3/5 with familiar thigh pain 4+/5  Ankle dorsiflexion 3+/5 4-/5  Ankle plantarflexion    Ankle inversion    Ankle eversion     (Blank rows = not tested)  FUNCTIONAL TESTS:  Supine to sit transfer: modA  Sit to stand: required UE support   GAIT: Assistive device utilized: Environmental consultant - 2 wheeled Level of assistance: Modified independence Comments: decreased gait speed, stride length, absent heel strike and toe off bilaterally    TODAY'S TREATMENT                                    9/6 EXERCISE LOG  Exercise Repetitions and Resistance Comments   Nustep L3 x 15 minutes   SAQ 2 minutes RLE only   Slouch over correct 2 minutes            Blank cell = exercise not performed today  Manual Therapy Soft Tissue Mobilization: right quadriceps, for reduced pain   Modalities  Date:  Unattended Estim: right quadriceps and hip adductors, IFC @ 80-150 Hz w/ 40% scan, 15 mins, Pain Cold Pack: Lumbar, 15 mins, Pain                                   8/30 EXERCISE LOG  Exercise Repetitions and Resistance  Comments  Nustep  L4 x 15 minutes   LAQ 4# x 2 minutes   Slouch/ overcorrect  20 reps   Standing march  20 reps        Blank cell = exercise not performed today  Manual Therapy Soft Tissue Mobilization: right quadriceps, IT band, and hip adductors, for reduced pain                                     8/2/8 EXERCISE LOG  Exercise Repetitions and Resistance Comments  Lower trunk rotation 20 reps    Hip adduction isometric 5 second hold x 15 reps   Bridge  15 reps            Blank cell = exercise not performed today    PATIENT EDUCATION:  Education details: HEP, healing, prognosis, POC Person educated: Patient and Child(ren) Education method: Explanation Education comprehension: verbalized understanding   HOME EXERCISE PROGRAM: Today's interventions were provided as her HEP. Patient and her daughter declined a handout.   ASSESSMENT:  CLINICAL IMPRESSION: Patient presented to treatment reporting increased right lower extremity pain that began this morning. Treatment focused on manual therapy and familiar interventions for reduced pain with these being moderately effective. Soft tissue mobilization and modalities to her right quadriceps were the most effective at reducing her familiar symptoms. She reported feeling better upon the conclusion of treatment. Recommend that she continue with skilled physical therapy to address her remaining impairments to maximize her safety and functional mobility.   PHYSICAL THERAPY DISCHARGE  SUMMARY  Visits from Start of Care: 3  Current functional level related to goals / functional outcomes: Patient was unable to meet her goals for skilled physical therapy.    Remaining deficits: Pain    Education / Equipment: HEP    Patient agrees to discharge. Patient goals were not met. Patient is being discharged due to not returning since the last visit.  Jacqulynn Cadet, PT, DPT    OBJECTIVE IMPAIRMENTS Abnormal gait, decreased mobility, difficulty walking, decreased strength, hypomobility, impaired tone, and pain.   ACTIVITY LIMITATIONS carrying, lifting, bending, standing, squatting, sleeping, transfers, bed mobility, and locomotion level  PARTICIPATION LIMITATIONS: meal prep, cleaning, and community activity  PERSONAL FACTORS Age, Time since onset of injury/illness/exacerbation, Transportation, and 3+ comorbidities: a-fib, CHF, DM, and CKD  are also affecting patient's functional outcome.   REHAB POTENTIAL: Fair    CLINICAL DECISION MAKING: Evolving/moderate complexity  EVALUATION COMPLEXITY: Moderate   GOALS: Goals reviewed with patient? No  LONG TERM GOALS: Target date: 12/14/2021  Patient will be independent with her HEP.  Baseline:  Goal status: INITIAL  2.  Patient will be able to stand for at least 10 minutes for improved function with household activities such as cooking.  Baseline:  Goal status: INITIAL  3.  Patient will be able to complete her daily activities without her familiar pain exceeding 6/10. Baseline:  Goal status: INITIAL  4.  Patient will be able to return to church without being limited by her familiar low back and right leg pain.  Baseline:  Goal status: INITIAL  PLAN: PT FREQUENCY: 2x/week  PT DURATION: 4 weeks  PLANNED INTERVENTIONS: Therapeutic exercises, Therapeutic activity, Neuromuscular re-education, Balance training, Gait training, Patient/Family education, Self Care, Joint mobilization, Stair training, Electrical  stimulation, Spinal mobilization, Cryotherapy, Moist heat, Taping, Manual therapy, and Re-evaluation.  PLAN FOR NEXT SESSION: nustep, review HEP, clams, hip abduction, and  modalities as needed   Darlin Coco, PT 11/25/2021, 3:16 PM

## 2021-11-30 DIAGNOSIS — M5459 Other low back pain: Secondary | ICD-10-CM | POA: Diagnosis not present

## 2021-12-02 ENCOUNTER — Encounter: Payer: Medicare HMO | Admitting: Physical Therapy

## 2021-12-05 DIAGNOSIS — M5416 Radiculopathy, lumbar region: Secondary | ICD-10-CM | POA: Diagnosis not present

## 2021-12-08 ENCOUNTER — Telehealth: Payer: Self-pay | Admitting: Cardiology

## 2021-12-08 DIAGNOSIS — Z299 Encounter for prophylactic measures, unspecified: Secondary | ICD-10-CM | POA: Diagnosis not present

## 2021-12-08 DIAGNOSIS — E1122 Type 2 diabetes mellitus with diabetic chronic kidney disease: Secondary | ICD-10-CM | POA: Diagnosis not present

## 2021-12-08 DIAGNOSIS — N184 Chronic kidney disease, stage 4 (severe): Secondary | ICD-10-CM | POA: Diagnosis not present

## 2021-12-08 DIAGNOSIS — E7800 Pure hypercholesterolemia, unspecified: Secondary | ICD-10-CM | POA: Diagnosis not present

## 2021-12-08 DIAGNOSIS — I10 Essential (primary) hypertension: Secondary | ICD-10-CM | POA: Diagnosis not present

## 2021-12-08 NOTE — Telephone Encounter (Signed)
   Patient Name: Miranda Garcia  DOB: [birth year] MRN: [id number]  Primary Cardiologist: Carlyle Dolly, MD  Chart reviewed as part of pre-operative protocol coverage. Pre-op clearance already addressed by colleagues in earlier phone notes. To summarize recommendations:  -Prefer to hold Eliquis for 3 days for spinal procedures. Procedure is scheduled in 2 days. Will need to be rescheduled.  Medical clearance was not requested  Will route this bundled recommendation to requesting provider via Epic fax function and remove from pre-op pool. Please call with questions.  Elgie Collard, PA-C 12/08/2021, 4:51 PM

## 2021-12-08 NOTE — Telephone Encounter (Signed)
Patient with diagnosis of A Fib on Eliquis for anticoagulation.    Procedure: Lumbar ESI Date of procedure: 12/10/21   CHA2DS2-VASc Score = 7  This indicates a 11.2% annual risk of stroke. The patient's score is based upon: CHF History: 1 HTN History: 1 Diabetes History: 1 Stroke History: 0 Vascular Disease History: 1 Age Score: 2 Gender Score: 1   CrCl 18 mL/min Platelet count 155K  Prefer to hold Eliquis for 3 days for spinal procedures. Procedure is scheduled in 2 days. Will need to be rescheduled.  **This guidance is not considered finalized until pre-operative APP has relayed final recommendations.**

## 2021-12-08 NOTE — Telephone Encounter (Unsigned)
   Pre-operative Risk Assessment    Patient Name: ATHEA HALEY  DOB: [birth year] MRN: [id number]{ 1}      Request for Surgical Clearance{ 1. What type of surgery is being performed? Enter name of procedure below and number of teeth if dental extraction.  :1}    Procedure:   Lumbar ESI { 2. When is this surgery scheduled? Press F2 to enter date below and place date in Reason for Visit (see directions below). :1} Date of Surgery:  Clearance 12/10/21                               { 3. What is the name of the Surgeon, the Surgeon's Group or Practice, phone and fax number?  Press F2 and list below :1}  Surgeon:  n/a Surgeon's Group or Practice Name:  [facility] Phone number:  [phone] ext [phone] Fax number:  [phone] { 4. What type of clearance is requested?  Medical or Cardiac Clearance only?  Pharmacy Clearance Only (Request is to hold medication only)?  Or Both?  Press F2 and select the clearance requested.  If both are needed, select both from the drop down list.     :1}  Type of Clearance Requested:   - Pharmacy:  Hold Apixaban (Eliquis) 3 days  { 5. What type of anesthesia will be used?  Press F2 and select the anesthesia to be used for the procedure.  :1}  Type of Anesthesia:  Not Indicated .  Additional requests/questions:  Please advise surgeon/provider what medications should be held.  Signed, Desma Paganini   12/08/2021, 8:22 AM

## 2021-12-10 DIAGNOSIS — M5416 Radiculopathy, lumbar region: Secondary | ICD-10-CM | POA: Diagnosis not present

## 2021-12-17 DIAGNOSIS — H353132 Nonexudative age-related macular degeneration, bilateral, intermediate dry stage: Secondary | ICD-10-CM | POA: Diagnosis not present

## 2021-12-18 DIAGNOSIS — R42 Dizziness and giddiness: Secondary | ICD-10-CM | POA: Diagnosis not present

## 2021-12-18 DIAGNOSIS — H532 Diplopia: Secondary | ICD-10-CM | POA: Diagnosis not present

## 2021-12-18 DIAGNOSIS — Z6831 Body mass index (BMI) 31.0-31.9, adult: Secondary | ICD-10-CM | POA: Diagnosis not present

## 2021-12-18 DIAGNOSIS — I10 Essential (primary) hypertension: Secondary | ICD-10-CM | POA: Diagnosis not present

## 2021-12-18 DIAGNOSIS — Z299 Encounter for prophylactic measures, unspecified: Secondary | ICD-10-CM | POA: Diagnosis not present

## 2021-12-24 DIAGNOSIS — N184 Chronic kidney disease, stage 4 (severe): Secondary | ICD-10-CM | POA: Diagnosis not present

## 2021-12-24 DIAGNOSIS — M5459 Other low back pain: Secondary | ICD-10-CM | POA: Diagnosis not present

## 2021-12-24 DIAGNOSIS — M5136 Other intervertebral disc degeneration, lumbar region: Secondary | ICD-10-CM | POA: Diagnosis not present

## 2021-12-24 DIAGNOSIS — M5416 Radiculopathy, lumbar region: Secondary | ICD-10-CM | POA: Diagnosis not present

## 2021-12-29 DIAGNOSIS — G319 Degenerative disease of nervous system, unspecified: Secondary | ICD-10-CM | POA: Diagnosis not present

## 2021-12-29 DIAGNOSIS — R42 Dizziness and giddiness: Secondary | ICD-10-CM | POA: Diagnosis not present

## 2021-12-29 DIAGNOSIS — H532 Diplopia: Secondary | ICD-10-CM | POA: Diagnosis not present

## 2022-01-07 DIAGNOSIS — H0015 Chalazion left lower eyelid: Secondary | ICD-10-CM | POA: Diagnosis not present

## 2022-01-07 DIAGNOSIS — H02132 Senile ectropion of right lower eyelid: Secondary | ICD-10-CM | POA: Diagnosis not present

## 2022-01-07 DIAGNOSIS — H02135 Senile ectropion of left lower eyelid: Secondary | ICD-10-CM | POA: Diagnosis not present

## 2022-01-07 DIAGNOSIS — H0279 Other degenerative disorders of eyelid and periocular area: Secondary | ICD-10-CM | POA: Diagnosis not present

## 2022-01-14 IMAGING — RF DG HIP (WITH PELVIS) OPERATIVE*L*
1 series · 2 of 2 positions shown · non-contrast
Comparison: None.

CLINICAL DATA: Hip replacement

EXAM:
OPERATIVE left HIP (WITH PELVIS IF PERFORMED) 2 VIEWS
TECHNIQUE: Fluoroscopic spot image(s) were submitted for interpretation
post-operatively.

[Series 1: unknown protocol · 0.20mm/px · 2 of 2 slices shown]
[im 1/2]
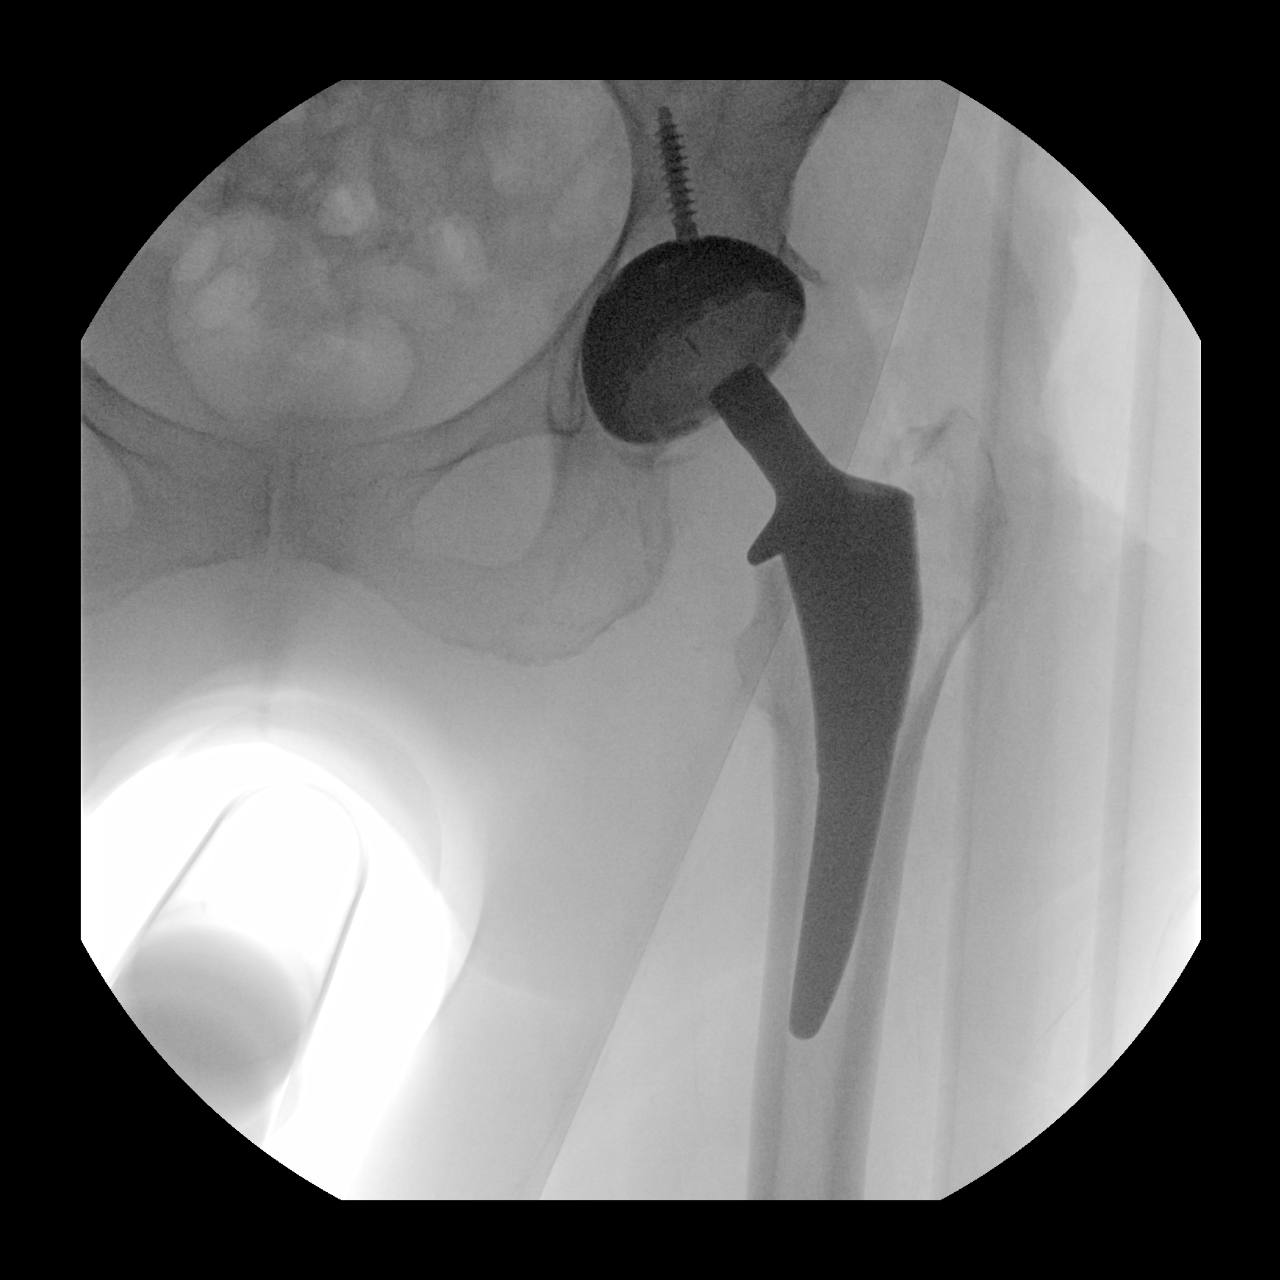
[im 2/2]
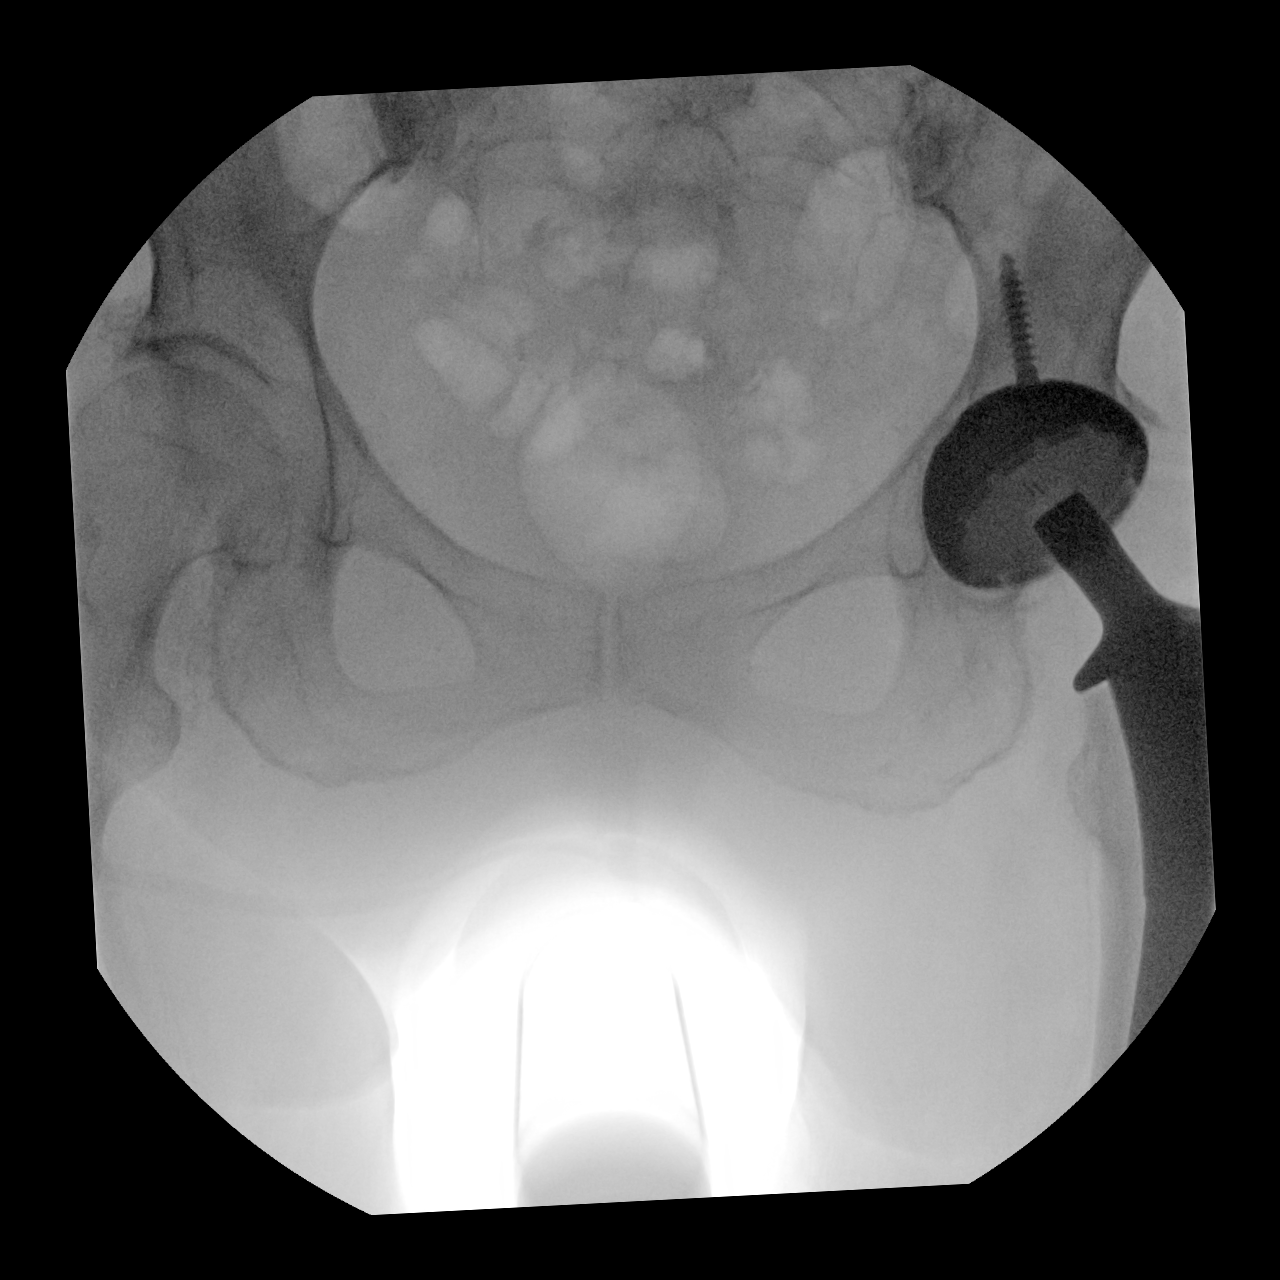

[2 of 2 positions shown; findings below may reference images not displayed]

FINDINGS: Two low resolution intraoperative spot views of the left hip. Total
fluoroscopy time was 12 seconds. Images were obtained during
operative placement of left hip replacement.
IMPRESSION: Intraoperative fluoroscopic assistance provided during left hip
replacement surgery

## 2022-01-26 ENCOUNTER — Other Ambulatory Visit: Payer: Self-pay | Admitting: Cardiology

## 2022-01-28 DIAGNOSIS — H43812 Vitreous degeneration, left eye: Secondary | ICD-10-CM | POA: Diagnosis not present

## 2022-01-28 DIAGNOSIS — H353132 Nonexudative age-related macular degeneration, bilateral, intermediate dry stage: Secondary | ICD-10-CM | POA: Diagnosis not present

## 2022-01-28 DIAGNOSIS — H43392 Other vitreous opacities, left eye: Secondary | ICD-10-CM | POA: Diagnosis not present

## 2022-01-28 DIAGNOSIS — E119 Type 2 diabetes mellitus without complications: Secondary | ICD-10-CM | POA: Diagnosis not present

## 2022-03-01 DIAGNOSIS — J069 Acute upper respiratory infection, unspecified: Secondary | ICD-10-CM | POA: Diagnosis not present

## 2022-03-01 DIAGNOSIS — I10 Essential (primary) hypertension: Secondary | ICD-10-CM | POA: Diagnosis not present

## 2022-03-05 ENCOUNTER — Emergency Department (HOSPITAL_COMMUNITY): Payer: Medicare HMO

## 2022-03-05 ENCOUNTER — Encounter (HOSPITAL_COMMUNITY): Payer: Self-pay | Admitting: *Deleted

## 2022-03-05 ENCOUNTER — Emergency Department (HOSPITAL_COMMUNITY)
Admission: EM | Admit: 2022-03-05 | Discharge: 2022-03-05 | Disposition: A | Discharge status: Home / Self Care | Payer: Medicare HMO | Attending: Emergency Medicine | Admitting: Emergency Medicine

## 2022-03-05 ENCOUNTER — Other Ambulatory Visit: Payer: Self-pay

## 2022-03-05 DIAGNOSIS — Z299 Encounter for prophylactic measures, unspecified: Secondary | ICD-10-CM | POA: Diagnosis not present

## 2022-03-05 DIAGNOSIS — J449 Chronic obstructive pulmonary disease, unspecified: Secondary | ICD-10-CM | POA: Diagnosis not present

## 2022-03-05 DIAGNOSIS — R531 Weakness: Secondary | ICD-10-CM | POA: Insufficient documentation

## 2022-03-05 DIAGNOSIS — J101 Influenza due to other identified influenza virus with other respiratory manifestations: Secondary | ICD-10-CM | POA: Diagnosis not present

## 2022-03-05 DIAGNOSIS — R059 Cough, unspecified: Secondary | ICD-10-CM | POA: Diagnosis not present

## 2022-03-05 DIAGNOSIS — J111 Influenza due to unidentified influenza virus with other respiratory manifestations: Secondary | ICD-10-CM | POA: Insufficient documentation

## 2022-03-05 DIAGNOSIS — J069 Acute upper respiratory infection, unspecified: Secondary | ICD-10-CM | POA: Diagnosis not present

## 2022-03-05 DIAGNOSIS — R0602 Shortness of breath: Secondary | ICD-10-CM | POA: Insufficient documentation

## 2022-03-05 DIAGNOSIS — R0601 Orthopnea: Secondary | ICD-10-CM | POA: Diagnosis not present

## 2022-03-05 DIAGNOSIS — R051 Acute cough: Secondary | ICD-10-CM

## 2022-03-05 DIAGNOSIS — Z1152 Encounter for screening for COVID-19: Secondary | ICD-10-CM | POA: Diagnosis not present

## 2022-03-05 DIAGNOSIS — R609 Edema, unspecified: Secondary | ICD-10-CM | POA: Diagnosis not present

## 2022-03-05 LAB — RESP PANEL BY RT-PCR (RSV, FLU A&B, COVID)  RVPGX2
Influenza A by PCR: POSITIVE — AB
Influenza B by PCR: NEGATIVE
Resp Syncytial Virus by PCR: NEGATIVE
SARS Coronavirus 2 by RT PCR: NEGATIVE

## 2022-03-05 MED ORDER — OSELTAMIVIR PHOSPHATE 75 MG PO CAPS: 75.0000 mg | ORAL_CAPSULE | Freq: Two times a day (BID) | ORAL | 0 refills | Status: DC

## 2022-03-05 MED ORDER — ALBUTEROL SULFATE HFA 108 (90 BASE) MCG/ACT IN AERS: 1.0000 | INHALATION_SPRAY | Freq: Four times a day (QID) | RESPIRATORY_TRACT | 0 refills | Status: DC | PRN

## 2022-03-05 NOTE — ED Provider Notes (Signed)
[facility] EMERGENCY DEPARTMENT Provider Note   CSN: [id number] Arrival date & time: 03/05/22  1235     History  Chief Complaint  Patient presents with   Cough    Miranda Garcia is a 88 y.o. female.  Patient has a history of heart failure and diabetes.  She has had a cough that has been nonproductive.  Patient complains of some shortness of breath and weakness  The history is provided by the patient.  Cough Cough characteristics:  Non-productive Sputum characteristics:  Nondescript Severity:  Moderate Onset quality:  Sudden Timing:  Constant Progression:  Waxing and waning Chronicity:  New Smoker: no   Context: not animal exposure   Relieved by:  Nothing Associated symptoms: no chest pain, no eye discharge, no headaches and no rash        Home Medications Prior to Admission medications   Medication Sig Start Date End Date Taking? Authorizing Provider  albuterol (VENTOLIN HFA) 108 (90 Base) MCG/ACT inhaler Inhale 1-2 puffs into the lungs every 6 (six) hours as needed for wheezing or shortness of breath. 03/05/22  Yes Bethann Berkshire, MD  oseltamivir (TAMIFLU) 75 MG capsule Take 1 capsule (75 mg total) by mouth every 12 (twelve) hours. 03/05/22  Yes Bethann Berkshire, MD  Calcium Carbonate-Vit D-Min (CALTRATE 600+D PLUS MINERALS) 600-800 MG-UNIT TABS Take 1 tablet by mouth daily.    [provider]  carvedilol (COREG) 12.5 MG tablet Take 12.5 mg by mouth 2 (two) times daily. 06/23/20   [provider]  Cholecalciferol (VITAMIN D3) 10 MCG (400 UNIT) tablet Take 400 Units by mouth daily.    [provider]  diltiazem (CARDIZEM CD) 240 MG 24 hr capsule Take 240 mg by mouth daily. 07/16/20   [provider]  ELIQUIS 2.5 MG TABS tablet TAKE ONE TABLET BY MOUTH TWICE DAILY 10/09/21   Antoine Poche, MD  erythromycin ophthalmic ointment erythromycin 5 mg/gram (0.5 %) eye ointment    [provider]  furosemide (LASIX) 80 MG tablet  Take 80 mg by mouth daily.    [provider]  insulin aspart protamine- aspart (NOVOLOG MIX 70/30) (70-30) 100 UNIT/ML injection Inject 0.12 mLs (12 Units total) into the skin 2 (two) times a day. Patient taking differently: Inject 12-15 Units into the skin 2 (two) times a day. 08/31/18   Alessandra Bevels, MD  Multiple Vitamins-Minerals (PRESERVISION AREDS 2 PO) Take 1 tablet by mouth in the morning and at bedtime.    [provider]  pravastatin (PRAVACHOL) 20 MG tablet TAKE 1 TABLET DAILY IN THE EVENING 01/26/22   Antoine Poche, MD      Allergies    Atorvastatin, Contrast media [iodinated contrast media], and Penicillins    Review of Systems   Review of Systems  Constitutional:  Negative for appetite change and fatigue.  HENT:  Negative for congestion, ear discharge and sinus pressure.   Eyes:  Negative for discharge.  Respiratory:  Positive for cough.   Cardiovascular:  Negative for chest pain.  Gastrointestinal:  Negative for abdominal pain and diarrhea.  Genitourinary:  Negative for frequency and hematuria.  Musculoskeletal:  Negative for back pain.  Skin:  Negative for rash.  Neurological:  Negative for seizures and headaches.  Psychiatric/Behavioral:  Negative for hallucinations.     Physical Exam Updated Vital Signs BP 122/82 (BP Location: Left Arm)   Pulse 76   Temp (!) 97.5 F (36.4 C) (Oral)   Resp 20   Ht 5\' 3"  (1.6  m)   Wt 76.7 kg   SpO2 98%   BMI 29.94 kg/m  Physical Exam Vitals and nursing note reviewed.  Constitutional:      Appearance: She is well-developed.  HENT:     Head: Normocephalic.     Right Ear: External ear normal.     Nose: Nose normal.  Eyes:     General: No scleral icterus.    Conjunctiva/sclera: Conjunctivae normal.  Neck:     Thyroid: No thyromegaly.  Cardiovascular:     Rate and Rhythm: Normal rate and regular rhythm.     Heart sounds: No murmur heard.    No friction rub. No gallop.  Pulmonary:     Breath  sounds: No stridor. No wheezing or rales.  Chest:     Chest wall: No tenderness.  Abdominal:     General: There is no distension.     Tenderness: There is no abdominal tenderness. There is no rebound.  Musculoskeletal:        General: Normal range of motion.     Cervical back: Neck supple.  Lymphadenopathy:     Cervical: No cervical adenopathy.  Skin:    Findings: No erythema or rash.  Neurological:     Mental Status: She is alert and oriented to person, place, and time.     Motor: No abnormal muscle tone.     Coordination: Coordination normal.  Psychiatric:        Behavior: Behavior normal.     ED Results / Procedures / Treatments   Labs (all labs ordered are listed, but only abnormal results are displayed) Labs Reviewed  RESP PANEL BY RT-PCR (RSV, FLU A&B, COVID)  RVPGX2 - Abnormal; Notable for the following components:      Result Value   Influenza A by PCR POSITIVE (*)    All other components within normal limits    EKG None  Radiology DG Chest 2 View  Result Date: 03/05/2022 CLINICAL DATA:  Cough and congestion. EXAM: CHEST - 2 VIEW COMPARISON:  Chest x-ray dated August 25, 2018. FINDINGS: Unchanged mild cardiomegaly. Normal pulmonary vascularity. No focal consolidation, pleural effusion, or pneumothorax. No acute osseous abnormality. IMPRESSION: 1. No acute cardiopulmonary disease. Electronically Signed   By: Obie Dredge M.D.   On: 03/05/2022 13:37    Procedures Procedures    Medications Ordered in ED Medications - No data to display  ED Course/ Medical Decision Making/ A&P                           Medical Decision Making Amount and/or Complexity of Data Reviewed Radiology: ordered.  Risk Prescription drug management.    This patient presents to the ED for concern of cough and weakness, this involves an extensive number of treatment options, and is a complaint that carries with it a high risk of complications and morbidity.  The differential  diagnosis includes viral syndrome, pneumonia   Co morbidities that complicate the patient evaluation  Diabetes and heart failure   Additional history obtained:  Additional history obtained from relative External records from outside source obtained and reviewed including hospital records   Lab Tests:  I Ordered, and personally interpreted labs.  The pertinent results include: Flu test positive   Imaging Studies ordered:  I ordered imaging studies including chest x-ray I independently visualized and interpreted imaging which showed good of I agree with the radiologist interpretation   Cardiac Monitoring: / EKG:  The patient was  maintained on a cardiac monitor.  I personally viewed and interpreted the cardiac monitored which showed an underlying rhythm of: Normal sinus rhythm   Consultations Obtained:  No consultant Problem List / ED Course / Critical interventions / Medication management  Hypertension viral syndrome I ordered medication including Tamiflu for influenza and albuterol for shortness of breath Reevaluation of the patient after these medicines showed that the patient stayed the same I have reviewed the patients home medicines and have made adjustments as needed   Social Determinants of Health:  None   Test / Admission - Considered:  None  Patient with influenza.  She is not hypoxic.  She has been put on Tamiflu and albuterol        Final Clinical Impression(s) / ED Diagnoses Final diagnoses:  Acute cough  Influenza    Rx / DC Orders ED Discharge Orders          Ordered    albuterol (VENTOLIN HFA) 108 (90 Base) MCG/ACT inhaler  Every 6 hours PRN        03/05/22 1451    oseltamivir (TAMIFLU) 75 MG capsule  Every 12 hours        03/05/22 1451              Bethann Berkshire, MD 03/06/22 1026

## 2022-03-05 NOTE — ED Triage Notes (Signed)
Pt with congestion and cough, pt exposed to someone recently that tested positive for flu.

## 2022-03-05 NOTE — Discharge Instructions (Signed)
Follow-up with your family doctor if not improving in the next week.  Drink plenty of fluids

## 2022-03-10 DIAGNOSIS — Z299 Encounter for prophylactic measures, unspecified: Secondary | ICD-10-CM | POA: Diagnosis not present

## 2022-03-10 DIAGNOSIS — I10 Essential (primary) hypertension: Secondary | ICD-10-CM | POA: Diagnosis not present

## 2022-03-10 DIAGNOSIS — J209 Acute bronchitis, unspecified: Secondary | ICD-10-CM | POA: Diagnosis not present

## 2022-03-17 ENCOUNTER — Ambulatory Visit: Payer: Medicare HMO | Attending: Cardiology | Admitting: Cardiology

## 2022-03-17 ENCOUNTER — Encounter: Payer: Self-pay | Admitting: Cardiology

## 2022-03-17 ENCOUNTER — Encounter: Payer: Self-pay | Admitting: *Deleted

## 2022-03-17 VITALS — BP 104/72 | HR 67 | Ht 63.0 in | Wt 168.0 lb

## 2022-03-17 DIAGNOSIS — I10 Essential (primary) hypertension: Secondary | ICD-10-CM | POA: Diagnosis not present

## 2022-03-17 DIAGNOSIS — D6869 Other thrombophilia: Secondary | ICD-10-CM | POA: Diagnosis not present

## 2022-03-17 DIAGNOSIS — I350 Nonrheumatic aortic (valve) stenosis: Secondary | ICD-10-CM

## 2022-03-17 DIAGNOSIS — I5032 Chronic diastolic (congestive) heart failure: Secondary | ICD-10-CM | POA: Diagnosis not present

## 2022-03-17 DIAGNOSIS — I4891 Unspecified atrial fibrillation: Secondary | ICD-10-CM | POA: Diagnosis not present

## 2022-03-17 DIAGNOSIS — I6529 Occlusion and stenosis of unspecified carotid artery: Secondary | ICD-10-CM

## 2022-03-17 NOTE — Patient Instructions (Addendum)

## 2022-03-17 NOTE — Progress Notes (Signed)
Clinical Summary Ms. Fodor is a 88 y.o.female seen today for follow up of the following medical problems.    1. Chronic diastolic HF  - working to limit diuretic due to her renal dysfunction - no recent edema. SOB related to recent flu infection.  - compliant with meds     2. CKD IV - followed by nephorlogy Dr Hollie Salk     3. Aortic stenosis - 09/2021 echo: LVEF 65-70%, mild to moderate AS mean grad 11.8, AVA VTI 1.26, DI 0.45 - no SOB/DOE, chest pain, syncope       3. Dyspnea - extensive pulmonmary and cardiac workup over the last few months.   Jan 2020 PFTs suggestive of restriction, mildly redcued DLCO but corrects for alveolar ventilation    04/2018 echo: LVEF 60-65%, indeterminate diastolic function, reported moderate aortic stenosis - 05/2018 nuclear stress: no ischemia 05/2018 CT chest high resolution: findings suggesting of interstitial lung disease vs alveolar hemorrage.  05/2018 VQ scan: low probability PE, Patchy retention of the ventilation agent centrally may be seen with air trapping or COPD.     4. HTN - compliant with meds     5. Afib -no palpitations - no bleeding issues to eliquis.    6. Hyperlipidemia - she is on pravastatin, labs followed by pcp - leg pains on atorvastaitn.  02/2021 TC 107 TG 100 HDL 51 LDL 37     7. Flu + 03/05/22 - has gotten over respiratory symptoms, still getting strength and energy back   Past Medical History:  Diagnosis Date   Arthritis    back, knees   Atrial fibrillation (HCC)    CHF (congestive heart failure) ([place]) 08/2018   Diabetes mellitus type II, controlled ([place])    x 15 yrs   Dysrhythmia    A-fib   Elevated troponin    Hemorrhoids    History of kidney stones    Hypertension    Neuromuscular disorder (HCC)    numbness in hands    Renal insufficiency    stage iv     Allergies  Allergen Reactions   Atorvastatin Other (See Comments)    Leg pains   Contrast Media [Iodinated Contrast Media]      Pt has 1 full functioning kidney   Penicillins Nausea And Vomiting and Rash    Tolerated Cephalosporin Date: 09/11/19.    Did it involve swelling of the face/tongue/throat, SOB, or low BP? No Did it involve sudden or severe rash/hives, skin peeling, or any reaction on the inside of your mouth or nose? Yes Did you need to seek medical attention at a hospital or doctor's office? Yes When did it last happen?      67 yrs ago If all above answers are "NO", may proceed with cephalosporin use.      Current Outpatient Medications  Medication Sig Dispense Refill   albuterol (VENTOLIN HFA) 108 (90 Base) MCG/ACT inhaler Inhale 1-2 puffs into the lungs every 6 (six) hours as needed for wheezing or shortness of breath. 8 g 0   Calcium Carbonate-Vit D-Min (CALTRATE 600+D PLUS MINERALS) 600-800 MG-UNIT TABS Take 1 tablet by mouth daily.     carvedilol (COREG) 12.5 MG tablet Take 12.5 mg by mouth 2 (two) times daily.     Cholecalciferol (VITAMIN D3) 10 MCG (400 UNIT) tablet Take 400 Units by mouth daily.     diltiazem (CARDIZEM CD) 240 MG 24 hr capsule Take 240 mg by mouth daily.  ELIQUIS 2.5 MG TABS tablet TAKE ONE TABLET BY MOUTH TWICE DAILY 60 tablet 5   erythromycin ophthalmic ointment erythromycin 5 mg/gram (0.5 %) eye ointment     furosemide (LASIX) 80 MG tablet Take 80 mg by mouth daily.     insulin aspart protamine- aspart (NOVOLOG MIX 70/30) (70-30) 100 UNIT/ML injection Inject 0.12 mLs (12 Units total) into the skin 2 (two) times a day. (Patient taking differently: Inject 12-15 Units into the skin 2 (two) times a day.) 10 mL 11   Multiple Vitamins-Minerals (PRESERVISION AREDS 2 PO) Take 1 tablet by mouth in the morning and at bedtime.     oseltamivir (TAMIFLU) 75 MG capsule Take 1 capsule (75 mg total) by mouth every 12 (twelve) hours. 10 capsule 0   pravastatin (PRAVACHOL) 20 MG tablet TAKE 1 TABLET DAILY IN THE EVENING 90 tablet 1   No current facility-administered medications for  this visit.   Facility-Administered Medications Ordered in Other Visits  Medication Dose Route Frequency Provider Last Rate Last Admin   neomycin-polymyxin-dexameth (MAXITROL) 0.1 % ophth ointment    PRN Tonny Jushua Waltman, MD   1 application  at 29/52/84 1427     Past Surgical History:  Procedure Laterality Date   [place]   [facility]   CATARACT EXTRACTION [facility]  01/11/2011   Procedure: CATARACT EXTRACTION PHACO AND INTRAOCULAR LENS PLACEMENT (IOC);  Surgeon: Tonny Ivie Savitt;  Location: [facility] ORS;  Service: Ophthalmology;  Laterality: Right;  CDE 19.32   CATARACT EXTRACTION W/PHACO  01/21/2011   Procedure: CATARACT EXTRACTION PHACO AND INTRAOCULAR LENS PLACEMENT (IOC);  Surgeon: Tonny Conor Lata;  Location: [facility] ORS;  Service: Ophthalmology;  Laterality: Left;  CDE: 21.19   COLONOSCOPY N/A 09/12/2013   Procedure: COLONOSCOPY;  Surgeon: Rogene Houston, MD;  Location: [facility] ENDO SUITE;  Service: Endoscopy;  Laterality: N/A;  [place]   LITHOTRIPSY     [facility]   PARTIAL HYSTERECTOMY     TOTAL HIP ARTHROPLASTY Left 09/11/2019   Procedure: TOTAL HIP ARTHROPLASTY ANTERIOR APPROACH;  Surgeon: Paralee Cancel, MD;  Location: [facility] ORS;  Service: Orthopedics;  Laterality: Left;  70 mins     Allergies  Allergen Reactions   Atorvastatin Other (See Comments)    Leg pains   Contrast Media [Iodinated Contrast Media]     Pt has 1 full functioning kidney   Penicillins Nausea And Vomiting and Rash    Tolerated Cephalosporin Date: 09/11/19.    Did it involve swelling of the face/tongue/throat, SOB, or low BP? No Did it involve sudden or severe rash/hives, skin peeling, or any reaction on the inside of your mouth or nose? Yes Did you need to seek medical attention at a hospital or doctor's office? Yes When did it last happen?      67 yrs ago If all above answers are "NO", may proceed with cephalosporin use.       Family History  Problem Relation Age of Onset   Colon cancer Brother       Social History Ms. Southgate reports that she has never smoked. She has never used smokeless tobacco. Ms. Eilts reports no history of alcohol use.   Review of Systems CONSTITUTIONAL: No weight loss, fever, chills, weakness or fatigue.  HEENT: Eyes: No visual loss, blurred vision, double vision or yellow sclerae.No hearing loss, sneezing, congestion, runny nose or sore throat.  SKIN: No rash or itching.  CARDIOVASCULAR: per hpi RESPIRATORY: No shortness of breath, cough or  sputum.  GASTROINTESTINAL: No anorexia, nausea, vomiting or diarrhea. No abdominal pain or blood.  GENITOURINARY: No burning on urination, no polyuria NEUROLOGICAL: No headache, dizziness, syncope, paralysis, ataxia, numbness or tingling in the extremities. No change in bowel or bladder control.  MUSCULOSKELETAL: No muscle, back pain, joint pain or stiffness.  LYMPHATICS: No enlarged nodes. No history of splenectomy.  PSYCHIATRIC: No history of depression or anxiety.  ENDOCRINOLOGIC: No reports of sweating, cold or heat intolerance. No polyuria or polydipsia.  Marland Kitchen   Physical Examination Today's Vitals   03/17/22 1258  BP: 104/72  Pulse: 67  SpO2: 98%  Weight: 168 lb (76.2 kg)  Height: '5\' 3"'$  (1.6 m)  PainSc: 0-No pain   Body mass index is 29.76 kg/m.  Gen: resting comfortably, no acute distress HEENT: no scleral icterus, pupils equal round and reactive, no palptable cervical adenopathy,  CV: irreg, 2/6 systolic murmur rusb, no jvd Resp: Clear to auscultation bilaterally GI: abdomen is soft, non-tender, non-distended, normal bowel sounds, no hepatosplenomegaly MSK: extremities are warm, no edema.  Skin: warm, no rash Neuro:  no focal deficits Psych: appropriate affect   Diagnostic Studies  04/2019 echo IMPRESSIONS     1. Left ventricular ejection fraction, by estimation, is 60 to 65%. The  left ventricle has normal function. The left ventricle has no regional  wall motion abnormalities.  There is mild concentric left ventricular  hypertrophy. Left ventricular diastolic  parameters are indeterminate. Elevated left ventricular end-diastolic  pressure.   2. Right ventricular systolic function is low normal. The right  ventricular size is normal. There is moderately elevated pulmonary artery  systolic pressure.   3. Left atrial size was severely dilated.   4. Right atrial size was mildly dilated.   5. The mitral valve is degenerative. Moderate mitral valve regurgitation.   6. Tricuspid valve regurgitation is moderate.   7. Aortic valve is functionally bicuspid. Morphologically, there appears  to be moderate calcific aortic stenosis but peak velocities and gradients  are lower (in mild range). The aortic valve is abnormal. Aortic valve  regurgitation is not visualized.  Mild to moderate aortic valve stenosis. Aortic valve area, by VTI measures  1.10 cm. Aortic valve mean gradient measures 11.5 mmHg. Aortic valve Vmax  measures 2.22 m/s.   8. The inferior vena cava is normal in size with <50% respiratory  variability, suggesting right atrial pressure of 8 mmHg.     08/2020 echo LVEF 03-49%, indet diastolic fxn, normal RV, severe LAE, small to mod pericardial effusion, mild to mod MR, mod to severe AS  09/2021 echo 1. Left ventricular ejection fraction, by estimation, is 65 to 70%. The  left ventricle has normal function. The left ventricle has no regional  wall motion abnormalities. There is moderate concentric left ventricular  hypertrophy. Left ventricular  diastolic parameters are indeterminate.   2. Right ventricular systolic function is normal. The right ventricular  size is normal. There is mildly elevated pulmonary artery systolic  pressure. The estimated right ventricular systolic pressure is 17.9 mmHg.   3. Left atrial size was severely dilated.   4. A small to moderate pericardial effusion is present. The pericardial  effusion is posterior to the left  ventricle.   5. The mitral valve is abnormal. Mild mitral valve regurgitation. Severe  mitral annular calcification.   6. The aortic valve is tricuspid. There is moderate calcification of the  aortic valve. Aortic valve regurgitation is not visualized. Moderate  aortic valve stenosis. Aortic valve area, by  VTI measures 1.26 cm. Aortic  valve mean gradient measures 11.8  mmHg. Aortic valve Vmax measures 2.23 m/s. Dimentionless index 0.45.   7. The inferior vena cava is normal in size with greater than 50%  respiratory variability, suggesting right atrial pressure of 3 mmHg.   09/2021 carotid US Summary:  Right Carotid: Velocities in the right ICA are consistent with a 1-39%  stenosis.   Left Carotid: Velocities in the left ICA are consistent with a 1-39%  stenosis.               Non-hemodynamically significant plaque <50% noted in the  CCA.   Vertebrals: Bilateral vertebral arteries demonstrate antegrade flow.  Subclavians: Normal flow hemodynamics were seen in bilateral subclavian               arteries.   Assessment and Plan   1. Chronic diastolic HF - she is euvolemic, continue current meds     2. Aortic stenosis -mild to modrate by most recent echo - continue to monitor    3. Afib/acquired thrombophlia - no symptoms, continue current meds - low dose eliquis based on age and kidney function   4. HTN - at goal, continue current meds      5. Carotid stenosis - recent US mild bilateral disease, continue to monitor  F/u 52month     JArnoldo Lenis M.D.

## 2022-03-19 ENCOUNTER — Encounter: Payer: Self-pay | Admitting: Cardiology

## 2022-04-02 DIAGNOSIS — I129 Hypertensive chronic kidney disease with stage 1 through stage 4 chronic kidney disease, or unspecified chronic kidney disease: Secondary | ICD-10-CM | POA: Diagnosis not present

## 2022-04-02 DIAGNOSIS — N184 Chronic kidney disease, stage 4 (severe): Secondary | ICD-10-CM | POA: Diagnosis not present

## 2022-04-02 DIAGNOSIS — N189 Chronic kidney disease, unspecified: Secondary | ICD-10-CM | POA: Diagnosis not present

## 2022-04-02 DIAGNOSIS — N2581 Secondary hyperparathyroidism of renal origin: Secondary | ICD-10-CM | POA: Diagnosis not present

## 2022-04-02 DIAGNOSIS — I5032 Chronic diastolic (congestive) heart failure: Secondary | ICD-10-CM | POA: Diagnosis not present

## 2022-04-02 DIAGNOSIS — D631 Anemia in chronic kidney disease: Secondary | ICD-10-CM | POA: Diagnosis not present

## 2022-04-02 DIAGNOSIS — I480 Paroxysmal atrial fibrillation: Secondary | ICD-10-CM | POA: Diagnosis not present

## 2022-04-02 DIAGNOSIS — E1122 Type 2 diabetes mellitus with diabetic chronic kidney disease: Secondary | ICD-10-CM | POA: Diagnosis not present

## 2022-04-02 DIAGNOSIS — N390 Urinary tract infection, site not specified: Secondary | ICD-10-CM | POA: Diagnosis not present

## 2022-04-06 DIAGNOSIS — I10 Essential (primary) hypertension: Secondary | ICD-10-CM | POA: Diagnosis not present

## 2022-04-06 DIAGNOSIS — Z299 Encounter for prophylactic measures, unspecified: Secondary | ICD-10-CM | POA: Diagnosis not present

## 2022-04-06 DIAGNOSIS — E1165 Type 2 diabetes mellitus with hyperglycemia: Secondary | ICD-10-CM | POA: Diagnosis not present

## 2022-04-06 DIAGNOSIS — N184 Chronic kidney disease, stage 4 (severe): Secondary | ICD-10-CM | POA: Diagnosis not present

## 2022-04-06 DIAGNOSIS — E1122 Type 2 diabetes mellitus with diabetic chronic kidney disease: Secondary | ICD-10-CM | POA: Diagnosis not present

## 2022-04-06 DIAGNOSIS — I5032 Chronic diastolic (congestive) heart failure: Secondary | ICD-10-CM | POA: Diagnosis not present

## 2022-04-16 DIAGNOSIS — H04123 Dry eye syndrome of bilateral lacrimal glands: Secondary | ICD-10-CM | POA: Diagnosis not present

## 2022-05-04 ENCOUNTER — Other Ambulatory Visit: Payer: Self-pay | Admitting: Cardiology

## 2022-05-04 DIAGNOSIS — I482 Chronic atrial fibrillation, unspecified: Secondary | ICD-10-CM

## 2022-05-04 NOTE — Telephone Encounter (Signed)
Prescription refill request for Eliquis received. Indication: AF Last office visit:  03/17/22  Zandra Abts MD Scr: 2.43 on 10/29/21  Epic Age: 89 Weight: 76.2kg  Based on above findings Eliquis 2.63m twice daily is the appropriate dose.  Refill approved.

## 2022-05-19 DIAGNOSIS — M5416 Radiculopathy, lumbar region: Secondary | ICD-10-CM | POA: Diagnosis not present

## 2022-05-19 DIAGNOSIS — M5459 Other low back pain: Secondary | ICD-10-CM | POA: Diagnosis not present

## 2022-05-19 DIAGNOSIS — M5451 Vertebrogenic low back pain: Secondary | ICD-10-CM | POA: Diagnosis not present

## 2022-05-19 DIAGNOSIS — M5136 Other intervertebral disc degeneration, lumbar region: Secondary | ICD-10-CM | POA: Diagnosis not present

## 2022-05-19 DIAGNOSIS — N184 Chronic kidney disease, stage 4 (severe): Secondary | ICD-10-CM | POA: Diagnosis not present

## 2022-05-24 ENCOUNTER — Other Ambulatory Visit: Payer: Medicare HMO

## 2022-06-01 DIAGNOSIS — Z79899 Other long term (current) drug therapy: Secondary | ICD-10-CM | POA: Diagnosis not present

## 2022-06-01 DIAGNOSIS — M5416 Radiculopathy, lumbar region: Secondary | ICD-10-CM | POA: Diagnosis not present

## 2022-06-01 DIAGNOSIS — Z5181 Encounter for therapeutic drug level monitoring: Secondary | ICD-10-CM | POA: Diagnosis not present

## 2022-06-14 DIAGNOSIS — H00012 Hordeolum externum right lower eyelid: Secondary | ICD-10-CM | POA: Diagnosis not present

## 2022-06-17 DIAGNOSIS — M5416 Radiculopathy, lumbar region: Secondary | ICD-10-CM | POA: Diagnosis not present

## 2022-06-17 DIAGNOSIS — M5136 Other intervertebral disc degeneration, lumbar region: Secondary | ICD-10-CM | POA: Diagnosis not present

## 2022-06-17 DIAGNOSIS — D6869 Other thrombophilia: Secondary | ICD-10-CM | POA: Diagnosis not present

## 2022-07-08 DIAGNOSIS — E1151 Type 2 diabetes mellitus with diabetic peripheral angiopathy without gangrene: Secondary | ICD-10-CM | POA: Diagnosis not present

## 2022-07-08 DIAGNOSIS — K5900 Constipation, unspecified: Secondary | ICD-10-CM | POA: Diagnosis not present

## 2022-07-08 DIAGNOSIS — R32 Unspecified urinary incontinence: Secondary | ICD-10-CM | POA: Diagnosis not present

## 2022-07-08 DIAGNOSIS — I4891 Unspecified atrial fibrillation: Secondary | ICD-10-CM | POA: Diagnosis not present

## 2022-07-08 DIAGNOSIS — E785 Hyperlipidemia, unspecified: Secondary | ICD-10-CM | POA: Diagnosis not present

## 2022-07-08 DIAGNOSIS — Z008 Encounter for other general examination: Secondary | ICD-10-CM | POA: Diagnosis not present

## 2022-07-08 DIAGNOSIS — J449 Chronic obstructive pulmonary disease, unspecified: Secondary | ICD-10-CM | POA: Diagnosis not present

## 2022-07-08 DIAGNOSIS — I509 Heart failure, unspecified: Secondary | ICD-10-CM | POA: Diagnosis not present

## 2022-07-08 DIAGNOSIS — M1990 Unspecified osteoarthritis, unspecified site: Secondary | ICD-10-CM | POA: Diagnosis not present

## 2022-07-08 DIAGNOSIS — I2510 Atherosclerotic heart disease of native coronary artery without angina pectoris: Secondary | ICD-10-CM | POA: Diagnosis not present

## 2022-07-08 DIAGNOSIS — D6869 Other thrombophilia: Secondary | ICD-10-CM | POA: Diagnosis not present

## 2022-07-08 DIAGNOSIS — E669 Obesity, unspecified: Secondary | ICD-10-CM | POA: Diagnosis not present

## 2022-07-08 DIAGNOSIS — M4800 Spinal stenosis, site unspecified: Secondary | ICD-10-CM | POA: Diagnosis not present

## 2022-07-15 DIAGNOSIS — N184 Chronic kidney disease, stage 4 (severe): Secondary | ICD-10-CM | POA: Diagnosis not present

## 2022-07-15 DIAGNOSIS — E1122 Type 2 diabetes mellitus with diabetic chronic kidney disease: Secondary | ICD-10-CM | POA: Diagnosis not present

## 2022-07-15 DIAGNOSIS — I10 Essential (primary) hypertension: Secondary | ICD-10-CM | POA: Diagnosis not present

## 2022-07-15 DIAGNOSIS — J449 Chronic obstructive pulmonary disease, unspecified: Secondary | ICD-10-CM | POA: Diagnosis not present

## 2022-07-15 DIAGNOSIS — Z299 Encounter for prophylactic measures, unspecified: Secondary | ICD-10-CM | POA: Diagnosis not present

## 2022-07-19 ENCOUNTER — Other Ambulatory Visit: Payer: Self-pay | Admitting: Cardiology

## 2022-07-21 DIAGNOSIS — E1122 Type 2 diabetes mellitus with diabetic chronic kidney disease: Secondary | ICD-10-CM | POA: Diagnosis not present

## 2022-07-21 DIAGNOSIS — N189 Chronic kidney disease, unspecified: Secondary | ICD-10-CM | POA: Diagnosis not present

## 2022-07-21 DIAGNOSIS — I129 Hypertensive chronic kidney disease with stage 1 through stage 4 chronic kidney disease, or unspecified chronic kidney disease: Secondary | ICD-10-CM | POA: Diagnosis not present

## 2022-07-21 DIAGNOSIS — D631 Anemia in chronic kidney disease: Secondary | ICD-10-CM | POA: Diagnosis not present

## 2022-07-21 DIAGNOSIS — N2581 Secondary hyperparathyroidism of renal origin: Secondary | ICD-10-CM | POA: Diagnosis not present

## 2022-07-21 DIAGNOSIS — N184 Chronic kidney disease, stage 4 (severe): Secondary | ICD-10-CM | POA: Diagnosis not present

## 2022-07-29 DIAGNOSIS — H353132 Nonexudative age-related macular degeneration, bilateral, intermediate dry stage: Secondary | ICD-10-CM | POA: Diagnosis not present

## 2022-07-29 DIAGNOSIS — H43392 Other vitreous opacities, left eye: Secondary | ICD-10-CM | POA: Diagnosis not present

## 2022-07-29 DIAGNOSIS — H43812 Vitreous degeneration, left eye: Secondary | ICD-10-CM | POA: Diagnosis not present

## 2022-08-26 DIAGNOSIS — M1611 Unilateral primary osteoarthritis, right hip: Secondary | ICD-10-CM | POA: Diagnosis not present

## 2022-08-26 DIAGNOSIS — Z96642 Presence of left artificial hip joint: Secondary | ICD-10-CM | POA: Diagnosis not present

## 2022-08-30 ENCOUNTER — Telehealth: Payer: Self-pay | Admitting: *Deleted

## 2022-08-30 NOTE — Telephone Encounter (Signed)
I s/w the pt and her daughter Miranda Garcia. Tele appt has been made 09/24/22 @ :40. Med rec and consent are done. Pt is going to keep her in office appt with Dr. Wyline Mood.      Patient Consent for Virtual Visit        Miranda Garcia has provided verbal consent on 08/30/2022 for a virtual visit (video or telephone).   CONSENT FOR VIRTUAL VISIT FOR:  Miranda Garcia  By participating in this virtual visit I agree to the following:  I hereby voluntarily request, consent and authorize [facility] and its employed or contracted physicians, physician assistants, nurse practitioners or other licensed health care professionals (the Practitioner), to provide me with telemedicine health care services (the "Services") as deemed necessary by the treating Practitioner. I acknowledge and consent to receive the Services by the Practitioner via telemedicine. I understand that the telemedicine visit will involve communicating with the Practitioner through live audiovisual communication technology and the disclosure of certain medical information by electronic transmission. I acknowledge that I have been given the opportunity to request an in-person assessment or other available alternative prior to the telemedicine visit and am voluntarily participating in the telemedicine visit.  I understand that I have the right to withhold or withdraw my consent to the use of telemedicine in the course of my care at any time, without affecting my right to future care or treatment, and that the Practitioner or I may terminate the telemedicine visit at any time. I understand that I have the right to inspect all information obtained and/or recorded in the course of the telemedicine visit and may receive copies of available information for a reasonable fee.  I understand that some of the potential risks of receiving the Services via telemedicine include:  Delay or interruption in medical evaluation due to technological equipment  failure or disruption; Information transmitted may not be sufficient (e.g. poor resolution of images) to allow for appropriate medical decision making by the Practitioner; and/or  In rare instances, security protocols could fail, causing a breach of personal health information.  Furthermore, I acknowledge that it is my responsibility to provide information about my medical history, conditions and care that is complete and accurate to the best of my ability. I acknowledge that Practitioner's advice, recommendations, and/or decision may be based on factors not within their control, such as incomplete or inaccurate data provided by me or distortions of diagnostic images or specimens that may result from electronic transmissions. I understand that the practice of medicine is not an exact science and that Practitioner makes no warranties or guarantees regarding treatment outcomes. I acknowledge that a copy of this consent can be made available to me via my patient portal [facility] MyChart), or I can request a printed copy by calling the office of [facility].    I understand that my insurance will be billed for this visit.   I have read or had this consent read to me. I understand the contents of this consent, which adequately explains the benefits and risks of the Services being provided via telemedicine.  I have been provided ample opportunity to ask questions regarding this consent and the Services and have had my questions answered to my satisfaction. I give my informed consent for the services to be provided through the use of telemedicine in my medical care

## 2022-08-30 NOTE — Telephone Encounter (Signed)
Patient with diagnosis of afib on Eliquis for anticoagulation.    Procedure: right THA Date of procedure: 10/19/22  CHA2DS2-VASc Score = 7  This indicates a 11.2% annual risk of stroke. The patient's score is based upon: CHF History: 1 HTN History: 1 Diabetes History: 1 Stroke History: 0 Vascular Disease History: 1 Age Score: 2 Gender Score: 1  CrCl 68mL/min Platelet count 152K  Per office protocol, patient can hold Eliquis for 3-4 days prior to procedure.    **This guidance is not considered finalized until pre-operative APP has relayed final recommendations.**

## 2022-08-30 NOTE — Telephone Encounter (Signed)
   Name: SKYYLAR KOPF  DOB: 07-05-1930  MRN: [id number]  Primary Cardiologist: Dina Rich, MD  Chart reviewed as part of pre-operative protocol coverage. Because of Oniya Mandarino Tischer's past medical history and time since last visit, she will require a follow-up telephone visit in order to better assess preoperative cardiovascular risk.  Pre-op covering staff: - Please schedule appointment and call patient to inform them. If patient already had an upcoming appointment within acceptable timeframe, please add "pre-op clearance" to the appointment notes so provider is aware. - Please contact requesting surgeon's office via preferred method (i.e, phone, fax) to inform them of need for appointment prior to surgery.  Per office protocol, patient can hold Eliquis for 3-4 days prior to procedure.   Sharlene Dory, PA-C  08/30/2022, 4:43 PM

## 2022-08-30 NOTE — Telephone Encounter (Signed)
I s/w the pt and her daughter Miranda Garcia. Tele appt has been made 09/24/22 @ :40. Med rec and consent are done. Pt is going to keep her in office appt with Dr. Wyline Mood.

## 2022-08-30 NOTE — Telephone Encounter (Signed)
   Pre-operative Risk Assessment    Patient Name: Miranda Garcia  DOB: 1930/09/16 MRN: [id number]      Request for Surgical Clearance    Procedure:   RIGHT TOTAL HIP ARTHROPLASTY  Date of Surgery:  Clearance 10/19/22                                 Surgeon:  DR. MATTHEW OLIN Surgeon's Group or Practice Name:  [facility] Phone number:  [phone] ATTN: Rosalva Ferron Fax number:  [phone]   Type of Clearance Requested:   - Medical  - Pharmacy:  Hold Apixaban (Eliquis)     Type of Anesthesia:  Spinal   Additional requests/questions:    [name]   08/30/2022, 9:38 AM

## 2022-09-02 DIAGNOSIS — Z1331 Encounter for screening for depression: Secondary | ICD-10-CM | POA: Diagnosis not present

## 2022-09-02 DIAGNOSIS — I700 Atherosclerosis of aorta: Secondary | ICD-10-CM | POA: Diagnosis not present

## 2022-09-02 DIAGNOSIS — Z299 Encounter for prophylactic measures, unspecified: Secondary | ICD-10-CM | POA: Diagnosis not present

## 2022-09-02 DIAGNOSIS — I5032 Chronic diastolic (congestive) heart failure: Secondary | ICD-10-CM | POA: Diagnosis not present

## 2022-09-02 DIAGNOSIS — I739 Peripheral vascular disease, unspecified: Secondary | ICD-10-CM | POA: Diagnosis not present

## 2022-09-02 DIAGNOSIS — I10 Essential (primary) hypertension: Secondary | ICD-10-CM | POA: Diagnosis not present

## 2022-09-02 DIAGNOSIS — Z0000 Encounter for general adult medical examination without abnormal findings: Secondary | ICD-10-CM | POA: Diagnosis not present

## 2022-09-02 DIAGNOSIS — Z7189 Other specified counseling: Secondary | ICD-10-CM | POA: Diagnosis not present

## 2022-09-02 DIAGNOSIS — Z1339 Encounter for screening examination for other mental health and behavioral disorders: Secondary | ICD-10-CM | POA: Diagnosis not present

## 2022-09-02 DIAGNOSIS — Z6830 Body mass index (BMI) 30.0-30.9, adult: Secondary | ICD-10-CM | POA: Diagnosis not present

## 2022-09-24 ENCOUNTER — Ambulatory Visit: Payer: Medicare HMO | Attending: Cardiovascular Disease | Admitting: Student

## 2022-09-24 DIAGNOSIS — Z01810 Encounter for preprocedural cardiovascular examination: Secondary | ICD-10-CM

## 2022-09-24 NOTE — Progress Notes (Addendum)
PCP -  Cardiologist - Dr. Tia Alert 03-17-22 Clearance 09-24-22 Miranda Levering, NP  Nephrology- Dr. Nanci Pina on chart 09-03-22  PPM/ICD -  Device Orders -  Rep Notified -   Chest x-ray - 03-05-22 epic EKG -  Stress Test - 2020 ECHO - 09-21-21 epic Cardiac Cath -   Sleep Study -  CPAP -   Fasting Blood Sugar -  Checks Blood Sugar _____ times a day  Blood Thinner Instructions: Elequis hold 3-4 days prior Aspirin Instructions:  ERAS Protcol - PRE-SURGERY G2-    COVID vaccine -  Activity-- Anesthesia review: Mod. Aortic stenosis, CHF,COPD,DM,Afib, HTN, CKD  Patient denies shortness of breath, fever, cough and chest pain at PAT appointment   All instructions explained to the patient, with a verbal understanding of the material. Patient agrees to go over the instructions while at home for a better understanding. Patient also instructed to self quarantine after being tested for COVID-19. The opportunity to ask questions was provided.

## 2022-09-24 NOTE — Progress Notes (Signed)
Virtual Visit via Telephone Note   Because of Miranda Garcia's co-morbid illnesses, she is at least at moderate risk for complications without adequate follow up.  This format is felt to be most appropriate for this patient at this time.  The patient did not have access to video technology/had technical difficulties with video requiring transitioning to audio format only (telephone).  All issues noted in this document were discussed and addressed.  No physical exam could be performed with this format.  Please refer to the patient's chart for her consent to telehealth for [facility].  Evaluation Performed:  Preoperative cardiovascular risk assessment _____________   Date:  09/24/2022   Patient ID:  Miranda Garcia, DOB 1930/11/14, MRN [id number] Patient Location:  Home Provider location:   Office  Primary Care Provider:  Ignatius Specking, MD Primary Cardiologist:  Dina Rich, MD  Chief Complaint / Patient Profile   89 y.o. y/o female with a h/o chronic diastolic heart failure, PAF on anticoagulation, aortic stenosis, T2DM, CKD stage IV who is pending right total hip arthoplasty by Dr. Charlann Boxer and presents today for telephonic preoperative cardiovascular risk assessment.  History of Present Illness    Miranda Garcia is a 89 y.o. female who presents via audio/video conferencing for a telehealth visit today.  Pt was last seen in cardiology clinic on 03/17/2022 by Dr. Wyline Mood.  At that time Miranda Garcia was doing well.  The patient is now pending procedure as outlined above. Since her last visit, she is doing well. Patient denies shortness of breath or dyspnea on exertion. No chest pain, pressure, or tightness. Denies lower extremity edema, orthopnea, or PND. No palpitations. Her activity is limited secondary to hip pain. She is independent with ADLs and light household activities. She is able to get around short distances using a walker.   Past Medical History    Past  Medical History:  Diagnosis Date   Arthritis    back, knees   Atrial fibrillation (HCC)    CHF (congestive heart failure) (HCC) 08/2018   Diabetes mellitus type II, controlled (HCC)    x 15 yrs   Dysrhythmia    A-fib   Elevated troponin    Hemorrhoids    History of kidney stones    Hypertension    Neuromuscular disorder (HCC)    numbness in hands    Renal insufficiency    stage iv   Past Surgical History:  Procedure Laterality Date   BACK SURGERY  1997   [facility]   CATARACT EXTRACTION W/PHACO  01/11/2011   Procedure: CATARACT EXTRACTION PHACO AND INTRAOCULAR LENS PLACEMENT (IOC);  Surgeon: Gemma Payor;  Location: [facility];  Service: Ophthalmology;  Laterality: Right;  CDE 19.32   CATARACT EXTRACTION W/PHACO  01/21/2011   Procedure: CATARACT EXTRACTION PHACO AND INTRAOCULAR LENS PLACEMENT (IOC);  Surgeon: Gemma Payor;  Location: [facility];  Service: Ophthalmology;  Laterality: Left;  CDE: 21.19   COLONOSCOPY N/A 09/12/2013   Procedure: COLONOSCOPY;  Surgeon: Malissa Hippo, MD;  Location: [facility];  Service: Endoscopy;  Laterality: N/A;  125   CYSTOSCOPY     [place]   LITHOTRIPSY     [facility]   PARTIAL HYSTERECTOMY     TOTAL HIP ARTHROPLASTY Left 09/11/2019   Procedure: TOTAL HIP ARTHROPLASTY ANTERIOR APPROACH;  Surgeon: Durene Romans, MD;  Location: [facility];  Service: Orthopedics;  Laterality: Left;  70 mins    Allergies  Allergies  Allergen Reactions   Atorvastatin  Other (See Comments)    Leg pains   Contrast Media [Iodinated Contrast Media]     Pt has 1 full functioning kidney   Penicillins Nausea And Vomiting and Rash    Tolerated Cephalosporin Date: 09/11/19.    Did it involve swelling of the face/tongue/throat, SOB, or low BP? No Did it involve sudden or severe rash/hives, skin peeling, or any reaction on the inside of your mouth or nose? Yes Did you need to seek medical attention at a hospital or doctor's office? Yes When did it last happen?      67 yrs ago If all  above answers are "NO", may proceed with cephalosporin use.     Home Medications    Prior to Admission medications   Medication Sig Start Date End Date Taking? Authorizing Provider  albuterol (VENTOLIN HFA) 108 (90 Base) MCG/ACT inhaler Inhale 1-2 puffs into the lungs every 6 (six) hours as needed for wheezing or shortness of breath. 03/05/22   Bethann Berkshire, MD  Blood Glucose Calibration (ONETOUCH ULTRA) LIQD  01/19/22   [provider]  Blood Glucose Monitoring Suppl (ONE TOUCH ULTRA 2) w/Device KIT SMARTSIG:Via Meter 01/19/22   [provider]  Calcium Carbonate-Vit D-Min (CALTRATE 600+D PLUS MINERALS) 600-800 MG-UNIT TABS Take 1 tablet by mouth daily.    [provider]  carvedilol (COREG) 12.5 MG tablet Take 12.5 mg by mouth 2 (two) times daily. 06/23/20   [provider]  Cholecalciferol (VITAMIN D3) 10 MCG (400 UNIT) tablet Take 400 Units by mouth daily.    [provider]  diltiazem (CARDIZEM CD) 240 MG 24 hr capsule Take 240 mg by mouth daily. 07/16/20   [provider]  ELIQUIS 2.5 MG TABS tablet TAKE ONE TABLET BY MOUTH TWICE DAILY 05/04/22   Antoine Poche, MD  erythromycin ophthalmic ointment erythromycin 5 mg/gram (0.5 %) eye ointment    [provider]  furosemide (LASIX) 80 MG tablet Take 80 mg by mouth daily.    [provider]  insulin aspart protamine- aspart (NOVOLOG MIX 70/30) (70-30) 100 UNIT/ML injection Inject 0.12 mLs (12 Units total) into the skin 2 (two) times a day. Patient taking differently: Inject 12-15 Units into the skin 2 (two) times a day. 08/31/18   Alessandra Bevels, MD  levofloxacin (LEVAQUIN) 500 MG tablet Take 500 mg by mouth. Once every 36 hours 03/10/22   [provider]  Multiple Vitamins-Minerals (PRESERVISION AREDS 2 PO) Take 1 tablet by mouth in the morning and at bedtime.    [provider]  [facility] ULTRA test strip  01/19/22   [provider]   oseltamivir (TAMIFLU) 75 MG capsule Take 1 capsule (75 mg total) by mouth every 12 (twelve) hours. 03/05/22   Bethann Berkshire, MD  pravastatin (PRAVACHOL) 20 MG tablet TAKE 1 TABLET DAILY IN THE EVENING 07/19/22   Antoine Poche, MD  traMADol (ULTRAM) 50 MG tablet Take 50 mg by mouth 3 (three) times daily as needed. 11/09/21   [provider]    Physical Exam    Vital Signs:  Miranda Garcia does not have vital signs available for review today.  Given telephonic nature of communication, physical exam is limited. AAOx3. NAD. Normal affect.  Speech and respirations are unlabored.  Accessory Clinical Findings    None  Assessment & Plan    Primary Cardiologist: Dina Rich, MD  Preoperative cardiovascular risk assessment. Right total hip arthroplasty by Dr. Charlann Boxer.  Chart reviewed as part of pre-operative protocol  coverage. According to the RCRI, patient has a 11% risk of MACE. Patient reports activity equivalent to 4.64 METS (Per [facility]).   Given past medical history and time since last visit, based on ACC/AHA guidelines, Miranda Garcia would be at a moderate but acceptable risk for the planned procedure without further cardiovascular testing.   Patient was advised that if she develops new symptoms prior to surgery to contact our office to arrange a follow-up appointment.  she verbalized understanding.  Per pharm D, patient may hold Eliquis 3-4 days prior to procedure.   I will route this recommendation to the requesting party via Epic fax function.  Please call with questions.  Time:   Today, I have spent 6 minutes with the patient with telehealth technology discussing medical history, symptoms, and management plan.     Carlos Levering, NP  09/24/2022, 8:50 AM

## 2022-09-24 NOTE — Progress Notes (Signed)
Please place orders in epic pt. Is scheduled for preop 

## 2022-09-24 NOTE — Patient Instructions (Addendum)
SURGICAL WAITING ROOM VISITATION  Patients having surgery or a procedure may have no more than 2 support people in the waiting area - these visitors may rotate.    Children under the age of 24 must have an adult with them who is not the patient.   If the patient needs to stay at the hospital during part of their recovery, the visitor guidelines for inpatient rooms apply. Pre-op nurse will coordinate an appropriate time for 1 support person to accompany patient in pre-op.  This support person may not rotate.    Please refer to the [facility] website for the visitor guidelines for Inpatients (after your surgery is over and you are in a regular room).       Your procedure is scheduled on: 10-19-22   Report to [facility] Main Entrance    Report to admitting at     0900  AM   Call this number if you have problems the morning of surgery [phone]   Do not eat food :After Midnight.   After Midnight you may have the following liquids until __0830____ AM  DAY OF SURGERY   THEN NOTHING BY MOUTH  Water Non-Citrus Juices (without pulp, NO RED-Apple, White grape, White cranberry) Black Coffee (NO MILK/CREAM OR CREAMERS, sugar ok)  Clear Tea (NO MILK/CREAM OR CREAMERS, sugar ok) regular and decaf                             Plain Jell-O (NO RED)                                           Fruit ices (not with fruit pulp, NO RED)                                     Popsicles (NO RED)                                                               Sports drinks like Gatorade (NO RED)                   The day of surgery:  Drink ONE (1) Pre-Surgery  G2 at       0815   AM the morning of surgery. Drink in one sitting. Do not sip.  This drink was given to you during your hospital  pre-op appointment visit. Nothing else to drink after completing the  Pre-Surgery G2. By 830 am          If you have questions, please contact your surgeon's office.   FOLLOW  ANY ADDITIONAL PRE OP  INSTRUCTIONS YOU RECEIVED FROM YOUR SURGEON'S OFFICE!!!     Oral Hygiene is also important to reduce your risk of infection.                                    Remember - BRUSH YOUR TEETH THE MORNING OF SURGERY WITH YOUR REGULAR TOOTHPASTE  DENTURES WILL  BE REMOVED PRIOR TO SURGERY PLEASE DO NOT APPLY "Poly grip" OR ADHESIVES!!!   Do NOT smoke after Midnight  These are anesthesia recommendations for holding your anticoagulants.  Please contact your prescribing physician to confirm IF it is safe to hold your anticoagulants for this length of time.   Eliquis Apixaban   72 hours   Xarelto Rivaroxaban   72 hours  Plavix Clopidogrel   120 hours  Pletal Cilostazol   120 hours    Take these medicines the morning of surgery with A SIP OF WATER: carvedilol, Diltiazem, eye drops, tylenol if needed  How to Manage Your Diabetes Before and After Surgery  Why is it important to control my blood sugar before and after surgery? Improving blood sugar levels before and after surgery helps healing and can limit problems. A way of improving blood sugar control is eating a healthy diet by:  Eating less sugar and carbohydrates  Increasing activity/exercise  Talking with your doctor about reaching your blood sugar goals High blood sugars (greater than 180 mg/dL) can raise your risk of infections and slow your recovery, so you will need to focus on controlling your diabetes during the weeks before surgery. Make sure that the doctor who takes care of your diabetes knows about your planned surgery including the date and location.  How do I manage my blood sugar before surgery? Check your blood sugar at least 4 times a day, starting 2 days before surgery, to make sure that the level is not too high or low. Check your blood sugar the morning of your surgery when you wake up and every 2 hours until you get to the Short Stay unit. If your blood sugar is less than 70 mg/dL, you will need to treat for low blood  sugar: Do not take insulin. Treat a low blood sugar (less than 70 mg/dL) with  cup of clear juice (cranberry or apple), 4 glucose tablets, OR glucose gel. Recheck blood sugar in 15 minutes after treatment (to make sure it is greater than 70 mg/dL). If your blood sugar is not greater than 70 mg/dL on recheck, call [phone] for further instructions. Report your blood sugar to the short stay nurse when you get to Short Stay.  If you are admitted to the hospital after surgery: Your blood sugar will be checked by the staff and you will probably be given insulin after surgery (instead of oral diabetes medicines) to make sure you have good blood sugar levels. The goal for blood sugar control after surgery is 80-180 mg/dL.   WHAT DO I DO ABOUT MY DIABETES MEDICATION?  Do not take oral diabetes medicines (pills) the morning of surgery.  THE NIGHT BEFORE SURGERY, take 70% of your normal  Novolog 70/30  insulin   dose        THE MORNING OF SURGERY,   DO NOT take  70/30 insulin  DO NOT TAKE THE FOLLOWING 7 DAYS PRIOR TO SURGERY: Ozempic, Wegovy, Rybelsus (Semaglutide), Byetta (exenatide), Bydureon (exenatide ER), Victoza, Saxenda (liraglutide), or Trulicity (dulaglutide) Mounjaro (Tirzepatide) Adlyxin (Lixisenatide), Polyethylene Glycol Loxenatide.   Bring CPAP mask and tubing day of surgery.                              You may not have any metal on your body including hair pins, jewelry, and body piercing             Do not wear make-up,  lotions, powders, perfumes/cologne, or deodorant  Do not wear nail polish including gel and S&S, artificial/acrylic nails, or any other type of covering on natural nails including finger and toenails. If you have artificial nails, gel coating, etc. that needs to be removed by a nail salon please have this removed prior to surgery or surgery may need to be canceled/ delayed if the surgeon/ anesthesia feels like they are unable to be safely monitored.   Do  not shave  48 hours prior to surgery.               Do not bring valuables to the hospital. [facility] IS NOT             RESPONSIBLE   FOR VALUABLES.   Contacts, glasses, dentures or bridgework may not be worn into surgery.   Bring small overnight bag day of surgery.   DO NOT BRING YOUR HOME MEDICATIONS TO THE HOSPITAL. PHARMACY WILL DISPENSE MEDICATIONS LISTED ON YOUR MEDICATION LIST TO YOU DURING YOUR ADMISSION IN THE HOSPITAL!    Patients discharged on the day of surgery will not be allowed to drive home.  Someone NEEDS to stay with you for the first 24 hours after anesthesia.   Special Instructions: Bring a copy of your healthcare power of attorney and living will documents the day of surgery if you haven't scanned them before.              Please read over the following fact sheets you were given: IF YOU HAVE QUESTIONS ABOUT YOUR PRE-OP INSTRUCTIONS PLEASE CALL [phone]    If you test positive for Covid or have been in contact with anyone that has tested positive in the last 10 days please notify you surgeon.      Pre-operative 5 CHG Bath Instructions   You can play a key role in reducing the risk of infection after surgery. Your skin needs to be as free of germs as possible. You can reduce the number of germs on your skin by washing with CHG (chlorhexidine gluconate) soap before surgery. CHG is an antiseptic soap that kills germs and continues to kill germs even after washing.   DO NOT use if you have an allergy to chlorhexidine/CHG or antibacterial soaps. If your skin becomes reddened or irritated, stop using the CHG and notify one of our RNs at [phone].   Please shower with the CHG soap starting 4 days before surgery using the following schedule:     Please keep in mind the following:  DO NOT shave, including legs and underarms, starting the day of your first shower.   You may shave your face at any point before/day of surgery.  Place clean sheets on your bed  the day you start using CHG soap. Use a clean washcloth (not used since being washed) for each shower. DO NOT sleep with pets once you start using the CHG.   CHG Shower Instructions:  If you choose to wash your hair and private area, wash first with your normal shampoo/soap.  After you use shampoo/soap, rinse your hair and body thoroughly to remove shampoo/soap residue.  Turn the water OFF and apply about 3 tablespoons (45 ml) of CHG soap to a CLEAN washcloth.  Apply CHG soap ONLY FROM YOUR NECK DOWN TO YOUR TOES (washing for 3-5 minutes)  DO NOT use CHG soap on face, private areas, open wounds, or sores.  Pay special attention to the area where your surgery is being performed.  If  you are having back surgery, having someone wash your back for you may be helpful. Wait 2 minutes after CHG soap is applied, then you may rinse off the CHG soap.  Pat dry with a clean towel  Put on clean clothes/pajamas   If you choose to wear lotion, please use ONLY the CHG-compatible lotions on the back of this paper.     Additional instructions for the day of surgery: DO NOT APPLY any lotions, deodorants, cologne, or perfumes.   Put on clean/comfortable clothes.  Brush your teeth.  Ask your nurse before applying any prescription medications to the skin.      CHG Compatible Lotions   Aveeno Moisturizing lotion  Cetaphil Moisturizing Cream  Cetaphil Moisturizing Lotion  Clairol Herbal Essence Moisturizing Lotion, Dry Skin  Clairol Herbal Essence Moisturizing Lotion, Extra Dry Skin  Clairol Herbal Essence Moisturizing Lotion, Normal Skin  Curel Age Defying Therapeutic Moisturizing Lotion with Alpha Hydroxy  Curel Extreme Care Body Lotion  Curel Soothing Hands Moisturizing Hand Lotion  Curel Therapeutic Moisturizing Cream, Fragrance-Free  Curel Therapeutic Moisturizing Lotion, Fragrance-Free  Curel Therapeutic Moisturizing Lotion, Original Formula  Eucerin Daily Replenishing Lotion  Eucerin Dry  Skin Therapy Plus Alpha Hydroxy Crme  Eucerin Dry Skin Therapy Plus Alpha Hydroxy Lotion  Eucerin Original Crme  Eucerin Original Lotion  Eucerin Plus Crme Eucerin Plus Lotion  Eucerin TriLipid Replenishing Lotion  Keri Anti-Bacterial Hand Lotion  Keri Deep Conditioning Original Lotion Dry Skin Formula Softly Scented  Keri Deep Conditioning Original Lotion, Fragrance Free Sensitive Skin Formula  Keri Lotion Fast Absorbing Fragrance Free Sensitive Skin Formula  Keri Lotion Fast Absorbing Softly Scented Dry Skin Formula  Keri Original Lotion  Keri Skin Renewal Lotion Keri Silky Smooth Lotion  Keri Silky Smooth Sensitive Skin Lotion  Nivea Body Creamy Conditioning Oil  Nivea Body Extra Enriched Teacher, adult education Moisturizing Lotion Nivea Crme  Nivea Skin Firming Lotion  NutraDerm 30 Skin Lotion  NutraDerm Skin Lotion  NutraDerm Therapeutic Skin Cream  NutraDerm Therapeutic Skin Lotion  ProShield Protective Hand Cream  WHAT IS A BLOOD TRANSFUSION? Blood Transfusion Information  A transfusion is the replacement of blood or some of its parts. Blood is made up of multiple cells which provide different functions. Red blood cells carry oxygen and are used for blood loss replacement. White blood cells fight against infection. Platelets control bleeding. Plasma helps clot blood. Other blood products are available for specialized needs, such as hemophilia or other clotting disorders. BEFORE THE TRANSFUSION  Who gives blood for transfusions?  Healthy volunteers who are fully evaluated to make sure their blood is safe. This is blood bank blood. Transfusion therapy is the safest it has ever been in the practice of medicine. Before blood is taken from a donor, a complete history is taken to make sure that person has no history of diseases nor engages in risky social behavior (examples are intravenous drug use or sexual activity with multiple partners). The  donor's travel history is screened to minimize risk of transmitting infections, such as malaria. The donated blood is tested for signs of infectious diseases, such as HIV and hepatitis. The blood is then tested to be sure it is compatible with you in order to minimize the chance of a transfusion reaction. If you or a relative donates blood, this is often done in anticipation of surgery and is not appropriate for emergency situations. It takes many days to process the donated blood. RISKS AND COMPLICATIONS  Although transfusion therapy is very safe and saves many lives, the main dangers of transfusion include:  Getting an infectious disease. Developing a transfusion reaction. This is an allergic reaction to something in the blood you were given. Every precaution is taken to prevent this. The decision to have a blood transfusion has been considered carefully by your caregiver before blood is given. Blood is not given unless the benefits outweigh the risks. AFTER THE TRANSFUSION Right after receiving a blood transfusion, you will usually feel much better and more energetic. This is especially true if your red blood cells have gotten low (anemic). The transfusion raises the level of the red blood cells which carry oxygen, and this usually causes an energy increase. The nurse administering the transfusion will monitor you carefully for complications. HOME CARE INSTRUCTIONS  No special instructions are needed after a transfusion. You may find your energy is better. Speak with your caregiver about any limitations on activity for underlying diseases you may have. SEEK MEDICAL CARE IF:  Your condition is not improving after your transfusion. You develop redness or irritation at the intravenous (IV) site. SEEK IMMEDIATE MEDICAL CARE IF:  Any of the following symptoms occur over the next 12 hours: Shaking chills. You have a temperature by mouth above 102 F (38.9 C), not controlled by medicine. Chest, back,  or muscle pain. People around you feel you are not acting correctly or are confused. Shortness of breath or difficulty breathing. Dizziness and fainting. You get a rash or develop hives. You have a decrease in urine output. Your urine turns a dark color or changes to pink, red, or brown. Any of the following symptoms occur over the next 10 days: You have a temperature by mouth above 102 F (38.9 C), not controlled by medicine. Shortness of breath. Weakness after normal activity. The white part of the eye turns yellow (jaundice). You have a decrease in the amount of urine or are urinating less often. Your urine turns a dark color or changes to pink, red, or brown. Document Released: 03/05/2000 Document Revised: 05/31/2011 Document Reviewed: 10/23/2007 [employer] Patient Information 2014 [place], [place].  _______________________________________________________________________  Incentive Spirometer  An incentive spirometer is a tool that can help keep your lungs clear and active. This tool measures how well you are filling your lungs with each breath. Taking long deep breaths may help reverse or decrease the chance of developing breathing (pulmonary) problems (especially infection) following: A long period of time when you are unable to move or be active. BEFORE THE PROCEDURE  If the spirometer includes an indicator to show your best effort, your nurse or respiratory therapist will set it to a desired goal. If possible, sit up straight or lean slightly forward. Try not to slouch. Hold the incentive spirometer in an upright position. INSTRUCTIONS FOR USE  Sit on the edge of your bed if possible, or sit up as far as you can in bed or on a chair. Hold the incentive spirometer in an upright position. Breathe out normally. Place the mouthpiece in your mouth and seal your lips tightly around it. Breathe in slowly and as deeply as possible, raising the piston or the ball toward the top of the  column. Hold your breath for 3-5 seconds or for as long as possible. Allow the piston or ball to fall to the bottom of the column. Remove the mouthpiece from your mouth and breathe out normally. Rest for a few seconds and repeat Steps 1 through 7 at least 10  times every 1-2 hours when you are awake. Take your time and take a few normal breaths between deep breaths. The spirometer may include an indicator to show your best effort. Use the indicator as a goal to work toward during each repetition. After each set of 10 deep breaths, practice coughing to be sure your lungs are clear. If you have an incision (the cut made at the time of surgery), support your incision when coughing by placing a pillow or rolled up towels firmly against it. Once you are able to get out of bed, walk around indoors and cough well. You may stop using the incentive spirometer when instructed by your caregiver.  RISKS AND COMPLICATIONS Take your time so you do not get dizzy or light-headed. If you are in pain, you may need to take or ask for pain medication before doing incentive spirometry. It is harder to take a deep breath if you are having pain. AFTER USE Rest and breathe slowly and easily. It can be helpful to keep track of a log of your progress. Your caregiver can provide you with a simple table to help with this. If you are using the spirometer at home, follow these instructions: SEEK MEDICAL CARE IF:  You are having difficultly using the spirometer. You have trouble using the spirometer as often as instructed. Your pain medication is not giving enough relief while using the spirometer. You develop fever of 100.5 F (38.1 C) or higher. SEEK IMMEDIATE MEDICAL CARE IF:  You cough up bloody sputum that had not been present before. You develop fever of 102 F (38.9 C) or greater. You develop worsening pain at or near the incision site. MAKE SURE YOU:  Understand these instructions. Will watch your  condition. Will get help right away if you are not doing well or get worse. Document Released: 07/19/2006 Document Revised: 05/31/2011 Document Reviewed: 09/19/2006 [facility] Patient Information 2014 [place], [place].   ________________________________________________________________________

## 2022-09-28 ENCOUNTER — Other Ambulatory Visit: Payer: Self-pay | Admitting: Cardiology

## 2022-09-28 DIAGNOSIS — I482 Chronic atrial fibrillation, unspecified: Secondary | ICD-10-CM

## 2022-09-28 NOTE — Telephone Encounter (Signed)
Prescription refill request for Eliquis received. Indication: AF Last office visit: 03/17/22  Dominga Ferry MD Scr: 2.43 on 10/30/21  Epic Age: 89 Weight: 76.2kg  Based on above findings Eliquis 2.5mg  twice daily is the appropriate dose.  Refill approved.

## 2022-09-30 DIAGNOSIS — H35033 Hypertensive retinopathy, bilateral: Secondary | ICD-10-CM | POA: Diagnosis not present

## 2022-09-30 DIAGNOSIS — Z0100 Encounter for examination of eyes and vision without abnormal findings: Secondary | ICD-10-CM | POA: Diagnosis not present

## 2022-09-30 DIAGNOSIS — H5203 Hypermetropia, bilateral: Secondary | ICD-10-CM | POA: Diagnosis not present

## 2022-10-08 ENCOUNTER — Encounter (HOSPITAL_COMMUNITY)
Admission: RE | Admit: 2022-10-08 | Discharge: 2022-10-08 | Disposition: A | Discharge status: Home / Self Care | Payer: Medicare HMO | Source: Ambulatory Visit | Attending: Anesthesiology | Admitting: Anesthesiology

## 2022-10-08 DIAGNOSIS — I700 Atherosclerosis of aorta: Secondary | ICD-10-CM

## 2022-10-08 DIAGNOSIS — Z01818 Encounter for other preprocedural examination: Secondary | ICD-10-CM

## 2022-10-08 DIAGNOSIS — E119 Type 2 diabetes mellitus without complications: Secondary | ICD-10-CM

## 2022-10-14 ENCOUNTER — Encounter (HOSPITAL_COMMUNITY)
Admission: RE | Admit: 2022-10-14 | Discharge: 2022-10-14 | Disposition: A | Discharge status: Home / Self Care | Payer: Medicare HMO | Source: Ambulatory Visit | Attending: Orthopedic Surgery | Admitting: Orthopedic Surgery

## 2022-10-14 ENCOUNTER — Other Ambulatory Visit: Payer: Self-pay

## 2022-10-14 ENCOUNTER — Encounter (HOSPITAL_COMMUNITY): Payer: Self-pay

## 2022-10-14 DIAGNOSIS — I700 Atherosclerosis of aorta: Secondary | ICD-10-CM | POA: Diagnosis not present

## 2022-10-14 DIAGNOSIS — M1611 Unilateral primary osteoarthritis, right hip: Secondary | ICD-10-CM | POA: Diagnosis not present

## 2022-10-14 DIAGNOSIS — I130 Hypertensive heart and chronic kidney disease with heart failure and stage 1 through stage 4 chronic kidney disease, or unspecified chronic kidney disease: Secondary | ICD-10-CM | POA: Insufficient documentation

## 2022-10-14 DIAGNOSIS — I4891 Unspecified atrial fibrillation: Secondary | ICD-10-CM | POA: Insufficient documentation

## 2022-10-14 DIAGNOSIS — Z01812 Encounter for preprocedural laboratory examination: Secondary | ICD-10-CM | POA: Diagnosis not present

## 2022-10-14 DIAGNOSIS — E119 Type 2 diabetes mellitus without complications: Secondary | ICD-10-CM

## 2022-10-14 DIAGNOSIS — N184 Chronic kidney disease, stage 4 (severe): Secondary | ICD-10-CM | POA: Insufficient documentation

## 2022-10-14 DIAGNOSIS — Z01818 Encounter for other preprocedural examination: Secondary | ICD-10-CM | POA: Diagnosis not present

## 2022-10-14 DIAGNOSIS — M5451 Vertebrogenic low back pain: Secondary | ICD-10-CM | POA: Diagnosis not present

## 2022-10-14 DIAGNOSIS — E1122 Type 2 diabetes mellitus with diabetic chronic kidney disease: Secondary | ICD-10-CM | POA: Diagnosis not present

## 2022-10-14 DIAGNOSIS — Z01810 Encounter for preprocedural cardiovascular examination: Secondary | ICD-10-CM | POA: Diagnosis not present

## 2022-10-14 DIAGNOSIS — I509 Heart failure, unspecified: Secondary | ICD-10-CM | POA: Diagnosis not present

## 2022-10-14 DIAGNOSIS — M5136 Other intervertebral disc degeneration, lumbar region: Secondary | ICD-10-CM | POA: Diagnosis not present

## 2022-10-14 DIAGNOSIS — M5416 Radiculopathy, lumbar region: Secondary | ICD-10-CM | POA: Diagnosis not present

## 2022-10-14 DIAGNOSIS — Z96649 Presence of unspecified artificial hip joint: Secondary | ICD-10-CM | POA: Diagnosis not present

## 2022-10-14 LAB — CBC
HCT: 39.7 % (ref 36.0–46.0)
Hemoglobin: 12.4 g/dL (ref 12.0–15.0)
MCH: 30.2 pg (ref 26.0–34.0)
MCHC: 31.2 g/dL (ref 30.0–36.0)
MCV: 96.8 fL (ref 80.0–100.0)
Platelets: 167 10*3/uL (ref 150–400)
RBC: 4.1 MIL/uL (ref 3.87–5.11)
RDW: 13.2 % (ref 11.5–15.5)
WBC: 6.2 10*3/uL (ref 4.0–10.5)
nRBC: 0 % (ref 0.0–0.2)

## 2022-10-14 LAB — SURGICAL PCR SCREEN
MRSA, PCR: NEGATIVE
Staphylococcus aureus: NEGATIVE

## 2022-10-14 LAB — BASIC METABOLIC PANEL
Anion gap: 13 (ref 5–15)
BUN: 56 mg/dL — ABNORMAL HIGH (ref 8–23)
CO2: 29 mmol/L (ref 22–32)
Calcium: 9.6 mg/dL (ref 8.9–10.3)
Chloride: 96 mmol/L — ABNORMAL LOW (ref 98–111)
Creatinine, Ser: 2.76 mg/dL — ABNORMAL HIGH (ref 0.44–1.00)
GFR, Estimated: 16 mL/min — ABNORMAL LOW (ref >60)
Glucose, Bld: 141 mg/dL — ABNORMAL HIGH (ref 70–99)
Potassium: 3.8 mmol/L (ref 3.5–5.1)
Sodium: 138 mmol/L (ref 135–145)

## 2022-10-14 LAB — GLUCOSE, CAPILLARY: Glucose-Capillary: 136 mg/dL — ABNORMAL HIGH (ref 70–99)

## 2022-10-14 LAB — TYPE AND SCREEN
ABO/RH(D): O POS
Antibody Screen: NEGATIVE

## 2022-10-14 NOTE — Patient Instructions (Signed)
DUE TO COVID-19 ONLY TWO VISITORS  (aged 87 and older)  ARE ALLOWED TO COME WITH YOU AND STAY IN THE WAITING ROOM ONLY DURING PRE OP AND PROCEDURE.   **NO VISITORS ARE ALLOWED IN THE SHORT STAY AREA OR RECOVERY ROOM!!**  IF YOU WILL BE ADMITTED INTO THE HOSPITAL YOU ARE ALLOWED ONLY FOUR SUPPORT PEOPLE DURING VISITATION HOURS ONLY (7 AM -8PM)   The support person(s) must pass our screening, gel in and out, and wear a mask at all times, including in the patient's room. Patients must also wear a mask when staff or their support person are in the room. Visitors GUEST BADGE MUST BE WORN VISIBLY  One adult visitor may remain with you overnight and MUST be in the room by 8 P.M.     Your procedure is scheduled on: 10/19/22   Report to [facility] Main Entrance    Report to admitting at : 9:00 AM   Call this number if you have problems the morning of surgery [phone]   Do not eat food :After Midnight.   After Midnight you may have the following liquids until: 8:30 AM DAY OF SURGERY  Water Black Coffee (sugar ok, NO MILK/CREAM OR CREAMERS)  Tea (sugar ok, NO MILK/CREAM OR CREAMERS) regular and decaf                             Plain Jell-O (NO RED)                                           Fruit ices (not with fruit pulp, NO RED)                                     Popsicles (NO RED)                                                                  Juice: apple, WHITE grape, WHITE cranberry Sports drinks like Gatorade (NO RED)   The day of surgery:  Drink ONE (1) Pre-Surgery Clear G2 at: 8:30 AM the morning of surgery. Drink in one sitting. Do not sip.  This drink was given to you during your hospital  pre-op appointment visit. Nothing else to drink after completing the  Pre-Surgery Clear Ensure or G2.          If you have questions, please contact your surgeon's office.   Oral Hygiene is also important to reduce your risk of infection.                                     Remember - BRUSH YOUR TEETH THE MORNING OF SURGERY WITH YOUR REGULAR TOOTHPASTE  DENTURES WILL BE REMOVED PRIOR TO SURGERY PLEASE DO NOT APPLY "Poly grip" OR ADHESIVES!!!   Do NOT smoke after Midnight   Take these medicines the morning of surgery with A SIP OF WATER: carvedilol,diltiazem.Use eye drops as usual.Tylenol as needed.  How to  Manage Your Diabetes Before and After Surgery  Why is it important to control my blood sugar before and after surgery? Improving blood sugar levels before and after surgery helps healing and can limit problems. A way of improving blood sugar control is eating a healthy diet by:  Eating less sugar and carbohydrates  Increasing activity/exercise  Talking with your doctor about reaching your blood sugar goals High blood sugars (greater than 180 mg/dL) can raise your risk of infections and slow your recovery, so you will need to focus on controlling your diabetes during the weeks before surgery. Make sure that the doctor who takes care of your diabetes knows about your planned surgery including the date and location.  How do I manage my blood sugar before surgery? Check your blood sugar at least 4 times a day, starting 2 days before surgery, to make sure that the level is not too high or low. Check your blood sugar the morning of your surgery when you wake up and every 2 hours until you get to the Short Stay unit. If your blood sugar is less than 70 mg/dL, you will need to treat for low blood sugar: Do not take insulin. Treat a low blood sugar (less than 70 mg/dL) with  cup of clear juice (cranberry or apple), 4 glucose tablets, OR glucose gel. Recheck blood sugar in 15 minutes after treatment (to make sure it is greater than 70 mg/dL). If your blood sugar is not greater than 70 mg/dL on recheck, call [phone] for further instructions. Report your blood sugar to the short stay nurse when you get to Short Stay.  If you are admitted to the hospital after  surgery: Your blood sugar will be checked by the staff and you will probably be given insulin after surgery (instead of oral diabetes medicines) to make sure you have good blood sugar levels. The goal for blood sugar control after surgery is 80-180 mg/dL.   WHAT DO I DO ABOUT MY DIABETES MEDICATION?  Do not take oral diabetes medicines (pills) the morning of surgery.  THE NIGHT BEFORE SURGERY, take ONLY 70% of novolog 70/30 insulin (8.4 to 11.2 units).   THE MORNING OF SURGERY, take   units of         insulin. DO NOT TAKE ANY Novolog 70/30 insilin DAY OF YOUR SURGERY  Bring CPAP mask and tubing day of surgery.                              You may not have any metal on your body including hair pins, jewelry, and body piercing             Do not wear make-up, lotions, powders, perfumes/cologne, or deodorant  Do not wear nail polish including gel and S&S, artificial/acrylic nails, or any other type of covering on natural nails including finger and toenails. If you have artificial nails, gel coating, etc. that needs to be removed by a nail salon please have this removed prior to surgery or surgery may need to be canceled/ delayed if the surgeon/ anesthesia feels like they are unable to be safely monitored.   Do not shave  48 hours prior to surgery.               Men may shave face and neck.   Do not bring valuables to the hospital. [facility] IS NOT  RESPONSIBLE   FOR VALUABLES.   Contacts, glasses, or bridgework may not be worn into surgery.   Bring small overnight bag day of surgery.   DO NOT BRING YOUR HOME MEDICATIONS TO THE HOSPITAL. PHARMACY WILL DISPENSE MEDICATIONS LISTED ON YOUR MEDICATION LIST TO YOU DURING YOUR ADMISSION IN THE HOSPITAL!    Patients discharged on the day of surgery will not be allowed to drive home.  Someone NEEDS to stay with you for the first 24 hours after anesthesia.   Special Instructions: Bring a copy of your healthcare power of attorney  and living will documents         the day of surgery if you haven't scanned them before.              Please read over the following fact sheets you were given: IF YOU HAVE QUESTIONS ABOUT YOUR PRE-OP INSTRUCTIONS PLEASE CALL [phone]      Pre-operative 5 CHG Bath Instructions   You can play a key role in reducing the risk of infection after surgery. Your skin needs to be as free of germs as possible. You can reduce the number of germs on your skin by washing with CHG (chlorhexidine gluconate) soap before surgery. CHG is an antiseptic soap that kills germs and continues to kill germs even after washing.   DO NOT use if you have an allergy to chlorhexidine/CHG or antibacterial soaps. If your skin becomes reddened or irritated, stop using the CHG and notify one of our RNs at [phone].   Please shower with the CHG soap starting 4 days before surgery using the following schedule:     Please keep in mind the following:  DO NOT shave, including legs and underarms, starting the day of your first shower.   You may shave your face at any point before/day of surgery.  Place clean sheets on your bed the day you start using CHG soap. Use a clean washcloth (not used since being washed) for each shower. DO NOT sleep with pets once you start using the CHG.   CHG Shower Instructions:  If you choose to wash your hair and private area, wash first with your normal shampoo/soap.  After you use shampoo/soap, rinse your hair and body thoroughly to remove shampoo/soap residue.  Turn the water OFF and apply about 3 tablespoons (45 ml) of CHG soap to a CLEAN washcloth.  Apply CHG soap ONLY FROM YOUR NECK DOWN TO YOUR TOES (washing for 3-5 minutes)  DO NOT use CHG soap on face, private areas, open wounds, or sores.  Pay special attention to the area where your surgery is being performed.  If you are having back surgery, having someone wash your back for you may be helpful. Wait 2 minutes after CHG soap  is applied, then you may rinse off the CHG soap.  Pat dry with a clean towel  Put on clean clothes/pajamas   If you choose to wear lotion, please use ONLY the CHG-compatible lotions on the back of this paper.     Additional instructions for the day of surgery: DO NOT APPLY any lotions, deodorants, cologne, or perfumes.   Put on clean/comfortable clothes.  Brush your teeth.  Ask your nurse before applying any prescription medications to the skin.      CHG Compatible Lotions   Aveeno Moisturizing lotion  Cetaphil Moisturizing Cream  Cetaphil Moisturizing Lotion  Clairol Herbal Essence Moisturizing Lotion, Dry Skin  Clairol Herbal Essence Moisturizing Lotion, Extra Dry Skin  Clairol Herbal Essence Moisturizing Lotion, Normal Skin  Curel Age Defying Therapeutic Moisturizing Lotion with Alpha Hydroxy  Curel Extreme Care Body Lotion  Curel Soothing Hands Moisturizing Hand Lotion  Curel Therapeutic Moisturizing Cream, Fragrance-Free  Curel Therapeutic Moisturizing Lotion, Fragrance-Free  Curel Therapeutic Moisturizing Lotion, Original Formula  Eucerin Daily Replenishing Lotion  Eucerin Dry Skin Therapy Plus Alpha Hydroxy Crme  Eucerin Dry Skin Therapy Plus Alpha Hydroxy Lotion  Eucerin Original Crme  Eucerin Original Lotion  Eucerin Plus Crme Eucerin Plus Lotion  Eucerin TriLipid Replenishing Lotion  Keri Anti-Bacterial Hand Lotion  Keri Deep Conditioning Original Lotion Dry Skin Formula Softly Scented  Keri Deep Conditioning Original Lotion, Fragrance Free Sensitive Skin Formula  Keri Lotion Fast Absorbing Fragrance Free Sensitive Skin Formula  Keri Lotion Fast Absorbing Softly Scented Dry Skin Formula  Keri Original Lotion  Keri Skin Renewal Lotion Keri Silky Smooth Lotion  Keri Silky Smooth Sensitive Skin Lotion  Nivea Body Creamy Conditioning Oil  Nivea Body Extra Enriched Lotion  Nivea Body Original Lotion  Nivea Body Sheer Moisturizing Lotion Nivea Crme  Nivea Skin  Firming Lotion  NutraDerm 30 Skin Lotion  NutraDerm Skin Lotion  NutraDerm Therapeutic Skin Cream  NutraDerm Therapeutic Skin Lotion  ProShield Protective Hand Cream  Provon moisturizing lotion   Incentive Spirometer  An incentive spirometer is a tool that can help keep your lungs clear and active. This tool measures how well you are filling your lungs with each breath. Taking long deep breaths may help reverse or decrease the chance of developing breathing (pulmonary) problems (especially infection) following: A long period of time when you are unable to move or be active. BEFORE THE PROCEDURE  If the spirometer includes an indicator to show your best effort, your nurse or respiratory therapist will set it to a desired goal. If possible, sit up straight or lean slightly forward. Try not to slouch. Hold the incentive spirometer in an upright position. INSTRUCTIONS FOR USE  Sit on the edge of your bed if possible, or sit up as far as you can in bed or on a chair. Hold the incentive spirometer in an upright position. Breathe out normally. Place the mouthpiece in your mouth and seal your lips tightly around it. Breathe in slowly and as deeply as possible, raising the piston or the ball toward the top of the column. Hold your breath for 3-5 seconds or for as long as possible. Allow the piston or ball to fall to the bottom of the column. Remove the mouthpiece from your mouth and breathe out normally. Rest for a few seconds and repeat Steps 1 through 7 at least 10 times every 1-2 hours when you are awake. Take your time and take a few normal breaths between deep breaths. The spirometer may include an indicator to show your best effort. Use the indicator as a goal to work toward during each repetition. After each set of 10 deep breaths, practice coughing to be sure your lungs are clear. If you have an incision (the cut made at the time of surgery), support your incision when coughing by placing a  pillow or rolled up towels firmly against it. Once you are able to get out of bed, walk around indoors and cough well. You may stop using the incentive spirometer when instructed by your caregiver.  RISKS AND COMPLICATIONS Take your time so you do not get dizzy or light-headed. If you are in pain, you may need to take or ask for pain medication before doing  incentive spirometry. It is harder to take a deep breath if you are having pain. AFTER USE Rest and breathe slowly and easily. It can be helpful to keep track of a log of your progress. Your caregiver can provide you with a simple table to help with this. If you are using the spirometer at home, follow these instructions: SEEK MEDICAL CARE IF:  You are having difficultly using the spirometer. You have trouble using the spirometer as often as instructed. Your pain medication is not giving enough relief while using the spirometer. You develop fever of 100.5 F (38.1 C) or higher. SEEK IMMEDIATE MEDICAL CARE IF:  You cough up bloody sputum that had not been present before. You develop fever of 102 F (38.9 C) or greater. You develop worsening pain at or near the incision site. MAKE SURE YOU:  Understand these instructions. Will watch your condition. Will get help right away if you are not doing well or get worse. Document Released: 07/19/2006 Document Revised: 05/31/2011 Document Reviewed: 09/19/2006 [facility] Patient Information 2014 [place], [place].   ________________________________________________________________________

## 2022-10-14 NOTE — Progress Notes (Signed)
PCP: Ignatius Specking, MD  EKG: 10/14/22 Eliquis: last dose will be: 10/15/22 CBG's x 2 a dayy: 120's - 140's.

## 2022-10-15 NOTE — Anesthesia Preprocedure Evaluation (Signed)
Anesthesia Evaluation  Patient identified by MRN, date of birth, ID band Patient awake    Reviewed: Allergy & Precautions, H&P , NPO status , Patient's Chart, lab work & pertinent test results  Airway Mallampati: II  TM Distance: >3 FB Neck ROM: Full    Dental no notable dental hx.    Pulmonary neg pulmonary ROS   Pulmonary exam normal breath sounds clear to auscultation       Cardiovascular hypertension, + dysrhythmias Atrial Fibrillation + Valvular Problems/Murmurs AS  Rhythm:Irregular Rate:Normal + Systolic murmurs 1. Left ventricular ejection fraction, by estimation, is 65 to 70%. The  left ventricle has normal function. The left ventricle has no regional  wall motion abnormalities. There is moderate concentric left ventricular  hypertrophy. Left ventricular  diastolic parameters are indeterminate.   2. Right ventricular systolic function is normal. The right ventricular  size is normal. There is mildly elevated pulmonary artery systolic  pressure. The estimated right ventricular systolic pressure is 38.0 mmHg.   3. Left atrial size was severely dilated.   4. A small to moderate pericardial effusion is present. The pericardial  effusion is posterior to the left ventricle.   5. The mitral valve is abnormal. Mild mitral valve regurgitation. Severe  mitral annular calcification.   6. The aortic valve is tricuspid. There is moderate calcification of the  aortic valve. Aortic valve regurgitation is not visualized. Moderate  aortic valve stenosis. Aortic valve area, by VTI measures 1.26 cm. Aortic  valve mean gradient measures 11.8  mmHg. Aortic valve Vmax measures 2.23 m/s. Dimentionless index 0.45.   7. The inferior vena cava is normal in size with greater than 50%  respiratory variability, suggesting right atrial pressure of 3 mmHg.       Neuro/Psych negative neurological ROS  negative psych ROS   GI/Hepatic negative GI  ROS, Neg liver ROS,,,  Endo/Other  diabetes, Insulin Dependent    Renal/GU Renal InsufficiencyRenal disease  negative genitourinary   Musculoskeletal negative musculoskeletal ROS (+)    Abdominal   Peds negative pediatric ROS (+)  Hematology negative hematology ROS (+)   Anesthesia Other Findings   Reproductive/Obstetrics negative OB ROS                             Anesthesia Physical Anesthesia Plan  ASA: 4  Anesthesia Plan: General   Post-op Pain Management: Minimal or no pain anticipated and Regional block*   Induction: Intravenous  PONV Risk Score and Plan: 2 and Ondansetron, Propofol infusion and Treatment may vary due to age or medical condition  Airway Management Planned: LMA  Additional Equipment:   Intra-op Plan:   Post-operative Plan: Extubation in OR  Informed Consent: I have reviewed the patients History and Physical, chart, labs and discussed the procedure including the risks, benefits and alternatives for the proposed anesthesia with the patient or authorized representative who has indicated his/her understanding and acceptance.     Dental advisory given  Plan Discussed with: CRNA and Surgeon  Anesthesia Plan Comments: (See PAT note 10/14/2022)       Anesthesia Quick Evaluation

## 2022-10-15 NOTE — Progress Notes (Signed)
Lab results: Creatinine: 2.76

## 2022-10-15 NOTE — Progress Notes (Signed)
Anesthesia Chart Review   Case: [id number] Date/Time: 10/19/22 1115   Procedure: TOTAL HIP ARTHROPLASTY ANTERIOR APPROACH (Right: Hip)   Anesthesia type: Spinal   Pre-op diagnosis: Right hip osteoarthritis   Location: [facility] ROOM 10 / [facility]   Surgeons: Durene Romans, MD       DISCUSSION:89 y.o. never smoker with h/o HTN, CHF, atrial fibrillation, moderate AV stenosis, DM II, CKD Stage IV, right hip OA scheduled for above procedure 10/19/2022 with Dr. Durene Romans.   Pt last seen by cardiology 09/24/2022. Per OV note, "Preoperative cardiovascular risk assessment. Right total hip arthroplasty by Dr. Charlann Boxer.  Chart reviewed as part of pre-operative protocol coverage. According to the RCRI, patient has a 11% risk of MACE. Patient reports activity equivalent to 4.64 METS (Per [facility]).  Given past medical history and time since last visit, based on ACC/AHA guidelines, TAKERRIA PUMA would be at a moderate but acceptable risk for the planned procedure without further cardiovascular testing.  Patient was advised that if she develops new symptoms prior to surgery to contact our office to arrange a follow-up appointment.  she verbalized understanding."  Last dose of Eliquis 10/15/2022.   Discussed with Dr. Krista Blue.Anticipate pt can proceed with planned procedure barring acute status change.    VS: BP (!) 155/75   Pulse 62   Temp 36.4 C (Oral)   Ht 5\' 3"  (1.6 m)   Wt 77.6 kg   SpO2 100%   BMI 30.29 kg/m   PROVIDERS: Ignatius Specking, MD is PCP   Primary Cardiologist:  Dina Rich, MD  LABS: Labs reviewed: Acceptable for surgery. (all labs ordered are listed, but only abnormal results are displayed)  Labs Reviewed  HEMOGLOBIN A1C - Abnormal; Notable for the following components:      Result Value   Hgb A1c MFr Bld 6.0 (*)    All other components within normal limits  BASIC METABOLIC PANEL - Abnormal; Notable for the following components:   Chloride 96 (*)    Glucose, Bld 141 (*)    BUN 56  (*)    Creatinine, Ser 2.76 (*)    GFR, Estimated 16 (*)    All other components within normal limits  GLUCOSE, CAPILLARY - Abnormal; Notable for the following components:   Glucose-Capillary 136 (*)    All other components within normal limits  SURGICAL PCR SCREEN  CBC  TYPE AND SCREEN     IMAGES:   EKG:   CV: Echo 09/21/2021 1. Left ventricular ejection fraction, by estimation, is 65 to 70%. The  left ventricle has normal function. The left ventricle has no regional  wall motion abnormalities. There is moderate concentric left ventricular  hypertrophy. Left ventricular  diastolic parameters are indeterminate.   2. Right ventricular systolic function is normal. The right ventricular  size is normal. There is mildly elevated pulmonary artery systolic  pressure. The estimated right ventricular systolic pressure is 38.0 mmHg.   3. Left atrial size was severely dilated.   4. A small to moderate pericardial effusion is present. The pericardial  effusion is posterior to the left ventricle.   5. The mitral valve is abnormal. Mild mitral valve regurgitation. Severe  mitral annular calcification.   6. The aortic valve is tricuspid. There is moderate calcification of the  aortic valve. Aortic valve regurgitation is not visualized. Moderate  aortic valve stenosis. Aortic valve area, by VTI measures 1.26 cm. Aortic  valve mean gradient measures 11.8  mmHg. Aortic valve Vmax measures 2.23 m/s.  Dimentionless index 0.45.   7. The inferior vena cava is normal in size with greater than 50%  respiratory variability, suggesting right atrial pressure of 3 mmHg.  Past Medical History:  Diagnosis Date   Arthritis    back, knees   Atrial fibrillation (HCC)    CHF (congestive heart failure) (HCC) 08/2018   Diabetes mellitus type II, controlled (HCC)    x 15 yrs   Dysrhythmia    A-fib   Elevated troponin    Hemorrhoids    History of kidney stones    Hypertension    Neuromuscular  disorder (HCC)    numbness in hands    Renal insufficiency    stage iv    Past Surgical History:  Procedure Laterality Date   BACK SURGERY  1997   [facility]   CATARACT EXTRACTION W/PHACO  01/11/2011   Procedure: CATARACT EXTRACTION PHACO AND INTRAOCULAR LENS PLACEMENT (IOC);  Surgeon: Gemma Payor;  Location: [facility];  Service: Ophthalmology;  Laterality: Right;  CDE 19.32   CATARACT EXTRACTION W/PHACO  01/21/2011   Procedure: CATARACT EXTRACTION PHACO AND INTRAOCULAR LENS PLACEMENT (IOC);  Surgeon: Gemma Payor;  Location: [facility];  Service: Ophthalmology;  Laterality: Left;  CDE: 21.19   COLONOSCOPY N/A 09/12/2013   Procedure: COLONOSCOPY;  Surgeon: Malissa Hippo, MD;  Location: [facility];  Service: Endoscopy;  Laterality: N/A;  125   CYSTOSCOPY     [place]   LITHOTRIPSY     [facility]   PARTIAL HYSTERECTOMY     TOTAL HIP ARTHROPLASTY Left 09/11/2019   Procedure: TOTAL HIP ARTHROPLASTY ANTERIOR APPROACH;  Surgeon: Durene Romans, MD;  Location: [facility];  Service: Orthopedics;  Laterality: Left;  70 mins    MEDICATIONS:  acetaminophen (TYLENOL) 500 MG tablet   Blood Glucose Calibration (ONETOUCH ULTRA) LIQD   Blood Glucose Monitoring Suppl (ONE TOUCH ULTRA 2) w/Device KIT   calcium carbonate (TUMS - DOSED IN MG ELEMENTAL CALCIUM) 500 MG chewable tablet   Calcium Carbonate-Vit D-Min (CALTRATE 600+D PLUS MINERALS) 600-800 MG-UNIT TABS   carvedilol (COREG) 12.5 MG tablet   Cholecalciferol (VITAMIN D) 50 MCG (2000 UT) tablet   diltiazem (CARDIZEM CD) 240 MG 24 hr capsule   ELIQUIS 2.5 MG TABS tablet   furosemide (LASIX) 80 MG tablet   insulin aspart protamine- aspart (NOVOLOG MIX 70/30) (70-30) 100 UNIT/ML injection   Menthol, Topical Analgesic, (BIOFREEZE EX)   Multiple Vitamins-Minerals (PRESERVISION AREDS 2 PO)   ONETOUCH ULTRA test strip   Povidone, PF, (IVIZIA DRY EYES) 0.5 % SOLN   pravastatin (PRAVACHOL) 20 MG tablet   Propylene Glycol, PF, (SYSTANE COMPLETE PF) 0.6 % SOLN    traMADol (ULTRAM) 50 MG tablet   No current facility-administered medications for this encounter.    neomycin-polymyxin-dexameth (MAXITROL) 0.1 % ophth ointment    [facility] Ward, PA-C [facility] Pre-Surgical Testing [phone]

## 2022-10-18 NOTE — H&P (Signed)
TOTAL HIP ADMISSION H&P  Patient is admitted for right total hip arthroplasty.  Therapy Plans: HEP Disposition: Home with daughter & son Planned DVT Prophylaxis: Eliquis 2.5 BID DME needed: walker PCP: Nephrologist: Clearance received Cardiologist: Clearance received TXA: IV Allergies: PCN - rash, contrast - CKD Anesthesia Concerns: none BMI: 28.9 Last HgbA1c: 5.9%   Other: - CKD stage 4, CHF, a fib, T2DM, aortic stenosis - NO NSAIDS -- Cr. 2.76 at baseline - hx of left THA -- did well - tramadol /oxycodone, robaxin, tylenol - minimize IVF **   Subjective:  Chief Complaint: right hip pain  HPI: Miranda Garcia, 89 y.o. female, has a history of pain and functional disability in the right hip(s) due to arthritis and patient has failed non-surgical conservative treatments for greater than 12 weeks to include NSAID's and/or analgesics and activity modification.  Onset of symptoms was gradual starting 2 years ago with gradually worsening course since that time.The patient noted no past surgery on the right hip(s).  Patient currently rates pain in the right hip at 9 out of 10 with activity. Patient has worsening of pain with activity and weight bearing, pain that interfers with activities of daily living, and pain with passive range of motion. Patient has evidence of joint space narrowing by imaging studies. This condition presents safety issues increasing the risk of falls.  There is no current active infection.  Patient Active Problem List   Diagnosis Date Noted   Aortic atherosclerosis (HCC) 09/11/2021   Obese 09/12/2019   S/P left THA 09/11/2019   Acute on chronic diastolic CHF (congestive heart failure) (HCC) 08/25/2018   Insulin-requiring or dependent type II diabetes mellitus (HCC) 08/25/2018   Hemoptysis 06/02/2018   Dyspnea 04/17/2018   Pulmonary nodules 04/17/2018   Heme positive stool 08/23/2013   Long term (current) use of anticoagulants 03/03/2012   Chronic  diastolic CHF (congestive heart failure) (HCC) 02/28/2012   Hypomagnesemia 02/26/2012   Elevated troponin 02/26/2012   Congestive heart failure (HCC) 02/26/2012   Atrial fibrillation (HCC) 02/25/2012   Chest pain 02/25/2012   CKD (chronic kidney disease), stage IV (HCC) 02/25/2012   Leukocytosis 02/25/2012   Anemia due to chronic illness 02/25/2012   Diabetes (HCC) 02/25/2012   Past Medical History:  Diagnosis Date   Arthritis    back, knees   Atrial fibrillation (HCC)    CHF (congestive heart failure) (HCC) 08/2018   Diabetes mellitus type II, controlled (HCC)    x 15 yrs   Dysrhythmia    A-fib   Elevated troponin    Hemorrhoids    History of kidney stones    Hypertension    Neuromuscular disorder (HCC)    numbness in hands    Renal insufficiency    stage iv    Past Surgical History:  Procedure Laterality Date   BACK SURGERY  1997   [facility]   CATARACT EXTRACTION W/PHACO  01/11/2011   Procedure: CATARACT EXTRACTION PHACO AND INTRAOCULAR LENS PLACEMENT (IOC);  Surgeon: Gemma Payor;  Location: [facility];  Service: Ophthalmology;  Laterality: Right;  CDE 19.32   CATARACT EXTRACTION W/PHACO  01/21/2011   Procedure: CATARACT EXTRACTION PHACO AND INTRAOCULAR LENS PLACEMENT (IOC);  Surgeon: Gemma Payor;  Location: [facility];  Service: Ophthalmology;  Laterality: Left;  CDE: 21.19   COLONOSCOPY N/A 09/12/2013   Procedure: COLONOSCOPY;  Surgeon: Malissa Hippo, MD;  Location: [facility] SU[facility];  Service: Endoscopy;  Laterality: N/A;  125   CYSTOSCOPY     [facility]  LITHOTRIPSY     [facility]   PARTIAL HYSTERECTOMY     TOTAL HIP ARTHROPLASTY Left 09/11/2019   Procedure: TOTAL HIP ARTHROPLASTY ANTERIOR APPROACH;  Surgeon: Durene Romans, MD;  Location: [facility];  Service: Orthopedics;  Laterality: Left;  70 mins    No current facility-administered medications for this encounter.   Current Outpatient Medications  Medication Sig Dispense Refill Last Dose   acetaminophen (TYLENOL) 500 MG tablet Take  500 mg by mouth 2 (two) times daily.      calcium carbonate (TUMS - DOSED IN MG ELEMENTAL CALCIUM) 500 MG chewable tablet Chew 2 tablets by mouth daily as needed for indigestion or heartburn.      Calcium Carbonate-Vit D-Min (CALTRATE 600+D PLUS MINERALS) 600-800 MG-UNIT TABS Take 1 tablet by mouth daily.      carvedilol (COREG) 12.5 MG tablet Take 12.5 mg by mouth 2 (two) times daily.      Cholecalciferol (VITAMIN D) 50 MCG (2000 UT) tablet Take 2,000 Units by mouth daily.      diltiazem (CARDIZEM CD) 240 MG 24 hr capsule Take 240 mg by mouth daily.      ELIQUIS 2.5 MG TABS tablet TAKE ONE TABLET BY MOUTH TWICE DAILY 60 tablet 5    furosemide (LASIX) 80 MG tablet Take 80 mg by mouth daily.      insulin aspart protamine- aspart (NOVOLOG MIX 70/30) (70-30) 100 UNIT/ML injection Inject 0.12 mLs (12 Units total) into the skin 2 (two) times a day. (Patient taking differently: Inject 12-16 Units into the skin 2 (two) times a day.) 10 mL 11    Menthol, Topical Analgesic, (BIOFREEZE EX) Apply 1 Application topically daily as needed (pain).      Multiple Vitamins-Minerals (PRESERVISION AREDS 2 PO) Take 1 tablet by mouth in the morning and at bedtime.      Povidone, PF, (IVIZIA DRY EYES) 0.5 % SOLN Place 2 drops into both eyes 2 (two) times daily.      pravastatin (PRAVACHOL) 20 MG tablet TAKE 1 TABLET DAILY IN THE EVENING 90 tablet 2    Propylene Glycol, PF, (SYSTANE COMPLETE PF) 0.6 % SOLN Place 1-2 drops into both eyes at bedtime.      traMADol (ULTRAM) 50 MG tablet Take 100 mg by mouth at bedtime.      Blood Glucose Calibration (ONETOUCH ULTRA) LIQD       Blood Glucose Monitoring Suppl (ONE TOUCH ULTRA 2) w/Device KIT SMARTSIG:Via Meter      ONETOUCH ULTRA test strip       Facility-Administered Medications Ordered in Other Encounters  Medication Dose Route Frequency Provider Last Rate Last Admin   neomycin-polymyxin-dexameth (MAXITROL) 0.1 % ophth ointment    PRN Gemma Payor, MD   1 application  at  01/21/11 1427   Allergies  Allergen Reactions   Atorvastatin Other (See Comments)    Leg pains   Contrast Media [Iodinated Contrast Media]     Pt has 1 full functioning kidney   Penicillins Nausea And Vomiting and Rash    Tolerated Cephalosporin Date: 09/11/19.    Social History   Tobacco Use   Smoking status: Never   Smokeless tobacco: Never  Substance Use Topics   Alcohol use: No    Alcohol/week: 0.0 standard drinks of alcohol    Family History  Problem Relation Age of Onset   Colon cancer Brother      Review of Systems  Constitutional:  Negative for chills and fever.  Respiratory:  Negative for cough and  shortness of breath.   Cardiovascular:  Negative for chest pain.  Gastrointestinal:  Negative for nausea and vomiting.  Musculoskeletal:  Positive for arthralgias.     Objective:  Physical Exam Well nourished and well developed. General: Alert and oriented x3, cooperative and pleasant, no acute distress. Head: normocephalic, atraumatic, neck supple. Eyes: EOMI.  Musculoskeletal: Right hip exam: Painful and limited hip flexion internal rotation to 5 degrees with pelvic tilting, external rotation to 20 degrees also with groin pain Active hip flexion with slight external rotation contracture and pain limiting her strength  Left hip exam: Fluid and pain-free range of motion of the left hip following arthroplasty with normal active hip flexion No significant lower extremity edema or erythema  Calves soft and nontender. Motor function intact in LE. Strength 5/5 LE bilaterally. Neuro: Distal pulses 2+. Sensation to light touch intact in LE.  Vital signs in last 24 hours:    Labs:   Estimated body mass index is 30.29 kg/m as calculated from the following:   Height as of 10/14/22: 5\' 3"  (1.6 m).   Weight as of 10/14/22: 77.6 kg.   Imaging Review Plain radiographs demonstrate severe degenerative joint disease of the right hip(s). The bone quality appears to  be adequate for age and reported activity level.      Assessment/Plan:  End stage arthritis, right hip(s)  The patient history, physical examination, clinical judgement of the provider and imaging studies are consistent with end stage degenerative joint disease of the right hip(s) and total hip arthroplasty is deemed medically necessary. The treatment options including medical management, injection therapy, arthroscopy and arthroplasty were discussed at length. The risks and benefits of total hip arthroplasty were presented and reviewed. The risks due to aseptic loosening, infection, stiffness, dislocation/subluxation,  thromboembolic complications and other imponderables were discussed.  The patient acknowledged the explanation, agreed to proceed with the plan and consent was signed. Patient is being admitted for inpatient treatment for surgery, pain control, PT, OT, prophylactic antibiotics, VTE prophylaxis, progressive ambulation and ADL's and discharge planning.The patient is planning to be discharged  home.   Rosalene Billings, PA-C Orthopedic Surgery [facility] [place] [phone]

## 2022-10-19 ENCOUNTER — Ambulatory Visit (HOSPITAL_COMMUNITY): Payer: Medicare HMO

## 2022-10-19 ENCOUNTER — Other Ambulatory Visit: Payer: Self-pay

## 2022-10-19 ENCOUNTER — Observation Stay (HOSPITAL_COMMUNITY): Payer: Medicare HMO

## 2022-10-19 ENCOUNTER — Encounter (HOSPITAL_COMMUNITY)
Admission: RE | Disposition: A | Discharge status: Home / Self Care | Payer: Self-pay | Source: Ambulatory Visit | Attending: Orthopedic Surgery

## 2022-10-19 ENCOUNTER — Observation Stay (HOSPITAL_COMMUNITY)
Admission: RE | Admit: 2022-10-19 | Discharge: 2022-10-20 | Disposition: A | Discharge status: Home / Self Care | Payer: Medicare HMO | Source: Ambulatory Visit | Attending: Orthopedic Surgery | Admitting: Orthopedic Surgery

## 2022-10-19 ENCOUNTER — Encounter (HOSPITAL_COMMUNITY): Payer: Self-pay | Admitting: Orthopedic Surgery

## 2022-10-19 ENCOUNTER — Ambulatory Visit (HOSPITAL_COMMUNITY): Payer: Medicare HMO | Admitting: Physician Assistant

## 2022-10-19 ENCOUNTER — Ambulatory Visit (HOSPITAL_BASED_OUTPATIENT_CLINIC_OR_DEPARTMENT_OTHER): Payer: Medicare HMO | Admitting: Anesthesiology

## 2022-10-19 DIAGNOSIS — E1122 Type 2 diabetes mellitus with diabetic chronic kidney disease: Secondary | ICD-10-CM | POA: Diagnosis not present

## 2022-10-19 DIAGNOSIS — I130 Hypertensive heart and chronic kidney disease with heart failure and stage 1 through stage 4 chronic kidney disease, or unspecified chronic kidney disease: Secondary | ICD-10-CM | POA: Insufficient documentation

## 2022-10-19 DIAGNOSIS — Z7901 Long term (current) use of anticoagulants: Secondary | ICD-10-CM | POA: Insufficient documentation

## 2022-10-19 DIAGNOSIS — Z794 Long term (current) use of insulin: Secondary | ICD-10-CM | POA: Diagnosis not present

## 2022-10-19 DIAGNOSIS — N184 Chronic kidney disease, stage 4 (severe): Secondary | ICD-10-CM | POA: Insufficient documentation

## 2022-10-19 DIAGNOSIS — I4891 Unspecified atrial fibrillation: Secondary | ICD-10-CM | POA: Insufficient documentation

## 2022-10-19 DIAGNOSIS — I5033 Acute on chronic diastolic (congestive) heart failure: Secondary | ICD-10-CM | POA: Insufficient documentation

## 2022-10-19 DIAGNOSIS — Z96641 Presence of right artificial hip joint: Secondary | ICD-10-CM | POA: Diagnosis not present

## 2022-10-19 DIAGNOSIS — Z79899 Other long term (current) drug therapy: Secondary | ICD-10-CM | POA: Insufficient documentation

## 2022-10-19 DIAGNOSIS — Z96642 Presence of left artificial hip joint: Secondary | ICD-10-CM | POA: Insufficient documentation

## 2022-10-19 DIAGNOSIS — M1611 Unilateral primary osteoarthritis, right hip: Secondary | ICD-10-CM | POA: Diagnosis not present

## 2022-10-19 DIAGNOSIS — Z96643 Presence of artificial hip joint, bilateral: Secondary | ICD-10-CM | POA: Diagnosis not present

## 2022-10-19 DIAGNOSIS — Z471 Aftercare following joint replacement surgery: Secondary | ICD-10-CM | POA: Diagnosis not present

## 2022-10-19 HISTORY — PX: TOTAL HIP ARTHROPLASTY: SHX124

## 2022-10-19 LAB — GLUCOSE, CAPILLARY
Glucose-Capillary: 152 mg/dL — ABNORMAL HIGH (ref 70–99)
Glucose-Capillary: 129 mg/dL — ABNORMAL HIGH (ref 70–99)
Glucose-Capillary: 260 mg/dL — ABNORMAL HIGH (ref 70–99)

## 2022-10-19 SURGERY — ARTHROPLASTY, HIP, TOTAL, ANTERIOR APPROACH
Anesthesia: General | Site: Hip | Laterality: Right

## 2022-10-19 MED ORDER — BUPIVACAINE-EPINEPHRINE (PF) 0.25% -1:200000 IJ SOLN
INTRAMUSCULAR | Status: DC | PRN
  Administered 2022-10-19: 30 mL

## 2022-10-19 MED ORDER — TRANEXAMIC ACID-NACL 1000-0.7 MG/100ML-% IV SOLN
1000.0000 mg | Freq: Once | INTRAVENOUS | Status: AC
  Administered 2022-10-19: 1000 mg via INTRAVENOUS
  Filled 2022-10-19: qty 100

## 2022-10-19 MED ORDER — SODIUM CHLORIDE 0.9 % IV SOLN: INTRAVENOUS | Status: DC

## 2022-10-19 MED ORDER — ONDANSETRON HCL 4 MG/2ML IJ SOLN
INTRAMUSCULAR | Status: AC
  Filled 2022-10-19: qty 2

## 2022-10-19 MED ORDER — DIPHENHYDRAMINE HCL 12.5 MG/5ML PO ELIX: 12.5000 mg | ORAL_SOLUTION | ORAL | Status: DC | PRN

## 2022-10-19 MED ORDER — CEFAZOLIN SODIUM-DEXTROSE 2-4 GM/100ML-% IV SOLN
2.0000 g | INTRAVENOUS | Status: AC
  Administered 2022-10-19: 2 g via INTRAVENOUS
  Filled 2022-10-19: qty 100

## 2022-10-19 MED ORDER — CHLORHEXIDINE GLUCONATE 0.12 % MT SOLN
15.0000 mL | Freq: Once | OROMUCOSAL | Status: AC
  Administered 2022-10-19: 15 mL via OROMUCOSAL

## 2022-10-19 MED ORDER — OXYCODONE HCL 5 MG PO TABS: 5.0000 mg | ORAL_TABLET | Freq: Once | ORAL | Status: DC | PRN

## 2022-10-19 MED ORDER — METOCLOPRAMIDE HCL 5 MG PO TABS: 5.0000 mg | ORAL_TABLET | Freq: Three times a day (TID) | ORAL | Status: DC | PRN

## 2022-10-19 MED ORDER — PROPOFOL 500 MG/50ML IV EMUL
INTRAVENOUS | Status: DC | PRN
  Administered 2022-10-19: 65 ug/kg/min via INTRAVENOUS

## 2022-10-19 MED ORDER — ONDANSETRON HCL 4 MG/2ML IJ SOLN
INTRAMUSCULAR | Status: DC | PRN
Start: 2022-10-19 — End: 2022-10-19
  Administered 2022-10-19: 4 mg via INTRAVENOUS

## 2022-10-19 MED ORDER — OXYCODONE HCL 5 MG/5ML PO SOLN: 5.0000 mg | Freq: Once | ORAL | Status: DC | PRN

## 2022-10-19 MED ORDER — DEXAMETHASONE SODIUM PHOSPHATE 10 MG/ML IJ SOLN
8.0000 mg | Freq: Once | INTRAMUSCULAR | Status: AC
  Administered 2022-10-19: 8 mg via INTRAVENOUS

## 2022-10-19 MED ORDER — BUPIVACAINE IN DEXTROSE 0.75-8.25 % IT SOLN
INTRATHECAL | Status: DC | PRN
  Administered 2022-10-19: 1.6 mL via INTRATHECAL

## 2022-10-19 MED ORDER — DEXAMETHASONE SODIUM PHOSPHATE 10 MG/ML IJ SOLN
INTRAMUSCULAR | Status: AC
  Filled 2022-10-19: qty 1

## 2022-10-19 MED ORDER — PHENYLEPHRINE HCL-NACL 20-0.9 MG/250ML-% IV SOLN
INTRAVENOUS | Status: DC | PRN
  Administered 2022-10-19: 40 ug/min via INTRAVENOUS

## 2022-10-19 MED ORDER — OXYCODONE HCL 5 MG PO TABS
5.0000 mg | ORAL_TABLET | ORAL | Status: DC | PRN
  Administered 2022-10-19 – 2022-10-20 (×2): 5 mg via ORAL
  Filled 2022-10-19 (×2): qty 1

## 2022-10-19 MED ORDER — 0.9 % SODIUM CHLORIDE (POUR BTL) OPTIME
TOPICAL | Status: DC | PRN
  Administered 2022-10-19: 1000 mL

## 2022-10-19 MED ORDER — METOCLOPRAMIDE HCL 5 MG/ML IJ SOLN: 5.0000 mg | Freq: Three times a day (TID) | INTRAMUSCULAR | Status: DC | PRN

## 2022-10-19 MED ORDER — APIXABAN 2.5 MG PO TABS
2.5000 mg | ORAL_TABLET | Freq: Two times a day (BID) | ORAL | Status: DC
  Administered 2022-10-20: 2.5 mg via ORAL
  Filled 2022-10-19: qty 1

## 2022-10-19 MED ORDER — MENTHOL 3 MG MT LOZG: 1.0000 | LOZENGE | OROMUCOSAL | Status: DC | PRN

## 2022-10-19 MED ORDER — METHOCARBAMOL 500 MG IVPB - SIMPLE MED: 500.0000 mg | Freq: Four times a day (QID) | INTRAVENOUS | Status: DC | PRN

## 2022-10-19 MED ORDER — METHOCARBAMOL 500 MG PO TABS
500.0000 mg | ORAL_TABLET | Freq: Four times a day (QID) | ORAL | Status: DC | PRN
  Administered 2022-10-19: 500 mg via ORAL
  Filled 2022-10-19: qty 1

## 2022-10-19 MED ORDER — KETOROLAC TROMETHAMINE 30 MG/ML IJ SOLN
INTRAMUSCULAR | Status: DC | PRN
  Administered 2022-10-19: 30 mg

## 2022-10-19 MED ORDER — TRANEXAMIC ACID-NACL 1000-0.7 MG/100ML-% IV SOLN
1000.0000 mg | INTRAVENOUS | Status: AC
  Administered 2022-10-19: 1000 mg via INTRAVENOUS
  Filled 2022-10-19: qty 100

## 2022-10-19 MED ORDER — INSULIN ASPART PROT & ASPART (70-30 MIX) 100 UNIT/ML ~~LOC~~ SUSP
12.0000 [IU] | Freq: Two times a day (BID) | SUBCUTANEOUS | Status: DC
  Administered 2022-10-19 – 2022-10-20 (×2): 12 [IU] via SUBCUTANEOUS
  Filled 2022-10-19: qty 10

## 2022-10-19 MED ORDER — PROPOFOL 10 MG/ML IV BOLUS
INTRAVENOUS | Status: AC
  Filled 2022-10-19: qty 20

## 2022-10-19 MED ORDER — BUPIVACAINE-EPINEPHRINE 0.25% -1:200000 IJ SOLN
INTRAMUSCULAR | Status: AC
  Filled 2022-10-19: qty 1

## 2022-10-19 MED ORDER — PROPOFOL 10 MG/ML IV BOLUS
INTRAVENOUS | Status: DC | PRN
Start: 2022-10-19 — End: 2022-10-19
  Administered 2022-10-19: 20 mg via INTRAVENOUS
  Administered 2022-10-19: 40 mg via INTRAVENOUS

## 2022-10-19 MED ORDER — SODIUM CHLORIDE (PF) 0.9 % IJ SOLN
INTRAMUSCULAR | Status: AC
  Filled 2022-10-19: qty 50

## 2022-10-19 MED ORDER — PHENOL 1.4 % MT LIQD: 1.0000 | OROMUCOSAL | Status: DC | PRN

## 2022-10-19 MED ORDER — BISACODYL 10 MG RE SUPP: 10.0000 mg | Freq: Every day | RECTAL | Status: DC | PRN

## 2022-10-19 MED ORDER — INSULIN ASPART 100 UNIT/ML IJ SOLN: 0.0000 [IU] | INTRAMUSCULAR | Status: DC | PRN

## 2022-10-19 MED ORDER — SODIUM CHLORIDE (PF) 0.9 % IJ SOLN
INTRAMUSCULAR | Status: DC | PRN
  Administered 2022-10-19: 30 mL

## 2022-10-19 MED ORDER — TRAMADOL HCL 50 MG PO TABS: 50.0000 mg | ORAL_TABLET | Freq: Four times a day (QID) | ORAL | Status: DC | PRN

## 2022-10-19 MED ORDER — HYDROMORPHONE HCL 1 MG/ML IJ SOLN: 0.2500 mg | INTRAMUSCULAR | Status: DC | PRN

## 2022-10-19 MED ORDER — HYDROMORPHONE HCL 1 MG/ML IJ SOLN: 0.5000 mg | INTRAMUSCULAR | Status: DC | PRN

## 2022-10-19 MED ORDER — ONDANSETRON HCL 4 MG/2ML IJ SOLN: 4.0000 mg | Freq: Four times a day (QID) | INTRAMUSCULAR | Status: DC | PRN

## 2022-10-19 MED ORDER — DEXAMETHASONE SODIUM PHOSPHATE 10 MG/ML IJ SOLN
10.0000 mg | Freq: Once | INTRAMUSCULAR | Status: AC
  Administered 2022-10-20: 10 mg via INTRAVENOUS
  Filled 2022-10-19: qty 1

## 2022-10-19 MED ORDER — ORAL CARE MOUTH RINSE: 15.0000 mL | Freq: Once | OROMUCOSAL | Status: AC

## 2022-10-19 MED ORDER — CARVEDILOL 12.5 MG PO TABS
12.5000 mg | ORAL_TABLET | Freq: Two times a day (BID) | ORAL | Status: DC
  Administered 2022-10-19 – 2022-10-20 (×2): 12.5 mg via ORAL
  Filled 2022-10-19 (×2): qty 1

## 2022-10-19 MED ORDER — LACTATED RINGERS IV SOLN: INTRAVENOUS | Status: DC

## 2022-10-19 MED ORDER — CALCIUM CARBONATE ANTACID 500 MG PO CHEW
2.0000 | CHEWABLE_TABLET | Freq: Every day | ORAL | Status: DC | PRN
  Administered 2022-10-20: 400 mg via ORAL
  Filled 2022-10-19: qty 2

## 2022-10-19 MED ORDER — PROPOFOL 1000 MG/100ML IV EMUL
INTRAVENOUS | Status: AC
  Filled 2022-10-19: qty 100

## 2022-10-19 MED ORDER — ONDANSETRON HCL 4 MG/2ML IJ SOLN: 4.0000 mg | Freq: Once | INTRAMUSCULAR | Status: DC | PRN

## 2022-10-19 MED ORDER — ACETAMINOPHEN 500 MG PO TABS
1000.0000 mg | ORAL_TABLET | Freq: Four times a day (QID) | ORAL | Status: DC
  Administered 2022-10-19 – 2022-10-20 (×3): 1000 mg via ORAL
  Filled 2022-10-19 (×3): qty 2

## 2022-10-19 MED ORDER — POLYETHYLENE GLYCOL 3350 17 G PO PACK: 17.0000 g | PACK | Freq: Two times a day (BID) | ORAL | Status: DC

## 2022-10-19 MED ORDER — INSULIN ASPART 100 UNIT/ML IJ SOLN
0.0000 [IU] | Freq: Three times a day (TID) | INTRAMUSCULAR | Status: DC
  Administered 2022-10-20: 5 [IU] via SUBCUTANEOUS
  Administered 2022-10-20: 3 [IU] via SUBCUTANEOUS

## 2022-10-19 MED ORDER — CEFAZOLIN SODIUM-DEXTROSE 2-4 GM/100ML-% IV SOLN
2.0000 g | Freq: Four times a day (QID) | INTRAVENOUS | Status: AC
  Administered 2022-10-19 (×2): 2 g via INTRAVENOUS
  Filled 2022-10-19 (×2): qty 100

## 2022-10-19 MED ORDER — FUROSEMIDE 40 MG PO TABS
80.0000 mg | ORAL_TABLET | Freq: Every day | ORAL | Status: DC
  Administered 2022-10-20: 80 mg via ORAL
  Filled 2022-10-19: qty 2

## 2022-10-19 MED ORDER — POLYVINYL ALCOHOL 1.4 % OP SOLN
1.0000 [drp] | Freq: Every day | OPHTHALMIC | Status: DC
  Administered 2022-10-19: 2 [drp] via OPHTHALMIC
  Filled 2022-10-19: qty 15

## 2022-10-19 MED ORDER — DOCUSATE SODIUM 100 MG PO CAPS
100.0000 mg | ORAL_CAPSULE | Freq: Two times a day (BID) | ORAL | Status: DC
  Administered 2022-10-19 – 2022-10-20 (×2): 100 mg via ORAL
  Filled 2022-10-19 (×2): qty 1

## 2022-10-19 MED ORDER — ONDANSETRON HCL 4 MG PO TABS: 4.0000 mg | ORAL_TABLET | Freq: Four times a day (QID) | ORAL | Status: DC | PRN

## 2022-10-19 MED ORDER — KETOROLAC TROMETHAMINE 30 MG/ML IJ SOLN
INTRAMUSCULAR | Status: AC
  Filled 2022-10-19: qty 1

## 2022-10-19 MED ORDER — STERILE WATER FOR IRRIGATION IR SOLN
Status: DC | PRN
  Administered 2022-10-19: 2000 mL

## 2022-10-19 MED ORDER — PRAVASTATIN SODIUM 20 MG PO TABS
20.0000 mg | ORAL_TABLET | Freq: Every evening | ORAL | Status: DC
  Administered 2022-10-19: 20 mg via ORAL
  Filled 2022-10-19: qty 1

## 2022-10-19 MED ORDER — POVIDONE-IODINE 10 % EX SWAB: 2.0000 | Freq: Once | CUTANEOUS | Status: DC

## 2022-10-19 MED ORDER — DILTIAZEM HCL ER COATED BEADS 240 MG PO CP24
240.0000 mg | ORAL_CAPSULE | Freq: Every day | ORAL | Status: DC
  Administered 2022-10-20: 240 mg via ORAL
  Filled 2022-10-19: qty 1

## 2022-10-19 SURGICAL SUPPLY — 44 items
ADH SKN CLS APL DERMABOND .7 (GAUZE/BANDAGES/DRESSINGS) ×1
BAG COUNTER SPONGE SURGICOUNT (BAG) IMPLANT
BAG SPEC THK2 15X12 ZIP CLS (MISCELLANEOUS)
BAG SPNG CNTER NS LX DISP (BAG)
BAG ZIPLOCK 12X15 (MISCELLANEOUS) IMPLANT
BLADE SAG 18X100X1.27 (BLADE) ×1 IMPLANT
CABLE CERLAGE W/CRIMP 1.8 (Cable) IMPLANT
COVER PERINEAL POST (MISCELLANEOUS) ×1 IMPLANT
COVER SURGICAL LIGHT HANDLE (MISCELLANEOUS) ×1 IMPLANT
CUP ACETBLR 52 OD PINNACLE (Hips) IMPLANT
DERMABOND ADVANCED .7 DNX12 (GAUZE/BANDAGES/DRESSINGS) ×1 IMPLANT
DRAPE FOOT SWITCH (DRAPES) ×1 IMPLANT
DRAPE STERI IOBAN 125X83 (DRAPES) ×1 IMPLANT
DRAPE U-SHAPE 47X51 STRL (DRAPES) ×2 IMPLANT
DRESSING AQUACEL AG SP 3.5X10 (GAUZE/BANDAGES/DRESSINGS) ×1 IMPLANT
DRSG AQUACEL AG ADV 3.5X10 (GAUZE/BANDAGES/DRESSINGS) IMPLANT
DRSG AQUACEL AG SP 3.5X10 (GAUZE/BANDAGES/DRESSINGS) ×1
DURAPREP 26ML APPLICATOR (WOUND CARE) ×1 IMPLANT
ELECT REM PT RETURN 15FT ADLT (MISCELLANEOUS) ×1 IMPLANT
GLOVE BIO SURGEON STRL SZ 6 (GLOVE) ×1 IMPLANT
GLOVE BIOGEL PI IND STRL 6.5 (GLOVE) ×1 IMPLANT
GLOVE BIOGEL PI IND STRL 7.5 (GLOVE) ×1 IMPLANT
GLOVE ORTHO TXT STRL SZ7.5 (GLOVE) ×2 IMPLANT
GOWN STRL REUS W/ TWL LRG LVL3 (GOWN DISPOSABLE) ×2 IMPLANT
GOWN STRL REUS W/TWL LRG LVL3 (GOWN DISPOSABLE) ×2
HEAD M SROM 36MM PLUS 1.5 (Hips) IMPLANT
HOLDER FOLEY CATH W/STRAP (MISCELLANEOUS) ×1 IMPLANT
KIT TURNOVER KIT A (KITS) IMPLANT
LINER NEUTRAL 52X36MM PLUS 4 (Liner) IMPLANT
NDL SAFETY ECLIP 18X1.5 (MISCELLANEOUS) IMPLANT
PACK ANTERIOR HIP CUSTOM (KITS) ×1 IMPLANT
SCREW 6.5MMX25MM (Screw) IMPLANT
SROM M HEAD 36MM PLUS 1.5 (Hips) ×1 IMPLANT
STEM FEMORAL SZ 6MM STD ACTIS (Stem) IMPLANT
SUT MNCRL AB 4-0 PS2 18 (SUTURE) ×1 IMPLANT
SUT STRATAFIX 0 PDS 27 VIOLET (SUTURE) ×1
SUT VIC AB 1 CT1 36 (SUTURE) ×3 IMPLANT
SUT VIC AB 2-0 CT1 27 (SUTURE) ×2
SUT VIC AB 2-0 CT1 TAPERPNT 27 (SUTURE) ×2 IMPLANT
SUTURE STRATFX 0 PDS 27 VIOLET (SUTURE) ×1 IMPLANT
SYR 3ML LL SCALE MARK (SYRINGE) IMPLANT
TRAY FOLEY MTR SLVR 16FR STAT (SET/KITS/TRAYS/PACK) IMPLANT
TUBE SUCTION HIGH CAP CLEAR NV (SUCTIONS) ×1 IMPLANT
WATER STERILE IRR 1000ML POUR (IV SOLUTION) ×1 IMPLANT

## 2022-10-19 NOTE — Op Note (Signed)
NAME:  LAKE ERNEY                ACCOUNT NO.: [id number]      MEDICAL RECORD NO.: [id number]      FACILITY:  [facility]      PHYSICIAN:  Shelda Pal  DATE OF BIRTH:  1930-06-30     DATE OF PROCEDURE:  10/19/2022                                 OPERATIVE REPORT         PREOPERATIVE DIAGNOSIS: Right  hip osteoarthritis.      POSTOPERATIVE DIAGNOSIS:  Right hip osteoarthritis.      PROCEDURE:  Right total hip replacement through an anterior approach   utilizing DePuy THR system, component size 52 mm pinnacle cup, a size 36+4 neutral   Altrex liner, a size 6 standard Actis stem with a 36+1.6 Articuleze metal ball.   A single Zimmer cable was placed in the subtrochanteric region due to findings identified intraoperative.     SURGEON:  Madlyn Frankel. Charlann Boxer, M.D.      ASSISTANT:  Rosalene Billings, PA-C     ANESTHESIA:  Spinal.      SPECIMENS:  None.      COMPLICATIONS:  None.      BLOOD LOSS:  200 cc     DRAINS:  None.      INDICATION OF THE PROCEDURE:  Miranda Garcia is a 89 y.o. female who had   presented to office for evaluation of right hip pain.  Radiographs revealed   progressive degenerative changes with bone-on-bone   articulation of the  hip joint, including subchondral cystic changes and osteophytes.  The patient had painful limited range of   motion significantly affecting their overall quality of life and function.  The patient was failing to    respond to conservative measures including medications and/or injections and activity modification and at this point was ready   to proceed with more definitive measures.  Consent was obtained for   benefit of pain relief.  Specific risks of infection, DVT, component   failure, dislocation, neurovascular injury, and need for revision surgery were reviewed in the office.     PROCEDURE IN DETAIL:  The patient was brought to operative theater.   Once adequate anesthesia, preoperative antibiotics, 2 gm  of Ancef, 1 gm of Tranexamic Acid, and 10 mg of Decadron were administered, the patient was positioned supine on the [employer] table.  Once the patient was safely positioned with adequate padding of boney prominences we predraped out the hip, and used fluoroscopy to confirm orientation of the pelvis.      The right hip was then prepped and draped from proximal iliac crest to   mid thigh with a shower curtain technique.      Time-out was performed identifying the patient, planned procedure, and the appropriate extremity.     An incision was then made 2 cm lateral to the   anterior superior iliac spine extending over the orientation of the   tensor fascia lata muscle and sharp dissection was carried down to the   fascia of the muscle.      The fascia was then incised.  The muscle belly was identified and swept   laterally and retractor placed along the superior neck.  Following   cauterization of the circumflex vessels and removing  some pericapsular   fat, a second cobra retractor was placed on the inferior neck.  A T-capsulotomy was made along the line of the   superior neck to the trochanteric fossa, then extended proximally and   distally.  Tag sutures were placed and the retractors were then placed   intracapsular.  We then identified the trochanteric fossa and   orientation of my neck cut and then made a neck osteotomy with the femur on traction.  The femoral   head was removed without difficulty or complication.  Traction was let   off and retractors were placed posterior and anterior around the   acetabulum.      The labrum and foveal tissue were debrided.  I began reaming with a 48 mm   reamer and reamed up to 51 mm reamer with good bony bed preparation and a 52 mm  cup was chosen.  The final 52 mm Pinnacle cup was then impacted under fluoroscopy to confirm the depth of penetration and orientation with respect to   Abduction and forward flexion.  A screw was placed into the ilium  followed by the hole eliminator.  The final   36+4 neutral Altrex liner was impacted with good visualized rim fit.  The cup was positioned anatomically within the acetabular portion of the pelvis.      At this point, the femur was rolled to 100 degrees.  Further capsule was   released off the inferior aspect of the femoral neck.  I then   released the superior capsule proximally.  With the leg in a neutral position the hook was placed laterally   along the femur under the vastus lateralis origin and elevated manually and then held in position using the hook attachment on the bed.  The leg was then extended and adducted with the leg rolled to 100   degrees of external rotation.  Retractors were placed along the medial calcar and posteriorly over the greater trochanter.  Once the proximal femur was fully   exposed, I used a box osteotome to set orientation.  I then began   broaching with the starting chili pepper broach and passed this by hand and then broached up initially to a size 5 broach to match the other side.  I did a trial reduction with a standard offset neck.  All this went routinely and is to be expected.  When we dislocated the hip to consider placing a size 6 broach in I placed all the routine retractors medially and posteriorly.  It was at this point that there felt to be a release.  I could identify a fracture involving the greater trochanter but did not know if it extended distally.  I replaced the 5 broach and reduced the hip again with a standard offset neck and +1.5 ball.  On these radiographs I was unable identify any fracture including of the greater trochanter at least without displacement.  I decided this point to place a subtrochanteric cable to prevent any potential propagation if present.   We opened up the Zimmer cable set.  I passed a Zimmer cable in the subtrochanteric region and provisionally tightened this.  We then dislocated the hip and remove the trial components.  I was  then able to broach up safely to the size 6 broach.  With the 6 broach in placed the standard neck and repeated the trial reduction with the +1.5 ball.     The offset was appropriate, leg lengths   appeared  to be equal best matched with the +1.5 head ball trial confirmed radiographically.  Additionally I did not identify any significant fracture lines nor any displacement of the greater trochanter.  Given these findings, I went ahead and dislocated the hip, repositioned all   retractors and positioned the right hip in the extended and abducted position.  The final 6 standard Actis stem was   chosen and it was impacted down to the level of neck cut.  Based on this   and the trial reductions, a final 36+1.5 Articuleze metal head ball was chosen and   impacted onto a clean and dry trunnion, and the hip was reduced.  The   hip had been irrigated throughout the case again at this point.  I did   reapproximate the superior capsular leaflet to the anterior leaflet   using #1 Vicryl.  The fascia of the   tensor fascia lata muscle was then reapproximated using #1 Vicryl and #0 Stratafix sutures.  The   remaining wound was closed with 2-0 Vicryl and running 4-0 Monocryl.   The hip was cleaned, dried, and dressed sterilely using Dermabond and   Aquacel dressing.  The patient was then brought   to recovery room in stable condition tolerating the procedure well.    Rosalene Billings, PA-C was present for the entirety of the case involved from   preoperative positioning, perioperative retractor management, general   facilitation of the case, as well as primary wound closure as assistant.            Madlyn Frankel Charlann Boxer, M.D.        10/19/2022 8:56 AM

## 2022-10-19 NOTE — Anesthesia Postprocedure Evaluation (Signed)
Anesthesia Post Note  Patient: Miranda Garcia  Procedure(s) Performed: TOTAL HIP ARTHROPLASTY ANTERIOR APPROACH (Right: Hip)     Patient location during evaluation: PACU Anesthesia Type: General Level of consciousness: oriented and awake and alert Pain management: pain level controlled Vital Signs Assessment: post-procedure vital signs reviewed and stable Respiratory status: spontaneous breathing, respiratory function stable and patient connected to nasal cannula oxygen Cardiovascular status: blood pressure returned to baseline and stable Postop Assessment: no headache, no backache and no apparent nausea or vomiting Anesthetic complications: no  No notable events documented.  Last Vitals:  Vitals:   10/19/22 1330 10/19/22 1345  BP: 117/63 (!) 120/58  Pulse: 68 64  Resp: (!) 23 18  Temp:  36.6 C  SpO2: 100% 100%    Last Pain:  Vitals:   10/19/22 1345  TempSrc:   PainSc: 0-No pain                 Dain Laseter S

## 2022-10-19 NOTE — Discharge Instructions (Signed)

## 2022-10-19 NOTE — Progress Notes (Signed)
PT Note  Patient Details Name: Miranda Garcia MRN: [id number] DOB: 1930-06-13   Cancelled Treatment:    Reason Eval/Treat Not Completed: Patient eating dinner in bed when PT arrived in evening. Pt and family aware and anticipate PT evaluation 10/20/2022.   Johnny Bridge, PT Acute Rehab   Jacqualyn Posey 10/19/2022, 5:56 PM

## 2022-10-19 NOTE — Care Plan (Signed)
Ortho Bundle Case Management Note  Patient Details  Name: Miranda Garcia MRN: [id number] Date of Birth: May 06, 1930  R THA on 10-19-22 DCP:  Home with dtr and son DME:  RW ordered through [employer] PT:  HEP                   DME Arranged:  Dan Humphreys rolling DME Agency:  [employer]  HH Arranged:  NA HH Agency:  NA  Additional Comments: Please contact me with any questions of if this plan should need to change.  Ennis Forts, RN,CCM [facility]  [phone] 10/19/2022, 12:45 PM

## 2022-10-19 NOTE — Transfer of Care (Signed)
Immediate Anesthesia Transfer of Care Note  Patient: Miranda Garcia  Procedure(s) Performed: TOTAL HIP ARTHROPLASTY ANTERIOR APPROACH (Right: Hip)  Patient Location: PACU  Anesthesia Type:Spinal  Level of Consciousness: drowsy and patient cooperative  Airway & Oxygen Therapy: Patient Spontanous Breathing and Patient connected to face mask oxygen  Post-op Assessment: Report given to RN and Post -op Vital signs reviewed and stable  Post vital signs: Reviewed and stable  Last Vitals:  Vitals Value Taken Time  BP 98/47 10/19/22 1303  Temp    Pulse 66 10/19/22 1306  Resp 16 10/19/22 1306  SpO2 100 % 10/19/22 1306  Vitals shown include unfiled device data.  Last Pain:  Vitals:   10/19/22 0910  TempSrc: Oral         Complications: No notable events documented.

## 2022-10-19 NOTE — Interval H&P Note (Signed)
History and Physical Interval Note:  10/19/2022 8:55 AM  Miranda Garcia  has presented today for surgery, with the diagnosis of Right hip osteoarthritis.  The various methods of treatment have been discussed with the patient and family. After consideration of risks, benefits and other options for treatment, the patient has consented to  Procedure(s): TOTAL HIP ARTHROPLASTY ANTERIOR APPROACH (Right) as a surgical intervention.  The patient's history has been reviewed, patient examined, no change in status, stable for surgery.  I have reviewed the patient's chart and labs.  Questions were answered to the patient's satisfaction.     Shelda Pal

## 2022-10-19 NOTE — Anesthesia Procedure Notes (Addendum)
Procedure Name: MAC Date/Time: 10/19/2022 11:14 AM  Performed by: Maurene Capes, CRNAPre-anesthesia Checklist: Patient identified, Emergency Drugs available, Suction available and Patient being monitored Patient Re-evaluated:Patient Re-evaluated prior to induction Oxygen Delivery Method: Simple face mask Induction Type: IV induction Placement Confirmation: positive ETCO2 Dental Injury: Teeth and Oropharynx as per pre-operative assessment

## 2022-10-19 NOTE — Anesthesia Procedure Notes (Signed)
Spinal  Patient location during procedure: OR Start time: 10/19/2022 11:16 AM End time: 10/19/2022 11:20 AM Reason for block: surgical anesthesia Staffing Performed: anesthesiologist  Anesthesiologist: Eilene Ghazi, MD Performed by: Eilene Ghazi, MD Authorized by: Eilene Ghazi, MD   Preanesthetic Checklist Completed: patient identified, IV checked, site marked, risks and benefits discussed, surgical consent, monitors and equipment checked, pre-op evaluation and timeout performed Spinal Block Patient position: sitting Prep: Betadine Patient monitoring: heart rate, continuous pulse ox and blood pressure Approach: midline Location: L3-4 Injection technique: single-shot Needle Needle type: Sprotte  Needle gauge: 24 G Needle length: 9 cm Assessment Sensory level: T6 Events: CSF return Additional Notes

## 2022-10-20 DIAGNOSIS — Z794 Long term (current) use of insulin: Secondary | ICD-10-CM | POA: Diagnosis not present

## 2022-10-20 DIAGNOSIS — I4891 Unspecified atrial fibrillation: Secondary | ICD-10-CM | POA: Diagnosis not present

## 2022-10-20 DIAGNOSIS — I5033 Acute on chronic diastolic (congestive) heart failure: Secondary | ICD-10-CM | POA: Diagnosis not present

## 2022-10-20 DIAGNOSIS — E1122 Type 2 diabetes mellitus with diabetic chronic kidney disease: Secondary | ICD-10-CM | POA: Diagnosis not present

## 2022-10-20 DIAGNOSIS — Z79899 Other long term (current) drug therapy: Secondary | ICD-10-CM | POA: Diagnosis not present

## 2022-10-20 DIAGNOSIS — I130 Hypertensive heart and chronic kidney disease with heart failure and stage 1 through stage 4 chronic kidney disease, or unspecified chronic kidney disease: Secondary | ICD-10-CM | POA: Diagnosis not present

## 2022-10-20 DIAGNOSIS — Z96642 Presence of left artificial hip joint: Secondary | ICD-10-CM | POA: Diagnosis not present

## 2022-10-20 DIAGNOSIS — M1611 Unilateral primary osteoarthritis, right hip: Secondary | ICD-10-CM | POA: Diagnosis not present

## 2022-10-20 DIAGNOSIS — Z7901 Long term (current) use of anticoagulants: Secondary | ICD-10-CM | POA: Diagnosis not present

## 2022-10-20 DIAGNOSIS — N184 Chronic kidney disease, stage 4 (severe): Secondary | ICD-10-CM | POA: Diagnosis not present

## 2022-10-20 LAB — GLUCOSE, CAPILLARY
Glucose-Capillary: 205 mg/dL — ABNORMAL HIGH (ref 70–99)
Glucose-Capillary: 191 mg/dL — ABNORMAL HIGH (ref 70–99)

## 2022-10-20 MED ORDER — OXYCODONE HCL 5 MG PO TABS: 2.5000 mg | ORAL_TABLET | ORAL | 0 refills | Status: AC | PRN

## 2022-10-20 MED ORDER — POLYETHYLENE GLYCOL 3350 17 G PO PACK: 17.0000 g | PACK | Freq: Two times a day (BID) | ORAL | 0 refills | Status: AC

## 2022-10-20 MED ORDER — METHOCARBAMOL 500 MG PO TABS: 500.0000 mg | ORAL_TABLET | Freq: Four times a day (QID) | ORAL | 2 refills | Status: AC | PRN

## 2022-10-20 NOTE — Care Management Obs Status (Signed)
MEDICARE OBSERVATION STATUS NOTIFICATION   Patient Details  Name: Miranda Garcia MRN: [id number] Date of Birth: [birth year]   Medicare Observation Status Notification Given:  Yes    Ewing Schlein, LCSW 10/20/2022, 12:53 PM

## 2022-10-20 NOTE — Plan of Care (Signed)
  Problem: Education: Goal: Ability to describe self-care measures that may prevent or decrease complications (Diabetes Survival Skills Education) will improve Outcome: Adequate for Discharge Goal: Individualized Educational Video(s) Outcome: Adequate for Discharge   Problem: Coping: Goal: Ability to adjust to condition or change in health will improve Outcome: Adequate for Discharge   Problem: Fluid Volume: Goal: Ability to maintain a balanced intake and output will improve Outcome: Adequate for Discharge   Problem: Health Behavior/Discharge Planning: Goal: Ability to identify and utilize available resources and services will improve Outcome: Adequate for Discharge Goal: Ability to manage health-related needs will improve Outcome: Adequate for Discharge   Problem: Metabolic: Goal: Ability to maintain appropriate glucose levels will improve Outcome: Adequate for Discharge   Problem: Nutritional: Goal: Maintenance of adequate nutrition will improve Outcome: Adequate for Discharge Goal: Progress toward achieving an optimal weight will improve Outcome: Adequate for Discharge   Problem: Skin Integrity: Goal: Risk for impaired skin integrity will decrease Outcome: Adequate for Discharge   Problem: Tissue Perfusion: Goal: Adequacy of tissue perfusion will improve Outcome: Adequate for Discharge   Problem: Education: Goal: Knowledge of the prescribed therapeutic regimen will improve Outcome: Adequate for Discharge Goal: Understanding of discharge needs will improve Outcome: Adequate for Discharge Goal: Individualized Educational Video(s) Outcome: Adequate for Discharge   Problem: Activity: Goal: Ability to avoid complications of mobility impairment will improve Outcome: Adequate for Discharge Goal: Ability to tolerate increased activity will improve Outcome: Adequate for Discharge   Problem: Clinical Measurements: Goal: Postoperative complications will be avoided or  minimized Outcome: Adequate for Discharge   Problem: Pain Management: Goal: Pain level will decrease with appropriate interventions Outcome: Adequate for Discharge   Problem: Skin Integrity: Goal: Will show signs of wound healing Outcome: Adequate for Discharge   Problem: Education: Goal: Knowledge of General Education information will improve Description: Including pain rating scale, medication(s)/side effects and non-pharmacologic comfort measures Outcome: Adequate for Discharge   Problem: Health Behavior/Discharge Planning: Goal: Ability to manage health-related needs will improve Outcome: Adequate for Discharge   Problem: Clinical Measurements: Goal: Ability to maintain clinical measurements within normal limits will improve Outcome: Adequate for Discharge Goal: Will remain free from infection Outcome: Adequate for Discharge Goal: Diagnostic test results will improve Outcome: Adequate for Discharge Goal: Respiratory complications will improve Outcome: Adequate for Discharge Goal: Cardiovascular complication will be avoided Outcome: Adequate for Discharge   Problem: Activity: Goal: Risk for activity intolerance will decrease Outcome: Adequate for Discharge   Problem: Nutrition: Goal: Adequate nutrition will be maintained Outcome: Adequate for Discharge   Problem: Coping: Goal: Level of anxiety will decrease Outcome: Adequate for Discharge   Problem: Elimination: Goal: Will not experience complications related to bowel motility Outcome: Adequate for Discharge Goal: Will not experience complications related to urinary retention Outcome: Adequate for Discharge   Problem: Pain Managment: Goal: General experience of comfort will improve Outcome: Adequate for Discharge   Problem: Safety: Goal: Ability to remain free from injury will improve Outcome: Adequate for Discharge   Problem: Skin Integrity: Goal: Risk for impaired skin integrity will decrease Outcome:  Adequate for Discharge

## 2022-10-20 NOTE — Progress Notes (Signed)
Physical Therapy Treatment Patient Details Name: Miranda Garcia MRN: [id number] DOB: [birth year] Today's Date: 10/20/2022   History of Present Illness 89 yo female presents to therapy s/p R THA, anterior approach on 10/19/2022 due to failure of conservative measures.  in op fracture involving the greater trochanter identified but did not know if it extended distally.  placed a subtrochanteric cable to prevent any potential propagation    Pt is currently R LE 50% WB and no active R hip abduction. Pt PMH includes but is not limited to: L THA, dCHF, hemoptysis, dyspnea, pulmonary nodules, A-fib and chronic anticoagulant therapy, CKD IV, anemia, DM II and back surgery.    PT Comments  Pt is pleasant, cooperative and motivated to work with PT. Family present for session. Reviewed areas as noted below as well as progression of THA HEP/precautions. Pt meeting goals and feels ready to d/c home today with family assisting    If plan is discharge home, recommend the following: A little help with walking and/or transfers;Assistance with cooking/housework;A little help with bathing/dressing/bathroom;Assist for transportation;Help with stairs or ramp for entrance   Can travel by private vehicle        Equipment Recommendations  None recommended by PT    Recommendations for Other Services       Precautions / Restrictions Precautions Precautions: Fall Precaution Comments: no active abduction Restrictions Weight Bearing Restrictions: No RLE Weight Bearing: Weight bearing as tolerated Other Position/Activity Restrictions: no active ABDuction R hip     Mobility  Bed Mobility Overal bed mobility: Needs Assistance Bed Mobility: Supine to Sit     Supine to sit: Min assist     General bed mobility comments: in recliner    Transfers Overall transfer level: Needs assistance Equipment used: Rolling walker (2 wheels) Transfers: Sit to/from Stand Sit to Stand: Supervision   Step pivot  transfers: Min guard, Min assist       General transfer comment: cues for hand placement and RLE position    Ambulation/Gait Ambulation/Gait assistance: Supervision, Min guard Gait Distance (Feet): 80 Feet Assistive device: Rolling walker (2 wheels) Gait Pattern/deviations: Step-to pattern       General Gait Details: cues for sequence, PWB RLE, RW position; family present and able to cue assist as needed   Stairs Stairs: Yes Stairs assistance: Min guard Stair Management: Step to pattern, Forwards, Two rails Number of Stairs: 5 (x2) General stair comments: cues for sequence and technique. family present for stair training; pt with good stability, no LOB   Wheelchair Mobility     Tilt Bed    Modified Rankin (Stroke Patients Only)       Balance Overall balance assessment: Needs assistance Sitting-balance support: Feet supported, No upper extremity supported Sitting balance-Leahy Scale: Good       Standing balance-Leahy Scale: Fair Standing balance comment: pt is able to manipulate LB garment with supervision, no UE support no LOB                            Cognition Arousal/Alertness: Awake/alert Behavior During Therapy: WFL for tasks assessed/performed Overall Cognitive Status: Within Functional Limits for tasks assessed                                          Exercises Total Joint Exercises Ankle Circles/Pumps: AROM, Both, 10 reps Quad Sets: AROM, Both,  5 reps Short Arc Quad: AROM, Strengthening, Right, 10 reps Heel Slides: AROM, Right, 10 reps    General Comments        Pertinent Vitals/Pain Pain Assessment Pain Assessment: No/denies pain Faces Pain Scale: Hurts a little bit Pain Location: right hip with activity Pain Descriptors / Indicators: Sore Pain Intervention(s): Limited activity within patient's tolerance, Monitored during session, Premedicated before session, Repositioned, Ice applied    Home Living                           Prior Function            PT Goals (current goals can now be found in the care plan section) Acute Rehab PT Goals Patient Stated Goal: home today if possible PT Goal Formulation: With patient Time For Goal Achievement: 10/27/22 Potential to Achieve Goals: Good Progress towards PT goals: Progressing toward goals    Frequency    7X/week      PT Plan Current plan remains appropriate    Co-evaluation              AM-PAC PT "6 Clicks" Mobility   Outcome Measure  Help needed turning from your back to your side while in a flat bed without using bedrails?: A Little Help needed moving from lying on your back to sitting on the side of a flat bed without using bedrails?: A Little Help needed moving to and from a bed to a chair (including a wheelchair)?: A Little Help needed standing up from a chair using your arms (e.g., wheelchair or bedside chair)?: A Little Help needed to walk in hospital room?: A Little Help needed climbing 3-5 steps with a railing? : A Little 6 Click Score: 18    End of Session Equipment Utilized During Treatment: Gait belt Activity Tolerance: Patient tolerated treatment well Patient left: in chair;with call bell/phone within reach;with chair alarm set Nurse Communication: Mobility status PT Visit Diagnosis: Other abnormalities of gait and mobility (R26.89);Difficulty in walking, not elsewhere classified (R26.2)     Time: 0981-1914 PT Time Calculation (min) (ACUTE ONLY): 26 min  Charges:    $Gait Training: 8-22 mins $Therapeutic Exercise: 8-22 mins PT General Charges $$ ACUTE PT VISIT: 1 Visit                     Americus Perkey, PT  Acute Rehab Dept [facility]) [phone]  10/20/2022    [facility] 10/20/2022, 3:07 PM

## 2022-10-20 NOTE — Progress Notes (Addendum)
   Subjective: 1 Day Post-Op Procedure(s) (LRB): TOTAL HIP ARTHROPLASTY ANTERIOR APPROACH (Right) Patient reports pain as mild.   Patient seen in rounds by Dr. Charlann Boxer. Patient is resting in bed on exam this morning. Family at bedside. No acute events overnight. Patient has not been up with PT yet. We will start therapy today.   Objective: Vital signs in last 24 hours: Temp:  [97.5 F (36.4 C)-98.7 F (37.1 C)] 98.2 F (36.8 C) (07/31 0601) Pulse Rate:  [46-163] 96 (07/31 0601) Resp:  [10-23] 16 (07/31 0601) BP: (106-167)/(53-104) 157/86 (07/31 0601) SpO2:  [97 %-100 %] 100 % (07/31 0601) Weight:  [77.6 kg] 77.6 kg (07/30 0847)  Intake/Output from previous day:  Intake/Output Summary (Last 24 hours) at 10/20/2022 0734 Last data filed at 10/20/2022 0600 Gross per 24 hour  Intake 2312.78 ml  Output 1450 ml  Net 862.78 ml     Intake/Output this shift: No intake/output data recorded.  Labs: Recent Labs    10/20/22 0358  HGB 10.5*   Recent Labs    10/20/22 0358  WBC 11.5*  RBC 3.42*  HCT 33.3*  PLT 126*   Recent Labs    10/20/22 0358  NA 135  K 4.3  CL 100  CO2 23  BUN 60*  CREATININE 2.74*  GLUCOSE 226*  CALCIUM 8.2*   No results for input(s): "LABPT", "INR" in the last 72 hours.  Exam: General - Patient is Alert and Oriented Extremity - Neurologically intact Sensation intact distally Intact pulses distally Dorsiflexion/Plantar flexion intact Dressing - dressing C/D/I Motor Function - intact, moving foot and toes well on exam.   Past Medical History:  Diagnosis Date   Arthritis    back, knees   Atrial fibrillation (HCC)    CHF (congestive heart failure) (HCC) 08/2018   Diabetes mellitus type II, controlled (HCC)    x 15 yrs   Dysrhythmia    A-fib   Elevated troponin    Hemorrhoids    History of kidney stones    Hypertension    Neuromuscular disorder (HCC)    numbness in hands    Renal insufficiency    stage iv    Assessment/Plan: 1  Day Post-Op Procedure(s) (LRB): TOTAL HIP ARTHROPLASTY ANTERIOR APPROACH (Right) Principal Problem:   S/P total right hip arthroplasty  Estimated body mass index is 30.29 kg/m as calculated from the following:   Height as of this encounter: 5\' 3"  (1.6 m).   Weight as of this encounter: 77.6 kg. Advance diet Up with therapy D/C IV fluids  DVT Prophylaxis -  Eliquis PWB RLE 50% No active abduction  CKD IV - Cr 2.74 around baseline  Hgb stable at 10.5 this AM  Dr. Charlann Boxer discussed with family and patient the intra-op findings and subsequent recommendations for [facility] and avoidance of active abduction.  Will mobilize with PT today.  If she progresses well and feels safe for discharge home, may go today. Otherwise, may take another day to progress.    Rosalene Billings, PA-C Orthopedic Surgery [phone] 10/20/2022, 7:34 AM

## 2022-10-20 NOTE — Plan of Care (Signed)
  Problem: Pain Management: Goal: Pain level will decrease with appropriate interventions Outcome: Progressing   

## 2022-10-20 NOTE — Evaluation (Signed)
Physical Therapy Evaluation Patient Details Name: Miranda Garcia MRN: [id number] DOB: 1930-12-23 Today's Date: 10/20/2022  History of Present Illness  89 yo female presents to therapy s/p R THA, anterior approach on 10/19/2022 due to failure of conservative measures.  in op fracture involving the greater trochanter identified but did not know if it extended distally.  placed a subtrochanteric cable to prevent any potential propagation    Pt is currently R LE 50% WB and no active R hip abduction. Pt PMH includes but is not limited to: L THA, dCHF, hemoptysis, dyspnea, pulmonary nodules, A-fib and chronic anticoagulant therapy, CKD IV, anemia, DM II and back surgery.  Clinical Impression  Pt is s/p THA resulting in the deficits listed below (see PT Problem List).   Pt amb ~ 2' with RW and min/guard assist, cues for [facility] and precautions. Pt is motivated to d/c home after second PT session today  Pt will benefit from acute skilled PT to increase their independence and safety with mobility to facilitate discharge.          If plan is discharge home, recommend the following: A little help with walking and/or transfers;Assistance with cooking/housework;A little help with bathing/dressing/bathroom;Assist for transportation;Help with stairs or ramp for entrance   Can travel by private vehicle        Equipment Recommendations None recommended by PT (RW delivered)  Recommendations for Other Services       Functional Status Assessment Patient has had a recent decline in their functional status and demonstrates the ability to make significant improvements in function in a reasonable and predictable amount of time.     Precautions / Restrictions Precautions Precautions: Fall Restrictions Weight Bearing Restrictions: No RLE Weight Bearing: Weight bearing as tolerated  No active ABDuction R hip     Mobility  Bed Mobility Overal bed mobility: Needs Assistance Bed Mobility: Supine to Sit      Supine to sit: Min assist     General bed mobility comments: cues for precautions, assist with RLE and to elevate trunk, incr time and effort partially related to bedding    Transfers Overall transfer level: Needs assistance Equipment used: Rolling walker (2 wheels) Transfers: Sit to/from Stand, Bed to chair/wheelchair/BSC Sit to Stand: Min assist   Step pivot transfers: Min guard, Min assist       General transfer comment: cues for hand placement and RLE position    Ambulation/Gait Ambulation/Gait assistance: Min guard Gait Distance (Feet): 70 Feet Assistive device: Rolling walker (2 wheels) Gait Pattern/deviations: Step-to pattern       General Gait Details: cues for sequence, PWB RLE, RW position  Stairs            Wheelchair Mobility     Tilt Bed    Modified Rankin (Stroke Patients Only)       Balance Overall balance assessment: Needs assistance Sitting-balance support: Feet supported, No upper extremity supported Sitting balance-Leahy Scale: Good       Standing balance-Leahy Scale: Fair Standing balance comment: pt is able to manipulate LB garment with supervision, no UE support no LOB                             Pertinent Vitals/Pain Pain Assessment Pain Assessment: Faces Faces Pain Scale: Hurts a little bit Pain Location: right hip with activity Pain Descriptors / Indicators: Sore Pain Intervention(s): Limited activity within patient's tolerance, Monitored during session, Premedicated before session, Repositioned, Ice applied  Home Living Family/patient expects to be discharged to:: Private residence Living Arrangements: Children Available Help at Discharge: Family Type of Home: House Home Access: Stairs to enter Entrance Stairs-Rails: Right;Left;Can reach both Entrance Stairs-Number of Steps: 4   Home Layout: One level Home Equipment: BSC/3in1;Hospital bed      Prior Function Prior Level of Function :  Independent/Modified Independent                     Hand Dominance        Extremity/Trunk Assessment   Upper Extremity Assessment Upper Extremity Assessment: Overall WFL for tasks assessed    Lower Extremity Assessment Lower Extremity Assessment: RLE deficits/detail RLE Deficits / Details: ankle WFL, knee grossly WFL, hip grossly 2+/5 limited with testing by pain and precautions       Communication   Communication: No difficulties  Cognition Arousal/Alertness: Awake/alert Behavior During Therapy: WFL for tasks assessed/performed Overall Cognitive Status: Within Functional Limits for tasks assessed                                          General Comments      Exercises Total Joint Exercises Ankle Circles/Pumps: AROM, Both, 10 reps   Assessment/Plan    PT Assessment Patient needs continued PT services  PT Problem List Decreased strength;Decreased activity tolerance;Decreased mobility;Decreased knowledge of precautions;Decreased balance       PT Treatment Interventions DME instruction;Therapeutic exercise;Gait training;Functional mobility training;Therapeutic activities;Patient/family education;Stair training    PT Goals (Current goals can be found in the Care Plan section)  Acute Rehab PT Goals Patient Stated Goal: home today if possible PT Goal Formulation: With patient Time For Goal Achievement: 10/27/22 Potential to Achieve Goals: Good    Frequency 7X/week     Co-evaluation               AM-PAC PT "6 Clicks" Mobility  Outcome Measure Help needed turning from your back to your side while in a flat bed without using bedrails?: A Little Help needed moving from lying on your back to sitting on the side of a flat bed without using bedrails?: A Little Help needed moving to and from a bed to a chair (including a wheelchair)?: A Little Help needed standing up from a chair using your arms (e.g., wheelchair or bedside chair)?: A  Little Help needed to walk in hospital room?: A Little Help needed climbing 3-5 steps with a railing? : A Little 6 Click Score: 18    End of Session Equipment Utilized During Treatment: Gait belt Activity Tolerance: Patient tolerated treatment well Patient left: in chair;with call bell/phone within reach;with chair alarm set Nurse Communication: Mobility status PT Visit Diagnosis: Other abnormalities of gait and mobility (R26.89);Difficulty in walking, not elsewhere classified (R26.2)    Time: 1040-1104 PT Time Calculation (min) (ACUTE ONLY): 24 min   Charges:   PT Evaluation $PT Eval Low Complexity: 1 Low PT Treatments $Gait Training: 8-22 mins PT General Charges $$ ACUTE PT VISIT: 1 Visit         Michaell Grider, PT  Acute Rehab Dept [facility]) [phone]  10/20/2022   [facility] 10/20/2022, 11:13 AM

## 2022-10-20 NOTE — TOC Transition Note (Signed)
Transition of Care [facility]) - CM/SW Discharge Note  Patient Details  Name: Miranda Garcia MRN: [id number] Date of Birth: September 24, 1930  Transition of Care [facility]) CM/SW Contact:  Ewing Schlein, LCSW Phone Number: 10/20/2022, 10:07 AM  Clinical Narrative: Patient is expected to discharge home after working with PT. CSW met with patient to confirm discharge plan and needs. Patient will go home with a home exercise program (HEP). Patient will need a rolling walker, which [employer] delivered to patient's room. TOC signing off.  Final next level of care: Home/Self Care Barriers to Discharge: No Barriers Identified  Patient Goals and CMS Choice CMS Medicare.gov Compare Post Acute Care list provided to:: Patient Choice offered to / list presented to : Patient  Discharge Plan and Services Additional resources added to the After Visit Summary for           DME Arranged: Walker rolling DME Agency: [employer] Representative spoke with at DME Agency: Prearranged in orthopedist's office HH Arranged: NA HH Agency: NA  Social Determinants of Health (SDOH) Interventions SDOH Screenings   Food Insecurity: No Food Insecurity (10/19/2022)  Housing: Low Risk  (10/19/2022)  Transportation Needs: No Transportation Needs (10/19/2022)  Utilities: Not At Risk (10/19/2022)  Tobacco Use: Low Risk  (10/19/2022)   Readmission Risk Interventions     No data to display

## 2022-10-24 ENCOUNTER — Encounter (HOSPITAL_COMMUNITY): Payer: Self-pay | Admitting: Orthopedic Surgery

## 2022-10-25 DIAGNOSIS — M1611 Unilateral primary osteoarthritis, right hip: Secondary | ICD-10-CM | POA: Diagnosis not present

## 2022-10-25 DIAGNOSIS — Z09 Encounter for follow-up examination after completed treatment for conditions other than malignant neoplasm: Secondary | ICD-10-CM | POA: Diagnosis not present

## 2022-10-25 DIAGNOSIS — Z96641 Presence of right artificial hip joint: Secondary | ICD-10-CM | POA: Diagnosis not present

## 2022-10-26 NOTE — Discharge Summary (Signed)
Patient ID: ESPERANZA DOMINY MRN: [id number] DOB/AGE: 08-28-1930 89 y.o.  Admit date: 10/19/2022 Discharge date: 10/20/2022  Admission Diagnoses:  Right hip osteoarthritis  Discharge Diagnoses:  Principal Problem:   S/P total right hip arthroplasty   Past Medical History:  Diagnosis Date   Arthritis    back, knees   Atrial fibrillation (HCC)    CHF (congestive heart failure) (HCC) 08/2018   Diabetes mellitus type II, controlled (HCC)    x 15 yrs   Dysrhythmia    A-fib   Elevated troponin    Hemorrhoids    History of kidney stones    Hypertension    Neuromuscular disorder (HCC)    numbness in hands    Renal insufficiency    stage iv    Surgeries: Procedure(s): TOTAL HIP ARTHROPLASTY ANTERIOR APPROACH on 10/19/2022   Consultants:   Discharged Condition: Improved  Hospital Course: JASMYNE SIEMINSKI is an 89 y.o. female who was admitted 10/19/2022 for operative treatment ofS/P total right hip arthroplasty. Patient has severe unremitting pain that affects sleep, daily activities, and work/hobbies. After pre-op clearance the patient was taken to the operating room on 10/19/2022 and underwent  Procedure(s): TOTAL HIP ARTHROPLASTY ANTERIOR APPROACH.    Patient was given perioperative antibiotics:  Anti-infectives (From admission, onward)    Start     Dose/Rate Route Frequency Ordered Stop   10/19/22 1800  ceFAZolin (ANCEF) IVPB 2g/100 mL premix        2 g 200 mL/hr over 30 Minutes Intravenous Every 6 hours 10/19/22 1632 10/19/22 2354   10/19/22 0900  ceFAZolin (ANCEF) IVPB 2g/100 mL premix        2 g 200 mL/hr over 30 Minutes Intravenous On call to O.R. 10/19/22 0847 10/19/22 1112        Patient was given sequential compression devices, early ambulation, and chemoprophylaxis to prevent DVT. Patient worked with PT and was meeting their goals regarding safe ambulation and transfers.  Patient benefited maximally from hospital stay and there were no complications.     Recent vital signs: No data found.   Recent laboratory studies: No results for input(s): "WBC", "HGB", "HCT", "PLT", "NA", "K", "CL", "CO2", "BUN", "CREATININE", "GLUCOSE", "INR", "CALCIUM" in the last 72 hours.  Invalid input(s): "PT", "2"   Discharge Medications:   Allergies as of 10/20/2022       Reactions   Atorvastatin Other (See Comments)   Leg pains   Contrast Media [iodinated Contrast Media]    Pt has 1 full functioning kidney   Penicillins Nausea And Vomiting, Rash   Tolerated Cephalosporin Date: 09/11/19.        Medication List     STOP taking these medications    Ultram 50 MG tablet Generic drug: traMADol       TAKE these medications    acetaminophen 500 MG tablet Commonly known as: TYLENOL Take 500 mg by mouth 2 (two) times daily.   BIOFREEZE EX Apply 1 Application topically daily as needed (pain).   calcium carbonate 500 MG chewable tablet Commonly known as: TUMS - dosed in mg elemental calcium Chew 2 tablets by mouth daily as needed for indigestion or heartburn.   Caltrate 600+D Plus Minerals 600-800 MG-UNIT Tabs Take 1 tablet by mouth daily.   carvedilol 12.5 MG tablet Commonly known as: COREG Take 12.5 mg by mouth 2 (two) times daily.   diltiazem 240 MG 24 hr capsule Commonly known as: CARDIZEM CD Take 240 mg by mouth daily.   Eliquis 2.5 MG  Tabs tablet Generic drug: apixaban TAKE ONE TABLET BY MOUTH TWICE DAILY   furosemide 80 MG tablet Commonly known as: LASIX Take 80 mg by mouth daily.   insulin aspart protamine- aspart (70-30) 100 UNIT/ML injection Commonly known as: NovoLOG Mix 70/30 Inject 0.12 mLs (12 Units total) into the skin 2 (two) times a day. What changed: how much to take   iVIZIA Dry Eyes 0.5 % Soln Generic drug: Povidone (PF) Place 2 drops into both eyes 2 (two) times daily.   methocarbamol 500 MG tablet Commonly known as: ROBAXIN Take 1 tablet (500 mg total) by mouth every 6 (six) hours as needed for muscle  spasms (muscle pain).   ONE TOUCH ULTRA 2 w/Device Kit SMARTSIG:Via Meter   OneTouch Ultra Liqd   OneTouch Ultra test strip Generic drug: glucose blood   oxyCODONE 5 MG immediate release tablet Commonly known as: Oxy IR/ROXICODONE Take 0.5-1 tablets (2.5-5 mg total) by mouth every 4 (four) hours as needed for severe pain.   polyethylene glycol 17 g packet Commonly known as: MIRALAX / GLYCOLAX Take 17 g by mouth 2 (two) times daily.   pravastatin 20 MG tablet Commonly known as: PRAVACHOL TAKE 1 TABLET DAILY IN THE EVENING   PRESERVISION AREDS 2 PO Take 1 tablet by mouth in the morning and at bedtime.   Systane Complete PF 0.6 % Soln Generic drug: Propylene Glycol (PF) Place 1-2 drops into both eyes at bedtime.   Vitamin D 50 MCG (2000 UT) tablet Take 2,000 Units by mouth daily.               Discharge Care Instructions  (From admission, onward)           Start     Ordered   10/20/22 0000  Change dressing       Comments: Maintain surgical dressing until follow up in the clinic. If the edges start to pull up, may reinforce with tape. If the dressing is no longer working, may remove and cover with gauze and tape, but must keep the area dry and clean.  Call with any questions or concerns.   10/20/22 1602            Diagnostic Studies: DG Pelvis Portable  Result Date: 10/19/2022 CLINICAL DATA:  Status post total right hip arthroplasty. EXAM: PORTABLE PELVIS 1-2 VIEWS COMPARISON:  Frontal view of the bilateral hips 09/11/2019 FINDINGS: Frontal view of the bilateral hips. New total right hip arthroplasty with a single cerclage wire around the mid height of the right femoral stem. Expected postoperative subcutaneous air postsurgical changes lateral to the right hip. Redemonstration of mineralized densities overlying the right ischial femoral space, possibly loose bodies. More remote total left hip arthroplasty, similar to prior. No perihardware lucency is seen to  indicate hardware failure or loosening. Moderate bilateral inferior sacroiliac subchondral sclerosis mild joint space narrowing. The pubic symphysis joint space is maintained. No acute fracture or dislocation. IMPRESSION: 1. New total right hip arthroplasty with expected postsurgical changes. No evidence of hardware failure or loosening. 2. More remote total left hip arthroplasty, similar to prior. Electronically Signed   By: Neita Garnet M.D.   On: 10/19/2022 16:51   DG HIP UNILAT WITH PELVIS 1V RIGHT  Result Date: 10/19/2022 CLINICAL DATA:  Right hip surgery.  Intraoperative fluoroscopy. EXAM: DG HIP (WITH OR WITHOUT PELVIS) 1V RIGHT COMPARISON:  AP pelvis 09/11/2019 FINDINGS: Images were performed intraoperatively without the presence of a radiologist. The patient is undergoing total right hip  arthroplasty. A single cerclage wire is seen surrounding the mid height of the right femoral stem. Redemonstration of prior total left hip arthroplasty, partially visualized. No hardware complication is seen. Total fluoroscopy images: 2 Total fluoroscopy time: 11 seconds Total dose: Radiation Exposure Index (as provided by the fluoroscopic device): 1.5 mGy air Kerma Please see intraoperative findings for further detail. IMPRESSION: Intraoperative fluoroscopy for total right hip arthroplasty. Electronically Signed   By: Neita Garnet M.D.   On: 10/19/2022 16:32   DG C-Arm 1-60 Min-No Report  Result Date: 10/19/2022 Fluoroscopy was utilized by the requesting physician.  No radiographic interpretation.   DG C-Arm 1-60 Min-No Report  Result Date: 10/19/2022 Fluoroscopy was utilized by the requesting physician.  No radiographic interpretation.    Disposition: Discharge disposition: 01-Home or Self Care       Discharge Instructions     Call MD / Call 911   Complete by: As directed    If you experience chest pain or shortness of breath, CALL 911 and be transported to the hospital emergency room.  If you  develope a fever above 101 F, pus (white drainage) or increased drainage or redness at the wound, or calf pain, call your surgeon's office.   Change dressing   Complete by: As directed    Maintain surgical dressing until follow up in the clinic. If the edges start to pull up, may reinforce with tape. If the dressing is no longer working, may remove and cover with gauze and tape, but must keep the area dry and clean.  Call with any questions or concerns.   Constipation Prevention   Complete by: As directed    Drink plenty of fluids.  Prune juice may be helpful.  You may use a stool softener, such as Colace (over the counter) 100 mg twice a day.  Use MiraLax (over the counter) for constipation as needed.   Diet - low sodium heart healthy   Complete by: As directed    Post-operative opioid taper instructions:   Complete by: As directed    POST-OPERATIVE OPIOID TAPER INSTRUCTIONS: It is important to wean off of your opioid medication as soon as possible. If you do not need pain medication after your surgery it is ok to stop day one. Opioids include: Codeine, Hydrocodone(Norco, Vicodin), Oxycodone(Percocet, oxycontin) and hydromorphone amongst others.  Long term and even short term use of opiods can cause: Increased pain response Dependence Constipation Depression Respiratory depression And more.  Withdrawal symptoms can include Flu like symptoms Nausea, vomiting And more Techniques to manage these symptoms Hydrate well Eat regular healthy meals Stay active Use relaxation techniques(deep breathing, meditating, yoga) Do Not substitute Alcohol to help with tapering If you have been on opioids for less than two weeks and do not have pain than it is ok to stop all together.  Plan to wean off of opioids This plan should start within one week post op of your joint replacement. Maintain the same interval or time between taking each dose and first decrease the dose.  Cut the total daily intake  of opioids by one tablet each day Next start to increase the time between doses. The last dose that should be eliminated is the evening dose.      TED hose   Complete by: As directed    Use stockings (TED hose) for 2 weeks on both leg(s).  You may remove them at night for sleeping.        Follow-up Information  Cassandria Anger, PA-C. Go on 11/03/2022.   Specialty: Orthopedic Surgery Why: You are scheduled for a follow up appointment on 11-03-22 at 3:30 pm. Contact information: [address] [place] [address] [phone]                  Signed: Cassandria Anger 10/26/2022, 7:21 AM

## 2022-10-29 ENCOUNTER — Ambulatory Visit: Payer: Medicare HMO | Admitting: Cardiology

## 2022-11-03 DIAGNOSIS — Z96641 Presence of right artificial hip joint: Secondary | ICD-10-CM | POA: Diagnosis not present

## 2022-11-03 DIAGNOSIS — Z471 Aftercare following joint replacement surgery: Secondary | ICD-10-CM | POA: Diagnosis not present

## 2022-11-03 DIAGNOSIS — Z4789 Encounter for other orthopedic aftercare: Secondary | ICD-10-CM | POA: Diagnosis not present

## 2022-11-25 DIAGNOSIS — D631 Anemia in chronic kidney disease: Secondary | ICD-10-CM | POA: Diagnosis not present

## 2022-11-25 DIAGNOSIS — I129 Hypertensive chronic kidney disease with stage 1 through stage 4 chronic kidney disease, or unspecified chronic kidney disease: Secondary | ICD-10-CM | POA: Diagnosis not present

## 2022-11-25 DIAGNOSIS — N2581 Secondary hyperparathyroidism of renal origin: Secondary | ICD-10-CM | POA: Diagnosis not present

## 2022-11-25 DIAGNOSIS — N189 Chronic kidney disease, unspecified: Secondary | ICD-10-CM | POA: Diagnosis not present

## 2022-11-25 DIAGNOSIS — Z96641 Presence of right artificial hip joint: Secondary | ICD-10-CM | POA: Diagnosis not present

## 2022-11-25 DIAGNOSIS — E1122 Type 2 diabetes mellitus with diabetic chronic kidney disease: Secondary | ICD-10-CM | POA: Diagnosis not present

## 2022-11-25 DIAGNOSIS — N184 Chronic kidney disease, stage 4 (severe): Secondary | ICD-10-CM | POA: Diagnosis not present

## 2022-12-01 ENCOUNTER — Other Ambulatory Visit: Payer: Self-pay | Admitting: Nephrology

## 2022-12-01 DIAGNOSIS — Z96641 Presence of right artificial hip joint: Secondary | ICD-10-CM

## 2022-12-03 ENCOUNTER — Ambulatory Visit
Admission: RE | Admit: 2022-12-03 | Discharge: 2022-12-03 | Disposition: A | Discharge status: Home / Self Care | Payer: Medicare HMO | Source: Ambulatory Visit | Attending: Nephrology | Admitting: Nephrology

## 2022-12-03 DIAGNOSIS — M79604 Pain in right leg: Secondary | ICD-10-CM | POA: Diagnosis not present

## 2022-12-03 DIAGNOSIS — Z96641 Presence of right artificial hip joint: Secondary | ICD-10-CM

## 2022-12-08 DIAGNOSIS — Z96641 Presence of right artificial hip joint: Secondary | ICD-10-CM | POA: Diagnosis not present

## 2022-12-08 DIAGNOSIS — Z471 Aftercare following joint replacement surgery: Secondary | ICD-10-CM | POA: Diagnosis not present

## 2022-12-14 DIAGNOSIS — Z79899 Other long term (current) drug therapy: Secondary | ICD-10-CM | POA: Diagnosis not present

## 2022-12-14 DIAGNOSIS — E7800 Pure hypercholesterolemia, unspecified: Secondary | ICD-10-CM | POA: Diagnosis not present

## 2022-12-14 DIAGNOSIS — R5383 Other fatigue: Secondary | ICD-10-CM | POA: Diagnosis not present

## 2022-12-30 ENCOUNTER — Ambulatory Visit: Payer: Medicare HMO | Attending: Cardiology | Admitting: Cardiology

## 2022-12-30 VITALS — BP 128/80 | HR 93 | Ht 63.0 in | Wt 170.2 lb

## 2022-12-30 DIAGNOSIS — I10 Essential (primary) hypertension: Secondary | ICD-10-CM

## 2022-12-30 DIAGNOSIS — I350 Nonrheumatic aortic (valve) stenosis: Secondary | ICD-10-CM | POA: Diagnosis not present

## 2022-12-30 DIAGNOSIS — I5032 Chronic diastolic (congestive) heart failure: Secondary | ICD-10-CM | POA: Diagnosis not present

## 2022-12-30 DIAGNOSIS — E782 Mixed hyperlipidemia: Secondary | ICD-10-CM

## 2022-12-30 DIAGNOSIS — I4891 Unspecified atrial fibrillation: Secondary | ICD-10-CM

## 2022-12-30 NOTE — Progress Notes (Signed)
Clinical Summary Miranda Garcia is a 89 y.o.female seen today for follow up of the following medical problems.    1. Chronic diastolic HF  - working to limit diuretic due to her renal dysfunction - no recent SOB/DOE. No recent edema - taking lasix 80mg  oral daily.  - GFR <20,no SGLT2i   2. CKD IV - followed by nephorlogy Dr Signe Colt     3. Aortic stenosis - 09/2021 echo: LVEF 65-70%, mild to moderate AS mean grad 11.8, AVA VTI 1.26, DI 0.45 - denies any symptoms     3. Dyspnea - extensive pulmonmary and cardiac workup over the last few months.   Jan 2020 PFTs suggestive of restriction, mildly redcued DLCO but corrects for alveolar ventilation  04/2018 echo: LVEF 60-65%, indeterminate diastolic function, reported moderate aortic stenosis - 05/2018 nuclear stress: no ischemia 05/2018 CT chest high resolution: findings suggesting of interstitial lung disease vs alveolar hemorrage.  05/2018 VQ scan: low probability PE, Patchy retention of the ventilation agent centrally may be seen with air trapping or COPD.  - no recent symptoms     4. HTN - compliant with meds     5. Afib --no recent palpitations - no bleeding on eliquis.    6. Hyperlipidemia - she is on pravastatin, labs followed by pcp - leg pains on atorvastaitn.  02/2021 TC 107 TG 100 HDL 51 LDL 37 - 11/2022 TC 191 TG 478 HDL 51 LDL 32    7. Right hip replacement 10/19/22 - has done well, completed rehab  8. Carotid stenosis - 09/2021 echo: 1-39% bilateral disease Past Medical History:  Diagnosis Date   Arthritis    back, knees   Atrial fibrillation (HCC)    CHF (congestive heart failure) (HCC) 08/2018   Diabetes mellitus type II, controlled (HCC)    x 15 yrs   Dysrhythmia    A-fib   Elevated troponin    Hemorrhoids    History of kidney stones    Hypertension    Neuromuscular disorder (HCC)    numbness in hands    Renal insufficiency    stage iv     Allergies  Allergen Reactions    Atorvastatin Other (See Comments)    Leg pains   Contrast Media [Iodinated Contrast Media]     Pt has 1 full functioning kidney   Penicillins Nausea And Vomiting and Rash    Tolerated Cephalosporin Date: 09/11/19.     Current Outpatient Medications  Medication Sig Dispense Refill   acetaminophen (TYLENOL) 500 MG tablet Take 500 mg by mouth 2 (two) times daily.     Blood Glucose Calibration (ONETOUCH ULTRA) LIQD      Blood Glucose Monitoring Suppl (ONE TOUCH ULTRA 2) w/Device KIT SMARTSIG:Via Meter     calcium carbonate (TUMS - DOSED IN MG ELEMENTAL CALCIUM) 500 MG chewable tablet Chew 2 tablets by mouth daily as needed for indigestion or heartburn.     Calcium Carbonate-Vit D-Min (CALTRATE 600+D PLUS MINERALS) 600-800 MG-UNIT TABS Take 1 tablet by mouth daily.     carvedilol (COREG) 12.5 MG tablet Take 12.5 mg by mouth 2 (two) times daily.     Cholecalciferol (VITAMIN D) 50 MCG (2000 UT) tablet Take 2,000 Units by mouth daily.     diltiazem (CARDIZEM CD) 240 MG 24 hr capsule Take 240 mg by mouth daily.     ELIQUIS 2.5 MG TABS tablet TAKE ONE TABLET BY MOUTH TWICE DAILY 60 tablet 5   furosemide (LASIX) 80  MG tablet Take 80 mg by mouth daily.     insulin aspart protamine- aspart (NOVOLOG MIX 70/30) (70-30) 100 UNIT/ML injection Inject 0.12 mLs (12 Units total) into the skin 2 (two) times a day. (Patient taking differently: Inject 12-16 Units into the skin 2 (two) times a day.) 10 mL 11   Menthol, Topical Analgesic, (BIOFREEZE EX) Apply 1 Application topically daily as needed (pain).     methocarbamol (ROBAXIN) 500 MG tablet Take 1 tablet (500 mg total) by mouth every 6 (six) hours as needed for muscle spasms (muscle pain). 40 tablet 2   Multiple Vitamins-Minerals (PRESERVISION AREDS 2 PO) Take 1 tablet by mouth in the morning and at bedtime.     ONETOUCH ULTRA test strip      oxyCODONE (OXY IR/ROXICODONE) 5 MG immediate release tablet Take 0.5-1 tablets (2.5-5 mg total) by mouth every 4  (four) hours as needed for severe pain. 30 tablet 0   polyethylene glycol (MIRALAX / GLYCOLAX) 17 g packet Take 17 g by mouth 2 (two) times daily. 14 each 0   Povidone, PF, (IVIZIA DRY EYES) 0.5 % SOLN Place 2 drops into both eyes 2 (two) times daily.     pravastatin (PRAVACHOL) 20 MG tablet TAKE 1 TABLET DAILY IN THE EVENING 90 tablet 2   Propylene Glycol, PF, (SYSTANE COMPLETE PF) 0.6 % SOLN Place 1-2 drops into both eyes at bedtime.     No current facility-administered medications for this visit.   Facility-Administered Medications Ordered in Other Visits  Medication Dose Route Frequency Provider Last Rate Last Admin   neomycin-polymyxin-dexameth (MAXITROL) 0.1 % ophth ointment    PRN Gemma Payor, MD   1 application  at 01/21/11 1427     Past Surgical History:  Procedure Laterality Date   BACK SURGERY  1997   [facility]   CATARACT EXTRACTION [facility]  01/11/2011   Procedure: CATARACT EXTRACTION PHACO AND INTRAOCULAR LENS PLACEMENT (IOC);  Surgeon: Gemma Payor;  Location: [facility] ORS;  Service: Ophthalmology;  Laterality: Right;  CDE 19.32   CATARACT EXTRACTION W/PHACO  01/21/2011   Procedure: CATARACT EXTRACTION PHACO AND INTRAOCULAR LENS PLACEMENT (IOC);  Surgeon: Gemma Payor;  Location: [facility];  Service: Ophthalmology;  Laterality: Left;  CDE: 21.19   COLONOSCOPY N/A 09/12/2013   Procedure: COLONOSCOPY;  Surgeon: Malissa Hippo, MD;  Location: [facility] SUITE;  Service: Endoscopy;  Laterality: N/A;  125   CYSTOSCOPY     [place]   LITHOTRIPSY     [facility]   PARTIAL HYSTERECTOMY     TOTAL HIP ARTHROPLASTY Left 09/11/2019   Procedure: TOTAL HIP ARTHROPLASTY ANTERIOR APPROACH;  Surgeon: Durene Romans, MD;  Location: [facility];  Service: Orthopedics;  Laterality: Left;  70 mins   TOTAL HIP ARTHROPLASTY Right 10/19/2022   Procedure: TOTAL HIP ARTHROPLASTY ANTERIOR APPROACH;  Surgeon: Durene Romans, MD;  Location: [facility] OR[facility];  Service: Orthopedics;  Laterality: Right;     Allergies  Allergen Reactions    Atorvastatin Other (See Comments)    Leg pains   Contrast Media [Iodinated Contrast Media]     Pt has 1 full functioning kidney   Penicillins Nausea And Vomiting and Rash    Tolerated Cephalosporin Date: 09/11/19.      Family History  Problem Relation Age of Onset   Colon cancer Brother      Social History Ms. Brandle reports that she has never smoked. She has never used smokeless tobacco. Ms. Revard reports no history of alcohol use.   Review of  Systems CONSTITUTIONAL: No weight loss, fever, chills, weakness or fatigue.  HEENT: Eyes: No visual loss, blurred vision, double vision or yellow sclerae.No hearing loss, sneezing, congestion, runny nose or sore throat.  SKIN: No rash or itching.  CARDIOVASCULAR: per hpi RESPIRATORY: No shortness of breath, cough or sputum.  GASTROINTESTINAL: No anorexia, nausea, vomiting or diarrhea. No abdominal pain or blood.  GENITOURINARY: No burning on urination, no polyuria NEUROLOGICAL: No headache, dizziness, syncope, paralysis, ataxia, numbness or tingling in the extremities. No change in bowel or bladder control.  MUSCULOSKELETAL: No muscle, back pain, joint pain or stiffness.  LYMPHATICS: No enlarged nodes. No history of splenectomy.  PSYCHIATRIC: No history of depression or anxiety.  ENDOCRINOLOGIC: No reports of sweating, cold or heat intolerance. No polyuria or polydipsia.  Marland Kitchen   Physical Examination Today's Vitals   12/30/22 1025  BP: 128/80  Pulse: 93  SpO2: 94%  Weight: 170 lb 3.2 oz (77.2 kg)  Height: 5\' 3"  (1.6 m)   Body mass index is 30.15 kg/m.  Gen: resting comfortably, no acute distress HEENT: no scleral icterus, pupils equal round and reactive, no palptable cervical adenopathy,  CV: irreg, 2/6 systolic murmur rusb, no jvd Resp: Clear to auscultation bilaterally GI: abdomen is soft, non-tender, non-distended, normal bowel sounds, no hepatosplenomegaly MSK: extremities are warm, no edema.  Skin: warm, no  rash Neuro:  no focal deficits Psych: appropriate affect   Diagnostic Studies  04/2019 echo IMPRESSIONS     1. Left ventricular ejection fraction, by estimation, is 60 to 65%. The  left ventricle has normal function. The left ventricle has no regional  wall motion abnormalities. There is mild concentric left ventricular  hypertrophy. Left ventricular diastolic  parameters are indeterminate. Elevated left ventricular end-diastolic  pressure.   2. Right ventricular systolic function is low normal. The right  ventricular size is normal. There is moderately elevated pulmonary artery  systolic pressure.   3. Left atrial size was severely dilated.   4. Right atrial size was mildly dilated.   5. The mitral valve is degenerative. Moderate mitral valve regurgitation.   6. Tricuspid valve regurgitation is moderate.   7. Aortic valve is functionally bicuspid. Morphologically, there appears  to be moderate calcific aortic stenosis but peak velocities and gradients  are lower (in mild range). The aortic valve is abnormal. Aortic valve  regurgitation is not visualized.  Mild to moderate aortic valve stenosis. Aortic valve area, by VTI measures  1.10 cm. Aortic valve mean gradient measures 11.5 mmHg. Aortic valve Vmax  measures 2.22 m/s.   8. The inferior vena cava is normal in size with <50% respiratory  variability, suggesting right atrial pressure of 8 mmHg.     08/2020 echo LVEF 65-70%, indet diastolic fxn, normal RV, severe LAE, small to mod pericardial effusion, mild to mod MR, mod to severe AS   09/2021 echo 1. Left ventricular ejection fraction, by estimation, is 65 to 70%. The  left ventricle has normal function. The left ventricle has no regional  wall motion abnormalities. There is moderate concentric left ventricular  hypertrophy. Left ventricular  diastolic parameters are indeterminate.   2. Right ventricular systolic function is normal. The right ventricular  size is normal.  There is mildly elevated pulmonary artery systolic  pressure. The estimated right ventricular systolic pressure is 38.0 mmHg.   3. Left atrial size was severely dilated.   4. A small to moderate pericardial effusion is present. The pericardial  effusion is posterior to the left ventricle.  5. The mitral valve is abnormal. Mild mitral valve regurgitation. Severe  mitral annular calcification.   6. The aortic valve is tricuspid. There is moderate calcification of the  aortic valve. Aortic valve regurgitation is not visualized. Moderate  aortic valve stenosis. Aortic valve area, by VTI measures 1.26 cm. Aortic  valve mean gradient measures 11.8  mmHg. Aortic valve Vmax measures 2.23 m/s. Dimentionless index 0.45.   7. The inferior vena cava is normal in size with greater than 50%  respiratory variability, suggesting right atrial pressure of 3 mmHg.    09/2021 carotid US Summary:  Right Carotid: Velocities in the right ICA are consistent with a 1-39%  stenosis.   Left Carotid: Velocities in the left ICA are consistent with a 1-39%  stenosis.               Non-hemodynamically significant plaque <50% noted in the  CCA.   Vertebrals: Bilateral vertebral arteries demonstrate antegrade flow.  Subclavians: Normal flow hemodynamics were seen in bilateral subclavian               arteries.    Assessment and Plan   1. Chronic diastolic HF - euvolemic today, continue lasix at current dosing.    2. Aortic stenosis -mild to modrate by most recent echo - due for repeat echo for ongoing surveillance, will order     3. Afib/acquired thrombophlia -no symptoms, continue current meds - low dose eliquis based on age and kidney function   4. HTN - she is at goal, continue current meds  5. HLD - at goal, continue current meds   Antoine Poche, M.D.

## 2022-12-30 NOTE — Patient Instructions (Addendum)
Medication Instructions:  Continue all current medications.  Labwork: none  Testing/Procedures: Your physician has requested that you have an echocardiogram. Echocardiography is a painless test that uses sound waves to create images of your heart. It provides your doctor with information about the size and shape of your heart and how well your heart's chambers and valves are working. This procedure takes approximately one hour. There are no restrictions for this procedure. Please do NOT wear cologne, perfume, aftershave, or lotions (deodorant is allowed). Please arrive 15 minutes prior to your appointment time. Office will contact with results via phone, letter or mychart.     Follow-Up: 6 months   Any Other Special Instructions Will Be Listed Below (If Applicable).   If you need a refill on your cardiac medications before your next appointment, please call your pharmacy.  

## 2023-01-12 ENCOUNTER — Ambulatory Visit: Payer: Medicare HMO | Attending: Cardiology

## 2023-01-12 DIAGNOSIS — I350 Nonrheumatic aortic (valve) stenosis: Secondary | ICD-10-CM | POA: Diagnosis not present

## 2023-01-13 LAB — ECHOCARDIOGRAM COMPLETE
AR max vel: 1.09 cm2
AV Area VTI: 1.15 cm2
AV Area mean vel: 1.08 cm2
AV Mean grad: 12 mm[Hg]
AV Peak grad: 20.7 mm[Hg]
Ao pk vel: 2.28 m/s
Area-P 1/2: 3.19 cm2
Calc EF: 61 %
MV VTI: 1.51 cm2
S' Lateral: 3.8 cm
Single Plane A2C EF: 69.6 %
Single Plane A4C EF: 53.1 %

## 2023-01-19 DIAGNOSIS — Z96641 Presence of right artificial hip joint: Secondary | ICD-10-CM | POA: Diagnosis not present

## 2023-01-19 DIAGNOSIS — Z471 Aftercare following joint replacement surgery: Secondary | ICD-10-CM | POA: Diagnosis not present

## 2023-01-25 ENCOUNTER — Telehealth: Payer: Self-pay | Admitting: Cardiology

## 2023-01-25 NOTE — Telephone Encounter (Signed)
Patient is calling to get her results °

## 2023-01-27 NOTE — Telephone Encounter (Signed)
Patient notified and verbalized understanding. Patient had no questions or concerns at this time. PCP copied 

## 2023-01-27 NOTE — Telephone Encounter (Signed)
Echo shows heart pumping function is normal. Evidence of some age related stiffness of the heart muscle which is common finding. Aortic valve is mild to moderately stiffened, just somehting to continue to monitor at this time   Dominga Ferry MD

## 2023-02-10 DIAGNOSIS — H353211 Exudative age-related macular degeneration, right eye, with active choroidal neovascularization: Secondary | ICD-10-CM | POA: Diagnosis not present

## 2023-02-10 DIAGNOSIS — H353122 Nonexudative age-related macular degeneration, left eye, intermediate dry stage: Secondary | ICD-10-CM | POA: Diagnosis not present

## 2023-02-10 DIAGNOSIS — H43392 Other vitreous opacities, left eye: Secondary | ICD-10-CM | POA: Diagnosis not present

## 2023-02-10 DIAGNOSIS — H43812 Vitreous degeneration, left eye: Secondary | ICD-10-CM | POA: Diagnosis not present

## 2023-02-14 DIAGNOSIS — M5416 Radiculopathy, lumbar region: Secondary | ICD-10-CM | POA: Diagnosis not present

## 2023-02-14 DIAGNOSIS — M5451 Vertebrogenic low back pain: Secondary | ICD-10-CM | POA: Diagnosis not present

## 2023-02-14 DIAGNOSIS — M51362 Other intervertebral disc degeneration, lumbar region with discogenic back pain and lower extremity pain: Secondary | ICD-10-CM | POA: Diagnosis not present

## 2023-02-14 DIAGNOSIS — N184 Chronic kidney disease, stage 4 (severe): Secondary | ICD-10-CM | POA: Diagnosis not present

## 2023-02-14 DIAGNOSIS — Z96649 Presence of unspecified artificial hip joint: Secondary | ICD-10-CM | POA: Diagnosis not present

## 2023-02-24 DIAGNOSIS — H353211 Exudative age-related macular degeneration, right eye, with active choroidal neovascularization: Secondary | ICD-10-CM | POA: Diagnosis not present

## 2023-03-19 ENCOUNTER — Other Ambulatory Visit: Payer: Self-pay | Admitting: Cardiology

## 2023-03-19 DIAGNOSIS — I482 Chronic atrial fibrillation, unspecified: Secondary | ICD-10-CM

## 2023-03-21 NOTE — Telephone Encounter (Signed)
Pt last saw Dr Wyline Mood 12/30/22, last labs 10/20/22 Creat 2.74, age 89, weight 77.2kg, based on specified criteria pt is on appropriate dosage of Eliquis 2.5mg  BID for afib.  Will refill rx.

## 2023-04-07 DIAGNOSIS — H353211 Exudative age-related macular degeneration, right eye, with active choroidal neovascularization: Secondary | ICD-10-CM | POA: Diagnosis not present

## 2023-04-07 DIAGNOSIS — H43392 Other vitreous opacities, left eye: Secondary | ICD-10-CM | POA: Diagnosis not present

## 2023-04-07 DIAGNOSIS — H43812 Vitreous degeneration, left eye: Secondary | ICD-10-CM | POA: Diagnosis not present

## 2023-04-07 DIAGNOSIS — H353122 Nonexudative age-related macular degeneration, left eye, intermediate dry stage: Secondary | ICD-10-CM | POA: Diagnosis not present

## 2023-04-12 ENCOUNTER — Other Ambulatory Visit: Payer: Self-pay | Admitting: Cardiology

## 2023-04-27 DIAGNOSIS — D631 Anemia in chronic kidney disease: Secondary | ICD-10-CM | POA: Diagnosis not present

## 2023-04-27 DIAGNOSIS — I129 Hypertensive chronic kidney disease with stage 1 through stage 4 chronic kidney disease, or unspecified chronic kidney disease: Secondary | ICD-10-CM | POA: Diagnosis not present

## 2023-04-27 DIAGNOSIS — N189 Chronic kidney disease, unspecified: Secondary | ICD-10-CM | POA: Diagnosis not present

## 2023-04-27 DIAGNOSIS — N2581 Secondary hyperparathyroidism of renal origin: Secondary | ICD-10-CM | POA: Diagnosis not present

## 2023-04-27 DIAGNOSIS — N184 Chronic kidney disease, stage 4 (severe): Secondary | ICD-10-CM | POA: Diagnosis not present

## 2023-04-27 DIAGNOSIS — E1122 Type 2 diabetes mellitus with diabetic chronic kidney disease: Secondary | ICD-10-CM | POA: Diagnosis not present

## 2023-05-16 DIAGNOSIS — H43812 Vitreous degeneration, left eye: Secondary | ICD-10-CM | POA: Diagnosis not present

## 2023-05-16 DIAGNOSIS — H353122 Nonexudative age-related macular degeneration, left eye, intermediate dry stage: Secondary | ICD-10-CM | POA: Diagnosis not present

## 2023-05-16 DIAGNOSIS — H353211 Exudative age-related macular degeneration, right eye, with active choroidal neovascularization: Secondary | ICD-10-CM | POA: Diagnosis not present

## 2023-05-16 DIAGNOSIS — H43392 Other vitreous opacities, left eye: Secondary | ICD-10-CM | POA: Diagnosis not present

## 2023-06-14 DIAGNOSIS — Z79899 Other long term (current) drug therapy: Secondary | ICD-10-CM | POA: Diagnosis not present

## 2023-06-14 DIAGNOSIS — M5416 Radiculopathy, lumbar region: Secondary | ICD-10-CM | POA: Diagnosis not present

## 2023-06-14 DIAGNOSIS — Z96649 Presence of unspecified artificial hip joint: Secondary | ICD-10-CM | POA: Diagnosis not present

## 2023-06-14 DIAGNOSIS — M5451 Vertebrogenic low back pain: Secondary | ICD-10-CM | POA: Diagnosis not present

## 2023-06-14 DIAGNOSIS — N184 Chronic kidney disease, stage 4 (severe): Secondary | ICD-10-CM | POA: Diagnosis not present

## 2023-06-14 DIAGNOSIS — M51362 Other intervertebral disc degeneration, lumbar region with discogenic back pain and lower extremity pain: Secondary | ICD-10-CM | POA: Diagnosis not present

## 2023-06-14 DIAGNOSIS — Z5181 Encounter for therapeutic drug level monitoring: Secondary | ICD-10-CM | POA: Diagnosis not present

## 2023-06-23 DIAGNOSIS — H353211 Exudative age-related macular degeneration, right eye, with active choroidal neovascularization: Secondary | ICD-10-CM | POA: Diagnosis not present

## 2023-07-05 DIAGNOSIS — D485 Neoplasm of uncertain behavior of skin: Secondary | ICD-10-CM | POA: Diagnosis not present

## 2023-07-05 DIAGNOSIS — L570 Actinic keratosis: Secondary | ICD-10-CM | POA: Diagnosis not present

## 2023-07-05 DIAGNOSIS — L814 Other melanin hyperpigmentation: Secondary | ICD-10-CM | POA: Diagnosis not present

## 2023-07-05 DIAGNOSIS — L821 Other seborrheic keratosis: Secondary | ICD-10-CM | POA: Diagnosis not present

## 2023-08-03 DIAGNOSIS — H0011 Chalazion right upper eyelid: Secondary | ICD-10-CM | POA: Diagnosis not present

## 2023-08-03 DIAGNOSIS — H43392 Other vitreous opacities, left eye: Secondary | ICD-10-CM | POA: Diagnosis not present

## 2023-08-03 DIAGNOSIS — H353211 Exudative age-related macular degeneration, right eye, with active choroidal neovascularization: Secondary | ICD-10-CM | POA: Diagnosis not present

## 2023-08-03 DIAGNOSIS — H353122 Nonexudative age-related macular degeneration, left eye, intermediate dry stage: Secondary | ICD-10-CM | POA: Diagnosis not present

## 2023-08-03 DIAGNOSIS — H43812 Vitreous degeneration, left eye: Secondary | ICD-10-CM | POA: Diagnosis not present

## 2023-08-04 DIAGNOSIS — C44329 Squamous cell carcinoma of skin of other parts of face: Secondary | ICD-10-CM | POA: Diagnosis not present

## 2023-09-08 ENCOUNTER — Ambulatory Visit: Payer: Medicare HMO | Attending: Cardiology | Admitting: Cardiology

## 2023-09-08 ENCOUNTER — Encounter: Payer: Self-pay | Admitting: Cardiology

## 2023-09-08 VITALS — BP 122/66 | HR 65 | Ht 63.0 in | Wt 166.0 lb

## 2023-09-08 DIAGNOSIS — I10 Essential (primary) hypertension: Secondary | ICD-10-CM

## 2023-09-08 DIAGNOSIS — I350 Nonrheumatic aortic (valve) stenosis: Secondary | ICD-10-CM

## 2023-09-08 DIAGNOSIS — I4891 Unspecified atrial fibrillation: Secondary | ICD-10-CM

## 2023-09-08 DIAGNOSIS — D6869 Other thrombophilia: Secondary | ICD-10-CM | POA: Diagnosis not present

## 2023-09-08 DIAGNOSIS — I5032 Chronic diastolic (congestive) heart failure: Secondary | ICD-10-CM

## 2023-09-08 DIAGNOSIS — E782 Mixed hyperlipidemia: Secondary | ICD-10-CM

## 2023-09-08 MED ORDER — INSULIN ASPART PROT & ASPART (70-30 MIX) 100 UNIT/ML ~~LOC~~ SUSP: 6.0000 [IU] | Freq: Two times a day (BID) | SUBCUTANEOUS | Status: AC

## 2023-09-08 NOTE — Progress Notes (Signed)
 Clinical Summary Miranda Garcia is a 89 y.o.female seen today for follow up of the following medical problems.    1. Chronic diastolic HF  - working to limit diuretic due to her renal dysfunction - taking lasix  80mg  oral daily.  - GFR <20,no SGLT2i  - no recent edema, no SOB/DOE - compliant with meds   2. CKD IV - followed by nephorlogy Dr Christianne Cowper     3. Aortic stenosis - 09/2021 echo: LVEF 65-70%, mild to moderate AS mean grad 11.8, AVA VTI 1.26, DI 0.45  12/2022 echo: LVEF 60-65%, no WMAs, indet diastolic fxn, mild MR, mild MS, mild to mod AS mean grad 12, AVA VTI 1.15, DI 0.31 SVI 32       3. Dyspnea - extensive pulmonmary and cardiac workup over the last few months.   Jan 2020 PFTs suggestive of restriction, mildly redcued DLCO but corrects for alveolar ventilation  04/2018 echo: LVEF 60-65%, indeterminate diastolic function, reported moderate aortic stenosis - 05/2018 nuclear stress: no ischemia 05/2018 CT chest high resolution: findings suggesting of interstitial lung disease vs alveolar hemorrage.  05/2018 VQ scan: low probability PE, Patchy retention of the ventilation agent centrally may be seen with air trapping or COPD.   - no recent symptoms     4. HTN - compliant with meds     5. Afib --no palpitations - no bleeding on eliquis .      6. Hyperlipidemia - she is on pravastatin , labs followed by pcp - leg pains on atorvastaitn.  02/2021 TC 107 TG 100 HDL 51 LDL 37 - 11/2022 TC 409 TG 811 HDL 51 LDL 32     7. Right hip replacement 10/19/22 - has done well, completed rehab   8. Carotid stenosis - 09/2021 echo: 1-39% bilateral disease Past Medical History:  Diagnosis Date   Arthritis    back, knees   Atrial fibrillation (HCC)    CHF (congestive heart failure) (HCC) 08/2018   Diabetes mellitus type II, controlled (HCC)    x 15 yrs   Dysrhythmia    A-fib   Elevated troponin    Hemorrhoids    History of kidney stones    Hypertension     Neuromuscular disorder (HCC)    numbness in hands    Renal insufficiency    stage iv     Allergies  Allergen Reactions   Atorvastatin  Other (See Comments)    Leg pains   Contrast Media [Iodinated Contrast Media]     Pt has 1 full functioning kidney   Penicillins Nausea And Vomiting and Rash    Tolerated Cephalosporin Date: 09/11/19.     Current Outpatient Medications  Medication Sig Dispense Refill   acetaminophen  (TYLENOL ) 500 MG tablet Take 500 mg by mouth 2 (two) times daily.     Blood Glucose Calibration (ONETOUCH ULTRA) LIQD      Blood Glucose Monitoring Suppl (ONE TOUCH ULTRA 2) w/Device KIT SMARTSIG:Via Meter     calcium  carbonate (TUMS - DOSED IN MG ELEMENTAL CALCIUM ) 500 MG chewable tablet Chew 2 tablets by mouth daily as needed for indigestion or heartburn.     Calcium  Carbonate-Vit D-Min (CALTRATE 600+D PLUS MINERALS) 600-800 MG-UNIT TABS Take 1 tablet by mouth daily.     carvedilol  (COREG ) 12.5 MG tablet Take 12.5 mg by mouth 2 (two) times daily.     Cholecalciferol (VITAMIN D) 50 MCG (2000 UT) tablet Take 2,000 Units by mouth daily.     diltiazem  (CARDIZEM  CD) 240  MG 24 hr capsule Take 240 mg by mouth daily.     ELIQUIS  2.5 MG TABS tablet TAKE ONE TABLET BY MOUTH TWICE DAILY 60 tablet 5   furosemide  (LASIX ) 80 MG tablet Take 80 mg by mouth daily.     insulin  aspart protamine- aspart (NOVOLOG  MIX 70/30) (70-30) 100 UNIT/ML injection Inject 0.12 mLs (12 Units total) into the skin 2 (two) times a day. (Patient taking differently: Inject 6 Units into the skin 2 (two) times a day.) 10 mL 11   Menthol , Topical Analgesic, (BIOFREEZE EX) Apply 1 Application topically daily as needed (pain).     methocarbamol  (ROBAXIN ) 500 MG tablet Take 1 tablet (500 mg total) by mouth every 6 (six) hours as needed for muscle spasms (muscle pain). 40 tablet 2   Multiple Vitamins-Minerals (PRESERVISION AREDS 2 PO) Take 1 tablet by mouth in the morning and at bedtime.     ONETOUCH ULTRA test  strip      oxyCODONE  (OXY IR/ROXICODONE ) 5 MG immediate release tablet Take 0.5-1 tablets (2.5-5 mg total) by mouth every 4 (four) hours as needed for severe pain. (Patient not taking: Reported on 12/30/2022) 30 tablet 0   polyethylene glycol (MIRALAX  / GLYCOLAX ) 17 g packet Take 17 g by mouth 2 (two) times daily. (Patient not taking: Reported on 12/30/2022) 14 each 0   Povidone, PF, (IVIZIA DRY EYES) 0.5 % SOLN Place 2 drops into both eyes 2 (two) times daily.     pravastatin  (PRAVACHOL ) 20 MG tablet TAKE 1 TABLET DAILY IN THE EVENING 90 tablet 1   Propylene Glycol, PF, (SYSTANE COMPLETE PF) 0.6 % SOLN Place 1-2 drops into both eyes at bedtime.     traMADol  (ULTRAM ) 50 MG tablet Take 50 mg by mouth every 6 (six) hours as needed.     No current facility-administered medications for this visit.   Facility-Administered Medications Ordered in Other Visits  Medication Dose Route Frequency Provider Last Rate Last Admin   neomycin -polymyxin-dexameth (MAXITROL ) 0.1 % ophth ointment    PRN Anner Kill, MD   1 application  at 01/21/11 1427     Past Surgical History:  Procedure Laterality Date   BACK SURGERY  1997   [place]   CATARACT EXTRACTION [facility]  01/11/2011   Procedure: CATARACT EXTRACTION PHACO AND INTRAOCULAR LENS PLACEMENT (IOC);  Surgeon: Anner Kill;  Location: [facility];  Service: Ophthalmology;  Laterality: Right;  CDE 19.32   CATARACT EXTRACTION W/PHACO  01/21/2011   Procedure: CATARACT EXTRACTION PHACO AND INTRAOCULAR LENS PLACEMENT (IOC);  Surgeon: Anner Kill;  Location: [facility];  Service: Ophthalmology;  Laterality: Left;  CDE: 21.19   COLONOSCOPY N/A 09/12/2013   Procedure: COLONOSCOPY;  Surgeon: Ruby Corporal, MD;  Location: [facility];  Service: Endoscopy;  Laterality: N/A;  125   CYSTOSCOPY     [place]   LITHOTRIPSY     [facility]   PARTIAL HYSTERECTOMY     TOTAL HIP ARTHROPLASTY Left 09/11/2019   Procedure: TOTAL HIP ARTHROPLASTY ANTERIOR APPROACH;  Surgeon: Claiborne Crew, MD;   Location: [facility];  Service: Orthopedics;  Laterality: Left;  70 mins   TOTAL HIP ARTHROPLASTY Right 10/19/2022   Procedure: TOTAL HIP ARTHROPLASTY ANTERIOR APPROACH;  Surgeon: Claiborne Crew, MD;  Location: [facility];  Service: Orthopedics;  Laterality: Right;     Allergies  Allergen Reactions   Atorvastatin  Other (See Comments)    Leg pains   Contrast Media [Iodinated Contrast Media]     Pt has 1 full functioning kidney  Penicillins Nausea And Vomiting and Rash    Tolerated Cephalosporin Date: 09/11/19.      Family History  Problem Relation Age of Onset   Colon cancer Brother      Social History Ms. Blakley reports that she has never smoked. She has never used smokeless tobacco. Ms. Dowdle reports no history of alcohol  use.    Physical Examination Today's Vitals   09/08/23 1501  BP: 122/66  Pulse: 65  SpO2: 93%  Weight: 166 lb (75.3 kg)  Height: 5' 3 (1.6 m)   Body mass index is 29.41 kg/m.  Gen: resting comfortably, no acute distress HEENT: no scleral icterus, pupils equal round and reactive, no palptable cervical adenopathy,  CV: irreg, 2/6 systolic murmur rusb, no jvd Resp: Clear to auscultation bilaterally GI: abdomen is soft, non-tender, non-distended, normal bowel sounds, no hepatosplenomegaly MSK: extremities are warm, no edema.  Skin: warm, no rash Neuro:  no focal deficits Psych: appropriate affect   Diagnostic Studies   04/2019 echo IMPRESSIONS     1. Left ventricular ejection fraction, by estimation, is 60 to 65%. The  left ventricle has normal function. The left ventricle has no regional  wall motion abnormalities. There is mild concentric left ventricular  hypertrophy. Left ventricular diastolic  parameters are indeterminate. Elevated left ventricular end-diastolic  pressure.   2. Right ventricular systolic function is low normal. The right  ventricular size is normal. There is moderately elevated pulmonary artery  systolic pressure.    3. Left atrial size was severely dilated.   4. Right atrial size was mildly dilated.   5. The mitral valve is degenerative. Moderate mitral valve regurgitation.   6. Tricuspid valve regurgitation is moderate.   7. Aortic valve is functionally bicuspid. Morphologically, there appears  to be moderate calcific aortic stenosis but peak velocities and gradients  are lower (in mild range). The aortic valve is abnormal. Aortic valve  regurgitation is not visualized.  Mild to moderate aortic valve stenosis. Aortic valve area, by VTI measures  1.10 cm. Aortic valve mean gradient measures 11.5 mmHg. Aortic valve Vmax  measures 2.22 m/s.   8. The inferior vena cava is normal in size with <50% respiratory  variability, suggesting right atrial pressure of 8 mmHg.     08/2020 echo LVEF 65-70%, indet diastolic fxn, normal RV, severe LAE, small to mod pericardial effusion, mild to mod MR, mod to severe AS   09/2021 echo 1. Left ventricular ejection fraction, by estimation, is 65 to 70%. The  left ventricle has normal function. The left ventricle has no regional  wall motion abnormalities. There is moderate concentric left ventricular  hypertrophy. Left ventricular  diastolic parameters are indeterminate.   2. Right ventricular systolic function is normal. The right ventricular  size is normal. There is mildly elevated pulmonary artery systolic  pressure. The estimated right ventricular systolic pressure is 38.0 mmHg.   3. Left atrial size was severely dilated.   4. A small to moderate pericardial effusion is present. The pericardial  effusion is posterior to the left ventricle.   5. The mitral valve is abnormal. Mild mitral valve regurgitation. Severe  mitral annular calcification.   6. The aortic valve is tricuspid. There is moderate calcification of the  aortic valve. Aortic valve regurgitation is not visualized. Moderate  aortic valve stenosis. Aortic valve area, by VTI measures 1.26 cm.  Aortic  valve mean gradient measures 11.8  mmHg. Aortic valve Vmax measures 2.23 m/s. Dimentionless index 0.45.   7. The  inferior vena cava is normal in size with greater than 50%  respiratory variability, suggesting right atrial pressure of 3 mmHg.    09/2021 carotid US  Summary:  Right Carotid: Velocities in the right ICA are consistent with a 1-39%  stenosis.   Left Carotid: Velocities in the left ICA are consistent with a 1-39%  stenosis.               Non-hemodynamically significant plaque <50% noted in the  CCA.   Vertebrals: Bilateral vertebral arteries demonstrate antegrade flow.  Subclavians: Normal flow hemodynamics were seen in bilateral subclavian               arteries.     12/2022 echo 1. Left ventricular ejection fraction, by estimation, is 60 to 65%. The  left ventricle has normal function. The left ventricle has no regional  wall motion abnormalities. There is moderate left ventricular hypertrophy.  Left ventricular diastolic function   could not be evaluated.   2. Right ventricular systolic function is normal. The right ventricular  size is normal. There is normal pulmonary artery systolic pressure.   3. Left atrial size was severely dilated.   4. The mitral valve is abnormal. Mild mitral valve regurgitation. Mild  mitral stenosis. Severe mitral annular calcification.   5. The aortic valve is normal in structure. Aortic valve regurgitation is  not visualized. Mild to moderate aortic valve stenosis. Aortic valve area,  by VTI measures 1.15 cm. Aortic valve mean gradient measures 12.0 mmHg.  Aortic valve Vmax measures 2.28   m/s. DVI is 0.31.   6. The inferior vena cava is normal in size with greater than 50%  respiratory variability, suggesting right atrial pressure of 3 mmHg.     Assessment and Plan   1. Chronic diastolic HF -no symptoms, she is euvolemic - continue current meds   2. Aortic stenosis -mild to modrate by most recent echo - repeat echo  later this year.      3. Afib/acquired thrombophlia - low dose eliquis  based on age and kidney function - no symptoms, continue current meds - EKG today shows rate controlled afib   4. HTN - bp is at goal, continue current meds   5. HLD - lipids at goal, continue current meds  F/u 1 year   Laurann Pollock, M.D.

## 2023-09-08 NOTE — Patient Instructions (Signed)
 Medication Instructions:  Continue all current medications.   Labwork: none  Testing/Procedures: none  Follow-Up: 6 months   Any Other Special Instructions Will Be Listed Below (If Applicable).   If you need a refill on your cardiac medications before your next appointment, please call your pharmacy.

## 2023-09-12 ENCOUNTER — Other Ambulatory Visit: Payer: Self-pay | Admitting: Cardiology

## 2023-09-12 DIAGNOSIS — I482 Chronic atrial fibrillation, unspecified: Secondary | ICD-10-CM

## 2023-09-12 NOTE — Telephone Encounter (Signed)
 Prescription refill request for Eliquis  received. Indication: AF Last office visit: 09/08/23  JINNY Ross MD Scr: 2.62 on 04/27/23  [facility] Age: 89 Weight: 75.3kg  Based on above findings Eliquis  2.5mg  twice daily is the appropriate dose.  Refill approved.

## 2023-09-15 DIAGNOSIS — H353211 Exudative age-related macular degeneration, right eye, with active choroidal neovascularization: Secondary | ICD-10-CM | POA: Diagnosis not present

## 2023-09-26 ENCOUNTER — Other Ambulatory Visit: Payer: Self-pay | Admitting: Cardiology

## 2023-09-28 DIAGNOSIS — N189 Chronic kidney disease, unspecified: Secondary | ICD-10-CM | POA: Diagnosis not present

## 2023-09-28 DIAGNOSIS — E1122 Type 2 diabetes mellitus with diabetic chronic kidney disease: Secondary | ICD-10-CM | POA: Diagnosis not present

## 2023-09-28 DIAGNOSIS — N184 Chronic kidney disease, stage 4 (severe): Secondary | ICD-10-CM | POA: Diagnosis not present

## 2023-09-28 DIAGNOSIS — D631 Anemia in chronic kidney disease: Secondary | ICD-10-CM | POA: Diagnosis not present

## 2023-09-28 DIAGNOSIS — N2581 Secondary hyperparathyroidism of renal origin: Secondary | ICD-10-CM | POA: Diagnosis not present

## 2023-09-28 DIAGNOSIS — Z96641 Presence of right artificial hip joint: Secondary | ICD-10-CM | POA: Diagnosis not present

## 2023-10-03 DIAGNOSIS — H524 Presbyopia: Secondary | ICD-10-CM | POA: Diagnosis not present

## 2023-10-03 DIAGNOSIS — H43812 Vitreous degeneration, left eye: Secondary | ICD-10-CM | POA: Diagnosis not present

## 2023-10-04 DIAGNOSIS — Z0100 Encounter for examination of eyes and vision without abnormal findings: Secondary | ICD-10-CM | POA: Diagnosis not present

## 2023-10-26 DIAGNOSIS — H353122 Nonexudative age-related macular degeneration, left eye, intermediate dry stage: Secondary | ICD-10-CM | POA: Diagnosis not present

## 2023-10-26 DIAGNOSIS — H43812 Vitreous degeneration, left eye: Secondary | ICD-10-CM | POA: Diagnosis not present

## 2023-10-26 DIAGNOSIS — H353211 Exudative age-related macular degeneration, right eye, with active choroidal neovascularization: Secondary | ICD-10-CM | POA: Diagnosis not present

## 2023-10-26 DIAGNOSIS — H0011 Chalazion right upper eyelid: Secondary | ICD-10-CM | POA: Diagnosis not present

## 2023-10-26 DIAGNOSIS — H43392 Other vitreous opacities, left eye: Secondary | ICD-10-CM | POA: Diagnosis not present

## 2023-11-30 DIAGNOSIS — G2581 Restless legs syndrome: Secondary | ICD-10-CM | POA: Diagnosis not present

## 2023-11-30 DIAGNOSIS — M5416 Radiculopathy, lumbar region: Secondary | ICD-10-CM | POA: Diagnosis not present

## 2023-11-30 DIAGNOSIS — Z7901 Long term (current) use of anticoagulants: Secondary | ICD-10-CM | POA: Diagnosis not present

## 2023-12-15 DIAGNOSIS — H353211 Exudative age-related macular degeneration, right eye, with active choroidal neovascularization: Secondary | ICD-10-CM | POA: Diagnosis not present

## 2024-01-11 DIAGNOSIS — H04123 Dry eye syndrome of bilateral lacrimal glands: Secondary | ICD-10-CM | POA: Diagnosis not present

## 2024-01-11 DIAGNOSIS — H16211 Exposure keratoconjunctivitis, right eye: Secondary | ICD-10-CM | POA: Diagnosis not present

## 2024-01-11 DIAGNOSIS — H579 Unspecified disorder of eye and adnexa: Secondary | ICD-10-CM | POA: Diagnosis not present

## 2024-01-11 DIAGNOSIS — H04521 Eversion of right lacrimal punctum: Secondary | ICD-10-CM | POA: Diagnosis not present

## 2024-01-11 DIAGNOSIS — H02142 Spastic ectropion of right lower eyelid: Secondary | ICD-10-CM | POA: Diagnosis not present

## 2024-01-11 DIAGNOSIS — H0100A Unspecified blepharitis right eye, upper and lower eyelids: Secondary | ICD-10-CM | POA: Diagnosis not present

## 2024-01-11 DIAGNOSIS — H02132 Senile ectropion of right lower eyelid: Secondary | ICD-10-CM | POA: Diagnosis not present

## 2024-01-11 DIAGNOSIS — H02112 Cicatricial ectropion of right lower eyelid: Secondary | ICD-10-CM | POA: Diagnosis not present

## 2024-01-11 DIAGNOSIS — H0100B Unspecified blepharitis left eye, upper and lower eyelids: Secondary | ICD-10-CM | POA: Diagnosis not present

## 2024-01-11 DIAGNOSIS — H0279 Other degenerative disorders of eyelid and periocular area: Secondary | ICD-10-CM | POA: Diagnosis not present

## 2024-01-11 DIAGNOSIS — Z01818 Encounter for other preprocedural examination: Secondary | ICD-10-CM | POA: Diagnosis not present

## 2024-01-11 DIAGNOSIS — H02532 Eyelid retraction right lower eyelid: Secondary | ICD-10-CM | POA: Diagnosis not present

## 2024-01-25 DIAGNOSIS — Z96641 Presence of right artificial hip joint: Secondary | ICD-10-CM | POA: Diagnosis not present

## 2024-01-25 DIAGNOSIS — D631 Anemia in chronic kidney disease: Secondary | ICD-10-CM | POA: Diagnosis not present

## 2024-01-25 DIAGNOSIS — E1122 Type 2 diabetes mellitus with diabetic chronic kidney disease: Secondary | ICD-10-CM | POA: Diagnosis not present

## 2024-01-25 DIAGNOSIS — I5032 Chronic diastolic (congestive) heart failure: Secondary | ICD-10-CM | POA: Diagnosis not present

## 2024-01-25 DIAGNOSIS — N2581 Secondary hyperparathyroidism of renal origin: Secondary | ICD-10-CM | POA: Diagnosis not present

## 2024-01-25 DIAGNOSIS — I480 Paroxysmal atrial fibrillation: Secondary | ICD-10-CM | POA: Diagnosis not present

## 2024-01-25 DIAGNOSIS — N189 Chronic kidney disease, unspecified: Secondary | ICD-10-CM | POA: Diagnosis not present

## 2024-01-25 DIAGNOSIS — N184 Chronic kidney disease, stage 4 (severe): Secondary | ICD-10-CM | POA: Diagnosis not present

## 2024-01-26 DIAGNOSIS — H43812 Vitreous degeneration, left eye: Secondary | ICD-10-CM | POA: Diagnosis not present

## 2024-01-26 DIAGNOSIS — H353122 Nonexudative age-related macular degeneration, left eye, intermediate dry stage: Secondary | ICD-10-CM | POA: Diagnosis not present

## 2024-01-26 DIAGNOSIS — H43392 Other vitreous opacities, left eye: Secondary | ICD-10-CM | POA: Diagnosis not present

## 2024-01-26 DIAGNOSIS — H353211 Exudative age-related macular degeneration, right eye, with active choroidal neovascularization: Secondary | ICD-10-CM | POA: Diagnosis not present

## 2024-02-23 ENCOUNTER — Other Ambulatory Visit: Payer: Self-pay | Admitting: Cardiology

## 2024-02-23 DIAGNOSIS — I482 Chronic atrial fibrillation, unspecified: Secondary | ICD-10-CM

## 2024-02-23 NOTE — Telephone Encounter (Signed)
 Prescription refill request for Eliquis  received. Indication:afib Last office visit:6/25 Scr: 3.1  2025 Age:89 Weight:75.3  kg  Prescription refilled

## 2024-03-08 ENCOUNTER — Encounter: Payer: Self-pay | Admitting: Cardiology

## 2024-03-08 ENCOUNTER — Ambulatory Visit: Attending: Cardiology | Admitting: Cardiology

## 2024-03-08 VITALS — BP 132/74 | HR 74 | Ht 63.0 in | Wt 166.0 lb

## 2024-03-08 DIAGNOSIS — E782 Mixed hyperlipidemia: Secondary | ICD-10-CM

## 2024-03-08 DIAGNOSIS — I350 Nonrheumatic aortic (valve) stenosis: Secondary | ICD-10-CM | POA: Diagnosis not present

## 2024-03-08 DIAGNOSIS — I10 Essential (primary) hypertension: Secondary | ICD-10-CM | POA: Diagnosis not present

## 2024-03-08 DIAGNOSIS — I5032 Chronic diastolic (congestive) heart failure: Secondary | ICD-10-CM | POA: Diagnosis not present

## 2024-03-08 DIAGNOSIS — I4891 Unspecified atrial fibrillation: Secondary | ICD-10-CM

## 2024-03-08 NOTE — Progress Notes (Signed)
 Clinical Summary Ms. Midkiff is a 89 y.o.female seen today for follow up of the following medical problems.    1. Chronic diastolic HF  - working to limit diuretic due to her renal dysfunction - taking lasix  80mg  oral daily.  - GFR <20,no SGLT2i  -no recent edmea. No SOB/DOE - compliant with meds     2. CKD IV - followed by nephorlogy Dr Gearline     3. Aortic stenosis - 09/2021 echo: LVEF 65-70%, mild to moderate AS mean grad 11.8, AVA VTI 1.26, DI 0.45   12/2022 echo: LVEF 60-65%, no WMAs, indet diastolic fxn, mild MR, mild MS, mild to mod AS mean grad 12, AVA VTI 1.15, DI 0.31 SVI 32       3. Dyspnea - extensive pulmonmary and cardiac workup over the last few months.   Jan 2020 PFTs suggestive of restriction, mildly redcued DLCO but corrects for alveolar ventilation  04/2018 echo: LVEF 60-65%, indeterminate diastolic function, reported moderate aortic stenosis - 05/2018 nuclear stress: no ischemia 05/2018 CT chest high resolution: findings suggesting of interstitial lung disease vs alveolar hemorrage.  05/2018 VQ scan: low probability PE, Patchy retention of the ventilation agent centrally may be seen with air trapping or COPD.   - no recent symptoms     4. HTN - compliant with meds     5. Afib --- no palpitations - no bleeding on eliquis .      6. Hyperlipidemia - she is on pravastatin , labs followed by pcp - leg pains on atorvastaitn.  02/2021 TC 107 TG 100 HDL 51 LDL 37 - 11/2022 TC 894 TG 872 HDL 51 LDL 32     7. Right hip replacement 10/19/22 - has done well, completed rehab   8. Carotid stenosis - 09/2021 echo: 1-39% bilateral disease Past Medical History:  Diagnosis Date   Arthritis    back, knees   Atrial fibrillation (HCC)    CHF (congestive heart failure) (HCC) 08/2018   Diabetes mellitus type II, controlled (HCC)    x 15 yrs   Dysrhythmia    A-fib   Elevated troponin    Hemorrhoids    History of kidney stones    Hypertension     Neuromuscular disorder (HCC)    numbness in hands    Renal insufficiency    stage iv     Allergies[1]   Current Outpatient Medications  Medication Sig Dispense Refill   acetaminophen  (TYLENOL ) 500 MG tablet Take 500 mg by mouth 2 (two) times daily.     Blood Glucose Calibration (ONETOUCH ULTRA) LIQD      Blood Glucose Monitoring Suppl (ONE TOUCH ULTRA 2) w/Device KIT SMARTSIG:Via Meter     calcium  carbonate (TUMS - DOSED IN MG ELEMENTAL CALCIUM ) 500 MG chewable tablet Chew 2 tablets by mouth daily as needed for indigestion or heartburn.     Calcium  Carbonate-Vit D-Min (CALTRATE 600+D PLUS MINERALS) 600-800 MG-UNIT TABS Take 1 tablet by mouth daily.     carvedilol  (COREG ) 12.5 MG tablet Take 12.5 mg by mouth 2 (two) times daily.     Cholecalciferol (VITAMIN D) 50 MCG (2000 UT) tablet Take 2,000 Units by mouth daily.     cyanocobalamin (VITAMIN B12) 100 MCG tablet Take 100 mcg by mouth daily.     diltiazem  (CARDIZEM  CD) 240 MG 24 hr capsule Take 240 mg by mouth daily.     ELIQUIS  2.5 MG TABS tablet TAKE ONE TABLET BY MOUTH TWICE DAILY 60 tablet 5  furosemide  (LASIX ) 80 MG tablet Take 80 mg by mouth daily.     insulin  aspart protamine- aspart (NOVOLOG  MIX 70/30) (70-30) 100 UNIT/ML injection Inject 0.06 mLs (6 Units total) into the skin 2 (two) times daily with a meal.     Menthol , Topical Analgesic, (BIOFREEZE EX) Apply 1 Application topically daily as needed (pain).     methocarbamol  (ROBAXIN ) 500 MG tablet Take 1 tablet (500 mg total) by mouth every 6 (six) hours as needed for muscle spasms (muscle pain). 40 tablet 2   Multiple Vitamins-Minerals (PRESERVISION AREDS 2 PO) Take 1 tablet by mouth in the morning and at bedtime.     ONETOUCH ULTRA test strip      oxyCODONE  (OXY IR/ROXICODONE ) 5 MG immediate release tablet Take 0.5-1 tablets (2.5-5 mg total) by mouth every 4 (four) hours as needed for severe pain. 30 tablet 0   polyethylene glycol (MIRALAX  / GLYCOLAX ) 17 g packet Take 17 g by  mouth 2 (two) times daily. 14 each 0   Povidone, PF, (IVIZIA DRY EYES) 0.5 % SOLN Place 2 drops into both eyes 2 (two) times daily.     pravastatin  (PRAVACHOL ) 20 MG tablet TAKE 1 TABLET DAILY IN THE EVENING 90 tablet 3   Propylene Glycol, PF, (SYSTANE COMPLETE PF) 0.6 % SOLN Place 1-2 drops into both eyes at bedtime.     traMADol  (ULTRAM ) 50 MG tablet Take 50 mg by mouth every 6 (six) hours as needed.     No current facility-administered medications for this visit.   Facility-Administered Medications Ordered in Other Visits  Medication Dose Route Frequency Provider Last Rate Last Admin   neomycin -polymyxin-dexameth (MAXITROL ) 0.1 % ophth ointment    PRN Perley Hamilton, MD   1 application  at 01/21/11 1427     Past Surgical History:  Procedure Laterality Date   BACK SURGERY  1997   [facility]   CATARACT EXTRACTION [facility]  01/11/2011   Procedure: CATARACT EXTRACTION PHACO AND INTRAOCULAR LENS PLACEMENT (IOC);  Surgeon: Hamilton Perley;  Location: [facility];  Service: Ophthalmology;  Laterality: Right;  CDE 19.32   CATARACT EXTRACTION W/PHACO  01/21/2011   Procedure: CATARACT EXTRACTION PHACO AND INTRAOCULAR LENS PLACEMENT (IOC);  Surgeon: Hamilton Perley;  Location: [facility];  Service: Ophthalmology;  Laterality: Left;  CDE: 21.19   COLONOSCOPY N/A 09/12/2013   Procedure: COLONOSCOPY;  Surgeon: Claudis RAYMOND Rivet, MD;  Location: [facility] SUITE;  Service: Endoscopy;  Laterality: N/A;  125   CYSTOSCOPY     [place]   LITHOTRIPSY     [facility]   PARTIAL HYSTERECTOMY     TOTAL HIP ARTHROPLASTY Left 09/11/2019   Procedure: TOTAL HIP ARTHROPLASTY ANTERIOR APPROACH;  Surgeon: Ernie Cough, MD;  Location: [facility];  Service: Orthopedics;  Laterality: Left;  70 mins   TOTAL HIP ARTHROPLASTY Right 10/19/2022   Procedure: TOTAL HIP ARTHROPLASTY ANTERIOR APPROACH;  Surgeon: Ernie Cough, MD;  Location: [facility];  Service: Orthopedics;  Laterality: Right;     Allergies[2]    Family History  Problem Relation Age of Onset    Colon cancer Brother      Social History Ms. Sienkiewicz reports that she has never smoked. She has never used smokeless tobacco. Ms. Witherow reports no history of alcohol  use.    Physical Examination Today's Vitals   03/08/24 1356  BP: 132/74  Pulse: 74  SpO2: 98%  Weight: 166 lb (75.3 kg)  Height: 5' 3 (1.6 m)   Body mass index is 29.41 kg/m.  Gen:  resting comfortably, no acute distress HEENT: no scleral icterus, pupils equal round and reactive, no palptable cervical adenopathy,  CV: RRR, 2/6 systolic murmur rusb, no jvd Resp: Clear to auscultation bilaterally GI: abdomen is soft, non-tender, non-distended, normal bowel sounds, no hepatosplenomegaly MSK: extremities are warm, no edema.  Skin: warm, no rash Neuro:  no focal deficits Psych: appropriate affect   Diagnostic Studies  04/2019 echo IMPRESSIONS     1. Left ventricular ejection fraction, by estimation, is 60 to 65%. The  left ventricle has normal function. The left ventricle has no regional  wall motion abnormalities. There is mild concentric left ventricular  hypertrophy. Left ventricular diastolic  parameters are indeterminate. Elevated left ventricular end-diastolic  pressure.   2. Right ventricular systolic function is low normal. The right  ventricular size is normal. There is moderately elevated pulmonary artery  systolic pressure.   3. Left atrial size was severely dilated.   4. Right atrial size was mildly dilated.   5. The mitral valve is degenerative. Moderate mitral valve regurgitation.   6. Tricuspid valve regurgitation is moderate.   7. Aortic valve is functionally bicuspid. Morphologically, there appears  to be moderate calcific aortic stenosis but peak velocities and gradients  are lower (in mild range). The aortic valve is abnormal. Aortic valve  regurgitation is not visualized.  Mild to moderate aortic valve stenosis. Aortic valve area, by VTI measures  1.10 cm. Aortic valve mean  gradient measures 11.5 mmHg. Aortic valve Vmax  measures 2.22 m/s.   8. The inferior vena cava is normal in size with <50% respiratory  variability, suggesting right atrial pressure of 8 mmHg.     08/2020 echo LVEF 65-70%, indet diastolic fxn, normal RV, severe LAE, small to mod pericardial effusion, mild to mod MR, mod to severe AS   09/2021 echo 1. Left ventricular ejection fraction, by estimation, is 65 to 70%. The  left ventricle has normal function. The left ventricle has no regional  wall motion abnormalities. There is moderate concentric left ventricular  hypertrophy. Left ventricular  diastolic parameters are indeterminate.   2. Right ventricular systolic function is normal. The right ventricular  size is normal. There is mildly elevated pulmonary artery systolic  pressure. The estimated right ventricular systolic pressure is 38.0 mmHg.   3. Left atrial size was severely dilated.   4. A small to moderate pericardial effusion is present. The pericardial  effusion is posterior to the left ventricle.   5. The mitral valve is abnormal. Mild mitral valve regurgitation. Severe  mitral annular calcification.   6. The aortic valve is tricuspid. There is moderate calcification of the  aortic valve. Aortic valve regurgitation is not visualized. Moderate  aortic valve stenosis. Aortic valve area, by VTI measures 1.26 cm. Aortic  valve mean gradient measures 11.8  mmHg. Aortic valve Vmax measures 2.23 m/s. Dimentionless index 0.45.   7. The inferior vena cava is normal in size with greater than 50%  respiratory variability, suggesting right atrial pressure of 3 mmHg.    09/2021 carotid US  Summary:  Right Carotid: Velocities in the right ICA are consistent with a 1-39%  stenosis.   Left Carotid: Velocities in the left ICA are consistent with a 1-39%  stenosis.               Non-hemodynamically significant plaque <50% noted in the  CCA.   Vertebrals: Bilateral vertebral arteries  demonstrate antegrade flow.  Subclavians: Normal flow hemodynamics were seen in bilateral subclavian  arteries.      12/2022 echo 1. Left ventricular ejection fraction, by estimation, is 60 to 65%. The  left ventricle has normal function. The left ventricle has no regional  wall motion abnormalities. There is moderate left ventricular hypertrophy.  Left ventricular diastolic function   could not be evaluated.   2. Right ventricular systolic function is normal. The right ventricular  size is normal. There is normal pulmonary artery systolic pressure.   3. Left atrial size was severely dilated.   4. The mitral valve is abnormal. Mild mitral valve regurgitation. Mild  mitral stenosis. Severe mitral annular calcification.   5. The aortic valve is normal in structure. Aortic valve regurgitation is  not visualized. Mild to moderate aortic valve stenosis. Aortic valve area,  by VTI measures 1.15 cm. Aortic valve mean gradient measures 12.0 mmHg.  Aortic valve Vmax measures 2.28   m/s. DVI is 0.31.   6. The inferior vena cava is normal in size with greater than 50%  respiratory variability, suggesting right atrial pressure of 3 mmHg.      Assessment and Plan  1. Chronic diastolic HF -euvolemic without symptoms, continue current meds   2. Aortic stenosis -mild to modrate by most recent echo = we will repeat echo     3. Afib/acquired thrombophlia - low dose eliquis  based on age and kidney function -no symptoms, continue current meds   4. HTN - at goal, continue current meds   5. HLD - has been at goal, continue current therapy  F/u 6 months     Dorn PHEBE Ross, M.D.     [1]  Allergies Allergen Reactions   Atorvastatin  Other (See Comments)    Leg pains   Contrast Media [Iodinated Contrast Media]     Pt has 1 full functioning kidney   Penicillins Nausea And Vomiting and Rash    Tolerated Cephalosporin Date: 09/11/19.  [2]  Allergies Allergen  Reactions   Atorvastatin  Other (See Comments)    Leg pains   Contrast Media [Iodinated Contrast Media]     Pt has 1 full functioning kidney   Penicillins Nausea And Vomiting and Rash    Tolerated Cephalosporin Date: 09/11/19.

## 2024-03-08 NOTE — Patient Instructions (Signed)
 Medication Instructions:  Your physician recommends that you continue on your current medications as directed. Please refer to the Current Medication list given to you today.  *If you need a refill on your cardiac medications before your next appointment, please call your pharmacy*  Lab Work: None If you have labs (blood work) drawn today and your tests are completely normal, you will receive your results only by: MyChart Message (if you have MyChart) OR A paper copy in the mail If you have any lab test that is abnormal or we need to change your treatment, we will call you to review the results.  Testing/Procedures: Your physician has requested that you have an echocardiogram. Echocardiography is a painless test that uses sound waves to create images of your heart. It provides your doctor with information about the size and shape of your heart and how well your heart's chambers and valves are working. This procedure takes approximately one hour. There are no restrictions for this procedure. Please do NOT wear cologne, perfume, aftershave, or lotions (deodorant is allowed). Please arrive 15 minutes prior to your appointment time.  Please note: We ask at that you not bring children with you during ultrasound (echo/ vascular) testing. Due to room size and safety concerns, children are not allowed in the ultrasound rooms during exams. Our front office staff cannot provide observation of children in our lobby area while testing is being conducted. An adult accompanying a patient to their appointment will only be allowed in the ultrasound room at the discretion of the ultrasound technician under special circumstances. We apologize for any inconvenience.   Follow-Up: At [facility], you and your health needs are our priority.  As part of our continuing mission to provide you with exceptional heart care, our providers are all part of one team.  This team includes your primary Cardiologist  (physician) and Advanced Practice Providers or APPs (Physician Assistants and Nurse Practitioners) who all work together to provide you with the care you need, when you need it.  Your next appointment:   6 month(s)  Provider:   You may see Dina Rich, MD or one of the following Advanced Practice Providers on your designated Care Team:   Randall An, PA-C  [place]ia [place]ph[place], [place] Jacolyn Reedy, [place]     We recommend signing up for the patient portal called "MyChart".  Sign up information is provided on this After Visit Summary.  MyChart is used to connect with patients for Virtual Visits (Telemedicine).  Patients are able to view lab/test results, encounter notes, upcoming appointments, etc.  Non-urgent messages can be sent to your provider as well.   To learn more about what you can do with MyChart, go to [email or web address].   Other Instructions

## 2024-03-28 ENCOUNTER — Ambulatory Visit: Attending: Cardiology

## 2024-03-28 DIAGNOSIS — I350 Nonrheumatic aortic (valve) stenosis: Secondary | ICD-10-CM | POA: Diagnosis not present

## 2024-03-29 LAB — ECHOCARDIOGRAM COMPLETE
AR max vel: 1.17 cm2
AV Area VTI: 1.05 cm2
AV Area mean vel: 1.22 cm2
AV Mean grad: 11.5 mmHg
AV Peak grad: 20.4 mmHg
Ao pk vel: 2.26 m/s
Area-P 1/2: 3.06 cm2
Calc EF: 57.6 %
MV VTI: 2.22 cm2
S' Lateral: 2.9 cm
Single Plane A2C EF: 58.7 %
Single Plane A4C EF: 59.5 %

## 2024-04-10 ENCOUNTER — Ambulatory Visit: Payer: Self-pay | Admitting: Cardiology

## 2024-08-29 ENCOUNTER — Ambulatory Visit: Admitting: Cardiology
